# Patient Record
Sex: Female | Born: 1940 | Race: Black or African American | Hispanic: No | Smoking: Former smoker
Health system: Southern US, Community
[De-identification: ages and names within clinical notes are randomized; demographics above are authoritative.]

## PROBLEM LIST (undated history)

## (undated) DIAGNOSIS — K219 Gastro-esophageal reflux disease without esophagitis: Secondary | ICD-10-CM

## (undated) DIAGNOSIS — C801 Malignant (primary) neoplasm, unspecified: Secondary | ICD-10-CM

## (undated) DIAGNOSIS — M25512 Pain in left shoulder: Secondary | ICD-10-CM

## (undated) DIAGNOSIS — I10 Essential (primary) hypertension: Secondary | ICD-10-CM

## (undated) DIAGNOSIS — E039 Hypothyroidism, unspecified: Secondary | ICD-10-CM

## (undated) DIAGNOSIS — I219 Acute myocardial infarction, unspecified: Secondary | ICD-10-CM

## (undated) DIAGNOSIS — E119 Type 2 diabetes mellitus without complications: Secondary | ICD-10-CM

## (undated) DIAGNOSIS — D649 Anemia, unspecified: Secondary | ICD-10-CM

## (undated) DIAGNOSIS — I2781 Cor pulmonale (chronic): Secondary | ICD-10-CM

## (undated) DIAGNOSIS — Z955 Presence of coronary angioplasty implant and graft: Secondary | ICD-10-CM

## (undated) DIAGNOSIS — Z85850 Personal history of malignant neoplasm of thyroid: Secondary | ICD-10-CM

## (undated) DIAGNOSIS — R911 Solitary pulmonary nodule: Secondary | ICD-10-CM

## (undated) DIAGNOSIS — E7800 Pure hypercholesterolemia, unspecified: Secondary | ICD-10-CM

## (undated) DIAGNOSIS — I2510 Atherosclerotic heart disease of native coronary artery without angina pectoris: Secondary | ICD-10-CM

## (undated) DIAGNOSIS — K2970 Gastritis, unspecified, without bleeding: Secondary | ICD-10-CM

## (undated) DIAGNOSIS — G5600 Carpal tunnel syndrome, unspecified upper limb: Secondary | ICD-10-CM

## (undated) DIAGNOSIS — D509 Iron deficiency anemia, unspecified: Secondary | ICD-10-CM

## (undated) DIAGNOSIS — H6980 Other specified disorders of Eustachian tube, unspecified ear: Secondary | ICD-10-CM

## (undated) DIAGNOSIS — R0789 Other chest pain: Secondary | ICD-10-CM

## (undated) DIAGNOSIS — I700 Atherosclerosis of aorta: Secondary | ICD-10-CM

## (undated) DIAGNOSIS — Z780 Asymptomatic menopausal state: Secondary | ICD-10-CM

## (undated) HISTORY — DX: Carpal tunnel syndrome, unspecified upper limb: G56.00

## (undated) HISTORY — PX: BREAST BIOPSY: SHX20

## (undated) HISTORY — DX: Essential (primary) hypertension: I10

## (undated) HISTORY — PX: BREAST EXCISIONAL BIOPSY: SUR124

## (undated) HISTORY — DX: Asymptomatic menopausal state: Z78.0

## (undated) HISTORY — DX: Personal history of malignant neoplasm of thyroid: Z85.850

## (undated) HISTORY — DX: Pure hypercholesterolemia, unspecified: E78.00

## (undated) HISTORY — DX: Hypothyroidism, unspecified: E03.9

## (undated) HISTORY — DX: Other specified disorders of Eustachian tube, unspecified ear: H69.80

## (undated) HISTORY — PX: CORONARY ANGIOPLASTY: SHX604

## (undated) HISTORY — DX: Malignant (primary) neoplasm, unspecified: C80.1

## (undated) HISTORY — DX: Other chest pain: R07.89

## (undated) HISTORY — DX: Anemia, unspecified: D64.9

## (undated) HISTORY — DX: Type 2 diabetes mellitus without complications: E11.9

## (undated) HISTORY — DX: Iron deficiency anemia, unspecified: D50.9

## (undated) HISTORY — PX: TUBAL LIGATION: SHX77

## (undated) HISTORY — PX: TONSILLECTOMY: SUR1361

## (undated) HISTORY — PX: ANGIOPLASTY: SHX39

## (undated) HISTORY — DX: Cor pulmonale (chronic): I27.81

## (undated) HISTORY — PX: PTCA: SHX146

## (undated) HISTORY — DX: Gastritis, unspecified, without bleeding: K29.70

---

## 1990-12-23 DIAGNOSIS — Z85850 Personal history of malignant neoplasm of thyroid: Secondary | ICD-10-CM

## 1990-12-23 HISTORY — DX: Personal history of malignant neoplasm of thyroid: Z85.850

## 1991-10-06 HISTORY — PX: THYROIDECTOMY: SHX17

## 1998-02-20 HISTORY — PX: ENDOMETRIAL BIOPSY: SHX622

## 1998-06-23 ENCOUNTER — Encounter: Admission: RE | Admit: 1998-06-23 | Discharge: 1998-06-23 | Payer: Self-pay | Admitting: Family Medicine

## 1998-06-27 ENCOUNTER — Encounter: Admission: RE | Admit: 1998-06-27 | Discharge: 1998-06-27 | Payer: Self-pay | Admitting: Family Medicine

## 1998-07-04 ENCOUNTER — Encounter: Admission: RE | Admit: 1998-07-04 | Discharge: 1998-10-02 | Payer: Self-pay | Admitting: *Deleted

## 1998-10-02 ENCOUNTER — Encounter: Admission: RE | Admit: 1998-10-02 | Discharge: 1998-10-02 | Payer: Self-pay | Admitting: Family Medicine

## 1998-11-03 ENCOUNTER — Encounter: Admission: RE | Admit: 1998-11-03 | Discharge: 1998-11-03 | Payer: Self-pay | Admitting: Family Medicine

## 1998-12-11 ENCOUNTER — Encounter: Admission: RE | Admit: 1998-12-11 | Discharge: 1998-12-11 | Payer: Self-pay | Admitting: Sports Medicine

## 1999-02-28 ENCOUNTER — Encounter: Admission: RE | Admit: 1999-02-28 | Discharge: 1999-02-28 | Payer: Self-pay | Admitting: Family Medicine

## 1999-03-05 ENCOUNTER — Encounter: Admission: RE | Admit: 1999-03-05 | Discharge: 1999-03-05 | Payer: Self-pay | Admitting: Sports Medicine

## 1999-04-04 ENCOUNTER — Encounter: Admission: RE | Admit: 1999-04-04 | Discharge: 1999-04-04 | Payer: Self-pay | Admitting: Family Medicine

## 1999-04-09 ENCOUNTER — Encounter: Admission: RE | Admit: 1999-04-09 | Discharge: 1999-04-09 | Payer: Self-pay | Admitting: Sports Medicine

## 1999-04-10 ENCOUNTER — Encounter: Admission: RE | Admit: 1999-04-10 | Discharge: 1999-04-10 | Payer: Self-pay | Admitting: Family Medicine

## 1999-05-08 ENCOUNTER — Encounter: Admission: RE | Admit: 1999-05-08 | Discharge: 1999-05-08 | Payer: Self-pay | Admitting: Sports Medicine

## 1999-06-11 ENCOUNTER — Encounter: Admission: RE | Admit: 1999-06-11 | Discharge: 1999-06-11 | Payer: Self-pay | Admitting: Family Medicine

## 1999-07-03 ENCOUNTER — Encounter: Admission: RE | Admit: 1999-07-03 | Discharge: 1999-07-03 | Payer: Self-pay | Admitting: Sports Medicine

## 1999-08-22 ENCOUNTER — Encounter: Admission: RE | Admit: 1999-08-22 | Discharge: 1999-08-22 | Payer: Self-pay | Admitting: Family Medicine

## 1999-08-25 ENCOUNTER — Inpatient Hospital Stay (HOSPITAL_COMMUNITY): Admission: EM | Admit: 1999-08-25 | Discharge: 1999-08-29 | Payer: Self-pay | Admitting: Emergency Medicine

## 1999-08-25 ENCOUNTER — Encounter: Payer: Self-pay | Admitting: Emergency Medicine

## 1999-09-06 ENCOUNTER — Encounter: Admission: RE | Admit: 1999-09-06 | Discharge: 1999-09-06 | Payer: Self-pay | Admitting: Family Medicine

## 1999-09-12 ENCOUNTER — Encounter: Admission: RE | Admit: 1999-09-12 | Discharge: 1999-09-12 | Payer: Self-pay | Admitting: Family Medicine

## 1999-09-27 ENCOUNTER — Encounter: Admission: RE | Admit: 1999-09-27 | Discharge: 1999-09-27 | Payer: Self-pay | Admitting: Family Medicine

## 1999-10-03 ENCOUNTER — Encounter: Admission: RE | Admit: 1999-10-03 | Discharge: 1999-10-03 | Payer: Self-pay | Admitting: Family Medicine

## 1999-10-08 ENCOUNTER — Encounter: Admission: RE | Admit: 1999-10-08 | Discharge: 1999-10-08 | Payer: Self-pay | Admitting: Family Medicine

## 1999-10-18 ENCOUNTER — Encounter: Admission: RE | Admit: 1999-10-18 | Discharge: 1999-10-18 | Payer: Self-pay | Admitting: Family Medicine

## 1999-10-22 ENCOUNTER — Encounter: Admission: RE | Admit: 1999-10-22 | Discharge: 1999-10-22 | Payer: Self-pay | Admitting: Family Medicine

## 1999-10-22 ENCOUNTER — Encounter: Payer: Self-pay | Admitting: Family Medicine

## 1999-11-06 ENCOUNTER — Encounter: Admission: RE | Admit: 1999-11-06 | Discharge: 1999-11-06 | Payer: Self-pay | Admitting: Sports Medicine

## 1999-12-11 ENCOUNTER — Encounter: Admission: RE | Admit: 1999-12-11 | Discharge: 1999-12-11 | Payer: Self-pay | Admitting: Sports Medicine

## 2000-01-01 ENCOUNTER — Encounter: Admission: RE | Admit: 2000-01-01 | Discharge: 2000-01-01 | Payer: Self-pay | Admitting: Sports Medicine

## 2000-01-23 ENCOUNTER — Encounter: Admission: RE | Admit: 2000-01-23 | Discharge: 2000-01-23 | Payer: Self-pay | Admitting: Family Medicine

## 2000-02-02 ENCOUNTER — Emergency Department (HOSPITAL_COMMUNITY): Admission: EM | Admit: 2000-02-02 | Discharge: 2000-02-02 | Payer: Self-pay | Admitting: Emergency Medicine

## 2000-02-02 ENCOUNTER — Encounter: Payer: Self-pay | Admitting: Emergency Medicine

## 2000-04-14 ENCOUNTER — Emergency Department (HOSPITAL_COMMUNITY): Admission: EM | Admit: 2000-04-14 | Discharge: 2000-04-14 | Payer: Self-pay | Admitting: Emergency Medicine

## 2000-04-14 ENCOUNTER — Encounter: Payer: Self-pay | Admitting: Emergency Medicine

## 2000-04-21 ENCOUNTER — Ambulatory Visit (HOSPITAL_COMMUNITY): Admission: RE | Admit: 2000-04-21 | Discharge: 2000-04-21 | Payer: Self-pay | Admitting: Sports Medicine

## 2000-04-21 ENCOUNTER — Encounter: Admission: RE | Admit: 2000-04-21 | Discharge: 2000-04-21 | Payer: Self-pay | Admitting: Sports Medicine

## 2000-06-20 ENCOUNTER — Encounter: Admission: RE | Admit: 2000-06-20 | Discharge: 2000-06-20 | Payer: Self-pay | Admitting: Family Medicine

## 2000-06-23 ENCOUNTER — Encounter: Admission: RE | Admit: 2000-06-23 | Discharge: 2000-06-23 | Payer: Self-pay | Admitting: *Deleted

## 2000-06-23 ENCOUNTER — Encounter: Payer: Self-pay | Admitting: Family Medicine

## 2000-07-28 ENCOUNTER — Encounter: Admission: RE | Admit: 2000-07-28 | Discharge: 2000-07-28 | Payer: Self-pay | Admitting: Family Medicine

## 2000-09-10 ENCOUNTER — Encounter: Admission: RE | Admit: 2000-09-10 | Discharge: 2000-09-10 | Payer: Self-pay | Admitting: Family Medicine

## 2000-09-10 ENCOUNTER — Ambulatory Visit (HOSPITAL_COMMUNITY): Admission: RE | Admit: 2000-09-10 | Discharge: 2000-09-10 | Payer: Self-pay | Admitting: Family Medicine

## 2000-09-18 ENCOUNTER — Encounter: Admission: RE | Admit: 2000-09-18 | Discharge: 2000-09-18 | Payer: Self-pay | Admitting: Family Medicine

## 2000-09-26 ENCOUNTER — Encounter: Admission: RE | Admit: 2000-09-26 | Discharge: 2000-10-22 | Payer: Self-pay | Admitting: Sports Medicine

## 2000-10-22 ENCOUNTER — Encounter: Admission: RE | Admit: 2000-10-22 | Discharge: 2000-10-22 | Payer: Self-pay | Admitting: Family Medicine

## 2000-11-16 ENCOUNTER — Encounter: Payer: Self-pay | Admitting: Emergency Medicine

## 2000-11-16 ENCOUNTER — Inpatient Hospital Stay (HOSPITAL_COMMUNITY): Admission: EM | Admit: 2000-11-16 | Discharge: 2000-11-20 | Payer: Self-pay | Admitting: Emergency Medicine

## 2000-11-26 ENCOUNTER — Encounter: Admission: RE | Admit: 2000-11-26 | Discharge: 2000-11-26 | Payer: Self-pay | Admitting: Family Medicine

## 2000-12-10 ENCOUNTER — Encounter: Payer: Self-pay | Admitting: Family Medicine

## 2000-12-10 ENCOUNTER — Inpatient Hospital Stay (HOSPITAL_COMMUNITY): Admission: EM | Admit: 2000-12-10 | Discharge: 2000-12-11 | Payer: Self-pay | Admitting: Emergency Medicine

## 2000-12-18 ENCOUNTER — Encounter: Admission: RE | Admit: 2000-12-18 | Discharge: 2000-12-18 | Payer: Self-pay | Admitting: Family Medicine

## 2001-02-16 ENCOUNTER — Encounter: Admission: RE | Admit: 2001-02-16 | Discharge: 2001-02-16 | Payer: Self-pay | Admitting: Family Medicine

## 2001-04-14 ENCOUNTER — Encounter: Admission: RE | Admit: 2001-04-14 | Discharge: 2001-04-14 | Payer: Self-pay | Admitting: Sports Medicine

## 2001-04-23 ENCOUNTER — Encounter: Admission: RE | Admit: 2001-04-23 | Discharge: 2001-04-23 | Payer: Self-pay | Admitting: Sports Medicine

## 2001-04-23 ENCOUNTER — Encounter: Payer: Self-pay | Admitting: Sports Medicine

## 2001-05-15 ENCOUNTER — Encounter: Admission: RE | Admit: 2001-05-15 | Discharge: 2001-05-15 | Payer: Self-pay | Admitting: Family Medicine

## 2001-06-02 ENCOUNTER — Encounter (HOSPITAL_COMMUNITY): Admission: RE | Admit: 2001-06-02 | Discharge: 2001-08-31 | Payer: Self-pay | Admitting: Cardiovascular Disease

## 2001-06-15 ENCOUNTER — Encounter: Admission: RE | Admit: 2001-06-15 | Discharge: 2001-06-15 | Payer: Self-pay | Admitting: Family Medicine

## 2001-06-23 ENCOUNTER — Inpatient Hospital Stay (HOSPITAL_COMMUNITY): Admission: EM | Admit: 2001-06-23 | Discharge: 2001-06-24 | Payer: Self-pay

## 2001-07-07 ENCOUNTER — Encounter: Admission: RE | Admit: 2001-07-07 | Discharge: 2001-07-07 | Payer: Self-pay | Admitting: Family Medicine

## 2001-08-07 ENCOUNTER — Encounter: Admission: RE | Admit: 2001-08-07 | Discharge: 2001-08-07 | Payer: Self-pay | Admitting: Family Medicine

## 2001-09-07 ENCOUNTER — Encounter: Admission: RE | Admit: 2001-09-07 | Discharge: 2001-09-07 | Payer: Self-pay | Admitting: Family Medicine

## 2001-12-25 ENCOUNTER — Encounter: Admission: RE | Admit: 2001-12-25 | Discharge: 2001-12-25 | Payer: Self-pay | Admitting: Family Medicine

## 2001-12-25 ENCOUNTER — Ambulatory Visit (HOSPITAL_COMMUNITY): Admission: RE | Admit: 2001-12-25 | Discharge: 2001-12-25 | Payer: Self-pay | Admitting: Family Medicine

## 2002-01-14 ENCOUNTER — Encounter: Admission: RE | Admit: 2002-01-14 | Discharge: 2002-01-14 | Payer: Self-pay | Admitting: Family Medicine

## 2002-02-15 ENCOUNTER — Encounter: Admission: RE | Admit: 2002-02-15 | Discharge: 2002-02-15 | Payer: Self-pay | Admitting: Family Medicine

## 2002-04-27 ENCOUNTER — Encounter: Admission: RE | Admit: 2002-04-27 | Discharge: 2002-04-27 | Payer: Self-pay | Admitting: Sports Medicine

## 2002-04-27 ENCOUNTER — Encounter: Payer: Self-pay | Admitting: Internal Medicine

## 2002-04-27 ENCOUNTER — Encounter: Admission: RE | Admit: 2002-04-27 | Discharge: 2002-04-27 | Payer: Self-pay | Admitting: Internal Medicine

## 2002-05-04 ENCOUNTER — Encounter: Admission: RE | Admit: 2002-05-04 | Discharge: 2002-05-04 | Payer: Self-pay | Admitting: Family Medicine

## 2002-05-27 ENCOUNTER — Encounter: Admission: RE | Admit: 2002-05-27 | Discharge: 2002-05-27 | Payer: Self-pay | Admitting: Family Medicine

## 2002-06-10 ENCOUNTER — Encounter: Admission: RE | Admit: 2002-06-10 | Discharge: 2002-06-10 | Payer: Self-pay | Admitting: Family Medicine

## 2002-06-17 ENCOUNTER — Emergency Department (HOSPITAL_COMMUNITY): Admission: EM | Admit: 2002-06-17 | Discharge: 2002-06-18 | Payer: Self-pay | Admitting: Emergency Medicine

## 2002-06-18 ENCOUNTER — Encounter: Payer: Self-pay | Admitting: Emergency Medicine

## 2002-07-07 ENCOUNTER — Encounter: Admission: RE | Admit: 2002-07-07 | Discharge: 2002-07-07 | Payer: Self-pay | Admitting: Family Medicine

## 2002-07-27 ENCOUNTER — Ambulatory Visit (HOSPITAL_COMMUNITY): Admission: RE | Admit: 2002-07-27 | Discharge: 2002-07-27 | Payer: Self-pay | Admitting: Cardiovascular Disease

## 2002-08-23 HISTORY — PX: CHOLECYSTECTOMY: SHX55

## 2002-08-26 ENCOUNTER — Encounter: Payer: Self-pay | Admitting: Surgery

## 2002-08-30 ENCOUNTER — Encounter (INDEPENDENT_AMBULATORY_CARE_PROVIDER_SITE_OTHER): Payer: Self-pay | Admitting: *Deleted

## 2002-08-30 ENCOUNTER — Ambulatory Visit (HOSPITAL_COMMUNITY): Admission: RE | Admit: 2002-08-30 | Discharge: 2002-08-31 | Payer: Self-pay | Admitting: Surgery

## 2002-09-27 ENCOUNTER — Emergency Department (HOSPITAL_COMMUNITY): Admission: EM | Admit: 2002-09-27 | Discharge: 2002-09-28 | Payer: Self-pay | Admitting: Emergency Medicine

## 2002-09-28 ENCOUNTER — Encounter: Payer: Self-pay | Admitting: Emergency Medicine

## 2002-12-30 ENCOUNTER — Encounter: Admission: RE | Admit: 2002-12-30 | Discharge: 2002-12-30 | Payer: Self-pay | Admitting: Sports Medicine

## 2003-03-06 ENCOUNTER — Inpatient Hospital Stay (HOSPITAL_COMMUNITY): Admission: EM | Admit: 2003-03-06 | Discharge: 2003-03-08 | Payer: Self-pay | Admitting: Emergency Medicine

## 2003-03-14 ENCOUNTER — Encounter: Admission: RE | Admit: 2003-03-14 | Discharge: 2003-03-14 | Payer: Self-pay | Admitting: Family Medicine

## 2003-04-15 ENCOUNTER — Encounter: Admission: RE | Admit: 2003-04-15 | Discharge: 2003-04-15 | Payer: Self-pay | Admitting: Family Medicine

## 2003-05-19 ENCOUNTER — Encounter: Admission: RE | Admit: 2003-05-19 | Discharge: 2003-05-19 | Payer: Self-pay | Admitting: Sports Medicine

## 2003-05-19 ENCOUNTER — Encounter: Payer: Self-pay | Admitting: Sports Medicine

## 2003-07-24 LAB — HM COLONOSCOPY

## 2003-08-09 ENCOUNTER — Ambulatory Visit (HOSPITAL_COMMUNITY): Admission: RE | Admit: 2003-08-09 | Discharge: 2003-08-09 | Payer: Self-pay | Admitting: Gastroenterology

## 2003-10-19 ENCOUNTER — Encounter: Admission: RE | Admit: 2003-10-19 | Discharge: 2003-10-19 | Payer: Self-pay | Admitting: Family Medicine

## 2003-11-01 ENCOUNTER — Encounter: Admission: RE | Admit: 2003-11-01 | Discharge: 2003-11-01 | Payer: Self-pay | Admitting: Sports Medicine

## 2004-05-14 ENCOUNTER — Encounter: Admission: RE | Admit: 2004-05-14 | Discharge: 2004-05-14 | Payer: Self-pay | Admitting: Family Medicine

## 2004-05-25 ENCOUNTER — Encounter: Admission: RE | Admit: 2004-05-25 | Discharge: 2004-05-25 | Payer: Self-pay | Admitting: Sports Medicine

## 2004-09-06 ENCOUNTER — Ambulatory Visit: Payer: Self-pay | Admitting: Family Medicine

## 2004-09-13 ENCOUNTER — Ambulatory Visit: Payer: Self-pay | Admitting: Family Medicine

## 2004-09-13 HISTORY — PX: BARTHOLIN GLAND CYST EXCISION: SHX565

## 2004-11-02 ENCOUNTER — Ambulatory Visit: Payer: Self-pay | Admitting: Family Medicine

## 2005-02-26 ENCOUNTER — Ambulatory Visit: Payer: Self-pay | Admitting: Family Medicine

## 2005-03-29 ENCOUNTER — Ambulatory Visit: Payer: Self-pay | Admitting: Cardiovascular Disease

## 2005-04-25 ENCOUNTER — Ambulatory Visit: Payer: Self-pay | Admitting: Family Medicine

## 2005-06-27 ENCOUNTER — Encounter (INDEPENDENT_AMBULATORY_CARE_PROVIDER_SITE_OTHER): Payer: Self-pay | Admitting: *Deleted

## 2005-06-27 LAB — CONVERTED CEMR LAB

## 2005-07-01 ENCOUNTER — Ambulatory Visit: Payer: Self-pay | Admitting: Sports Medicine

## 2005-07-15 ENCOUNTER — Encounter: Admission: RE | Admit: 2005-07-15 | Discharge: 2005-07-15 | Payer: Self-pay | Admitting: Sports Medicine

## 2005-10-07 ENCOUNTER — Ambulatory Visit: Payer: Self-pay | Admitting: Family Medicine

## 2005-12-29 ENCOUNTER — Emergency Department (HOSPITAL_COMMUNITY): Admission: EM | Admit: 2005-12-29 | Discharge: 2005-12-29 | Payer: Self-pay | Admitting: Emergency Medicine

## 2006-01-07 ENCOUNTER — Ambulatory Visit: Payer: Self-pay | Admitting: Family Medicine

## 2006-03-31 ENCOUNTER — Ambulatory Visit: Payer: Self-pay | Admitting: Cardiovascular Disease

## 2006-04-11 ENCOUNTER — Ambulatory Visit: Payer: Self-pay

## 2006-07-17 ENCOUNTER — Encounter: Admission: RE | Admit: 2006-07-17 | Discharge: 2006-07-17 | Payer: Self-pay | Admitting: Internal Medicine

## 2006-08-11 ENCOUNTER — Ambulatory Visit: Payer: Self-pay | Admitting: Family Medicine

## 2006-10-13 ENCOUNTER — Ambulatory Visit: Payer: Self-pay | Admitting: Family Medicine

## 2006-11-06 ENCOUNTER — Ambulatory Visit: Payer: Self-pay | Admitting: Sports Medicine

## 2006-11-10 ENCOUNTER — Ambulatory Visit: Payer: Self-pay | Admitting: Family Medicine

## 2006-11-19 ENCOUNTER — Encounter: Payer: Self-pay | Admitting: Cardiovascular Disease

## 2006-11-19 ENCOUNTER — Ambulatory Visit (HOSPITAL_COMMUNITY): Admission: RE | Admit: 2006-11-19 | Discharge: 2006-11-19 | Payer: Self-pay | Admitting: Sports Medicine

## 2006-11-19 ENCOUNTER — Ambulatory Visit: Payer: Self-pay | Admitting: Cardiovascular Disease

## 2006-12-10 ENCOUNTER — Ambulatory Visit: Payer: Self-pay | Admitting: Family Medicine

## 2007-02-17 ENCOUNTER — Ambulatory Visit: Payer: Self-pay | Admitting: Family Medicine

## 2007-02-17 ENCOUNTER — Encounter: Payer: Self-pay | Admitting: Family Medicine

## 2007-02-17 LAB — CONVERTED CEMR LAB
ALT: 22 units/L (ref 0–35)
AST: 17 units/L (ref 0–37)
Albumin: 4.5 g/dL (ref 3.5–5.2)
Alkaline Phosphatase: 64 units/L (ref 39–117)
BUN: 21 mg/dL (ref 6–23)
CO2: 29 meq/L (ref 19–32)
Calcium: 9.7 mg/dL (ref 8.4–10.5)
Chloride: 100 meq/L (ref 96–112)
Cholesterol: 115 mg/dL (ref 0–200)
Creatinine, Ser: 0.77 mg/dL (ref 0.40–1.20)
Glucose, Bld: 182 mg/dL — ABNORMAL HIGH (ref 70–99)
HDL: 41 mg/dL (ref >39)
LDL Cholesterol: 55 mg/dL (ref 0–99)
Potassium: 4.7 meq/L (ref 3.5–5.3)
Sodium: 141 meq/L (ref 135–145)
Total Bilirubin: 0.5 mg/dL (ref 0.3–1.2)
Total CHOL/HDL Ratio: 2.8
Total Protein: 7.3 g/dL (ref 6.0–8.3)
Triglycerides: 95 mg/dL (ref <150)
VLDL: 19 mg/dL (ref 0–40)

## 2007-02-19 DIAGNOSIS — D638 Anemia in other chronic diseases classified elsewhere: Secondary | ICD-10-CM | POA: Insufficient documentation

## 2007-02-19 DIAGNOSIS — E039 Hypothyroidism, unspecified: Secondary | ICD-10-CM | POA: Insufficient documentation

## 2007-02-19 DIAGNOSIS — I10 Essential (primary) hypertension: Secondary | ICD-10-CM | POA: Insufficient documentation

## 2007-02-19 DIAGNOSIS — I2510 Atherosclerotic heart disease of native coronary artery without angina pectoris: Secondary | ICD-10-CM

## 2007-02-19 DIAGNOSIS — I270 Primary pulmonary hypertension: Secondary | ICD-10-CM | POA: Insufficient documentation

## 2007-02-19 DIAGNOSIS — D649 Anemia, unspecified: Secondary | ICD-10-CM

## 2007-02-19 DIAGNOSIS — E1129 Type 2 diabetes mellitus with other diabetic kidney complication: Secondary | ICD-10-CM | POA: Insufficient documentation

## 2007-02-19 DIAGNOSIS — G5600 Carpal tunnel syndrome, unspecified upper limb: Secondary | ICD-10-CM

## 2007-02-19 DIAGNOSIS — E7800 Pure hypercholesterolemia, unspecified: Secondary | ICD-10-CM

## 2007-02-19 DIAGNOSIS — E119 Type 2 diabetes mellitus without complications: Secondary | ICD-10-CM

## 2007-02-19 HISTORY — DX: Carpal tunnel syndrome, unspecified upper limb: G56.00

## 2007-02-19 HISTORY — DX: Hypothyroidism, unspecified: E03.9

## 2007-02-20 ENCOUNTER — Ambulatory Visit: Payer: Self-pay | Admitting: Family Medicine

## 2007-02-20 ENCOUNTER — Encounter (INDEPENDENT_AMBULATORY_CARE_PROVIDER_SITE_OTHER): Payer: Self-pay | Admitting: *Deleted

## 2007-04-01 ENCOUNTER — Ambulatory Visit: Payer: Self-pay | Admitting: Cardiovascular Disease

## 2007-04-02 ENCOUNTER — Encounter: Payer: Self-pay | Admitting: Family Medicine

## 2007-04-21 ENCOUNTER — Ambulatory Visit: Payer: Self-pay

## 2007-08-05 ENCOUNTER — Encounter: Admission: RE | Admit: 2007-08-05 | Discharge: 2007-08-05 | Payer: Self-pay | Admitting: Internal Medicine

## 2007-08-11 ENCOUNTER — Telehealth (INDEPENDENT_AMBULATORY_CARE_PROVIDER_SITE_OTHER): Payer: Self-pay | Admitting: *Deleted

## 2007-08-14 ENCOUNTER — Encounter: Payer: Self-pay | Admitting: *Deleted

## 2007-09-04 ENCOUNTER — Ambulatory Visit: Payer: Self-pay | Admitting: Family Medicine

## 2007-09-04 ENCOUNTER — Encounter (INDEPENDENT_AMBULATORY_CARE_PROVIDER_SITE_OTHER): Payer: Self-pay | Admitting: *Deleted

## 2007-09-04 LAB — CONVERTED CEMR LAB
Bilirubin Urine: NEGATIVE
Glucose, Urine, Semiquant: NEGATIVE
Hgb A1c MFr Bld: 7.5 %
Ketones, urine, test strip: NEGATIVE
Nitrite: NEGATIVE
Specific Gravity, Urine: >=1.03
Urobilinogen, UA: 0.2
pH: 5.5

## 2007-09-08 ENCOUNTER — Encounter (INDEPENDENT_AMBULATORY_CARE_PROVIDER_SITE_OTHER): Payer: Self-pay | Admitting: *Deleted

## 2007-09-08 LAB — CONVERTED CEMR LAB
Basophils Absolute: 0 10*3/uL (ref 0.0–0.1)
Basophils Relative: 1 % (ref 0–1)
Eosinophils Absolute: 0.2 10*3/uL (ref 0.0–0.7)
Eosinophils Relative: 3 % (ref 0–5)
HCT: 36.6 % (ref 36.0–46.0)
Hemoglobin: 11.3 g/dL — ABNORMAL LOW (ref 12.0–15.0)
Lymphocytes Relative: 41 % (ref 12–46)
Lymphs Abs: 2.7 10*3/uL (ref 0.7–3.3)
MCHC: 30.9 g/dL (ref 30.0–36.0)
MCV: 82.1 fL (ref 78.0–100.0)
Monocytes Absolute: 0.5 10*3/uL (ref 0.2–0.7)
Monocytes Relative: 7 % (ref 3–11)
Neutro Abs: 3.2 10*3/uL (ref 1.7–7.7)
Neutrophils Relative %: 49 % (ref 43–77)
Platelets: 197 10*3/uL (ref 150–400)
RBC: 4.46 M/uL (ref 3.87–5.11)
RDW: 16.1 % — ABNORMAL HIGH (ref 11.5–14.0)
TSH: 0.324 microintl units/mL — ABNORMAL LOW (ref 0.350–5.50)
WBC: 6.6 10*3/uL (ref 4.0–10.5)

## 2007-09-10 ENCOUNTER — Encounter: Payer: Self-pay | Admitting: Family Medicine

## 2007-09-11 ENCOUNTER — Encounter: Payer: Self-pay | Admitting: Family Medicine

## 2007-10-08 ENCOUNTER — Ambulatory Visit: Payer: Self-pay | Admitting: Family Medicine

## 2007-10-22 ENCOUNTER — Ambulatory Visit: Payer: Self-pay | Admitting: Family Medicine

## 2007-11-18 ENCOUNTER — Ambulatory Visit: Payer: Self-pay | Admitting: Family Medicine

## 2007-11-19 ENCOUNTER — Encounter (INDEPENDENT_AMBULATORY_CARE_PROVIDER_SITE_OTHER): Payer: Self-pay | Admitting: *Deleted

## 2007-12-03 ENCOUNTER — Ambulatory Visit: Payer: Self-pay | Admitting: Family Medicine

## 2007-12-03 ENCOUNTER — Encounter (INDEPENDENT_AMBULATORY_CARE_PROVIDER_SITE_OTHER): Payer: Self-pay | Admitting: *Deleted

## 2007-12-03 LAB — CONVERTED CEMR LAB: TSH: 1.056 microintl units/mL (ref 0.350–5.50)

## 2007-12-04 ENCOUNTER — Encounter (INDEPENDENT_AMBULATORY_CARE_PROVIDER_SITE_OTHER): Payer: Self-pay | Admitting: *Deleted

## 2007-12-21 ENCOUNTER — Emergency Department (HOSPITAL_COMMUNITY): Admission: EM | Admit: 2007-12-21 | Discharge: 2007-12-21 | Payer: Self-pay | Admitting: Emergency Medicine

## 2008-01-18 ENCOUNTER — Encounter (INDEPENDENT_AMBULATORY_CARE_PROVIDER_SITE_OTHER): Payer: Self-pay | Admitting: *Deleted

## 2008-01-18 ENCOUNTER — Ambulatory Visit: Payer: Self-pay | Admitting: Family Medicine

## 2008-01-18 LAB — CONVERTED CEMR LAB
Hgb A1c MFr Bld: 8.2 %
Pap Smear: NORMAL

## 2008-01-18 LAB — HM PAP SMEAR

## 2008-01-20 ENCOUNTER — Encounter (INDEPENDENT_AMBULATORY_CARE_PROVIDER_SITE_OTHER): Payer: Self-pay | Admitting: *Deleted

## 2008-01-22 ENCOUNTER — Emergency Department (HOSPITAL_COMMUNITY): Admission: EM | Admit: 2008-01-22 | Discharge: 2008-01-22 | Payer: Self-pay | Admitting: Family Medicine

## 2008-06-16 ENCOUNTER — Telehealth: Payer: Self-pay | Admitting: *Deleted

## 2008-06-16 ENCOUNTER — Ambulatory Visit: Payer: Self-pay | Admitting: Family Medicine

## 2008-06-20 ENCOUNTER — Telehealth: Payer: Self-pay | Admitting: Sports Medicine

## 2008-06-20 ENCOUNTER — Telehealth: Payer: Self-pay | Admitting: *Deleted

## 2008-06-21 ENCOUNTER — Ambulatory Visit: Payer: Self-pay | Admitting: Family Medicine

## 2008-06-27 ENCOUNTER — Telehealth: Payer: Self-pay | Admitting: *Deleted

## 2008-06-27 ENCOUNTER — Ambulatory Visit: Payer: Self-pay | Admitting: Family Medicine

## 2008-06-29 ENCOUNTER — Ambulatory Visit: Payer: Self-pay | Admitting: Family Medicine

## 2008-09-12 ENCOUNTER — Encounter (INDEPENDENT_AMBULATORY_CARE_PROVIDER_SITE_OTHER): Payer: Self-pay | Admitting: *Deleted

## 2008-10-07 LAB — HM DIABETES EYE EXAM: HM Diabetic Eye Exam: NORMAL

## 2008-10-11 ENCOUNTER — Ambulatory Visit: Payer: Self-pay | Admitting: Family Medicine

## 2008-10-11 ENCOUNTER — Encounter: Admission: RE | Admit: 2008-10-11 | Discharge: 2008-10-11 | Payer: Self-pay | Admitting: Sports Medicine

## 2008-10-11 ENCOUNTER — Encounter: Payer: Self-pay | Admitting: Sports Medicine

## 2008-10-11 DIAGNOSIS — Z780 Asymptomatic menopausal state: Secondary | ICD-10-CM | POA: Insufficient documentation

## 2008-10-11 LAB — CONVERTED CEMR LAB
BUN: 16 mg/dL (ref 6–23)
CO2: 30 meq/L (ref 19–32)
Calcium: 9.5 mg/dL (ref 8.4–10.5)
Chloride: 102 meq/L (ref 96–112)
Creatinine, Ser: 0.68 mg/dL (ref 0.40–1.20)
Glucose, Bld: 115 mg/dL — ABNORMAL HIGH (ref 70–99)
Hgb A1c MFr Bld: 7.8 %
Potassium: 3.6 meq/L (ref 3.5–5.3)
Sodium: 143 meq/L (ref 135–145)

## 2008-12-12 ENCOUNTER — Encounter: Payer: Self-pay | Admitting: Sports Medicine

## 2008-12-19 ENCOUNTER — Encounter: Payer: Self-pay | Admitting: Sports Medicine

## 2008-12-19 ENCOUNTER — Ambulatory Visit: Payer: Self-pay | Admitting: Family Medicine

## 2008-12-20 LAB — CONVERTED CEMR LAB
Cholesterol: 132 mg/dL (ref 0–200)
HDL: 50 mg/dL (ref >39)
LDL Cholesterol: 65 mg/dL (ref 0–99)
Total CHOL/HDL Ratio: 2.6
Triglycerides: 83 mg/dL (ref <150)
VLDL: 17 mg/dL (ref 0–40)

## 2009-01-10 ENCOUNTER — Ambulatory Visit: Payer: Self-pay | Admitting: Family Medicine

## 2009-01-10 LAB — CONVERTED CEMR LAB: Hgb A1c MFr Bld: 7.8 %

## 2009-03-13 ENCOUNTER — Inpatient Hospital Stay (HOSPITAL_COMMUNITY): Admission: EM | Admit: 2009-03-13 | Discharge: 2009-03-16 | Payer: Self-pay | Admitting: Emergency Medicine

## 2009-03-13 ENCOUNTER — Ambulatory Visit: Payer: Self-pay | Admitting: *Deleted

## 2009-03-15 HISTORY — PX: CARDIAC CATHETERIZATION: SHX172

## 2009-03-23 ENCOUNTER — Encounter (HOSPITAL_COMMUNITY): Admission: RE | Admit: 2009-03-23 | Discharge: 2009-06-20 | Payer: Self-pay | Admitting: Cardiovascular Disease

## 2009-03-24 ENCOUNTER — Telehealth: Payer: Self-pay | Admitting: Family Medicine

## 2009-03-24 ENCOUNTER — Ambulatory Visit: Payer: Self-pay | Admitting: Family Medicine

## 2009-03-24 ENCOUNTER — Encounter (INDEPENDENT_AMBULATORY_CARE_PROVIDER_SITE_OTHER): Payer: Self-pay | Admitting: Family Medicine

## 2009-03-24 LAB — CONVERTED CEMR LAB
Bilirubin Urine: NEGATIVE
Glucose, Urine, Semiquant: NEGATIVE
Ketones, urine, test strip: NEGATIVE
Specific Gravity, Urine: 1.015
pH: 5.5

## 2009-03-31 ENCOUNTER — Encounter: Payer: Self-pay | Admitting: Cardiovascular Disease

## 2009-03-31 ENCOUNTER — Ambulatory Visit: Payer: Self-pay | Admitting: Cardiovascular Disease

## 2009-04-03 ENCOUNTER — Ambulatory Visit: Payer: Self-pay | Admitting: Family Medicine

## 2009-04-03 LAB — CONVERTED CEMR LAB: Hgb A1c MFr Bld: 7.2 %

## 2009-04-20 ENCOUNTER — Ambulatory Visit: Payer: Self-pay | Admitting: Family Medicine

## 2009-04-25 ENCOUNTER — Ambulatory Visit: Payer: Self-pay | Admitting: Family Medicine

## 2009-05-15 ENCOUNTER — Encounter: Payer: Self-pay | Admitting: Cardiovascular Disease

## 2009-07-05 ENCOUNTER — Encounter: Payer: Self-pay | Admitting: Sports Medicine

## 2009-07-05 ENCOUNTER — Ambulatory Visit: Payer: Self-pay | Admitting: Family Medicine

## 2009-07-05 ENCOUNTER — Ambulatory Visit: Payer: Self-pay | Admitting: Cardiovascular Disease

## 2009-07-05 LAB — CONVERTED CEMR LAB
HCT: 32 % — ABNORMAL LOW (ref 36.0–46.0)
Hemoglobin: 10.4 g/dL — ABNORMAL LOW (ref 12.0–15.0)
Hgb A1c MFr Bld: 8 %
MCHC: 32.5 g/dL (ref 30.0–36.0)
MCV: 80.4 fL (ref 78.0–100.0)
Platelets: 152 10*3/uL (ref 150–400)
RBC: 3.98 M/uL (ref 3.87–5.11)
RDW: 16 % — ABNORMAL HIGH (ref 11.5–15.5)
WBC: 5.2 10*3/uL (ref 4.0–10.5)

## 2009-07-14 ENCOUNTER — Ambulatory Visit: Payer: Self-pay | Admitting: Family Medicine

## 2009-07-17 ENCOUNTER — Encounter: Payer: Self-pay | Admitting: Sports Medicine

## 2009-07-17 ENCOUNTER — Ambulatory Visit: Payer: Self-pay | Admitting: Family Medicine

## 2009-07-18 ENCOUNTER — Encounter: Payer: Self-pay | Admitting: Sports Medicine

## 2009-07-18 LAB — CONVERTED CEMR LAB
HCT: 33.8 % — ABNORMAL LOW (ref 36.0–46.0)
Hemoglobin: 10.7 g/dL — ABNORMAL LOW (ref 12.0–15.0)
Iron: 75 ug/dL (ref 42–145)
MCHC: 31.7 g/dL (ref 30.0–36.0)
MCV: 83 fL (ref 78.0–100.0)
Platelets: 147 K/uL — ABNORMAL LOW (ref 150–400)
RBC: 4.07 M/uL (ref 3.87–5.11)
RDW: 16 % — ABNORMAL HIGH (ref 11.5–15.5)
Retic Ct Pct: 0.8 % (ref 0.4–3.1)
Saturation Ratios: 23 % (ref 20–55)
TIBC: 333 ug/dL (ref 250–470)
UIBC: 258 ug/dL
WBC: 4.8 10*3/microliter (ref 4.0–10.5)

## 2009-07-24 ENCOUNTER — Ambulatory Visit: Payer: Self-pay | Admitting: Family Medicine

## 2009-07-25 ENCOUNTER — Ambulatory Visit: Payer: Self-pay | Admitting: Family Medicine

## 2009-07-26 ENCOUNTER — Ambulatory Visit: Payer: Self-pay | Admitting: Family Medicine

## 2009-07-27 ENCOUNTER — Ambulatory Visit: Payer: Self-pay | Admitting: Family Medicine

## 2009-08-01 ENCOUNTER — Ambulatory Visit: Payer: Self-pay | Admitting: Family Medicine

## 2009-08-02 ENCOUNTER — Ambulatory Visit: Payer: Self-pay | Admitting: Family Medicine

## 2009-08-04 ENCOUNTER — Telehealth: Payer: Self-pay | Admitting: *Deleted

## 2009-09-28 ENCOUNTER — Ambulatory Visit: Payer: Self-pay | Admitting: Family Medicine

## 2009-09-28 ENCOUNTER — Encounter: Payer: Self-pay | Admitting: Sports Medicine

## 2009-09-28 LAB — CONVERTED CEMR LAB
HCT: 32.9 % — ABNORMAL LOW (ref 36.0–46.0)
Hemoglobin: 10 g/dL — ABNORMAL LOW (ref 12.0–15.0)
Hgb A1c MFr Bld: 8.2 %
MCHC: 30.4 g/dL (ref 30.0–36.0)
MCV: 84.8 fL (ref 78.0–100.0)
Platelets: 138 10*3/uL — ABNORMAL LOW (ref 150–400)
RBC: 3.88 M/uL (ref 3.87–5.11)
RDW: 15.3 % (ref 11.5–15.5)
WBC: 5.8 10*3/uL (ref 4.0–10.5)

## 2009-10-02 ENCOUNTER — Ambulatory Visit: Payer: Self-pay | Admitting: Family Medicine

## 2009-10-12 ENCOUNTER — Encounter: Admission: RE | Admit: 2009-10-12 | Discharge: 2009-10-12 | Payer: Self-pay | Admitting: Sports Medicine

## 2009-11-02 ENCOUNTER — Ambulatory Visit: Payer: Self-pay | Admitting: Family Medicine

## 2009-11-03 ENCOUNTER — Encounter (INDEPENDENT_AMBULATORY_CARE_PROVIDER_SITE_OTHER): Payer: Self-pay | Admitting: *Deleted

## 2009-11-17 ENCOUNTER — Telehealth: Payer: Self-pay | Admitting: Family Medicine

## 2009-12-06 ENCOUNTER — Telehealth: Payer: Self-pay | Admitting: Sports Medicine

## 2009-12-08 ENCOUNTER — Ambulatory Visit: Payer: Self-pay | Admitting: Family Medicine

## 2009-12-27 ENCOUNTER — Ambulatory Visit: Payer: Self-pay | Admitting: Cardiovascular Disease

## 2010-01-05 ENCOUNTER — Ambulatory Visit: Payer: Self-pay | Admitting: Cardiovascular Disease

## 2010-01-05 ENCOUNTER — Ambulatory Visit: Payer: Self-pay | Admitting: Family Medicine

## 2010-01-09 LAB — CONVERTED CEMR LAB
Free T4: 0.8 ng/dL (ref 0.6–1.6)
Hgb A1c MFr Bld: 8.2 % — ABNORMAL HIGH (ref 4.6–6.5)
TSH: 0.46 microintl units/mL (ref 0.35–5.50)

## 2010-01-10 ENCOUNTER — Encounter (INDEPENDENT_AMBULATORY_CARE_PROVIDER_SITE_OTHER): Payer: Self-pay | Admitting: *Deleted

## 2010-01-10 LAB — CONVERTED CEMR LAB
AST: 20 units/L (ref 0–37)
Albumin: 4.4 g/dL (ref 3.5–5.2)
Bilirubin, Direct: <0.1 mg/dL (ref 0.0–0.3)
CO2: 23 meq/L (ref 19–32)
Chloride: 101 meq/L (ref 96–112)
Glucose, Bld: 141 mg/dL — ABNORMAL HIGH (ref 70–99)
HDL: 47 mg/dL (ref >39)
LDL Cholesterol: 72 mg/dL (ref 0–99)
Potassium: 4.3 meq/L (ref 3.5–5.3)
Sodium: 142 meq/L (ref 135–145)
Total Bilirubin: 0.5 mg/dL (ref 0.3–1.2)
Total CHOL/HDL Ratio: 2.9
VLDL: 18 mg/dL (ref 0–40)

## 2010-02-08 ENCOUNTER — Ambulatory Visit: Payer: Self-pay | Admitting: Family Medicine

## 2010-03-07 ENCOUNTER — Ambulatory Visit: Payer: Self-pay | Admitting: Family Medicine

## 2010-03-08 ENCOUNTER — Telehealth: Payer: Self-pay | Admitting: Sports Medicine

## 2010-03-08 ENCOUNTER — Encounter: Payer: Self-pay | Admitting: Sports Medicine

## 2010-03-28 ENCOUNTER — Encounter: Payer: Self-pay | Admitting: Sports Medicine

## 2010-03-28 ENCOUNTER — Ambulatory Visit: Payer: Self-pay | Admitting: Family Medicine

## 2010-03-28 LAB — CONVERTED CEMR LAB
Albumin/Creatinine Ratio, Urine, POC: <30
Creatinine,U: 100 mg/dL
HCT: 36.9 % (ref 36.0–46.0)
Hemoglobin: 11.3 g/dL — ABNORMAL LOW (ref 12.0–15.0)
MCHC: 30.6 g/dL (ref 30.0–36.0)
MCV: 84.1 fL (ref 78.0–100.0)
Microalbumin U total vol: 30 mg/L
Platelets: 202 K/uL (ref 150–400)
RBC: 4.39 M/uL (ref 3.87–5.11)
RDW: 15.7 % — ABNORMAL HIGH (ref 11.5–15.5)
WBC: 7.3 10*3/microliter (ref 4.0–10.5)

## 2010-03-28 LAB — HM DIABETES FOOT EXAM: HM Diabetic Foot Exam: NORMAL

## 2010-03-29 ENCOUNTER — Encounter: Payer: Self-pay | Admitting: Sports Medicine

## 2010-04-09 ENCOUNTER — Encounter: Payer: Self-pay | Admitting: Sports Medicine

## 2010-05-08 ENCOUNTER — Telehealth: Payer: Self-pay | Admitting: Sports Medicine

## 2010-06-28 ENCOUNTER — Ambulatory Visit: Payer: Self-pay | Admitting: Cardiovascular Disease

## 2010-07-11 ENCOUNTER — Encounter: Payer: Self-pay | Admitting: Sports Medicine

## 2010-07-11 ENCOUNTER — Ambulatory Visit: Payer: Self-pay | Admitting: Family Medicine

## 2010-09-28 ENCOUNTER — Emergency Department (HOSPITAL_COMMUNITY)
Admission: EM | Admit: 2010-09-28 | Discharge: 2010-09-28 | Payer: Self-pay | Source: Home / Self Care | Admitting: Internal Medicine

## 2010-10-15 ENCOUNTER — Encounter: Admission: RE | Admit: 2010-10-15 | Discharge: 2010-10-15 | Payer: Self-pay | Admitting: Sports Medicine

## 2010-10-15 LAB — HM MAMMOGRAPHY

## 2010-10-22 ENCOUNTER — Ambulatory Visit: Payer: Self-pay | Admitting: Family Medicine

## 2010-10-23 ENCOUNTER — Telehealth: Payer: Self-pay | Admitting: *Deleted

## 2010-10-29 ENCOUNTER — Encounter: Payer: Self-pay | Admitting: Sports Medicine

## 2010-12-24 ENCOUNTER — Telehealth: Payer: Self-pay | Admitting: Family Medicine

## 2010-12-24 ENCOUNTER — Emergency Department (HOSPITAL_COMMUNITY)
Admission: EM | Admit: 2010-12-24 | Discharge: 2010-12-24 | Payer: Self-pay | Source: Home / Self Care | Admitting: Family Medicine

## 2010-12-31 ENCOUNTER — Encounter: Payer: Self-pay | Admitting: *Deleted

## 2011-01-03 ENCOUNTER — Telehealth: Payer: Self-pay | Admitting: *Deleted

## 2011-01-22 NOTE — Progress Notes (Signed)
Summary: Triage   Phone Note Call from Patient Call back at [phone]   Reason for Call: Talk to Nurse Summary of Call: having problems with allergies Initial call taken by: Knox Royalty,  March 08, 2010 11:07 AM  Follow-up for Phone Call        has head congestion. started 3-4 days ago. not taking any meds. has tried claritin before which worked. has appt monday & unable to come in. told her to buy OTC claritin since it has worked for her before. she agreed with plan Follow-up by: Golden Circle RN,  March 08, 2010 11:33 AM  Additional Follow-up for Phone Call Additional follow up Details #1::        Noted, thanks. Additional Follow-up by: Rodney Langton MD,  March 08, 2010 12:34 PM

## 2011-01-22 NOTE — Assessment & Plan Note (Signed)
 Summary: F/U VISIT/[facility]   Vital Signs:  Patient Profile:   70 Years Old Female Height:     63 inches Weight:      171.7 pounds Temp:     99.0 degrees F oral Pulse rate:   75 / minute BP sitting:   139 / 67  (left arm)  Vitals Entered By: LETITIA REUSING (January 10, 2009 2:00 PM)             Is Patient Diabetic? Yes     PCP:  DEBBY PETTIES MD  Chief Complaint:  F/u DM.  History of Present Illness: 87F with HTN, HLD, DM2 here for fu of chronic medical issues.  No complaints, worried about her granddaughter Rosella Claude who is in the hospital at this time.  Otherwise ok, checking CBGs two times a day, they usually run 97-123.  No HA, dizziness, N/V/D/C, CP, SOB, fevers/chills.     Current Allergies: SEPTRA DS (SULFAMETHOXAZOLE-TRIMETHOPRIM) CODEINE  Past Medical History:    Reviewed history from 02/19/2007 and no changes required:       Cath 11/01 Angioplasty PDA 90-40%, Former chewing tobacco use - quit 9/95, Hx thyroid  CA, s/p thyroid  surg, with hypothyroidi, LFTs 18/20 04/2005, NQWMI 11/01, Postmenopausal bleeding 3/99 with neg bx, TSH wnl 04/2005  Past Surgical History:    Reviewed history from 02/19/2007 and no changes required:       Barium Enema -, bilateral tubal ligation -, breast biopsy -, Cardiac Cath, PTCA 11/01 50% LAD, PDA 90 - 30% - 10/23/2000, Cardiac Cath: EF 60%; RCA 40%,LAD30%,L main 20% - 03/15/2003, cardiolyte - neg for ischemia EF 83% - 05/23/2001, Cholecystectomy - 08/23/2002, Colonoscopy: WNL by Dyane - 08/11/2003, echo- ef 55% - 12/19/2006, Endometrial Biopsy 3/99 - neg -, Hgb 10.8 - 04/22/2004, I&D, bartholin`s cyst -, lesion removal: sebb. 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 Lungs are clear to auscultation, no crackles or wheezes. Heart:     Normal rate and regular rhythm. S1 and S2 normal without gallop, murmur, click, rub or other extra sounds. Abdomen:     Bowel sounds positive,abdomen soft and non-tender without masses, organomegaly or hernias noted. Skin:     Intact without suspicious lesions or rashes    Impression & Recommendations:  Problem # 1:  HYPERTENSION, BENIGN SYSTEMIC (ICD-401.1) Pressure slightly elevated today, only systolic, diastolic WNL, likely due to pt's concern for her hospitalized granddaughter.  Will check again at next visit, if still elevated, will change meds.  Otherwise no changes.  Emphasized low-sodium diet.  Her  updated medication list for this problem includes:    Norvasc  10 Mg Tabs (Amlodipine  besylate) ..... One tab by mouth daily    Lisinopril -hydrochlorothiazide  20-12.5 Mg Tabs (Lisinopril -hydrochlorothiazide ) .SABRA..SABRA Two tabs once daily    Coreg  25 Mg Tabs (Carvedilol ) ..... One tab by mouth bid    Cardura 1 Mg Tabs (Doxazosin mesylate) ..... One tab by mouth daily  Orders: [facility]- Est Level  3 (00786)   Problem # 2:  DIABETES MELLITUS II, UNCOMPLICATED (ICD-250.00) Well controlled, pt very compliant with meds, HBA1c 7.8 today.  No changes needed.  Recheck HbA1c in 3 months.  Her updated medication list for this  problem includes:    Bayer Childrens Aspirin  81 Mg Chew (Aspirin ) .SABRA... Take 1 tablet by mouth once a day    Lantus  100 Unit/ml Soln (Insulin  glargine) ..... Inject 8 unit subcutaneously each morning. disp: 500units    Metformin  Hcl 1000 Mg Tabs (Metformin  hcl) .SABRA... Take 1 tablet by mouth two times a day    Lisinopril -hydrochlorothiazide  20-12.5 Mg Tabs (Lisinopril -hydrochlorothiazide ) .SABRA..SABRA Two tabs once daily    Glipizide Xl 10 Mg Tb24 (Glipizide) .SABRA... 1 tablet by mouth daily  Orders: [facility]- Est Level  3 (99213)   Problem # 3:  HYPERCHOLESTEROLEMIA (ICD-272.0) Last lipid panel extremely well controlled.  No changes at this time.  Her updated medication list for this problem includes:    Zocor  80 Mg Tabs (Simvastatin ) ..... One tab by mouth daily  Orders: [facility]- Est Level  3 (00786)   Complete Medication List: 1)  Bayer Childrens Aspirin  81 Mg Chew (Aspirin ) .... Take 1 tablet by mouth once a day 2)  Imdur  60 Mg Tb24 (Isosorbide  mononitrate) .... One tab by mouth daily 3)  Lantus  100 Unit/ml Soln (Insulin  glargine) .... Inject 8 unit subcutaneously each morning. disp: 500units 4)  Metformin  Hcl 1000 Mg Tabs (Metformin  hcl) .... Take 1 tablet by mouth two times a day 5)  Norvasc  10 Mg Tabs (Amlodipine  besylate) .... One tab by mouth daily 6)  Lisinopril -hydrochlorothiazide  20-12.5 Mg Tabs (Lisinopril -hydrochlorothiazide ) .... Two tabs once daily 7)  Synthroid  50 Mcg Tabs (Levothyroxine  sodium) .... One tab by mouth daily 8)  Zocor  80 Mg Tabs (Simvastatin ) .... One tab by mouth daily 9)  Coreg  25 Mg Tabs (Carvedilol ) .... One tab by mouth bid 10)  Aimsco Insulin  Syringe 31g X 5/16 0.3 Ml Misc (Insulin  syringe-needle u-100) .SABRA.. 100 use as directed with insulin  11)  Cardura 1 Mg Tabs (Doxazosin mesylate) .... One tab by mouth daily 12)  Glipizide Xl 10 Mg Tb24 (Glipizide) .SABRA.. 1 tablet by mouth daily 13)  Hydromet 5-1.5 Mg/5ml Syrp (Hydrocodone -homatropine) .... One teaspoonful qid as  needed for cough, 120 cc 14)  Tessalon Perles 100 Mg Caps (Benzonatate) .... One to two by mouth three times a day as needed for cough 15)  Flonase  50 Mcg/act Susp (Fluticasone  propionate) .... 2 sprays each nostril daily 16)  Flexeril  5 Mg Tabs (Cyclobenzaprine  hcl) .SABRA.. 1 tab by mouth every 8 hours as needed for pain.  this will make you sleepy- do not drive.  Other Orders: A1C-[facility] ([id number])   Patient Instructions: 1)  Nice to see you today, 2)  Your blood pressure was a little high today, this is only one episode and I know you are stressed out, but if it is still high at the next visit I will have to change your meds. 3)  Your A1C was 7.8 today.  That is ok and I won't change your diabetes  meds today, good job.  Make sure to continue to eat healthy. 4)  You cholesterol was fantastic, good job! 5)  Come back to see me again in 3 months. 6)  -Dr. ONEIDA.    Laboratory Results   Blood Tests   Date/Time Received: January 10, 2009 2:08 PM  Date/Time Reported: January 10, 2009 2:44 PM   HGBA1C: 7.8%   (Normal Range: Non-Diabetic - 3-6%   Control Diabetic - 6-8%)  Comments: ..........test performed by.....SABRASABRADeseree Blount (SMA)                 confirmed by...SABRASABRASABRA Bonnie A. Jordan, [place] (ASCP)

## 2011-01-22 NOTE — Assessment & Plan Note (Signed)
Summary: B12 inj,tcb   Nurse Visit   Allergies: 1)  Septra Ds (Sulfamethoxazole-Trimethoprim) 2)  Codeine  Medication Administration  Injection # 1:    Medication: Vit B12 1000 mcg    Diagnosis: VITAMIN B12 DEFICIENCY (ICD-266.2)    Route: IM    Site: R deltoid    Exp Date: 10/2009    Lot #: [id number]    Mfr: American Regent    Patient tolerated injection without complications    Given by: Theresia Lo RN (March 07, 2010 11:57 AM)  Orders Added: 1)  Vit B12 1000 mcg [J3420] 2)  Admin of Injection (IM/SQ) [04540]   Medication Administration  Injection # 1:    Medication: Vit B12 1000 mcg    Diagnosis: VITAMIN B12 DEFICIENCY (ICD-266.2)    Route: IM    Site: R deltoid    Exp Date: 10/2009    Lot #: [id number]    Mfr: American Regent    Patient tolerated injection without complications    Given by: Theresia Lo RN (March 07, 2010 11:57 AM)  Orders Added: 1)  Vit B12 1000 mcg [J3420] 2)  Admin of Injection (IM/SQ) [[id number]]  This injection makes a total  of injection monthly for 6 months  as directed by MD. will ask MD for further instructions or orders. Theresia Lo RN  March 07, 2010 11:58 AM  No further B12 injections.  Have her come see me to reassess symptoms and hematology. Rodney Langton MD  March 07, 2010 12:09 PM    patient notified. Theresia Lo RN  March 07, 2010 12:35 PM

## 2011-01-22 NOTE — Assessment & Plan Note (Signed)
 Summary: f/up,tcb   Vital Signs:  Patient profile:   70 year old female Weight:      166.1 pounds Temp:     98.4 degrees F oral Pulse rate:   75 / minute BP sitting:   118 / 70  (left arm)  Vitals Entered By: Letitia Reusing (July 14, 2009 4:00 PM) CC: f/u Is Patient Diabetic? Yes   Primary Care Provider:  DEBBY PETTIES MD  CC:  f/u.  History of Present Illness: 57F with HTN, HLD, DM2, normocytic anemia presents for fu of these.  HTN:  Well controlled, no adverse effects from medications.  HLD: Last lipid panel well controlled.  Due for recheck.  DM2:  Last A1c 8.0.  Goal is <7.  Pt admits she has been very stressed with death of a family member and has been eating with indiscretions for 4 months now.  Using all meds as prescribed.  Anemia:  Hb Low, got B12 shot and hb did not improve.  Asymptomatic.  Not menstruating. Colonoscopy normal in 11-13-03.  No melena/hematochezia.  Hx gastritis, not on PPI.  Habits & Providers  Alcohol-Tobacco-Diet     Tobacco Status: never  Allergies: 1)  Septra Ds (Sulfamethoxazole-Trimethoprim) 2)  Codeine  Past History:  Past Medical History: Last updated: 03/29/2009 Cath 11/01 Angioplasty PDA 90-40%, Former chewing tobacco use - quit 9/95, Hx thyroid  CA, s/p thyroid  surg, with hypothyroidi, LFTs 18/20 04/2005, NQWMI 11/01, Postmenopausal bleeding 3/99 with neg bx, TSH wnl 04/2005 GASTRITIS (ICD-535.50) POSTMENOPAUSAL STATUS (ICD-V49.81) CHEST WALL PAIN, ANTERIOR (ICD-786.52) EUSTACHIAN TUBE DYSFUNCTION, LEFT (ICD-381.81) UPPER RESPIRATORY INFECTION, VIRAL (ICD-465.9) SCREENING FOR MALIGNANT NEOPLASM OF THE CERVIX (ICD-V76.2) DYSURIA (ICD-788.1) HYPOTHYROIDISM, UNSPECIFIED (ICD-244.9) HYPERTENSION, BENIGN SYSTEMIC (ICD-401.1) HYPERCHOLESTEROLEMIA (ICD-272.0) GASTROESOPHAGEAL REFLUX, NO ESOPHAGITIS (ICD-530.81) DIABETES MELLITUS II, UNCOMPLICATED (ICD-250.00) CORONARY, ARTERIOSCLEROSIS (ICD-414.00) 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 Keratosis, no CA - 09/13/2004, TSH 0.225, free T4 1.33 - 05/24/2004  03/15/09 Cath by Dr. Obie, 80-90% stenosis of diagonal branch of LAD.  PTCA performed.  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  Physical Exam  General:  Well-developed,well-nourished,in no acute distress; alert,appropriate and cooperative throughout examination Lungs:  Normal respiratory effort, chest expands symmetrically. Lungs are clear to auscultation, no crackles or wheezes. Heart:  Normal rate and regular rhythm. S1 and S2 normal without gallop, murmur, click, rub or other extra sounds. Abdomen:  Bowel sounds positive,abdomen soft and non-tender without masses, organomegaly or hernias noted.   Impression & Recommendations:  Problem # 1:  HYPERTENSION, BENIGN SYSTEMIC (ICD-401.1) Assessment Improved Well controlled, no changes needed.  Her updated medication list for this problem  includes:    Norvasc  10 Mg Tabs (Amlodipine  besylate) ..... One tab by mouth  daily    Coreg  25 Mg Tabs (Carvedilol ) ..... One tab by mouth bid    Lisinopril  40 Mg Tabs (Lisinopril ) .SABRA... 1 tab by mouth once daily    Hydrochlorothiazide  25 Mg Tabs (Hydrochlorothiazide ) ..... One tab by mouth daily  Orders: [facility]- Est  Level 4 (99214)  Problem # 2:  HYPERCHOLESTEROLEMIA (ICD-272.0) Assessment: Unchanged Will recheck FLP.  Her updated medication list for this problem includes:    Zocor  80 Mg Tabs (Simvastatin ) ..... One tab by mouth daily  Orders: [facility]- Est  Level 4 (99214)Future Orders: Lipid-[facility] ([id number]) ... 07/13/2010  Problem # 3:  DIABETES MELLITUS II, UNCOMPLICATED (ICD-250.00) Assessment: Deteriorated A1c worse.  Will let pt get back on track and recheck A1c Oct  No changes in meds at this time.  Her updated medication list for this problem includes:    Bayer Childrens Aspirin  81 Mg Chew (Aspirin ) .SABRA... Take 1 tablet by mouth once a day    Lantus  100 Unit/ml Soln (Insulin  glargine) .SABRA... 8 units in the morning    Glipizide Xl 10 Mg Tb24 (Glipizide) .SABRA... 2 tablet by mouth daily    Lisinopril  40 Mg Tabs (Lisinopril ) .SABRA... 1 tab by mouth once daily    Metformin  Hcl 1000 Mg Tabs (Metformin  hcl) .SABRA... 1 tab by mouth two times a day  Orders: [facility]- Est  Level 4 (00785)  Problem # 4:  ANEMIA, OTHER, UNSPECIFIED (ICD-285.9) Assessment: Deteriorated Will check anemia panel when pt returns for FLP.  Her updated medication list for this problem includes:    Ferrous Sulfate  325 (65 Fe) Mg Tabs (Ferrous sulfate ) ..... One tab by mouth bid  Orders: [facility]- Est  Level 4 (99214)Future Orders: CBC-[facility] ([id number]) ... 07/13/2010 B12-[facility] ([id number]) ... 07/13/2010 Ferritin-[facility] ([id number]) ... 07/13/2010 Folate-[facility] ([id number]) ... 07/13/2010 Iron -[facility] [id number]) ... 07/13/2010 Iron Binding Cap (TIBC)-[facility] ([id number]) ... 07/13/2010 Retic-[facility] ([id number]) ... 07/13/2010  Complete Medication List: 1)  Bayer Childrens Aspirin  81 Mg Chew (Aspirin ) ....  Take 1 tablet by mouth once a day 2)  Imdur  60 Mg Tb24 (Isosorbide  mononitrate) .... One tab by mouth daily 3)  Lantus  100 Unit/ml Soln (Insulin  glargine) .... 8 units in the morning 4)  Norvasc  10 Mg Tabs (Amlodipine  besylate) .... One tab by mouth daily 5)  Synthroid  50 Mcg Tabs (Levothyroxine  sodium) .... One tab by mouth daily 6)  Zocor  80 Mg Tabs (Simvastatin ) .... One tab by mouth daily 7)  Coreg  25 Mg Tabs (Carvedilol ) .... One tab by mouth bid 8)  Aimsco Insulin  Syringe 31g X 5/16 0.3 Ml Misc (Insulin  syringe-needle u-100) .SABRA.. 100 use as directed with insulin  9)  Glipizide Xl 10 Mg Tb24 (Glipizide) .... 2 tablet by mouth daily 10)  Flonase  50 Mcg/act Susp (Fluticasone  propionate) .... 2 sprays each nostril daily 11)  Lisinopril  40 Mg Tabs (Lisinopril ) .SABRA.. 1 tab by mouth once daily 12)  Metformin  Hcl 1000 Mg Tabs (Metformin  hcl) .SABRA.. 1 tab by mouth two times a day 13)  Plavix 75 Mg Tabs (Clopidogrel bisulfate) .SABRA.. 1 tab by mouth once daily 14)  Hydrochlorothiazide  25 Mg Tabs (Hydrochlorothiazide ) .... One tab by mouth daily 15)  Ferrous Sulfate  325 (65 Fe) Mg Tabs (Ferrous sulfate ) .... One tab by mouth bid  Patient Instructions: 1)  Great to see you today, 2)  I know you had a lot of stress recently.  Try to get your diet back on schedule so  that we can get your A1c back down to where it should be. 3)  I want you to go to the front desk and make a lab appointment to have your cholesterol and anemia panel checked.  You should be fasting so the blood should be drawn in the morning. 4)  Come back to see me in 3 months.  We will see each other every 3 months until your A1c is back where it is supposed to be. 5)  -Dr. ONEIDA.   Prevention & Chronic Care Immunizations   Influenza vaccine: Fluvax MCR  (10/11/2008)   Influenza vaccine due: 10/11/2009    Tetanus booster: 06/27/2005: Done.   Tetanus booster due: 06/28/2015    Pneumococcal vaccine: Done.  (06/22/1994)   Pneumococcal  vaccine due: None    H. zoster vaccine: Not documented  Colorectal Screening   Hemoccult: Done.  (02/21/2003)   Hemoccult due: Not Indicated    Colonoscopy: Done.  (07/24/2003)   Colonoscopy due: 07/23/2013  Other Screening   Pap smear: normal  (01/18/2008)   Pap smear due: Not Indicated    Mammogram: normal  (10/11/2008)   Mammogram due: 10/11/2009    DXA bone density scan: normal  (10/11/2008)   DXA scan due: None    Smoking status: never  (07/14/2009)  Diabetes Mellitus   HgbA1C: 8.0  (07/05/2009)   Hemoglobin A1C due: 10/05/2009    Eye exam: normal  (10/07/2008)   Eye exam due: 10/07/2009    Foot exam: yes  (10/11/2008)   High risk foot: Not documented   Foot care education: completed  (10/11/2008)   Foot exam due: 10/11/2009    Urine microalbumin/creatinine ratio: Not documented   Urine microalbumin/cr due: 10/11/2009  Lipids   Total Cholesterol: 132  (12/19/2008)   LDL: 65  (12/19/2008)   LDL Direct: Not documented   HDL: 50  (12/19/2008)   Triglycerides: 83  (12/19/2008)    SGOT (AST): 17  (02/17/2007)   SGPT (ALT): 22  (02/17/2007)   Alkaline phosphatase: 64  (02/17/2007)   Total bilirubin: 0.5  (02/17/2007)  Hypertension   Last Blood Pressure: 118 / 70  (07/14/2009)   Serum creatinine: 0.68  (10/11/2008)   Serum potassium 3.6  (10/11/2008)  Self-Management Support :    Diabetes self-management support: Not documented   Last diabetes self-management training by diabetes educator: completed    Hypertension self-management support: Not documented    Lipid self-management support: Not documented    Herpes Zoster Next Due:  Not Indicated Last Hemoccult Result: Done. (02/21/2003 12:00:00 AM) Hemoccult Next Due:  Not Indicated Last HGBA1C:  8.0 (07/05/2009 2:40:19 PM) HGBA1C Next Due:  3 mo

## 2011-01-22 NOTE — Assessment & Plan Note (Signed)
 Summary: Clinical List Update, not an office visit.   Last HDL:  41 (02/17/2007 8:25:00 PM) HDL Next Due:  1 yr Last LDL:  55 (02/17/2007 8:25:00 PM) LDL Next Due:  1 yr Last Flex Sig:  Done. (07/24/2003 12:00:00 AM) Flex Sig Next Due:  Not Indicated Last Creatinine:  0.68 (10/11/2008 8:40:00 PM) Creatinine Next Due: 1 yr Last Potassium:  3.6 (10/11/2008 8:40:00 PM) Potassium Next Due:  1 yr     Current Allergies: SEPTRA DS (SULFAMETHOXAZOLE-TRIMETHOPRIM) CODEINE        Complete Medication List: 1)  Bayer Childrens Aspirin  81 Mg Chew (Aspirin ) .... Take 1 tablet by mouth once a day 2)  Imdur  60 Mg Tb24 (Isosorbide  mononitrate) .... One tab by mouth daily 3)  Lantus  100 Unit/ml Soln (Insulin  glargine) .... Inject 8 unit subcutaneously each morning. disp: 500units 4)  Metformin  Hcl 1000 Mg Tabs (Metformin  hcl) .... Take 1 tablet by mouth two times a day 5)  Norvasc  10 Mg Tabs (Amlodipine  besylate) .... One tab by mouth daily 6)  Lisinopril -hydrochlorothiazide  20-12.5 Mg Tabs (Lisinopril -hydrochlorothiazide ) .... Two tabs once daily 7)  Synthroid  50 Mcg Tabs (Levothyroxine  sodium) .... One tab by mouth daily 8)  Zocor  80 Mg Tabs (Simvastatin ) .... One tab by mouth daily 9)  Coreg  25 Mg Tabs (Carvedilol ) .... One tab by mouth bid 10)  Aimsco Insulin  Syringe 31g X 5/16 0.3 Ml Misc (Insulin  syringe-needle u-100) .SABRA.. 100 use as directed with insulin  11)  Cardura 1 Mg Tabs (Doxazosin mesylate) .... One tab by mouth daily 12)  Glipizide Xl 10 Mg Tb24 (Glipizide) .SABRA.. 1 tablet by mouth daily 13)  Hydromet 5-1.5 Mg/5ml Syrp (Hydrocodone -homatropine) .... One teaspoonful qid as needed for cough, 120 cc 14)  Tessalon Perles 100 Mg Caps (Benzonatate) .... One to two by mouth three times a day as needed for cough 15)  Flonase  50 Mcg/act Susp (Fluticasone  propionate) .... 2 sprays each nostril daily 16)  Flexeril  5 Mg Tabs (Cyclobenzaprine  hcl) .SABRA.. 1 tab by mouth every 8 hours as needed  for pain.  this will make you sleepy- do not drive.    ]

## 2011-01-22 NOTE — Assessment & Plan Note (Signed)
 Summary: wp   Vital Signs:  Patient Profile:   70 Years Old Female Height:     63 inches Weight:      166 pounds Temp:     98.3 degrees F Pulse rate:   69 / minute BP sitting:   119 / 63  Pt. in pain?   no  Vitals Entered By: HARLENE CARTE CMA (October 11, 2008 1:43 PM)                 Last Flex Sig:  Done. (07/24/2003 12:00:00 AM) Flex Sig Next Due:  Not Indicated Last Hemoccult Result: Done. (02/21/2003 12:00:00 AM) Hemoccult Next Due:  Not Indicated Last PAP:  Done. (06/27/2005 12:00:00 AM) PAP Result Date:  01/18/2008 PAP Result:  normal Diabetic Foot Exam Date:  10/11/2008 Diabetes Foot Check Result:  normal Diabetes Foot Check Next Due:  1 yr Diabetic Eye Exam Date:  10/07/2008 Diabetes Eye Exam Result:  normal Diabetes Eye Exam Due:  1 yr Foot Care Education Date:  10/11/2008 Foot Care Education:  completed Foot Care Education Due:  1 yr Self-Mgt EDU Date:  10/11/2008 Self-Mgt EDU:  completed Self-Mgt EDU Next Due:  1 yr    PCP:  DEBBY PETTIES MD  Chief Complaint:  Routine visit.  History of Present Illness: 70 y/o female with DM2, HTN, HLD, presents for routine office visit.  Doing well, no complaints, checking CBGs two times a day, usually 70s-120's.  Taking meds regularly.  No HA, dizziness, weakness, numbness, cough, SOB, CP, N/V/D/C, abd pain, joint pain, rectal or vaginal bleeding.    Current Allergies: SEPTRA DS (SULFAMETHOXAZOLE-TRIMETHOPRIM) CODEINE  Past Medical History:    Reviewed history from 02/19/2007 and no changes required:       Cath 11/01 Angioplasty PDA 90-40%, Former chewing tobacco use - quit 9/95, Hx thyroid  CA, s/p thyroid  surg, with hypothyroidi, LFTs 18/20 04/2005, NQWMI 11/01, Postmenopausal bleeding 3/99 with neg bx, TSH wnl 04/2005  Past Surgical History:    Reviewed history from 02/19/2007 and no changes required:       Barium Enema -, bilateral tubal ligation -, breast biopsy -, Cardiac Cath, PTCA 11/01 50%  LAD, PDA 90 - 30% - 10/23/2000, Cardiac Cath: EF 60%; RCA 40%,LAD30%,L main 20% - 03/15/2003, cardiolyte - neg for ischemia EF 83% - 05/23/2001, Cholecystectomy - 08/23/2002, Colonoscopy: WNL by Dyane - 08/11/2003, echo- ef 55% - 12/19/2006, Endometrial Biopsy 3/99 - neg -, Hgb 10.8 - 04/22/2004, I&D, bartholin`s cyst -, lesion removal: sebb. 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   Physical Exam  General:     Well-developed,well-nourished,in no acute distress; alert,appropriate and cooperative throughout examination Head:     Normocephalic and atraumatic without obvious abnormalities. No apparent alopecia or balding. Eyes:     No corneal or conjunctival inflammation noted. EOMI. Perrla. Ears:     External ear exam shows no significant lesions or deformities. Nose:     External nasal examination shows no deformity or inflammation. Nasal mucosa are pink and moist without lesions or exudates. Mouth:     Oral mucosa and oropharynx without lesions or exudates.  Neck:     No  deformities, masses, or tenderness noted. Chest Wall:     No deformities, masses, or tenderness noted. Lungs:     Normal respiratory effort, chest expands symmetrically. Lungs are clear to auscultation, no crackles or wheezes. Heart:     Normal rate and regular rhythm. S1 and S2 normal without gallop, murmur, click, rub or other extra sounds. Abdomen:     Bowel sounds positive,abdomen soft and non-tender without masses, organomegaly or hernias noted. Extremities:     No clubbing, cyanosis, edema, or deformity noted with normal full range of motion of all joints.   Neurologic:     Grossly non-focal.  Sensation preserved distally. Skin:     Intact without suspicious lesions or rashes Psych:     AAOx3  Diabetes Management Exam:    Foot Exam (with socks and/or shoes not present):       Sensory-Pinprick/Light touch:          Left medial foot (L-4): normal          Left dorsal foot (L-5): normal          Left lateral foot (S-1): normal          Right medial foot (L-4): normal          Right dorsal foot (L-5): normal          Right lateral foot (S-1): normal       Sensory-Monofilament:          Left foot: normal          Right foot: normal       Inspection:          Left foot: normal          Right foot: normal       Nails:          Left foot: normal          Right foot: normal    Eye Exam:       Eye Exam done elsewhere          Date: 10/07/2008          Results: normal    Impression & Recommendations:  Problem # 1:  DIABETES MELLITUS II, UNCOMPLICATED (ICD-250.00) Pt doing well, taking meds as prescribed.  Will check A1c and other preventive labs (Bmet, U/A).  Her updated medication list for this problem includes:    Bayer Childrens Aspirin  81 Mg Chew (Aspirin ) .SABRA... Take 1 tablet by mouth once a day    Lantus  100 Unit/ml Soln (Insulin  glargine) ..... Inject 8 unit subcutaneously each morning. disp: 500units    Metformin  Hcl 1000 Mg Tabs (Metformin  hcl) .SABRA... Take 1  tablet by mouth two times a day    Lisinopril -hydrochlorothiazide  20-12.5 Mg Tabs (Lisinopril -hydrochlorothiazide ) .SABRA..SABRA Two tabs once daily    Glipizide Xl 10 Mg Tb24 (Glipizide) .SABRA... 1 tablet by mouth daily  Orders: A1C-[facility] (16963) Ophthalmology Referral (Ophthalmology) UA Microalbumin-[facility] [phone]) Basic Met-[facility] [phone]) Flu Vaccine 23yrs + (09341) [facility]- Est  Level 4 (00785)   Problem # 2:  HYPERTENSION, BENIGN SYSTEMIC (ICD-401.1) Controlled, no changes required.  Her updated medication list for this problem includes:    Norvasc  10 Mg Tabs (Amlodipine  besylate) ..... One tab by mouth daily    Lisinopril -hydrochlorothiazide  20-12.5 Mg Tabs (Lisinopril -hydrochlorothiazide ) .SABRA..SABRA Two tabs once daily    Coreg  25 Mg Tabs (Carvedilol ) ..... One tab by mouth bid    Cardura 1 Mg Tabs (Doxazosin mesylate) ..... One tab by mouth daily   Problem # 3:  HYPERCHOLESTEROLEMIA (ICD-272.0) Last lipid check in 2008, normal.  Her updated medication list for this problem includes:    Zocor  80 Mg Tabs (Simvastatin ) ..... One tab by mouth daily   Complete Medication List: 1)  Bayer Childrens Aspirin  81 Mg Chew (Aspirin ) .... Take 1 tablet by mouth once a day 2)  Imdur  60 Mg Tb24 (Isosorbide  mononitrate) .... One tab by mouth daily 3)  Lantus  100 Unit/ml Soln (Insulin  glargine) .... Inject 8 unit subcutaneously each morning. disp: 500units 4)  Metformin  Hcl 1000 Mg Tabs (Metformin  hcl) .... Take 1 tablet by mouth two times a day 5)  Norvasc  10 Mg Tabs (Amlodipine  besylate) .... One tab by mouth daily 6)  Lisinopril -hydrochlorothiazide  20-12.5 Mg Tabs (Lisinopril -hydrochlorothiazide ) .... Two tabs once daily 7)  Synthroid  50 Mcg Tabs (Levothyroxine  sodium) .... One tab by mouth daily 8)  Zocor  80 Mg Tabs (Simvastatin ) .... One tab by mouth daily 9)  Coreg  25 Mg Tabs (Carvedilol ) .... One tab by mouth bid 10)  Aimsco Insulin  Syringe 31g X 5/16 0.3 Ml Misc (Insulin  syringe-needle u-100) .SABRA.. 100  use as directed with insulin  11)  Cardura 1 Mg Tabs (Doxazosin mesylate) .... One tab by mouth daily 12)  Glipizide Xl 10 Mg Tb24 (Glipizide) .SABRA.. 1 tablet by mouth daily 13)  Hydromet 5-1.5 Mg/5ml Syrp (Hydrocodone -homatropine) .... One teaspoonful qid as needed for cough, 120 cc 14)  Tessalon Perles 100 Mg Caps (Benzonatate) .... One to two by mouth three times a day as needed for cough 15)  Flonase  50 Mcg/act Susp (Fluticasone  propionate) .... 2 sprays each nostril daily 16)  Flexeril  5 Mg Tabs (Cyclobenzaprine  hcl) .SABRA.. 1 tab by mouth every 8 hours as needed for pain.  this will make you sleepy- do not drive.  Other Orders: Radiology Referral (Radiology)   Patient Instructions: 1)  It was great to meet you today.  Your blood pressure is great!  I will be doing a few tests to get your caught up with your preventive medicine: 2)  Flu vaccine 3)  BMET 4)  A1c 5)  Urinalysis 6)  Also be sure to get to your Mammogram appointment and your bone density test. 7)  You should come back in 3 months to have your A1c checked again. 8)  It is important that you exercise regularly at least 20 minutes 5 times a week. If you develop chest pain, have severe difficulty breathing, or feel very tired , stop exercising immediately and seek medical attention. 9)  Check your blood sugars regularly. If your readings are usually above 400 : or below 70 you should contact our office. 10)  It is important that  your Diabetic A1c level is checked every 3 months. 11)  Check your feet each night for sore areas, calluses or signs of infection. 12)  -Dr. ONEIDA.   ] Laboratory Results   Blood Tests   Date/Time Received: October 11, 2008 1:40 PM  Date/Time Reported: October 11, 2008 1:53 PM   HGBA1C: 7.8%   (Normal Range: Non-Diabetic - 3-6%   Control Diabetic - 6-8%)  Comments: ...............test performed by......SABRABonnie A. Jordan, [place] (ASCP)     Appended Document: flu shot   Influenza Vaccine     Vaccine Type: Fluvax MCR    Site: left deltoid    Mfr: GlaxoSmithKline    Dose: 0.5 ml    Route: IM    Given by: JESSICA FLEEGER CMA    Exp. Date: 06/21/2009    Lot #: [id number]    VIS given: 07/16/07 version given October 11, 2008.  Flu Vaccine Consent Questions    Do you have a history of severe allergic reactions to this vaccine? no    Any prior history of allergic reactions to egg and/or gelatin? no    Do you have a sensitivity to the preservative Thimersol? no    Do you have a past history of Guillan-Barre Syndrome? no    Do you currently have an acute febrile illness? no    Have you ever had a severe reaction to latex? no    Vaccine information given and explained to patient? yes    Are you currently pregnant? no   Appended Document: Imaging Bone Densiometry done, normal.    Bone Density Result Date:  10/11/2008 Bone Density Result:  normal     Current Allergies: SEPTRA DS (SULFAMETHOXAZOLE-TRIMETHOPRIM) CODEINE        Complete Medication List: 1)  Bayer Childrens Aspirin  81 Mg Chew (Aspirin ) .... Take 1 tablet by mouth once a day 2)  Imdur  60 Mg Tb24 (Isosorbide  mononitrate) .... One tab by mouth daily 3)  Lantus  100 Unit/ml Soln (Insulin  glargine) .... Inject 8 unit subcutaneously each morning. disp: 500units 4)  Metformin  Hcl 1000 Mg Tabs (Metformin  hcl) .... Take 1 tablet by mouth two times a day 5)  Norvasc  10 Mg Tabs (Amlodipine  besylate) .... One tab by mouth daily 6)  Lisinopril -hydrochlorothiazide  20-12.5 Mg Tabs (Lisinopril -hydrochlorothiazide ) .... Two tabs once daily 7)  Synthroid  50 Mcg Tabs (Levothyroxine  sodium) .... One tab by mouth daily 8)  Zocor  80 Mg Tabs (Simvastatin ) .... One tab by mouth daily 9)  Coreg  25 Mg Tabs (Carvedilol ) .... One tab by mouth bid 10)  Aimsco Insulin  Syringe 31g X 5/16 0.3 Ml Misc (Insulin  syringe-needle u-100) .SABRA.. 100 use as directed with insulin  11)  Cardura 1 Mg Tabs (Doxazosin mesylate) .... One tab by mouth  daily 12)  Glipizide Xl 10 Mg Tb24 (Glipizide) .SABRA.. 1 tablet by mouth daily 13)  Hydromet 5-1.5 Mg/5ml Syrp (Hydrocodone -homatropine) .... One teaspoonful qid as needed for cough, 120 cc 14)  Tessalon Perles 100 Mg Caps (Benzonatate) .... One to two by mouth three times a day as needed for cough 15)  Flonase  50 Mcg/act Susp (Fluticasone  propionate) .... 2 sprays each nostril daily 16)  Flexeril  5 Mg Tabs (Cyclobenzaprine  hcl) .SABRA.. 1 tab by mouth every 8 hours as needed for pain.  this will make you sleepy- do not drive.    ]

## 2011-01-22 NOTE — Consult Note (Signed)
Summary: Geralynn Rile  [facility]   Imported By: Clydell Hakim 04/12/2010 11:27:12  _____________________________________________________________________  External Attachment:    Type:   Image     Comment:   External Document  Appended Document: [facility]     Clinical Lists Changes  Observations: Added new observation of MICRALB DUE: 03/29/2011 (04/12/2010 22:49) Added new observation of DMEYEEXAMNXT: 04/10/2011 (04/12/2010 22:49) Added new observation of DBT EY CK DT: 04/09/2010 (04/09/2010 22:49) Added new observation of DIAB EYE EX: normal (04/09/2010 22:49)      Last Diabetes Eye Exam:  normal (10/07/2008 2:15:02 PM) Diabetic Eye Exam Date:  04/09/2010 Diabetes Eye Exam Result:  normal Diabetes Eye Exam Due:  1 yr Microalbumin Next Due:  1 yr

## 2011-01-22 NOTE — Letter (Signed)
Summary: Handout Printed  Printed Handout:  - Diabetes, Taking Care of Yourself 

## 2011-01-22 NOTE — Assessment & Plan Note (Signed)
Summary: B12 inj,tcb   Nurse Visit   Allergies: 1)  Septra Ds (Sulfamethoxazole-Trimethoprim) 2)  Codeine  Medication Administration  Injection # 1:    Medication: Vit B12 1000 mcg    Diagnosis: VITAMIN B12 DEFICIENCY (ICD-266.2)    Route: IM    Site: L deltoid    Exp Date: 10/2011    Lot #: [id number]    Mfr: American Regent    Patient tolerated injection without complications    Given by: Theresia Lo RN (February 08, 2010 11:21 AM)  Orders Added: 1)  Vit B12 1000 mcg [J3420] 2)  Admin of Injection (IM/SQ) [16109]   Medication Administration  Injection # 1:    Medication: Vit B12 1000 mcg    Diagnosis: VITAMIN B12 DEFICIENCY (ICD-266.2)    Route: IM    Site: L deltoid    Exp Date: 10/2011    Lot #: [id number]    Mfr: [employer] Regent    Patient tolerated injection without complications    Given by: Theresia Lo RN (February 08, 2010 11:21 AM)  Orders Added: 1)  Vit B12 1000 mcg [J3420] 2)  Admin of Injection (IM/SQ) [60454]    patient is due for one more monthly B12 in  March to make a total of 6 injections of B12 monthly. will ask  for further orders.  Theresia Lo RN  February 08, 2010 11:23 AM  Thanks, no further injections needed after that Rodney Langton MD  February 08, 2010 1:56 PM

## 2011-01-22 NOTE — Progress Notes (Signed)
Summary: phn msg   Phone Note Call from Patient Call back at Home Phone [phone]   Caller: Patient Summary of Call: wants to know if she can get a handicap sticker Initial call taken by: De Nurse,  October 23, 2010 10:52 AM  Follow-up for Phone Call        No, these promote a more sedentary lifestyle particulary when used in those who are ambulatory, I have observed Ms. Melka walk and she should park as far from wherever she is going to increase the amt of walking she does. Follow-up by: Rodney Langton MD,  October 23, 2010 2:21 PM  Additional Follow-up for Phone Call Additional follow up Details #1::        Spoke with pt and informed her that it is not medically necessary for her to have a handicap sticker. She said to me that she only needs it for going to the mall because her chest hurts when she gets to entrance. I told her that being it was not necessary for everywhere that she would prbably not be able to get one Additional Follow-up by: Jimmy Footman, CMA,  October 23, 2010 4:33 PM

## 2011-01-22 NOTE — Assessment & Plan Note (Signed)
 Summary: f/up,tcb   Vital Signs:  Patient profile:   70 year old female Weight:      165 pounds Temp:     98.8 degrees F BP sitting:   126 / 65  Vitals Entered By: Giovanna Ada CMA (September 28, 2009 4:19 PM)  Primary Care Provider:  DEBBY PETTIES MD   History of Present Illness: 61F  roaring sensation resolved, here for fu of anemia, HTN, DM2.  Anemia:  was getting b12 shots but stopped, symptoms slightly better.  Still taking iron as well.  Folate levels normal on anemia panel.  HTN:  Controlled.  DM2:  A1c 8 at last visit, 8.2 today.  Allergies: 1)  Septra Ds (Sulfamethoxazole-Trimethoprim) 2)  Codeine  Review of Systems       Se HPI  Physical Exam  General:  Well-developed,well-nourished,in no acute distress; alert,appropriate and cooperative throughout examination Lungs:  Normal respiratory effort, chest expands symmetrically. Lungs are clear to auscultation, no crackles or wheezes. Heart:  Normal rate and regular rhythm. S1 and S2 normal without gallop, murmur, click, rub or other extra sounds.   Impression & Recommendations:  Problem # 1:  EUSTACHIAN TUBE DYSFUNCTION, LEFT (ICD-381.81) Assessment Improved Resolved.  Orders: [facility]- Est  Level 4 (00785)  Problem # 2:  ANEMIA, OTHER, UNSPECIFIED (ICD-285.9) Assessment: Unchanged Cont Iron, B12, recheck CBC today.  Her updated medication list for this problem includes:    Ferrous Sulfate  325 (65 Fe) Mg Tabs (Ferrous sulfate ) ..... One tab by mouth bid  Orders: CBC-[facility] (14972) [facility]- Est  Level 4 (00785)  Problem # 3:  HYPERTENSION, BENIGN SYSTEMIC (ICD-401.1) Assessment: Unchanged Controlled, no changes.  Her updated medication list for this problem includes:    Norvasc  10 Mg Tabs (Amlodipine  besylate) ..... One tab by mouth daily    Coreg  25 Mg Tabs (Carvedilol ) ..... One tab by mouth bid    Lisinopril  40 Mg Tabs (Lisinopril ) .SABRA... 1 tab by mouth once daily    Hydrochlorothiazide  25 Mg Tabs  (Hydrochlorothiazide ) ..... One tab by mouth daily  Problem # 4:  DIABETES MELLITUS II, UNCOMPLICATED (ICD-250.00) Assessment: Deteriorated A1c elevated today.  Increase Lantus  dose to 10 units qAM.  Her updated medication list for this problem includes:    Bayer Childrens Aspirin  81 Mg Chew (Aspirin ) .SABRA... Take 1 tablet by mouth once a day    Lantus  100 Unit/ml Soln (Insulin  glargine) .SABRASABRASABRASABRA 10 units in the morning    Glipizide Xl 10 Mg Tb24 (Glipizide) .SABRA... 2 tablet by mouth daily    Lisinopril  40 Mg Tabs (Lisinopril ) .SABRA... 1 tab by mouth once daily    Metformin  Hcl 1000 Mg Tabs (Metformin  hcl) .SABRA... 1 tab by mouth two times a day  Orders: A1C-[facility] (16963) [facility]- Est  Level 4 (00785)  Complete Medication List: 1)  Bayer Childrens Aspirin  81 Mg Chew (Aspirin ) .... Take 1 tablet by mouth once a day 2)  Imdur  60 Mg Tb24 (Isosorbide  mononitrate) .... One tab by mouth daily 3)  Lantus  100 Unit/ml Soln (Insulin  glargine) .SABRA.. 10 units in the morning 4)  Norvasc  10 Mg Tabs (Amlodipine  besylate) .... One tab by mouth daily 5)  Synthroid  50 Mcg Tabs (Levothyroxine  sodium) .... One tab by mouth daily 6)  Zocor  80 Mg Tabs (Simvastatin ) .... One tab by mouth daily 7)  Coreg  25 Mg Tabs (Carvedilol ) .... One tab by mouth bid 8)  Aimsco Insulin  Syringe 31g X 5/16 0.3 Ml Misc (Insulin  syringe-needle u-100) .SABRA.. 100 use as directed  with insulin  9)  Glipizide Xl 10 Mg Tb24 (Glipizide) .... 2 tablet by mouth daily 10)  Flonase  50 Mcg/act Susp (Fluticasone  propionate) .... 2 sprays each nostril daily 11)  Lisinopril  40 Mg Tabs (Lisinopril ) .SABRA.. 1 tab by mouth once daily 12)  Metformin  Hcl 1000 Mg Tabs (Metformin  hcl) .SABRA.. 1 tab by mouth two times a day 13)  Plavix 75 Mg Tabs (Clopidogrel bisulfate) .SABRA.. 1 tab by mouth once daily 14)  Hydrochlorothiazide  25 Mg Tabs (Hydrochlorothiazide ) .... One tab by mouth daily 15)  Ferrous Sulfate  325 (65 Fe) Mg Tabs (Ferrous sulfate ) .... One tab by mouth bid 16)   Vitamin B12 (cyanocobalamin ) 1000mcg  .... Intramuscular injection qdaily x 1week, then qweekly x1 month, then qmonthly x 6 months. 17)  Claritin -d 24 Hour 10-240 Mg Xr24h-tab (Loratadine -pseudoephedrine ) .... One tab by mouth daily. use 5 days then stop. 18)  Simply Saline 0.9 % Aers (Saline) .... Spray 2 sprays in each nostril 4 times a day as needed.  Other Orders: Influenza Vaccine MCR [phone])  Last HGBA1C:  8.0 (07/05/2009 2:40:19 PM) HGBA1C Next Due:  3 mo   Influenza Vaccine    Vaccine Type: Fluvax MCR    Site: left deltoid    Mfr: GlaxoSmithKline    Dose: 0.25 ml    Route: IM    Given by: Giovanna Ada CMA    Exp. Date: 06/21/2010    Lot #: [id number]    VIS given: 07/16/07 version given September 28, 2009.  Flu Vaccine Consent Questions    Do you have a history of severe allergic reactions to this vaccine? no    Any prior history of allergic reactions to egg and/or gelatin? no    Do you have a sensitivity to the preservative Thimersol? no    Do you have a past history of Guillan-Barre Syndrome? no    Do you currently have an acute febrile illness? no    Have you ever had a severe reaction to latex? no    Vaccine information given and explained to patient? yes    Are you currently pregnant? no  Laboratory Results   Blood Tests   Date/Time Received: September 28, 2009 5:01 PM  Date/Time Reported: September 28, 2009 5:17 PM   HGBA1C: 8.2 %   (Normal Range: Non-Diabetic - 3-6%   Control Diabetic - 6-8%)  Comments: ...............test performed by......SABRABonnie A. Jordan, [place] (ASCP)      Appended Document: f/up,tcb Called Pt, family member to give message to increase lantus  slightly to 10 units from 8.

## 2011-01-22 NOTE — Assessment & Plan Note (Signed)
Summary: f/up/flu shot,tcb   Vital Signs:  Patient profile:   70 year old female Height:      68 inches Weight:      167 pounds BMI:     25.48 Temp:     98.7 degrees F oral Pulse rate:   73 / minute BP sitting:   120 / 56  (left arm) Cuff size:   regular  Vitals Entered By: Garen Grams LPN (October 22, 2010 1:45 PM) Is Patient Diabetic? No Pain Assessment Patient in pain? yes     Location: lower back   Primary Care Provider:  Rodney Langton MD   History of Present Illness: 70 yo female here with vaginal itching.  Vaginal itch:  She gets frequent yeast infections when tx with abx, recently went to ED with UTI, tx abx, developed sml amt vag discharge, no pain, no flank pain, no fevers/chills.  Usually resolves with diflucan.  DM2:  Due for A1c.  Cough: Getting over a viral URI, cough improving, had some LBP from cough but resolved.  HTN:  BP better on manual recheck.  Wants flu shot.  Habits & Providers  Alcohol-Tobacco-Diet     Alcohol drinks/day: 0     Tobacco Status: quit     Diet Comments: low sodium     Diet Counseling: to improve diet; diet is suboptimal  Current Medications (verified): 1)  Bayer Childrens Aspirin 81 Mg Chew (Aspirin) .... Take 1 Tablet By Mouth Once A Day 2)  Imdur 60 Mg Tb24 (Isosorbide Mononitrate) .... One Tab By Mouth Daily 3)  Lantus 100 Unit/ml Soln (Insulin Glargine) .[name].. 10 Units in The Morning 4)  Norvasc 10 Mg Tabs (Amlodipine Besylate) .... One Tab By Mouth Daily 5)  Synthroid 50 Mcg Tabs (Levothyroxine Sodium) .... One Tab By Mouth Daily 6)  Zocor 80 Mg Tabs (Simvastatin) .... One Tab By Mouth Daily 7)  Coreg 25 Mg Tabs (Carvedilol) .... One Tab By Mouth Bid 8)  Aimsco Insulin Syringe 31g X 5/16" 0.3 Ml  Misc (Insulin Syringe-Needle U-100) .[name].. 100 Use As Directed With Insulin 9)  Glipizide Xl 10 Mg Tb24 (Glipizide) .... 2 Tablet By Mouth Daily 10)  Flonase 50 Mcg/act  Susp (Fluticasone Propionate) .... 2 Sprays Each  Nostril Daily As Needed 11)  Lisinopril 40 Mg Tabs (Lisinopril) .[name].. 1 Tab By Mouth Once Daily 12)  Metformin Hcl 1000 Mg Tabs (Metformin Hcl) .[name].. 1 Tab By Mouth Two Times A Day 13)  Plavix 75 Mg Tabs (Clopidogrel Bisulfate) .... Ran Out 1 Tab By Mouth Once Daily 14)  Hydrochlorothiazide 25 Mg Tabs (Hydrochlorothiazide) .... One Tab By Mouth Daily 15)  Ferrous Sulfate 325 (65 Fe) Mg Tabs (Ferrous Sulfate) .... One Tab By Mouth Bid 16)  Nitrostat 0.4 Mg Subl (Nitroglycerin) .[name].. 1 Tablet Under Tongue At Onset of Chest Pain; You May Repeat Every 5 Minutes For Up To 3 Doses. 17)  Diflucan 150 Mg Tabs (Fluconazole) .... One Tab By Mouth X1  Allergies (verified): 1)  Septra Ds (Sulfamethoxazole-Trimethoprim) 2)  Codeine  Review of Systems       See HPI  Physical Exam  General:  Well-developed,well-nourished,in no acute distress; alert,appropriate and cooperative throughout examination Lungs:  Normal respiratory effort, chest expands symmetrically. Lungs are clear to auscultation, no crackles or wheezes. Heart:  Normal rate and regular rhythm. S1 and S2 normal without gallop, murmur, click, rub or other extra sounds. Abdomen:  Bowel sounds positive,abdomen soft and non-tender without masses, organomegaly or hernias noted.  Impression & Recommendations:  Problem # 1:  CANDIDIASIS, VULVOVAGINAL (ICD-112.1) Assessment New Symptoms suggestive of yeast infxn.   Will treat empirically with diflucan. If no better in 1-2 wks will come back and I can do a wet prep.  Her updated medication list for this problem includes:    Diflucan 150 Mg Tabs (Fluconazole) ..... One tab by mouth x1  Orders: [facility]- Est Level  3 (27035)  Problem # 2:  UPPER RESPIRATORY INFECTION, VIRAL (ICD-465.9) Assessment: Comment Only Resolving.  The following medications were removed from the medication list:    Claritin-d 24 Hour 10-240 Mg Xr24h-tab (Loratadine-pseudoephedrine) ..... One tab by mouth daily. use 5  days then stop. Her updated medication list for this problem includes:    Bayer Childrens Aspirin 81 Mg Chew (Aspirin) .[name]... Take 1 tablet by mouth once a day  Orders: [facility]- Est Level  3 (00938)  The following medications were removed from the medication list:    Claritin-d 24 Hour 10-240 Mg Xr24h-tab (Loratadine-pseudoephedrine) ..... One tab by mouth daily. use 5 days then stop. Her updated medication list for this problem includes:    Bayer Childrens Aspirin 81 Mg Chew (Aspirin) .[name]... Take 1 tablet by mouth once a day  Problem # 3:  DIABETES MELLITUS II, UNCOMPLICATED (ICD-250.00) Assessment: Unchanged  A1c today.  Her updated medication list for this problem includes:    Bayer Childrens Aspirin 81 Mg Chew (Aspirin) .[name]... Take 1 tablet by mouth once a day    Lantus 100 Unit/ml Soln (Insulin glargine) .Marland KitchenMarland KitchenMarland KitchenMarland Kitchen 10 units in the morning    Glipizide Xl 10 Mg Tb24 (Glipizide) .[name]... 2 tablet by mouth daily    Lisinopril 40 Mg Tabs (Lisinopril) .[name]... 1 tab by mouth once daily    Metformin Hcl 1000 Mg Tabs (Metformin hcl) .[name]... 1 tab by mouth two times a day  Orders: A1C-[facility] (18299)  Her updated medication list for this problem includes:    Bayer Childrens Aspirin 81 Mg Chew (Aspirin) .[name]... Take 1 tablet by mouth once a day    Lantus 100 Unit/ml Soln (Insulin glargine) .Marland KitchenMarland KitchenMarland KitchenMarland Kitchen 10 units in the morning    Glipizide Xl 10 Mg Tb24 (Glipizide) .[name]... 2 tablet by mouth daily    Lisinopril 40 Mg Tabs (Lisinopril) .[name]... 1 tab by mouth once daily    Metformin Hcl 1000 Mg Tabs (Metformin hcl) .[name]... 1 tab by mouth two times a day  Problem # 4:  HYPERTENSION, BENIGN SYSTEMIC (ICD-401.1) Assessment: Improved Controlled, no changes.  Her updated medication list for this problem includes:    Norvasc 10 Mg Tabs (Amlodipine besylate) ..... One tab by mouth daily    Coreg 25 Mg Tabs (Carvedilol) ..... One tab by mouth bid    Lisinopril 40 Mg Tabs (Lisinopril) .[name]... 1 tab by mouth once daily     Hydrochlorothiazide 25 Mg Tabs (Hydrochlorothiazide) ..... One tab by mouth daily  Complete Medication List: 1)  Bayer Childrens Aspirin 81 Mg Chew (Aspirin) .... Take 1 tablet by mouth once a day 2)  Imdur 60 Mg Tb24 (Isosorbide mononitrate) .... One tab by mouth daily 3)  Lantus 100 Unit/ml Soln (Insulin glargine) .[name].. 10 units in the morning 4)  Norvasc 10 Mg Tabs (Amlodipine besylate) .... One tab by mouth daily 5)  Synthroid 50 Mcg Tabs (Levothyroxine sodium) .... One tab by mouth daily 6)  Zocor 80 Mg Tabs (Simvastatin) .... One tab by mouth daily 7)  Coreg 25 Mg Tabs (Carvedilol) .... One tab by mouth bid  8)  Aimsco Insulin Syringe 31g X 5/16" 0.3 Ml Misc (Insulin syringe-needle u-100) .[name].. 100 use as directed with insulin 9)  Glipizide Xl 10 Mg Tb24 (Glipizide) .... 2 tablet by mouth daily 10)  Flonase 50 Mcg/act Susp (Fluticasone propionate) .... 2 sprays each nostril daily as needed 11)  Lisinopril 40 Mg Tabs (Lisinopril) .[name].. 1 tab by mouth once daily 12)  Metformin Hcl 1000 Mg Tabs (Metformin hcl) .[name].. 1 tab by mouth two times a day 13)  Plavix 75 Mg Tabs (Clopidogrel bisulfate) .... Ran out 1 tab by mouth once daily 14)  Hydrochlorothiazide 25 Mg Tabs (Hydrochlorothiazide) .... One tab by mouth daily 15)  Ferrous Sulfate 325 (65 Fe) Mg Tabs (Ferrous sulfate) .... One tab by mouth bid 16)  Nitrostat 0.4 Mg Subl (Nitroglycerin) .[name].. 1 tablet under tongue at onset of chest pain; you may repeat every 5 minutes for up to 3 doses. 17)  Diflucan 150 Mg Tabs (Fluconazole) .... One tab by mouth x1  Other Orders: Influenza Vaccine MCR (43329)  Patient Instructions: 1)  [facility] infection. 2)  Hb A1c. 3)  Flu shot. 4)  Come back to see me as needed. 5)  -Dr. Karie Schwalbe. Prescriptions: DIFLUCAN 150 MG TABS (FLUCONAZOLE) One tab by mouth x1  #1 x 0   Entered and Authorized by:   Rodney Langton MD   Signed by:   Rodney Langton MD on 10/22/2010   Method  used:   Electronically to        [facility] (retail)       [address]       [place], [place]  [address]       Ph: [phone]       Fax: [phone]   RxID:   [id number]    Orders Added: 1)  A1C-[facility] [83036] 2)  [facility]- Est Level  3 [31517] 3)  Influenza Vaccine MCR [00025]   Immunizations Administered:  Influenza Vaccine # 1:    Vaccine Type: Fluvax MCR    Site: left deltoid    Mfr: GlaxoSmithKline    Dose: 0.5 ml    Route: IM    Given by: Loralee Pacas CMA    Exp. Date: 06/19/2011    Lot #: [id number]    VIS given: 07/17/10 version given October 22, 2010.  Flu Vaccine Consent Questions:    Do you have a history of severe allergic reactions to this vaccine? no    Any prior history of allergic reactions to egg and/or gelatin? no    Do you have a sensitivity to the preservative Thimersol? no    Do you have a past history of Guillan-Barre Syndrome? no    Do you currently have an acute febrile illness? no    Have you ever had a severe reaction to latex? no    Vaccine information given and explained to patient? yes    Are you currently pregnant? no   Immunizations Administered:  Influenza Vaccine # 1:    Vaccine Type: Fluvax MCR    Site: left deltoid    Mfr: GlaxoSmithKline    Dose: 0.5 ml    Route: IM    Given by: Loralee Pacas CMA    Exp. Date: 06/19/2011    Lot #: [id number]    VIS given: 07/17/10 version given October 22, 2010.  Last Mammogram:  ASSESSMENT: Negative - BI-RADS 1^MM DIGITAL SCREENING (10/15/2010 9:07:00 AM) Mammogram  Next Due:  1 yr           Hypertension   Last Blood Pressure: 120 / 56  (10/22/2010)   Serum creatinine: 0.73  (01/05/2010)   Serum potassium 4.3  (01/05/2010)    Hypertension flowsheet reviewed?: Yes   Progress toward BP goal: At goal  Self-Management Support :   Personal Goals (by the next clinic visit) :     Personal A1C goal: 8  (03/28/2010)     Personal blood pressure goal:  130/80  (03/28/2010)     Personal LDL goal: 100  (03/28/2010)    Diabetes self-management support: Written self-care plan  (03/28/2010)   Last diabetes self-management training by diabetes educator: completed    Hypertension self-management support: Written self-care plan  (03/28/2010)    Lipid self-management support: Written self-care plan  (03/28/2010)    Appended Document: A1c results  Laboratory Results   Blood Tests   Date/Time Received: October 22, 2010 1:50 PM  Date/Time Reported: October 22, 2010 3:16 PM   HGBA1C: 7.8%   (Normal Range: Non-Diabetic - 3-6%   Control Diabetic - 6-8%)  Comments: ...........test performed by..........Marland KitchenLoralee Pacas, CMA entered by Terese Door, CMA

## 2011-01-22 NOTE — Letter (Signed)
Summary: Handout Printed  Printed Handout:  - Diabetes, Foot Care 

## 2011-01-22 NOTE — Letter (Signed)
Summary: Generic Letter  [facility]  [address]   [place], [place] [address]   Phone: [phone]  Fax: [phone]    03/29/2010  [name] [address] [place], [place]  [address]  Dear Ms. Lagan,   I have reviewed your complete blood count; your hemoglobin levels are the best they've been since 2008 and your platelet levels are also the best they have been since 2008.  I think the vitamin B12 shots worked and you should continue your iron tablets.       Sincerely,   Rodney Langton MD  Appended Document: Generic Letter mailed.

## 2011-01-22 NOTE — Miscellaneous (Signed)
   Clinical Lists Changes  Problems: Removed problem of CANDIDIASIS, VULVOVAGINAL (ICD-112.1) Removed problem of GASTRITIS (ICD-535.50) Removed problem of UPPER RESPIRATORY INFECTION, VIRAL (ICD-465.9) Removed problem of SCREENING FOR MALIGNANT NEOPLASM OF THE CERVIX (ICD-V76.2)

## 2011-01-22 NOTE — Letter (Signed)
Summary: Handout Printed  Printed Handout:  - Diabetes Self Education-[facility] 

## 2011-01-22 NOTE — Assessment & Plan Note (Signed)
Summary: per check out/sf      Allergies Added:   Visit Type:  Follow-up Primary Provider:  Rodney Langton MD  CC:  no complaints.  History of Present Illness: [name] is seen today in followup for her hypertension hypercholesterolemia diabetes and coronary artery disease.  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  She had recurrent angina in March 2010 and had a cutting balloon therapy of her diagonal branch by Dr. [name].  He is not had any recurrent pain.  Been compliant with her medications.  She does not follow her sugars closely she should.  Plan to her the connection between tight blood sugar control and improved patency of her stent.  Hemoglobin A1c on her.  She is on statin therapy.  Her lites are helps been fine.  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  Does carry nitroglycerin with her. She needs blood work but would like to do it on a day that she is at [facility] for her B12 shot   Current Problems (verified): 1)  Eustachian Tube Dysfunction, Left  (ICD-381.81) 2)  Vitamin B12 Deficiency  (ICD-266.2) 3)  Dysphagia Unspecified  (ICD-787.20) 4)  Gastritis  (ICD-535.50) 5)  Postmenopausal Status  (ICD-V49.81) 6)  Chest Wall Pain, Anterior  (ICD-786.52) 7)  Eustachian Tube Dysfunction, Left  (ICD-381.81) 8)  Upper Respiratory Infection, Viral  (ICD-465.9) 9)  Screening For Malignant Neoplasm of The Cervix  (ICD-V76.2) 10)  Dysuria  (ICD-788.1) 11)  Hypothyroidism, Unspecified  (ICD-244.9) 12)  Hypertension, Benign Systemic  (ICD-401.1) 13)  Hypercholesterolemia  (ICD-272.0) 14)  Gastroesophageal Reflux, No Esophagitis  (ICD-530.81) 15)  Diabetes Mellitus II, Uncomplicated  (ICD-250.00) 16)  Coronary, Arteriosclerosis  (ICD-414.00) 17)  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 Take 1 Tablet By Mouth Once A Day 2)  Imdur 60 Mg Tb24 (Isosorbide Mononitrate) .... One Tab By Mouth Daily 3)  Lantus 100 Unit/ml Soln (Insulin Glargine) .[name].. 10 Units in The Morning 4)  Norvasc 10 Mg Tabs (Amlodipine Besylate) .... One Tab By Mouth Daily 5)  Synthroid 50 Mcg Tabs (Levothyroxine Sodium) .... One Tab By Mouth Daily 6)  Zocor 80 Mg Tabs (Simvastatin) .... One Tab By Mouth Daily 7)  Coreg 25 Mg Tabs (Carvedilol) .... One Tab By Mouth Bid 8)  Aimsco Insulin Syringe 31g X 5/16" 0.3 Ml  Misc (Insulin Syringe-Needle U-100) .[name].. 100 Use As Directed With Insulin 9)  Glipizide Xl 10 Mg Tb24 (Glipizide) .... 2 Tablet By Mouth Daily 10)  Flonase 50 Mcg/act  Susp (Fluticasone Propionate) .... 2 Sprays Each Nostril Daily 11)  Lisinopril 40 Mg Tabs (Lisinopril) .[name].. 1 Tab By Mouth Once Daily 12)  Metformin Hcl 1000 Mg Tabs (Metformin Hcl) .[name].. 1 Tab By Mouth Two Times A Day 13)  Plavix 75 Mg Tabs (Clopidogrel Bisulfate) .[name].. 1 Tab By Mouth Once Daily 14)  Hydrochlorothiazide 25 Mg Tabs (Hydrochlorothiazide) .... One Tab By Mouth Daily 15)  Ferrous Sulfate 325 (65 Fe) Mg Tabs (Ferrous Sulfate) .... One Tab By Mouth Bid 16)  Vitamin B12 (Cyanocobalamin) .... Intramuscular Injection Qdaily X 1week, Then [employer], Then [employer] X 6 Months. 17)  Claritin-D 24 Hour 10-240 Mg Xr24h-Tab (Loratadine-Pseudoephedrine) .... One Tab By Mouth Daily. Use 5 Days Then Stop. 18)  Simply Saline 0.9 %  Aers (Saline) .... Spray 2 Sprays in Each Nostril 4 Times A Day As Needed.  Allergies (verified): 1)  Septra Ds (Sulfamethoxazole-Trimethoprim) 2)  Codeine  Past History:  Past Medical History: Last updated: 03/29/2009 Cath 11/01 Angioplasty PDA 90-40%, Former chewing tobacco use - quit 9/95, Hx thyroid CA, s/p thyroid surg, with hypothyroidi, LFTs 18/20 05-16-2005, NQWMI 11/01, Postmenopausal bleeding 3/99 with neg bx, TSH wnl 05-16-2005 GASTRITIS (ICD-535.50) POSTMENOPAUSAL  STATUS (ICD-V49.81) CHEST WALL PAIN, ANTERIOR (ICD-786.52) EUSTACHIAN TUBE DYSFUNCTION, LEFT (ICD-381.81) UPPER RESPIRATORY INFECTION, VIRAL (ICD-465.9) SCREENING FOR MALIGNANT NEOPLASM OF THE CERVIX (ICD-V76.2) DYSURIA (ICD-788.1) HYPOTHYROIDISM, UNSPECIFIED (ICD-244.9) HYPERTENSION, BENIGN SYSTEMIC (ICD-401.1) HYPERCHOLESTEROLEMIA (ICD-272.0) GASTROESOPHAGEAL REFLUX, NO ESOPHAGITIS (ICD-530.81) DIABETES MELLITUS II, UNCOMPLICATED (ICD-250.00) CORONARY, ARTERIOSCLEROSIS (ICD-414.00) 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 Keratosis, no CA - 09/13/2004, TSH 0.225, free T4 1.33 - 05/24/2004  03/15/09 Cath by Dr. Juanda Chance, 80-90% stenosis of diagonal branch of LAD.  PTCA performed.  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 All other systems reviewed and  negative  Vital Signs:  Patient profile:   70 year old female Height:      68 inches Weight:      161 pounds BMI:     24.57 Pulse rate:   75 / minute BP sitting:   132 / 67  (left arm) Cuff size:   regular  Vitals Entered By: Burnett Kanaris, CNA (December 27, 2009 11:23 AM)  Physical Exam  General:  Affect appropriate Healthy:  appears stated age HEENT: normal Neck supple with no adenopathy JVP normal no bruits no thyromegaly Lungs clear with no wheezing and good diaphragmatic motion Heart:  S1/S2 no murmur,rub, gallop or click PMI normal Abdomen: benighn, BS positve, no tenderness, no AAA no bruit.  No HSM or HJR Distal pulses intact with no bruits No edema Neuro non-focal Skin warm and dry    Impression & Recommendations:  Problem # 1:  HYPERTENSION, BENIGN SYSTEMIC (ICD-401.1) Well controlled continue low sodium diet Her updated medication list for this problem includes:    Bayer Childrens Aspirin 81 Mg Chew (Aspirin) .[name]... Take 1 tablet by mouth once a day    Norvasc 10 Mg Tabs (Amlodipine besylate) ..... One tab by mouth daily    Coreg 25 Mg Tabs (Carvedilol) ..... One tab by mouth bid    Lisinopril 40 Mg Tabs (Lisinopril) .[name]... 1 tab by mouth once daily    Hydrochlorothiazide 25 Mg Tabs (Hydrochlorothiazide) ..... One tab by mouth  daily  Her updated medication list for this problem includes:    Bayer Childrens Aspirin 81 Mg Chew (Aspirin) .[name]... Take 1 tablet by mouth once a day    Norvasc 10 Mg Tabs (Amlodipine besylate) ..... One tab by mouth daily    Coreg 25 Mg Tabs (Carvedilol) ..... One tab by mouth bid    Lisinopril 40 Mg Tabs (Lisinopril) .[name]... 1 tab by mouth once daily    Hydrochlorothiazide 25 Mg Tabs (Hydrochlorothiazide) ..... One tab by mouth daily  Problem # 2:  HYPERCHOLESTEROLEMIA (ICD-272.0) Continue statin. Target LDL less than 100  Labs next week.  Consider changing to crestor with reports of lft abnormalities on high dose zocor Her updated  medication list for this problem includes:    Zocor 80 Mg Tabs (Simvastatin) ..... One tab by mouth daily  Problem # 3:  CORONARY, ARTERIOSCLEROSIS (ICD-414.00) Repeat intervention to diagonal 02/2009  Has nitrol  No angina  Continue medical Rx Her updated medication list for this problem includes:    Bayer Childrens Aspirin 81 Mg Chew (Aspirin) .[name]... Take 1 tablet by mouth once a day    Imdur 60 Mg Tb24 (Isosorbide mononitrate) ..... One tab by mouth daily    Norvasc 10 Mg Tabs (Amlodipine besylate) ..... One tab by mouth daily    Coreg 25 Mg Tabs (Carvedilol) ..... One tab by mouth bid    Lisinopril 40 Mg Tabs (Lisinopril) .[name]... 1 tab by mouth once daily    Plavix 75 Mg Tabs (Clopidogrel bisulfate) .[name]... 1 tab by mouth once daily  Patient Instructions: 1)  Your physician recommends that you schedule a follow-up appointment in: 6 MONTHS WITH DR Eden Emms 2)  Your physician recommends that you return for lab work XL:KGMW HGBA1C LIPID LIVER 272.4 V58.69 3)  Your physician recommends that you continue on your current medications as directed. Please refer to the Current Medication list given to you today.

## 2011-01-22 NOTE — Progress Notes (Signed)
Summary: Rx Req   Phone Note Refill Request Call back at Home Phone [phone] Message from:  Patient  Refills Requested: Medication #1:  LISINOPRIL 40 MG TABS 1 tab by mouth once daily Pt is out.  [facility].  Initial call taken by: Clydell Hakim,  May 08, 2010 9:55 AM    Prescriptions: LISINOPRIL 40 MG TABS (LISINOPRIL) 1 tab by mouth once daily  #30 Each x 7   Entered and Authorized by:   Rodney Langton MD   Signed by:   Rodney Langton MD on 05/08/2010   Method used:   Electronically to        [facility] (retail)       [address]       [place], [place]  [address]       Ph: [phone]       Fax: [phone]   RxID:   [id number]

## 2011-01-22 NOTE — Letter (Signed)
Summary: Generic Letter  [facility]  [address]   [place], [place] [address]   Phone: [phone]  Fax: [phone]    03/08/2010  [name] [address] [place], [place]  [address]  Dear Ms. Quezada,   You are overdue for some preventive medical tests.  Please make an appt to see me as soon as is convenient for you.      Sincerely,   Rodney Langton MD

## 2011-01-22 NOTE — Assessment & Plan Note (Signed)
 Summary: fluid on ear,df   Vital Signs:  Patient profile:   70 year old female Height:      63 inches Weight:      166.9 pounds BMI:     29.67 Temp:     98 degrees F Pulse rate:   76 / minute BP sitting:   124 / 58  (left arm)  Vitals Entered By: Avelina Sharps RN (August 01, 2009 1:36 PM) CC: roaring in ears Is Patient Diabetic? Yes  Pain Assessment Patient in pain? no        Primary Care Provider:  DEBBY PETTIES MD  CC:  roaring in ears.  History of Present Illness: 75F c/o roaring in ears.  has been going on for about 2 weeks now.  Mostly in left ear, constant and NOT pulsatile.  Recently had a sore throat/cold about 2 weeks ago as well.  Has fullness in ears, no drainage, no HA, change in hearing, fevers/chills, dysequilibrium, nausea, neck pain/stiffness.  Associated symptoms include itchy watery eyes.  Habits & Providers  Alcohol-Tobacco-Diet     Tobacco Status: quit  Allergies: 1)  Septra Ds (Sulfamethoxazole-Trimethoprim) 2)  Codeine  Social History: Smoking Status:  quit  Review of Systems       See HPI  Physical Exam  General:  Well-developed,well-nourished,in no acute distress; alert,appropriate and cooperative throughout examination Head:  Normocephalic and atraumatic without obvious abnormalities.  Eyes:  No corneal or conjunctival inflammation noted. EOMI. Perrla.  Ears:  External ear exam shows no significant lesions or deformities.  Otoscopic examination reveals clear canals, tympanic membranes are intact bilaterally without bulging, retraction, inflammation or discharge. Hearing is grossly normal bilaterally.  Rhinne and Weber tests reveal no lateralization.  No mastoid process tenderness. Nose:  External nasal examination shows no deformity or inflammation. Nasal mucosa are pink and moist without lesions or exudates. Mouth:  Oral mucosa and oropharynx without lesions or exudates.  Lungs:  Normal respiratory effort, chest expands  symmetrically. Lungs are clear to auscultation, no crackles or wheezes. Heart:  Normal rate and regular rhythm. S1 and S2 normal without gallop, murmur, click, rub or other extra sounds. Neurologic:  Grossly non-focal. 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  Will try Oral antihistamine/decongestant as well as nasal saline spray.  Pt to RTC if no improvement in 5 days.  No warning signs.  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  If no improvement then can consider referral to audiologist or ENT.   Orders: [facility]- Est Level  3 (00786)  Complete Medication List: 1)  Bayer Childrens Aspirin  81 Mg Chew (Aspirin ) .... Take 1 tablet by mouth once a day 2)  Imdur  60 Mg Tb24 (Isosorbide  mononitrate) .... One tab by mouth daily 3)  Lantus  100 Unit/ml Soln (Insulin  glargine) .... 8 units in the morning 4)  Norvasc  10 Mg Tabs (Amlodipine  besylate) .... One tab by mouth daily 5)  Synthroid  50 Mcg Tabs (Levothyroxine  sodium) .... One tab by mouth daily 6)  Zocor  80 Mg Tabs (Simvastatin ) .... One tab by mouth daily 7)  Coreg  25 Mg Tabs (Carvedilol ) .... One tab by mouth bid 8)  Aimsco Insulin  Syringe 31g X 5/16 0.3 Ml Misc (Insulin  syringe-needle u-100) .SABRA.. 100 use as directed with insulin  9)  Glipizide Xl 10 Mg Tb24 (  Glipizide) .... 2 tablet by mouth daily 10)  Flonase  50 Mcg/act Susp (Fluticasone  propionate) .... 2 sprays each nostril daily 11)  Lisinopril  40 Mg Tabs (Lisinopril ) .SABRA.. 1 tab by mouth once daily 12)  Metformin  Hcl 1000 Mg Tabs (Metformin  hcl) .SABRA.. 1 tab by mouth two times a day 13)  Plavix 75 Mg Tabs (Clopidogrel bisulfate) .SABRA.. 1 tab by mouth once daily 14)  Hydrochlorothiazide  25 Mg Tabs (Hydrochlorothiazide ) .... One tab by mouth  daily 15)  Ferrous Sulfate  325 (65 Fe) Mg Tabs (Ferrous sulfate ) .... One tab by mouth bid 16)  Vitamin B12 (cyanocobalamin ) 1000mcg  .... Intramuscular injection qdaily x 1week, then qweekly x1 month, then qmonthly x 6 months. 17)  Claritin -d 24 Hour 10-240 Mg Xr24h-tab (Loratadine -pseudoephedrine ) .... One tab by mouth daily. use 5 days then stop. 18)  Simply Saline 0.9 % Aers (Saline) .... Spray 2 sprays in each nostril 4 times a day as needed.  Patient Instructions: 1)  Great to see you . 2)  Try the Claritin  D daily for 5 days.  This should fix your symptoms.  I also want you to spray nasal saline in each nostril. 3)  Come back to see me if this hasn't helped in 5 days. 4)  -Dr. ONEIDA. Prescriptions: SIMPLY SALINE 0.9 % AERS (SALINE) Spray 2 sprays in each nostril 4 times a day as needed.  #1 bottle x 0   Entered and Authorized by:   Debby Petties MD   Signed by:   Debby Petties MD on 08/01/2009   Method used:   Electronically to        [facility] (retail)       [address]       [place], [place]  [address]       Ph: [phone]       Fax: [phone]   RxID:   [id number] CLARITIN -D 24 HOUR 10-240 MG XR24H-TAB (LORATADINE -PSEUDOEPHEDRINE ) One tab by mouth daily. Use 5 days then stop.  #1 box x 0   Entered and Authorized by:   Debby Petties MD   Signed by:   Debby Petties MD on 08/01/2009   Method used:   Electronically to        [facility] (retail)       [address]       [place], [place]  [address]       Ph: [phone]       Fax: [phone]   RxID:   [id number]    Vital Signs:  Patient profile:   70 year old female Height:      63 inches Weight:      166.9 pounds BMI:     29.67 Temp:     98 degrees F Pulse rate:   76 / minute BP sitting:   124 / 58  (left arm)  Vitals Entered By: Avelina Sharps RN (August 01, 2009 1:36 PM)

## 2011-01-22 NOTE — Assessment & Plan Note (Signed)
 Summary: 3 mo. f/u, df   Vital Signs:  Patient profile:   70 year old female Height:      62.75 inches Weight:      170 pounds BMI:     30.46 Temp:     98.3 degrees F oral Pulse rate:   80 / minute BP sitting:   150 / 75  Vitals Entered By: Ginnie Mau RN (April 03, 2009 8:26 AM)   History of Present Illness: 66F here for routine fu, recent hospitalization for CP, DM, HTN, Sensation of lump in throat.  Recent hospitalization:  Admitted to cardiology service for CP, had cath, found 80-90% stenosis diagonal branch.  Angioplasty done by Dr. Wolm Nida, no stenting.  No episodes of CP since cath.  Taking Imdur .  DM: Continues to be uncontrolled but better.  Compliant with meds.  A1c was 7.8, 3 months ago.    HTN: Currently on Lisinopril , Norvasc , Coreg  for HTN, no longer on HCTZ.  BP elevated today.  Sensation of lump in throat:  Described as food sometimes getting caught in anterior neck, right side, no halitosis, no regurgitation of food, pt endorses that she can occasionally feel a mass.  No difficulty swallowing per se but more discomfort.  No changes in weight.    Habits & Providers     Alcohol drinks/day: 0     Tobacco Status: quit > 6 months     Diet Comments: low sodium     Diet Counseling: to improve diet; diet is suboptimal     Does Patient Exercise: yes     Exercise (avg: min/session): <30     Times/week: <3     Have you felt down or hopeless? no     Have you felt little pleasure in things? no     Depression Counseling: not indicated; screening negative for depression     STD Risk: never     Drug Use: never     Seat Belt Use: always     Sun Exposure: rarely  Comments: does not follow diabetic diet, just started cardiac rehab 10 minutes at a time  Allergies: 1)  Septra Ds (Sulfamethoxazole-Trimethoprim) 2)  Codeine  Past History:  Past Medical History:    Cath 11/01 Angioplasty PDA 90-40%, Former chewing tobacco use - quit 9/95, Hx thyroid  CA, s/p  thyroid  surg, with hypothyroidi, LFTs 18/20 04/2005, NQWMI 11/01, Postmenopausal bleeding 3/99 with neg bx, TSH wnl 04/2005 (02/19/2007)  Past Surgical History:    Barium Enema -, bilateral tubal ligation -, breast biopsy -, Cardiac Cath, PTCA 11/01 50% LAD, PDA 90 - 30% - 10/23/2000, Cardiac Cath: EF 60%; RCA 40%,LAD30%,L main 20% - 03/15/2003, cardiolyte - neg for ischemia EF 83% - 05/23/2001, Cholecystectomy - 08/23/2002, Colonoscopy: WNL by Dyane - 08/11/2003, echo- ef 55% - 12/19/2006, Endometrial Biopsy 3/99 - neg -, Hgb 10.8 - 04/22/2004, I&D, bartholin`s cyst -, lesion removal: sebb. Keratosis, no CA - 09/13/2004, TSH 0.225, free T4 1.33 - 05/24/2004        03/15/09 Cath by Dr. Nida, 80-90% stenosis of diagonal branch of LAD.  PTCA performed.  Social History:    Smoking Status:  quit > 6 months    Drug Use:  never    STD Risk:  never    Seat Belt Use:  always    Sun Exposure-Excessive:  rarely  Review of Systems       12 point negative except as in HPI.  Physical Exam  General:  Well-developed,well-nourished,in no acute  distress; alert,appropriate and cooperative throughout examination Lungs:  Normal respiratory effort, chest expands symmetrically. Lungs are clear to auscultation, no crackles or wheezes. Heart:  Normal rate and regular rhythm. S1 and S2 normal without gallop, murmur, click, rub or other extra sounds. Abdomen:  Bowel sounds positive,abdomen soft and non-tender without masses, organomegaly or hernias noted. Extremities:  No clubbing, cyanosis, edema, or deformity noted with normal full range of motion of all joints.     Impression & Recommendations:  Problem # 1:  CORONARY, ARTERIOSCLEROSIS (ICD-414.00) Assessment Deteriorated Recent discharge, asymptomatic now.  Risk management is imperative now.  For her, controlling her diabetes and HTN will be my main goals at this time.  Pt aware of warning symptoms that should prompt an ER visit.  On aspirin , plavix, and  B-blocker.  Her updated medication list for this problem includes:    Bayer Childrens Aspirin  81 Mg Chew (Aspirin ) .SABRA... Take 1 tablet by mouth once a day    Imdur  60 Mg Tb24 (Isosorbide  mononitrate) ..... One tab by mouth daily    Norvasc  10 Mg Tabs (Amlodipine  besylate) ..... One tab by mouth daily    Coreg  25 Mg Tabs (Carvedilol ) ..... One tab by mouth bid    Lisinopril  40 Mg Tabs (Lisinopril ) .SABRA... 1 tab by mouth once daily    Plavix 75 Mg Tabs (Clopidogrel bisulfate) .SABRA... 1 tab by mouth once daily    Hydrochlorothiazide  12.5 Mg Caps (Hydrochlorothiazide ) ..... One tab by mouth daily  Orders: [facility]- Est  Level 4 (00785)  Problem # 2:  DIABETES MELLITUS II, UNCOMPLICATED (ICD-250.00) Assessment: Improved Improved A1c (7.2) since 3 months ago.  Re-emphasized importance of healthy eating.  Will recheck A1c in 3 months, if still elevated will increase metformin  and glipizide XL.  Will not increase today as she has shown an improvement from the last A1c.   Goal <6.5  Her updated medication list for this problem includes:    Bayer Childrens Aspirin  81 Mg Chew (Aspirin ) .SABRA... Take 1 tablet by mouth once a day    Lantus  100 Unit/ml Soln (Insulin  glargine) .SABRA... 8 units in the morning    Glipizide Xl 10 Mg Tb24 (Glipizide) .SABRA... 1 tablet by mouth daily    Lisinopril  40 Mg Tabs (Lisinopril ) .SABRA... 1 tab by mouth once daily    Metformin  Hcl 1000 Mg Tabs (Metformin  hcl) .SABRA... 1 tab by mouth two times a day  Orders: A1C-[facility] (16963) [facility]- Est  Level 4 (00785)  Problem # 3:  HYPERTENSION, BENIGN SYSTEMIC (ICD-401.1) Assessment: Deteriorated BP still elevated above goal of <130/80.  Pt recently taken off HCTZ and lisinopril  increased.  Will re-add HCTZ 12.5 and titrate to BPs on subsequent visits.  Her updated medication list for this problem includes:    Norvasc  10 Mg Tabs (Amlodipine  besylate) ..... One tab by mouth daily    Coreg  25 Mg Tabs (Carvedilol ) ..... One tab by mouth bid     Lisinopril  40 Mg Tabs (Lisinopril ) .SABRA... 1 tab by mouth once daily    Hydrochlorothiazide  12.5 Mg Caps (Hydrochlorothiazide ) ..... One tab by mouth daily  Orders: [facility]- Est  Level 4 (00785)  Problem # 4:  DYSPHAGIA UNSPECIFIED (ICD-787.20) Unknown etiology at this time.  No lump or mass palpable on physical exam and pt able to eat normally, no choking, with occasional sensation of food stuck in upper throat.  If still present in 2 weeks at next visit will need to image with barium esophagogram, r/o diverticulum/neoplasm.  Drink lots  of water until then.   Complete Medication List: 1)  Bayer Childrens Aspirin  81 Mg Chew (Aspirin ) .... Take 1 tablet by mouth once a day 2)  Imdur  60 Mg Tb24 (Isosorbide  mononitrate) .... One tab by mouth daily 3)  Lantus  100 Unit/ml Soln (Insulin  glargine) .... 8 units in the morning 4)  Norvasc  10 Mg Tabs (Amlodipine  besylate) .... One tab by mouth daily 5)  Synthroid  50 Mcg Tabs (Levothyroxine  sodium) .... One tab by mouth daily 6)  Zocor  80 Mg Tabs (Simvastatin ) .... One tab by mouth daily 7)  Coreg  25 Mg Tabs (Carvedilol ) .... One tab by mouth bid 8)  Aimsco Insulin  Syringe 31g X 5/16 0.3 Ml Misc (Insulin  syringe-needle u-100) .SABRA.. 100 use as directed with insulin  9)  Glipizide Xl 10 Mg Tb24 (Glipizide) .SABRA.. 1 tablet by mouth daily 10)  Flonase  50 Mcg/act Susp (Fluticasone  propionate) .... 2 sprays each nostril daily 11)  Keflex 500 Mg Caps (Cephalexin) .... Take one tablet two times a day for 7 days 12)  Lisinopril  40 Mg Tabs (Lisinopril ) .SABRA.. 1 tab by mouth once daily 13)  Metformin  Hcl 1000 Mg Tabs (Metformin  hcl) .SABRA.. 1 tab by mouth two times a day 14)  Plavix 75 Mg Tabs (Clopidogrel bisulfate) .SABRA.. 1 tab by mouth once daily 15)  Hydrochlorothiazide  12.5 Mg Caps (Hydrochlorothiazide ) .... One tab by mouth daily  Patient Instructions: 1)  Great to see you today, 2)  I'm glad you're feeling better after your hospitalization.  Be sure to go to the ER if  you start to have chest pain. 3)  I will add hydrochlorothiazide  to your medication list, and have you come back to see me in 2 weeks to recheck your blood pressure.  I will increase your HCTZ again if its still high. 4)  Be sure to continue to eat well.  We discussed healthy diets today.  Your A1c was better, but still not in the idea range or <6.5. 5)  Be sure to drink LOTS of water with your medications.  Norvasc  can cause the sensation of a lump in your throat.  If it hasn't gotten better in 2 weeks when you come see me again I will investigate the cause further with imaging. 6)  -Dr. ONEIDA. Prescriptions: HYDROCHLOROTHIAZIDE  12.5 MG CAPS (HYDROCHLOROTHIAZIDE ) One tab by mouth daily  #30 x 6   Entered and Authorized by:   Debby Petties MD   Signed by:   Debby Petties MD on 04/03/2009   Method used:   Electronically to        [facility] (retail)       [address]       [place], [place]  [address]       Ph: [phone]       Fax: [phone]   RxID:   [id number]   Laboratory Results   Blood Tests   Date/Time Received: April 03, 2009 8:42 AM  Date/Time Reported: April 03, 2009 8:57 AM   HGBA1C: 7.2%   (Normal Range: Non-Diabetic - 3-6%   Control Diabetic - 6-8%)  Comments: ...........test performed by...........SABRAArland Morel, CMA

## 2011-01-22 NOTE — Assessment & Plan Note (Signed)
Summary: sore throat/eye infection,tcb   Vital Signs:  Patient profile:   70 year old female Weight:      167.9 pounds Temp:     99.1 degrees F oral Pulse rate:   76 / minute BP sitting:   126 / 73  (left arm) Cuff size:   regular  Vitals Entered By: Loralee Pacas CMA (July 11, 2010 10:10 AM) CC: sore throat Comments sore throat, both eyes are infected   Primary Care Provider:  Rodney Langton MD  CC:  sore throat.  History of Present Illness: 1-2wk hx red eyes, not painful or scratchy, watery/mucousy discharge, stuck closed in AM.  No visual changes.  No pus, R eye became red today, L eye was red 2d ago on Monday.  Also with some non-bloody diarrhea x 2d, mild Sore throat.  Mild cough.  Great grandson lives with her and has ST as well.    Taking by mouth, no other complaints.  No SOB, no CP, no HA, no stiff neck, no abd pain, no dysuria, no joint pains.  Current Medications (verified): 1)  Bayer Childrens Aspirin 81 Mg Chew (Aspirin) .... Take 1 Tablet By Mouth Once A Day 2)  Imdur 60 Mg Tb24 (Isosorbide Mononitrate) .... One Tab By Mouth Daily 3)  Lantus 100 Unit/ml Soln (Insulin Glargine) .[name].. 10 Units in The Morning 4)  Norvasc 10 Mg Tabs (Amlodipine Besylate) .... One Tab By Mouth Daily 5)  Synthroid 50 Mcg Tabs (Levothyroxine Sodium) .... One Tab By Mouth Daily 6)  Zocor 80 Mg Tabs (Simvastatin) .... One Tab By Mouth Daily 7)  Coreg 25 Mg Tabs (Carvedilol) .... One Tab By Mouth Bid 8)  Aimsco Insulin Syringe 31g X 5/16" 0.3 Ml  Misc (Insulin Syringe-Needle U-100) .[name].. 100 Use As Directed With Insulin 9)  Glipizide Xl 10 Mg Tb24 (Glipizide) .... 2 Tablet By Mouth Daily 10)  Flonase 50 Mcg/act  Susp (Fluticasone Propionate) .... 2 Sprays Each Nostril Daily As Needed 11)  Lisinopril 40 Mg Tabs (Lisinopril) .[name].. 1 Tab By Mouth Once Daily 12)  Metformin Hcl 1000 Mg Tabs (Metformin Hcl) .[name].. 1 Tab By Mouth Two Times A Day 13)  Plavix 75 Mg Tabs (Clopidogrel Bisulfate) ....  Ran Out 1 Tab By Mouth Once Daily 14)  Hydrochlorothiazide 25 Mg Tabs (Hydrochlorothiazide) .... One Tab By Mouth Daily 15)  Ferrous Sulfate 325 (65 Fe) Mg Tabs (Ferrous Sulfate) .... One Tab By Mouth Bid 16)  Ferrous Sulfate 325 (65 Fe) Mg  Tabs (Ferrous Sulfate) .[name].. 1 Tab By Mouth Once Daily 17)  Claritin-D 24 Hour 10-240 Mg Xr24h-Tab (Loratadine-Pseudoephedrine) .... One Tab By Mouth Daily. Use 5 Days Then Stop. 18)  Simply Saline 0.9 % Aers (Saline) .... Spray 2 Sprays in Each Nostril 4 Times A Day As Needed. 19)  Nitrostat 0.4 Mg Subl (Nitroglycerin) .[name].. 1 Tablet Under Tongue At Onset of Chest Pain; You May Repeat Every 5 Minutes For Up To 3 Doses.  Allergies (verified): 1)  Septra Ds (Sulfamethoxazole-Trimethoprim) 2)  Codeine  Review of Systems       See hPI  Physical Exam  General:  Well-developed,well-nourished,in no acute distress; alert,appropriate and cooperative throughout examination Head:  Normocephalic and atraumatic without obvious abnormalities.  Eyes:  No corneal inflammation noted. EOMI. Perrla.  Conjunctivae mildly injected, no discharge. Ears:  External ear exam shows no significant lesions or deformities.  Otoscopic examination reveals clear canals, tympanic membranes are intact bilaterally without bulging, retraction, inflammation or discharge. Hearing is grossly  normal bilaterally. Nose:  External nasal examination shows no deformity or inflammation. Nasal mucosa are pink and moist without lesions or exudates. Mouth:  Oral mucosa and oropharynx without lesions or exudates. Neck:  No deformities, masses, or tenderness noted. Lungs:  Normal respiratory effort, chest expands symmetrically. Lungs are clear to auscultation, no crackles or wheezes. Heart:  Normal rate and regular rhythm. S1 and S2 normal without gallop, murmur, click, rub or other extra sounds. Abdomen:  Bowel sounds positive,abdomen soft and non-tender without masses, organomegaly or hernias  noted.   Impression & Recommendations:  Problem # 1:  VIRAL CONJUNCTIVITIS (ICD-077.99) Assessment New Supportive care, analgesics, hydration.  No abx needed as this is not bacterial esp when associated with ST, loose stools.  RTC if not starting to get better in 1 week.  Orders: [facility]- Est Level  3 ([address]109)  Complete Medication List: 1)  Bayer Childrens Aspirin 81 Mg Chew (Aspirin) .... Take 1 tablet by mouth once a day 2)  Imdur 60 Mg Tb24 (Isosorbide mononitrate) .... One tab by mouth daily 3)  Lantus 100 Unit/ml Soln (Insulin glargine) .[name].. 10 units in the morning 4)  Norvasc 10 Mg Tabs (Amlodipine besylate) .... One tab by mouth daily 5)  Synthroid 50 Mcg Tabs (Levothyroxine sodium) .... One tab by mouth daily 6)  Zocor 80 Mg Tabs (Simvastatin) .... One tab by mouth daily 7)  Coreg 25 Mg Tabs (Carvedilol) .... One tab by mouth bid 8)  Aimsco Insulin Syringe 31g X 5/16" 0.3 Ml Misc (Insulin syringe-needle u-100) .[name].. 100 use as directed with insulin 9)  Glipizide Xl 10 Mg Tb24 (Glipizide) .... 2 tablet by mouth daily 10)  Flonase 50 Mcg/act Susp (Fluticasone propionate) .... 2 sprays each nostril daily as needed 11)  Lisinopril 40 Mg Tabs (Lisinopril) .[name].. 1 tab by mouth once daily 12)  Metformin Hcl 1000 Mg Tabs (Metformin hcl) .[name].. 1 tab by mouth two times a day 13)  Plavix 75 Mg Tabs (Clopidogrel bisulfate) .... Ran out 1 tab by mouth once daily 14)  Hydrochlorothiazide 25 Mg Tabs (Hydrochlorothiazide) .... One tab by mouth daily 15)  Ferrous Sulfate 325 (65 Fe) Mg Tabs (Ferrous sulfate) .... One tab by mouth bid 16)  Ferrous Sulfate 325 (65 Fe) Mg Tabs (Ferrous sulfate) .[name].. 1 tab by mouth once daily 17)  Claritin-d 24 Hour 10-240 Mg Xr24h-tab (Loratadine-pseudoephedrine) .... One tab by mouth daily. use 5 days then stop. 18)  Simply Saline 0.9 % Aers (Saline) .... Spray 2 sprays in each nostril 4 times a day as needed. 19)  Nitrostat 0.4 Mg Subl (Nitroglycerin) .[name].. 1 tablet  under tongue at onset of chest pain; you may repeat every 5 minutes for up to 3 doses.

## 2011-01-22 NOTE — Letter (Signed)
Summary: [facility] - Lipid  [facility], Main Office  [address]   [place], [place] [address]   Phone: [phone]  Fax: [phone]     January 10, 2010 MRN: [id number]   [name] [address] [place], [place]  [address]   Dear Ms. Poliquin,  We have reviewed your cholesterol results.  They are as follows:     Total Cholesterol:    137 (Desirable: less than 200)       HDL  Cholesterol:     47  (Desirable: greater than 40 for men and 50 for women)       LDL Cholesterol:       72  (Desirable: less than 100 for low risk and less than 70 for moderate to high risk)       Triglycerides:       91  (Desirable: less than 150)  Our recommendations include:These numbers look good. Continue on the same medicine. Liver function is normal. Take care, Dr. Leanora Cover.    Call our office at the number listed above if you have any questions.  Lowering your LDL cholesterol is important, but it is only one of a large number of "risk factors" that may indicate that you are at risk for heart disease, stroke or other complications of hardening of the arteries.  Other risk factors include:   A.  Cigarette Smoking* B.  High Blood Pressure* C.  Obesity* D.   Low HDL Cholesterol (see yours above)* E.   Diabetes Mellitus (higher risk if your is uncontrolled) F.  Family history of premature heart disease G.  Previous history of stroke or cardiovascular disease    *These are risk factors YOU HAVE CONTROL OVER.  For more information, visit .  There is now evidence that lowering the TOTAL CHOLESTEROL AND LDL CHOLESTEROL can reduce the risk of heart disease.  The American Heart Association recommends the following guidelines for the treatment of elevated cholesterol:  1.  If there is now current heart disease and less than two risk factors, TOTAL CHOLESTEROL should be less than 200 and LDL CHOLESTEROL should be less than 100. 2.  If there is current heart disease or two  or more risk factors, TOTAL CHOLESTEROL should be less than 200 and LDL CHOLESTEROL should be less than 70.  A diet low in cholesterol, saturated fat, and calories is the cornerstone of treatment for elevated cholesterol.  Cessation of smoking and exercise are also important in the management of elevated cholesterol and preventing vascular disease.  Studies have shown that 30 to 60 minutes of physical activity most days can help lower blood pressure, lower cholesterol, and keep your weight at a healthy level.  Drug therapy is used when cholesterol levels do not respond to therapeutic lifestyle changes (smoking cessation, diet, and exercise) and remains unacceptably high.  If medication is started, it is important to have you levels checked periodically to evaluate the need for further treatment options.  Thank you,    [facility] Team

## 2011-01-22 NOTE — Assessment & Plan Note (Signed)
 Summary: bp ck,tcb   Vital Signs:  Patient profile:   70 year old female Height:      62.75 inches Weight:      169 pounds BMI:     30.29 BSA:     1.80 Temp:     98.6 degrees F Pulse rate:   80 / minute BP sitting:   138 / 71  Vitals Entered By: Harlene Carte CMA (April 20, 2009 2:31 PM) CC: f/u BP Pain Assessment Patient in pain? no       Last HGBA1C:  7.2 (04/03/2009 8:15:04 AM) HGBA1C Next Due:  3 mo   History of Present Illness: 51F with DM2, HTN, anemia, and c/o lump in throat when swallowing.  DM2: Sugars range from 90s-170s.  No hypoglycemia.  Taking meds as prescribed.  Last A1c 7.2.    HTN:  Elevated today.  No HA, SOB, CP.  Lump in throat:  Present at last visit, see 04/03/2009 office note. Pt feels that it doesn't really bother her that much.  Wasn't even going to bring it up until I asked her.    Anemia:  Asymptomatic, w/u done in hospital.  Low B12 levels.  Habits & Providers     Tobacco Status: never  Allergies: 1)  Septra Ds (Sulfamethoxazole-Trimethoprim) 2)  Codeine  Past History:  Past Medical History:    Cath 11/01 Angioplasty PDA 90-40%, Former chewing tobacco use - quit 9/95, Hx thyroid  CA, s/p thyroid  surg, with hypothyroidi, LFTs 18/20 04/2005, NQWMI 11/01, Postmenopausal bleeding 3/99 with neg bx, TSH wnl 04/2005    GASTRITIS (ICD-535.50)    POSTMENOPAUSAL STATUS (ICD-V49.81)    CHEST WALL PAIN, ANTERIOR (ICD-786.52)    EUSTACHIAN TUBE DYSFUNCTION, LEFT (ICD-381.81)    UPPER RESPIRATORY INFECTION, VIRAL (ICD-465.9)    SCREENING FOR MALIGNANT NEOPLASM OF THE CERVIX (ICD-V76.2)    DYSURIA (ICD-788.1)    HYPOTHYROIDISM, UNSPECIFIED (ICD-244.9)    HYPERTENSION, BENIGN SYSTEMIC (ICD-401.1)    HYPERCHOLESTEROLEMIA (ICD-272.0)    GASTROESOPHAGEAL REFLUX, NO ESOPHAGITIS (ICD-530.81)    DIABETES MELLITUS II, UNCOMPLICATED (ICD-250.00)    CORONARY, ARTERIOSCLEROSIS (ICD-414.00)    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 Keratosis, no CA - 09/13/2004, TSH 0.225, free T4 1.33 - 05/24/2004    03/15/09 Cath by Dr. Obie, 80-90% stenosis of diagonal branch of LAD.  PTCA performed. 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 Lungs:  Normal respiratory effort, chest expands symmetrically. Lungs are clear to auscultation, no crackles or wheezes. Heart:  Normal rate and regular rhythm. S1 and S2 normal without gallop, murmur, click, rub or other extra sounds. Abdomen:  Bowel sounds positive,abdomen soft and non-tender without masses, organomegaly or hernias noted.   Impression & Recommendations:  Problem # 1:  DIABETES MELLITUS II, UNCOMPLICATED (ICD-250.00) Assessment Improved Increase Glipizide to 20mg  daily (1 tabs).  Will check A1c on 07/03/2009.  RTC 1 week to reassess  control.  Pt to check AC and HS sugars and let me know.  Her updated medication list for this problem includes:    Bayer Childrens Aspirin  81 Mg Chew (Aspirin ) .SABRA... Take 1 tablet by mouth once a day    Lantus  100 Unit/ml Soln (Insulin  glargine) .SABRA... 8 units in the morning    Glipizide Xl 10 Mg Tb24 (Glipizide) .SABRA... 2 tablet by mouth daily    Lisinopril  40 Mg Tabs (Lisinopril ) .SABRA... 1 tab by mouth once daily    Metformin  Hcl 1000 Mg Tabs (Metformin  hcl) .SABRA... 1 tab by mouth two times a day  Orders: [facility]- Est  Level 4 (00785)  Problem # 2:  HYPERTENSION, BENIGN SYSTEMIC (ICD-401.1) Assessment: Improved  Improved but not optimal.  Will increase HCTZ to 25 mg once daily.  Recheck BP at next visit.  Her updated medication list for this problem includes:    Norvasc  10 Mg Tabs (Amlodipine  besylate) ..... One tab by mouth daily    Coreg  25 Mg Tabs (Carvedilol ) ..... One tab by mouth bid    Lisinopril  40 Mg Tabs (Lisinopril ) .SABRA... 1 tab by mouth once daily    Hydrochlorothiazide  25 Mg Tabs (Hydrochlorothiazide ) ..... One tab by mouth daily  Orders: [facility]- Est  Level 4 (00785)  Problem # 3:  DYSPHAGIA UNSPECIFIED (ICD-787.20) Assessment: Unchanged Offered to do barium esophagogram to better elucidate cause of dysphagia but it is not bothering pt much now.  She declines at this time.  Will reassess at next visit.  Orders: [facility]- Est  Level 4 (00785)  Problem # 4:  ANEMIA, OTHER, UNSPECIFIED (ICD-285.9) Assessment: Unchanged Mildly anemic from recent hospitalization.  Low B12 levels.  Low-normal Ferritin, normal TIBC, UIBC, Iron.  Normal MCV.  Will give iron and B12 shot today.  Likely normocytic anemia with component of iron deficiency and B12 deficiency.  Recheck CBC in 2.5 months at patient's follow up in July.  Her updated medication list for this problem includes:    Ferrous Sulfate  325 (65 Fe) Mg Tabs (Ferrous sulfate ) ..... One tab by mouth bid  Orders: [facility]- Est  Level 4  (99214) Vit B12 1000 mcg (J3420)  Complete Medication List: 1)  Bayer Childrens Aspirin  81 Mg Chew (Aspirin ) .... Take 1 tablet by mouth once a day 2)  Imdur  60 Mg Tb24 (Isosorbide  mononitrate) .... One tab by mouth daily 3)  Lantus  100 Unit/ml Soln (Insulin  glargine) .... 8 units in the morning 4)  Norvasc  10 Mg Tabs (Amlodipine  besylate) .... One tab by mouth daily 5)  Synthroid  50 Mcg Tabs (Levothyroxine  sodium) .... One tab by mouth daily 6)  Zocor  80 Mg Tabs (Simvastatin ) .... One tab by mouth daily 7)  Coreg  25 Mg Tabs (Carvedilol ) .... One tab by mouth bid 8)  Aimsco Insulin  Syringe 31g X 5/16 0.3 Ml Misc (Insulin  syringe-needle u-100) .SABRA.. 100 use as directed with insulin  9)  Glipizide Xl 10 Mg Tb24 (  Glipizide) .... 2 tablet by mouth daily 10)  Flonase  50 Mcg/act Susp (Fluticasone  propionate) .... 2 sprays each nostril daily 11)  Lisinopril  40 Mg Tabs (Lisinopril ) .SABRA.. 1 tab by mouth once daily 12)  Metformin  Hcl 1000 Mg Tabs (Metformin  hcl) .SABRA.. 1 tab by mouth two times a day 13)  Plavix 75 Mg Tabs (Clopidogrel bisulfate) .SABRA.. 1 tab by mouth once daily 14)  Hydrochlorothiazide  25 Mg Tabs (Hydrochlorothiazide ) .... One tab by mouth daily 15)  Ferrous Sulfate  325 (65 Fe) Mg Tabs (Ferrous sulfate ) .... One tab by mouth bid  Patient Instructions: 1)  Great to see you. 2)  I will give you 1000mcg vitamin B12 today. 3)  Increase Glipizide to two of the 10mg  tabs daily. 4)  Take two of the 12.5mg  hydrochlorothiazide  tabs daily until you run out, then get your new script for 25mg  daily. 5)  Take Iron 325mg  by mouth two times a day, I have sent this to your pharmacy. 6)  Come back to see me in one week to recheck BP and sugars. Continue to write down your sugars.   7)  -Dr. ONEIDA. Prescriptions: FERROUS SULFATE  325 (65 FE) MG TABS (FERROUS SULFATE ) One tab by mouth BID  #180 x 6   Entered and Authorized by:   Debby Petties MD   Signed by:   Debby Petties MD on 04/20/2009    Method used:   Electronically to        [facility] (retail)       [address]       [place], [place]  [address]       Ph: [phone]       Fax: [phone]   RxID:   [id number] HYDROCHLOROTHIAZIDE  25 MG TABS (HYDROCHLOROTHIAZIDE ) One tab by mouth daily  #90 x 6   Entered and Authorized by:   Debby Petties MD   Signed by:   Debby Petties MD on 04/20/2009   Method used:   Electronically to        [facility] (retail)       [address]       [place], [place]  [address]       Ph: [phone]       Fax: [phone]   RxID:   [id number] GLIPIZIDE XL 10 MG TB24 (GLIPIZIDE) 2 tablet by mouth daily  #180 x 3   Entered and Authorized by:   Debby Petties MD   Signed by:   Debby Petties MD on 04/20/2009   Method used:   Electronically to        [facility] (retail)       [address]       [place], [place]  [address]       Ph: [phone]       Fax: [phone]   RxID:   [id number]    Medication Administration  Injection # 1:    Medication: Vit B12 1000 mcg    Diagnosis: ANEMIA, OTHER, UNSPECIFIED (ICD-285.9)    Route: IM    Site: R deltoid    Exp Date: 11/22/2010    Lot #: [id number]    Mfr: American Regent    Patient tolerated injection without complications    Given by: Harlene Carte CMA (April 20, 2009 2:53 PM)  Orders Added: 1)  [facility]- Est  Level 4 [99214] 2)  Vit B12 1000 mcg [J3420]

## 2011-01-22 NOTE — Assessment & Plan Note (Signed)
Summary: B12 inj,tcb   Nurse Visit   Allergies: 1)  Septra Ds (Sulfamethoxazole-Trimethoprim) 2)  Codeine  Medication Administration  Injection # 1:    Medication: Vit B12 1000 mcg    Diagnosis: VITAMIN B12 DEFICIENCY (ICD-266.2)    Route: IM    Site: L deltoid    Exp Date: 11/2011    Lot #: [id number]    Mfr: American Regent    Patient tolerated injection without complications    Given by: Theresia Lo RN (January 05, 2010 1:52 PM)  Orders Added: 1)  Vit B12 1000 mcg [J3420] 2)  Admin of Injection (IM/SQ) [81191]   Medication Administration  Injection # 1:    Medication: Vit B12 1000 mcg    Diagnosis: VITAMIN B12 DEFICIENCY (ICD-266.2)    Route: IM    Site: L deltoid    Exp Date: 11/2011    Lot #: [id number]    Mfr: American Regent    Patient tolerated injection without complications    Given by: Theresia Lo RN (January 05, 2010 1:52 PM)  Orders Added: 1)  Vit B12 1000 mcg [J3420] 2)  Admin of Injection (IM/SQ) [[id number]]

## 2011-01-22 NOTE — Miscellaneous (Signed)
  Medications Added * VITAMIN B12 (CYANOCOBALAMIN ) 1000MCG Intramuscular injection qdaily x 1week, then qweekly x1 month, then qmonthly x 6 months.       Clinical Lists Changes  Medications: Added new medication of * VITAMIN B12 (CYANOCOBALAMIN ) 1000MCG Intramuscular injection qdaily x 1week, then qweekly x1 month, then qmonthly x 6 months.  Appended Document: Orders Update     Clinical Lists Changes  Orders: Added new Service order of Vit B12 1000 mcg (J3420) - Signed      Appended Document:  Letter mailed to home address regarding a nurse visit for B12 injections. No answering machine.

## 2011-01-22 NOTE — Assessment & Plan Note (Signed)
Summary: 6 MO F/U  Medications Added FLONASE 50 MCG/ACT  SUSP (FLUTICASONE PROPIONATE) 2 sprays each nostril daily as needed PLAVIX 75 MG TABS (CLOPIDOGREL BISULFATE) RAN OUT 1 tab by mouth once daily FERROUS SULFATE 325 (65 FE) MG  TABS (FERROUS SULFATE) 1 tab by mouth once daily NITROSTAT 0.4 MG SUBL (NITROGLYCERIN) 1 tablet under tongue at onset of chest pain; you may repeat every 5 minutes for up to 3 doses.      Allergies Added:   Primary Provider:  Rodney Langton MD   History of Present Illness: [name] is seen today in followup for her hypertension hypercholesterolemia diabetes and coronary artery disease.  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  She had recurrent angina in March 2010 and had a cutting balloon therapy of her diagonal branch by Dr. [name].  He is not had any recurrent pain.  Been compliant with her medications.  She does not follow her sugars closely she should.  Plan to her the connection between tight blood sugar control and improved patency of her stent.  Hemoglobin A1c on her.  She is on statin therapy.  Her lites are helps been fine.  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  Does carry nitroglycerin with her. She needs blood work but would like to do it on a day that she is at [facility] for her B12 shot  She needs a refill on her nitro and a handicap placard so she doesnt have to walk so far  Current Problems (verified): 1)  Encounter For Long-term Use of Other Medications  (ICD-V58.69) 2)  Eustachian Tube Dysfunction, Left  (ICD-381.81) 3)  Vitamin B12 Deficiency  (ICD-266.2) 4)  Dysphagia Unspecified  (ICD-787.20) 5)  Gastritis  (ICD-535.50) 6)  Postmenopausal Status  (ICD-V49.81) 7)  Chest Wall Pain, Anterior  (ICD-786.52) 8)  Eustachian Tube Dysfunction, Left  (ICD-381.81) 9)  Upper Respiratory Infection, Viral  (ICD-465.9) 10)  Screening For Malignant Neoplasm of The Cervix  (ICD-V76.2) 11)   Dysuria  (ICD-788.1) 12)  Hypothyroidism, Unspecified  (ICD-244.9) 13)  Hypertension, Benign Systemic  (ICD-401.1) 14)  Hypercholesterolemia  (ICD-272.0) 15)  Gastroesophageal Reflux, No Esophagitis  (ICD-530.81) 16)  Diabetes Mellitus II, Uncomplicated  (ICD-250.00) 17)  Coronary, Arteriosclerosis  (ICD-414.00) 18)  Cor Pulmonale  (ICD-416.0) 19)  Carpal Tunnel Syndrome  (ICD-354.0) 20)  Anemia, Other, Unspecified  (ICD-285.9)  Current Medications (verified): 1)  Bayer Childrens Aspirin 81 Mg Chew (Aspirin) .... Take 1 Tablet By Mouth Once A Day 2)  Imdur 60 Mg Tb24 (Isosorbide Mononitrate) .... One Tab By Mouth Daily 3)  Lantus 100 Unit/ml Soln (Insulin Glargine) .[name].. 10 Units in The Morning 4)  Norvasc 10 Mg Tabs (Amlodipine Besylate) .... One Tab By Mouth Daily 5)  Synthroid 50 Mcg Tabs (Levothyroxine Sodium) .... One Tab By Mouth Daily 6)  Zocor 80 Mg Tabs (Simvastatin) .... One Tab By Mouth Daily 7)  Coreg 25 Mg Tabs (Carvedilol) .... One Tab By Mouth Bid 8)  Aimsco Insulin Syringe 31g X 5/16" 0.3 Ml  Misc (Insulin Syringe-Needle U-100) .[name].. 100 Use As Directed With Insulin 9)  Glipizide Xl 10 Mg Tb24 (Glipizide) .... 2 Tablet By Mouth Daily 10)  Flonase 50 Mcg/act  Susp (Fluticasone Propionate) .... 2 Sprays Each Nostril Daily As Needed 11)  Lisinopril 40 Mg Tabs (Lisinopril) .[name].. 1 Tab By Mouth Once Daily 12)  Metformin Hcl 1000 Mg Tabs (Metformin Hcl) .[name].. 1 Tab By  Mouth Two Times A Day 13)  Plavix 75 Mg Tabs (Clopidogrel Bisulfate) .... Ran Out 1 Tab By Mouth Once Daily 14)  Hydrochlorothiazide 25 Mg Tabs (Hydrochlorothiazide) .... One Tab By Mouth Daily 15)  Ferrous Sulfate 325 (65 Fe) Mg Tabs (Ferrous Sulfate) .... One Tab By Mouth Bid 16)  Ferrous Sulfate 325 (65 Fe) Mg  Tabs (Ferrous Sulfate) .[name].. 1 Tab By Mouth Once Daily 17)  Claritin-D 24 Hour 10-240 Mg Xr24h-Tab (Loratadine-Pseudoephedrine) .... One Tab By Mouth Daily. Use 5 Days Then Stop. 18)  Simply Saline 0.9 % Aers  (Saline) .... Spray 2 Sprays in Each Nostril 4 Times A Day As Needed. 19)  Nitrostat 0.4 Mg Subl (Nitroglycerin) .[name].. 1 Tablet Under Tongue At Onset of Chest Pain; You May Repeat Every 5 Minutes For Up To 3 Doses.  Allergies (verified): 1)  Septra Ds (Sulfamethoxazole-Trimethoprim) 2)  Codeine  Past History:  Past Medical History: Last updated: 03/29/2009 Cath 11/01 Angioplasty PDA 90-40%, Former chewing tobacco use - quit 9/95, Hx thyroid CA, s/p thyroid surg, with hypothyroidi, LFTs 18/20 2005-05-28, NQWMI 11/01, Postmenopausal bleeding 3/99 with neg bx, TSH wnl 05/28/05 GASTRITIS (ICD-535.50) POSTMENOPAUSAL STATUS (ICD-V49.81) CHEST WALL PAIN, ANTERIOR (ICD-786.52) EUSTACHIAN TUBE DYSFUNCTION, LEFT (ICD-381.81) UPPER RESPIRATORY INFECTION, VIRAL (ICD-465.9) SCREENING FOR MALIGNANT NEOPLASM OF THE CERVIX (ICD-V76.2) DYSURIA (ICD-788.1) HYPOTHYROIDISM, UNSPECIFIED (ICD-244.9) HYPERTENSION, BENIGN SYSTEMIC (ICD-401.1) HYPERCHOLESTEROLEMIA (ICD-272.0) GASTROESOPHAGEAL REFLUX, NO ESOPHAGITIS (ICD-530.81) DIABETES MELLITUS II, UNCOMPLICATED (ICD-250.00) CORONARY, ARTERIOSCLEROSIS (ICD-414.00) 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 Keratosis, no CA - 09/13/2004, TSH 0.225, free T4 1.33 - 05/24/2004  03/15/09 Cath by Dr. Juanda Chance, 80-90% stenosis of diagonal branch of LAD.  PTCA performed.  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   Vital Signs:  Patient profile:   70 year old female Height:      68 inches Weight:      168 pounds BMI:     25.64 Pulse rate:   71 / minute Resp:     14 per minute BP sitting:   120 / 80  (left arm)  Vitals Entered By: Kem Parkinson (June 28, 2010 2:20 PM)  Physical Exam  General:  Affect appropriate Healthy:  appears stated age HEENT: normal Neck supple with no adenopathy JVP normal no bruits no thyromegaly Lungs clear with no wheezing and good diaphragmatic motion Heart:  S1/S2 no murmur,rub, gallop or click PMI normal Abdomen: benighn, BS positve, no tenderness, no AAA no bruit.  No  HSM or HJR Distal pulses intact with no bruits No edema Neuro non-focal Skin warm and dry    Impression & Recommendations:  Problem # 1:  HYPERTENSION, BENIGN SYSTEMIC (ICD-401.1) Well controlled Her updated medication list for this problem includes:    Bayer Childrens Aspirin 81 Mg Chew (Aspirin) .[name]... Take 1 tablet by mouth once a day    Norvasc 10 Mg Tabs (Amlodipine besylate) ..... One tab by mouth daily    Coreg 25 Mg Tabs (Carvedilol) ..... One tab by mouth bid    Lisinopril 40 Mg Tabs (Lisinopril) .[name]... 1 tab by mouth once daily    Hydrochlorothiazide 25 Mg Tabs (Hydrochlorothiazide) ..... One tab by mouth daily  Problem # 2:  HYPERCHOLESTEROLEMIA (ICD-272.0) At goal with no side effects Her  updated medication list for this problem includes:    Zocor 80 Mg Tabs (Simvastatin) ..... One tab by mouth daily  CHOL: 137 (01/05/2010)   LDL: 72 (01/05/2010)   HDL: 47 (01/05/2010)   TG: 91 (01/05/2010)  Problem # 3:  CORONARY, ARTERIOSCLEROSIS (ICD-414.00) Stable no anina.  Continiue ASA and Plavix Her updated medication list for this problem includes:    Bayer Childrens Aspirin 81 Mg Chew (Aspirin) .[name]... Take 1 tablet by mouth once a day    Imdur 60 Mg Tb24 (Isosorbide mononitrate) ..... One tab by mouth daily    Norvasc 10 Mg Tabs (Amlodipine besylate) ..... One tab by mouth daily    Coreg 25 Mg Tabs (Carvedilol) ..... One tab by mouth bid    Lisinopril 40 Mg Tabs (Lisinopril) .[name]... 1 tab by mouth once daily    Plavix 75 Mg Tabs (Clopidogrel bisulfate) .[name]... Ran out 1 tab by mouth once daily    Nitrostat 0.4 Mg Subl (Nitroglycerin) .[name]... 1 tablet under tongue at onset of chest pain; you may repeat every 5 minutes for up to 3 doses. Prescriptions: NITROSTAT 0.4 MG SUBL (NITROGLYCERIN) 1 tablet under tongue at onset of chest pain; you may repeat every 5 minutes for up to 3 doses.  #25 x 12   Entered by:   Kem Parkinson   Authorized by:   Colon Branch, MD, [facility]   Signed by:   Kem Parkinson on 06/28/2010   Method used:   Electronically to        [facility] (retail)       [address]       [place], [place]  [address]       Ph: [phone]       Fax: [phone]   RxID:   [id number]    EKG Report  Procedure date:  06/28/2010  Findings:      NSR 71 Inferolateral T wave inversions old No acute changes

## 2011-01-22 NOTE — Letter (Signed)
Summary: Handout Printed  Printed Handout:  - *Patient Instructions 

## 2011-01-22 NOTE — Assessment & Plan Note (Signed)
Summary: f/u,df   Vital Signs:  Patient profile:   70 year old female Weight:      166.7 pounds Temp:     98.5 degrees F oral Pulse rate:   87 / minute Pulse rhythm:   regular BP sitting:   148 / 74  (left arm) Cuff size:   regular  Vitals Entered By: Loralee Pacas CMA (March 28, 2010 10:38 AM)  Primary Care Provider:  Rodney Langton MD   History of Present Illness: Due for some preventive medicine.  DM2:  A1c elevated last visit.  No symptoms hypoglycemia, doesn't check sugars reglarly.  taking lantus no pros.  HTN:  BP slightly elevated, no symptoms.  Anemia: Finished b12 shots.  No fatigue.    Current Medications (verified): 1)  Bayer Childrens Aspirin 81 Mg Chew (Aspirin) .... Take 1 Tablet By Mouth Once A Day 2)  Imdur 60 Mg Tb24 (Isosorbide Mononitrate) .... One Tab By Mouth Daily 3)  Lantus 100 Unit/ml Soln (Insulin Glargine) .[name].. 10 Units in The Morning 4)  Norvasc 10 Mg Tabs (Amlodipine Besylate) .... One Tab By Mouth Daily 5)  Synthroid 50 Mcg Tabs (Levothyroxine Sodium) .... One Tab By Mouth Daily 6)  Zocor 80 Mg Tabs (Simvastatin) .... One Tab By Mouth Daily 7)  Coreg 25 Mg Tabs (Carvedilol) .... One Tab By Mouth Bid 8)  Aimsco Insulin Syringe 31g X 5/16" 0.3 Ml  Misc (Insulin Syringe-Needle U-100) .[name].. 100 Use As Directed With Insulin 9)  Glipizide Xl 10 Mg Tb24 (Glipizide) .... 2 Tablet By Mouth Daily 10)  Flonase 50 Mcg/act  Susp (Fluticasone Propionate) .... 2 Sprays Each Nostril Daily 11)  Lisinopril 40 Mg Tabs (Lisinopril) .[name].. 1 Tab By Mouth Once Daily 12)  Metformin Hcl 1000 Mg Tabs (Metformin Hcl) .[name].. 1 Tab By Mouth Two Times A Day 13)  Plavix 75 Mg Tabs (Clopidogrel Bisulfate) .[name].. 1 Tab By Mouth Once Daily 14)  Hydrochlorothiazide 25 Mg Tabs (Hydrochlorothiazide) .... One Tab By Mouth Daily 15)  Ferrous Sulfate 325 (65 Fe) Mg Tabs (Ferrous Sulfate) .... One Tab By Mouth Bid 16)  Vitamin B12 (Cyanocobalamin) .... Intramuscular Injection  Qdaily X 1week, Then [employer], Then [employer] X 6 Months. 17)  Claritin-D 24 Hour 10-240 Mg Xr24h-Tab (Loratadine-Pseudoephedrine) .... One Tab By Mouth Daily. Use 5 Days Then Stop. 18)  Simply Saline 0.9 % Aers (Saline) .... Spray 2 Sprays in Each Nostril 4 Times A Day As Needed.  Allergies (verified): 1)  Septra Ds (Sulfamethoxazole-Trimethoprim) 2)  Codeine  Review of Systems       See HPI  Physical Exam  General:  Well-developed,well-nourished,in no acute distress; alert,appropriate and cooperative throughout examination Neck:  No deformities, masses, or tenderness noted. Lungs:  Normal respiratory effort, chest expands symmetrically. Lungs are clear to auscultation, no crackles or wheezes. Heart:  Normal rate and regular rhythm. S1 and S2 normal without gallop, murmur, click, rub or other extra sounds. Abdomen:  Bowel sounds positive,abdomen soft and non-tender without masses, organomegaly or hernias noted. Extremities:  No clubbing, cyanosis, edema, or deformity noted with normal full range of motion of all joints.   Skin:  Intact without suspicious lesions or rashes  Diabetes Management Exam:    Foot Exam (with socks and/or shoes not present):       Sensory-Monofilament:          Left foot: normal          Right foot: normal   Impression & Recommendations:  Problem # 1:  HYPERTENSION, BENIGN SYSTEMIC (ICD-401.1) Assessment Deteriorated Elevated, no changes for now as BP has been normal in the past.  Her updated medication list for this problem includes:    Norvasc 10 Mg Tabs (Amlodipine besylate) ..... One tab by mouth daily    Coreg 25 Mg Tabs (Carvedilol) ..... One tab by mouth bid    Lisinopril 40 Mg Tabs (Lisinopril) .[name]... 1 tab by mouth once daily    Hydrochlorothiazide 25 Mg Tabs (Hydrochlorothiazide) ..... One tab by mouth daily  Orders: [facility]- Est  Level 4 (16109)  Problem # 2:  DIABETES MELLITUS II, UNCOMPLICATED (ICD-250.00) Assessment:  Unchanged Last a1c elevated, pt to increase lantus for cbgs >120.  due for eye exam, pt to make appt.  Foot exam normal, handouts/guidance given.  Microalbmin.  Her updated medication list for this problem includes:    Bayer Childrens Aspirin 81 Mg Chew (Aspirin) .[name]... Take 1 tablet by mouth once a day    Lantus 100 Unit/ml Soln (Insulin glargine) .Marland KitchenMarland KitchenMarland KitchenMarland Kitchen 10 units in the morning    Glipizide Xl 10 Mg Tb24 (Glipizide) .[name]... 2 tablet by mouth daily    Lisinopril 40 Mg Tabs (Lisinopril) .[name]... 1 tab by mouth once daily    Metformin Hcl 1000 Mg Tabs (Metformin hcl) .[name]... 1 tab by mouth two times a day  Orders: A1C-[facility] (60454) UA Microalbumin-[facility] (82044) [facility]- Est  Level 4 (99214)Future Orders: A1C-[facility] (09811) ... 03/29/2011  Problem # 3:  ANEMIA, OTHER, UNSPECIFIED (ICD-285.9) Assessment: Unchanged Finished b12 shots, still taking iron, Checking CBC today.  Her updated medication list for this problem includes:    Ferrous Sulfate 325 (65 Fe) Mg Tabs (Ferrous sulfate) ..... One tab by mouth bid  Orders: CBC-[facility] (91478) [facility]- Est  Level 4 (29562)  Complete Medication List: 1)  Bayer Childrens Aspirin 81 Mg Chew (Aspirin) .... Take 1 tablet by mouth once a day 2)  Imdur 60 Mg Tb24 (Isosorbide mononitrate) .... One tab by mouth daily 3)  Lantus 100 Unit/ml Soln (Insulin glargine) .[name].. 10 units in the morning 4)  Norvasc 10 Mg Tabs (Amlodipine besylate) .... One tab by mouth daily 5)  Synthroid 50 Mcg Tabs (Levothyroxine sodium) .... One tab by mouth daily 6)  Zocor 80 Mg Tabs (Simvastatin) .... One tab by mouth daily 7)  Coreg 25 Mg Tabs (Carvedilol) .... One tab by mouth bid 8)  Aimsco Insulin Syringe 31g X 5/16" 0.3 Ml Misc (Insulin syringe-needle u-100) .[name].. 100 use as directed with insulin 9)  Glipizide Xl 10 Mg Tb24 (Glipizide) .... 2 tablet by mouth daily 10)  Flonase 50 Mcg/act Susp (Fluticasone propionate) .... 2 sprays each nostril daily 11)  Lisinopril 40 Mg Tabs (Lisinopril) .[name]..  1 tab by mouth once daily 12)  Metformin Hcl 1000 Mg Tabs (Metformin hcl) .[name].. 1 tab by mouth two times a day 13)  Plavix 75 Mg Tabs (Clopidogrel bisulfate) .[name].. 1 tab by mouth once daily 14)  Hydrochlorothiazide 25 Mg Tabs (Hydrochlorothiazide) .... One tab by mouth daily 15)  Ferrous Sulfate 325 (65 Fe) Mg Tabs (Ferrous sulfate) .... One tab by mouth bid 16)  Vitamin B12 (cyanocobalamin)  .... Intramuscular injection qdaily x 1week, then qweekly x1 month, then qmonthly x 6 months. 17)  Claritin-d 24 Hour 10-240 Mg Xr24h-tab (Loratadine-pseudoephedrine) .... One tab by mouth daily. use 5 days then stop. 18)  Simply Saline 0.9 % Aers (Saline) .... Spray 2 sprays in each nostril 4 times a day as needed.  Patient Instructions: 1)  Great to see you today, 2)  Checking urine and CBC. 3)  Increase lantus by 2 units a day only if your morning sugar is above 120, of 120 or less then do not change dose. 4)  BE SURE TO CALL DR Nile Riggs FOR YOUR DIABETIC EYE EXAM.  Have them send their report to me. 5)  Come back in 3 months to check another a1c. 6)  -Dr. Karie Schwalbe.  Last HDL:  47 (01/05/2010 11:51:00 PM) HDL Next Due:  1 yr Last LDL:  72 (01/05/2010 11:51:00 PM) LDL Next Due:  1 yr Last Mammogram:  ASSESSMENT: Negative - BI-RADS 1^MM DIGITAL SCREENING (10/12/2009 11:41:00 AM) Mammogram Next Due:  1 yr Last Diabetes Foot Check:  yes (10/11/2008 1:23:11 PM) Diabetic Foot Exam Date:  03/28/2010 Diabetes Foot Check Result:  normal Diabetes Foot Check Next Due:  1 yr Last HGBA1C:  8.2 (01/05/2010 9:35:04 AM) HGBA1C Next Due:  3 mo Last Foot Care Education:  completed (10/11/2008 1:23:11 PM) Foot Care Education Date:  03/28/2010 Foot Care Education:  completed Foot Care Education Due:  1 yr Last Self-Mgt EDU:  completed (10/11/2008 1:23:11 PM) Self-Mgt EDU Date:  03/28/2010 Self-Mgt EDU:  completed Self-Mgt EDU Next Due:  1 yr Last Creatinine:  0.73 (01/05/2010 11:51:00 PM) Creatinine Next Due:  1 yr Last Potassium:  4.3 (01/05/2010 11:51:00 PM) Potassium Next Due:  1 yr    Diabetic Foot Exam Foot Inspection Is there a history of a foot ulcer?              No Is there a foot ulcer now?              No Is there swelling or an abnormal foot shape?          No Are the toenails long?                No Are the toenails thick?                No Are the toenails ingrown?              No Is there heavy callous build-up?              No Is there pain in the calf muscle (Intermittent claudication) when walking?    NoIs there a claw toe deformity?              No Is there elevated skin temperature?            No Is there limited ankle dorsiflexion?            No Is there foot or ankle muscle weakness?            No  Diabetic Foot Care Education Pulse Check          Right Foot          Left Foot Posterior Tibial:        normal            normal Dorsalis Pedis:        normal            normal  High Risk Feet? No Set Next Diabetic Foot Exam here: 03/29/2011   10-g (5.07) Semmes-Weinstein Monofilament Test Performed by: Rodney Langton MD          Right Foot          Left Foot Visual Inspection  Test Control      normal         normal Site 1         normal         normal Site 2         normal         normal Site 3         normal         normal Site 4         normal         normal Site 5         normal         normal Site 6         normal         normal Site 7         normal         normal Site 8         normal         normal Site 9         normal         normal Site 10         normal         normal  Impression      normal         normal                Nursing Instructions: Diabetic foot exam today    Laboratory Results   Urine Tests  Date/Time Received: March 28, 2010 11:18 AM  Date/Time Reported: March 28, 2010 11:25 AM   Microalbumin (urine): 30 mg/L Creatinine: 100mg /dL  A:C Ratio <04 Normal Comments: ...........test  performed by...........Marland KitchenTerese Door, CMA

## 2011-01-22 NOTE — Assessment & Plan Note (Signed)
 Summary: f/u eo   Vital Signs:  Patient profile:   70 year old female Height:      62.75 inches Weight:      169.2 pounds O2 Sat:      79 % Temp:     98.4 degrees F oral BP sitting:   126 / 76  (left arm)  Vitals Entered By: Letitia Reusing (Apr 25, 2009 2:11 PM)  CC: f/u Is Patient Diabetic? Yes   History of Present Illness: 70F with HTN, DM2, Anemia, and MMP presents for follow up of the aforementioned issues.  HTN:  We increase HCTZ to 25mg  once daily last visit.  BP now well controlled.  No complaints, no dizziness, lightheadedness, SOB, CP, leg swelling.   DM2:  Pt was able to bring her a1c down to 7.2 by last visit.  Has been trying to eat healthy.  Taking metformin , glipizide, and lantus  8 units.  No hypoglycemic symptoms.   No polyuria/polydipsia/polyphagia.  Checks feet daily for sores.  Anemia:  Had low B12 and low-normal ferritin in hospital.  Repleted B12 at last visit and started her on FeSO4.  Pt states she feels MUCH better, lots more energy.  No abd pain or melena/hematochezia/constipation.  Habits & Providers     Tobacco Status: never  Allergies: 1)  Septra Ds (Sulfamethoxazole-Trimethoprim) 2)  Codeine  Past History:  Past Medical History:    Cath 11/01 Angioplasty PDA 90-40%, Former chewing tobacco use - quit 9/95, Hx thyroid  CA, s/p thyroid  surg, with hypothyroidi, LFTs 18/20 04/2005, NQWMI 11/01, Postmenopausal bleeding 3/99 with neg bx, TSH wnl 04/2005    GASTRITIS (ICD-535.50)    POSTMENOPAUSAL STATUS (ICD-V49.81)    CHEST WALL PAIN, ANTERIOR (ICD-786.52)    EUSTACHIAN TUBE DYSFUNCTION, LEFT (ICD-381.81)    UPPER RESPIRATORY INFECTION, VIRAL (ICD-465.9)    SCREENING FOR MALIGNANT NEOPLASM OF THE CERVIX (ICD-V76.2)    DYSURIA (ICD-788.1)    HYPOTHYROIDISM, UNSPECIFIED (ICD-244.9)    HYPERTENSION, BENIGN SYSTEMIC (ICD-401.1)    HYPERCHOLESTEROLEMIA (ICD-272.0)    GASTROESOPHAGEAL REFLUX, NO ESOPHAGITIS (ICD-530.81)    DIABETES MELLITUS II,  UNCOMPLICATED (ICD-250.00)    CORONARY, ARTERIOSCLEROSIS (ICD-414.00)    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 Keratosis, no CA - 09/13/2004, TSH 0.225, free T4 1.33 - 05/24/2004    03/15/09 Cath by Dr. Obie, 80-90% stenosis of diagonal branch of LAD.  PTCA performed. 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 Lungs are clear to auscultation, no crackles or wheezes. Heart:  Normal rate and regular rhythm. S1 and S2 normal without gallop, murmur, click, rub or other extra sounds. Extremities:  No clubbing, cyanosis, edema, or deformity noted with normal full range of motion of all joints.     Impression & Recommendations:  Problem # 1:  HYPERTENSION, BENIGN SYSTEMIC (ICD-401.1) Assessment Improved Now well controlled.  No changes in meds.  Her updated medication list  for this problem includes:    Norvasc  10 Mg Tabs (Amlodipine  besylate) ..... One tab by mouth daily    Coreg  25 Mg Tabs (Carvedilol ) ..... One tab by mouth bid    Lisinopril  40 Mg Tabs (Lisinopril ) .SABRA... 1 tab by mouth once daily    Hydrochlorothiazide  25 Mg Tabs (Hydrochlorothiazide ) ..... One tab by mouth daily  Orders: [facility]- Est  Level 4 (00785)  Problem # 2:  DIABETES MELLITUS II, UNCOMPLICATED (ICD-250.00) Assessment: Comment Only We continue to try to get this under control.  Will recheck A1c in July to see if improved.  If not better, can increase metformin  to 1500mg  qAM and 1000mg  qPM.  Her updated medication list for this problem includes:    Bayer Childrens Aspirin  81 Mg Chew (Aspirin ) .SABRA... Take 1 tablet by mouth once a day    Lantus  100 Unit/ml Soln (Insulin  glargine) .SABRA... 8 units in the morning    Glipizide Xl 10 Mg Tb24 (Glipizide) .SABRA... 2 tablet by mouth daily    Lisinopril  40 Mg Tabs (Lisinopril ) .SABRA... 1 tab by mouth once daily    Metformin  Hcl 1000 Mg Tabs (Metformin  hcl) .SABRA... 1 tab by mouth two times a day  Orders: [facility]- Est  Level 4 (99214)Future Orders: A1C-[facility] (16963) ... 05/01/2010  Problem # 3:  ANEMIA, OTHER, UNSPECIFIED (ICD-285.9) Assessment: Improved Symptomatically improved.  No adverse effects from taking her iron as documented in HPI.  Plan to recheck CBC in July when I check her A1c.  Her updated medication list for this problem includes:    Ferrous Sulfate  325 (65 Fe) Mg Tabs (Ferrous sulfate ) ..... One tab by mouth bid  Orders: [facility]- Est  Level 4 (99214)Future Orders: CBC-[facility] (14972) ... 05/01/2010  Complete Medication List: 1)  Bayer Childrens Aspirin  81 Mg Chew (Aspirin ) .... Take 1 tablet by mouth once a day 2)  Imdur  60 Mg Tb24 (Isosorbide  mononitrate) .... One tab by mouth daily 3)  Lantus  100 Unit/ml Soln (Insulin  glargine) .... 8 units in the morning 4)  Norvasc  10 Mg Tabs (Amlodipine  besylate) .... One tab by mouth daily 5)  Synthroid  50  Mcg Tabs (Levothyroxine  sodium) .... One tab by mouth daily 6)  Zocor  80 Mg Tabs (Simvastatin ) .... One tab by mouth daily 7)  Coreg  25 Mg Tabs (Carvedilol ) .... One tab by mouth bid 8)  Aimsco Insulin  Syringe 31g X 5/16 0.3 Ml Misc (Insulin  syringe-needle u-100) .SABRA.. 100 use as directed with insulin  9)  Glipizide Xl 10 Mg Tb24 (Glipizide) .... 2 tablet by mouth daily 10)  Flonase  50 Mcg/act Susp (Fluticasone  propionate) .... 2 sprays each nostril daily 11)  Lisinopril  40 Mg Tabs (Lisinopril ) .SABRA.. 1 tab by mouth once daily 12)  Metformin  Hcl 1000 Mg Tabs (Metformin  hcl) .SABRA.. 1 tab by mouth two times a day 13)  Plavix 75 Mg Tabs (Clopidogrel bisulfate) .SABRA.. 1 tab by mouth once daily 14)  Hydrochlorothiazide  25 Mg Tabs (Hydrochlorothiazide ) .... One tab by mouth daily 15)  Ferrous Sulfate   325 (65 Fe) Mg Tabs (Ferrous sulfate ) .... One tab by mouth bid  Patient Instructions: 1)  WOW! 2)  Your blood pressure is excellent today.  No further changes in your water pills. 3)  Now we just need to fix your diabetes.  You have already gotten your hemoglobin A1c down to 7.2%. I need it below 7.0%.  Preferably below 6.5% if possible.  I will recheck your A1c on July 03, 2009.  I will also recheck your CBC to see if the B12 shot and iron has helped your anemia on July 12.  Please come back on July 12 for a lab visit.  You can come anytime in the day.   4)  Have the nurses at the front make you an appointment to see me right after your lab visit so I can address your diabetes control and anemia. 5)  -Dr. ONEIDA.  Appended Document: vital signs update     Clinical Lists Changes  Observations: Added new observation of PULSE RATE: 79 /min (04/25/2009 14:53)       Vital Signs:  Patient profile:   70 year old female Pulse rate:   79 / minute  Vitals Entered By: Letitia Reusing (Apr 25, 2009 2:53 PM) Pt's pulse was 79. 02 sat was not measured.Susie Reusing  Apr 25, 2009 2:54 PM

## 2011-01-22 NOTE — Letter (Signed)
Summary: Handout Printed  Printed Handout:  - Eye - Pink Eye (Viral Conjunctivitis)

## 2011-01-22 NOTE — Assessment & Plan Note (Signed)
 Summary: b12 shot/kh   Nurse Visit   Allergies: 1)  Septra Ds (Sulfamethoxazole-Trimethoprim) 2)  Codeine  Medication Administration  Injection # 1:    Medication: Vit B12 1000 mcg    Diagnosis: VITAMIN B12 DEFICIENCY (ICD-266.2)    Route: IM    Site: R deltoid    Exp Date: 04/2011    Lot #: [id number]    Mfr: [employer] Regent    Patient tolerated injection without complications    Given by: Avelina Sharps RN (October 02, 2009 11:41 AM)  Orders Added: 1)  Vit B12 1000 mcg [J3420] 2)  Admin of Injection (IM/SQ) [03627]   Medication Administration  Injection # 1:    Medication: Vit B12 1000 mcg    Diagnosis: VITAMIN B12 DEFICIENCY (ICD-266.2)    Route: IM    Site: R deltoid    Exp Date: 04/2011    Lot #: [id number]    Mfr: [employer] Regent    Patient tolerated injection without complications    Given by: Avelina Sharps RN (October 02, 2009 11:41 AM)  Orders Added: 1)  Vit B12 1000 mcg [J3420] 2)  Admin of Injection (IM/SQ) [03627]  patient has not received injections as originally ordered by  Dr. Curtis on 07/18/2009. spoke with Dr. Curtis and he advises to give Vitamin B12 100 micrograms  injection monthly for 6 months. patient aware.  Avelina Sharps RN  October 02, 2009 11:43 AM

## 2011-01-22 NOTE — Progress Notes (Signed)
 Summary: Triage  Phone Note Call from Patient Call back at Home Phone [phone]   Reason for Call: Talk to Nurse Summary of Call: pt is requesting to speak with RN, sts she saw something on the news re: her taking lantus , sts it may have something that causes cancer, pt does not want to take it. Initial call taken by: ROCKY POLI,  June 20, 2008 8:54 AM  Follow-up for Phone Call        saw a newspiece on tv saturday that claimed insulin  causes cancer. told her I was not aware of this info but would check it out & get back to her. advised her to KEEP taking it until she hears back from md. also very congested with a bad cold. states the cough syrup that was rxd last week makes her throw up so she cannot take it. waould like something sles. message to provider who saw her then Follow-up by: GINNIE MAU RN,  June 20, 2008 9:13 AM  Additional Follow-up for Phone Call Additional follow up Details #1::        May hold low dose Lantus  until she is reasured by primary MD that it is safe.  Stop using hydromet cough syrup and use OTC DM instead. Additional Follow-up by: DEVERE HARRIES NP,  June 20, 2008 9:53 AM

## 2011-01-22 NOTE — Progress Notes (Signed)
 Summary: Rx Req   Phone Note Refill Request Call back at Home Phone [phone] Message from:  Patient  Refills Requested: Medication #1:  LANTUS  100 UNIT/ML SOLN 10 units in the morning  Medication #2:  AIMSCO INSULIN  SYRINGE 31G X 5/16 0.3 ML  MISC 100 Use as directed with insulin  PT USES [facility]  Initial call taken by: Madelin Daring,  December 06, 2009 2:14 PM  Follow-up for Phone Call        will forward to MD. Follow-up by: Avelina Sharps RN,  December 06, 2009 2:19 PM  Additional Follow-up for Phone Call Additional follow up Details #1::        Refilled. Additional Follow-up by: Debby Petties MD,  December 06, 2009 2:56 PM    Prescriptions: AIMSCO INSULIN  SYRINGE 31G X 5/16 0.3 ML  MISC (INSULIN  SYRINGE-NEEDLE U-100) 100 Use as directed with insulin   #100 x 11   Entered and Authorized by:   Debby Petties MD   Signed by:   Debby Petties MD on 12/06/2009   Method used:   Electronically to        [facility] (retail)       [address]       [place], [place]  [address]       Ph: [phone]       Fax: [phone]   RxID:   [id number] LANTUS  100 UNIT/ML SOLN (INSULIN  GLARGINE) 10 units in the morning  #1 box x 6   Entered and Authorized by:   Debby Petties MD   Signed by:   Debby Petties MD on 12/06/2009   Method used:   Electronically to        [facility] (retail)       [address]       [place], [place]  [address]       Ph: [phone]       Fax: [phone]   RxID:   [id number]

## 2011-01-23 ENCOUNTER — Ambulatory Visit: Admit: 2011-01-23 | Payer: Self-pay

## 2011-01-23 ENCOUNTER — Encounter: Payer: Medicare Other | Admitting: Sports Medicine

## 2011-01-24 NOTE — Progress Notes (Signed)
Summary: meds prob   Phone Note Call from Patient Call back at Home Phone [phone] Message from:  Patient  Caller: Patient Summary of Call: states that she called all her meds in to pharmacy to be refilled - they have been telling her that they have called it in, but have not heard back from [place].  do not see anything in Dr Melvia Heaps box or in computer, was it called into pharmacy line?  She needs all her meds refilled.  pls advise [facility] [place] Initial call taken by: De Nurse,  January 03, 2011 12:17 PM    Prescriptions: NITROSTAT 0.4 MG SUBL (NITROGLYCERIN) 1 tablet under tongue at onset of chest pain; you may repeat every 5 minutes for up to 3 doses.  #25 x 1   Entered by:   Golden Circle RN   Authorized by:   Rodney Langton MD   Signed by:   Golden Circle RN on 01/03/2011   Method used:   Electronically to        [facility] (retail)       [address]       [place], [place]  [address]       Ph: [phone]       Fax: [phone]   RxID:   [id number] FERROUS SULFATE 325 (65 FE) MG TABS (FERROUS SULFATE) One tab by mouth BID  #180 x 2   Entered by:   Golden Circle RN   Authorized by:   Rodney Langton MD   Signed by:   Golden Circle RN on 01/03/2011   Method used:   Electronically to        [facility] (retail)       [address]       [place], [place]  [address]       Ph: [phone]       Fax: [phone]   RxID:   [id number] HYDROCHLOROTHIAZIDE 25 MG TABS (HYDROCHLOROTHIAZIDE) One tab by mouth daily  #90 x 3   Entered by:   Golden Circle RN   Authorized by:   Rodney Langton MD   Signed by:   Golden Circle RN on 01/03/2011   Method used:   Electronically to        [facility] (retail)       [address]       [place], [place]  [address]       Ph: [phone]       Fax: [phone]   RxID:   [id number] PLAVIX 75 MG TABS  (CLOPIDOGREL BISULFATE) RAN OUT 1 tab by mouth once daily  #31 x 3   Entered by:   Golden Circle RN   Authorized by:   Rodney Langton MD   Signed by:   Golden Circle RN on 01/03/2011   Method used:   Electronically to        [facility] (retail)       [address]       [place], [place]  [address]       Ph: [phone]       Fax: [phone]   RxID:   [id number] METFORMIN HCL 1000 MG TABS (METFORMIN HCL) 1 tab by mouth two times a day  #62 x 2  Entered by:   Golden Circle RN   Authorized by:   Rodney Langton MD   Signed by:   Golden Circle RN on 01/03/2011   Method used:   Electronically to        [facility] (retail)       [address]       [place], [place]  [address]       Ph: [phone]       Fax: [phone]   RxID:   [id number] LISINOPRIL 40 MG TABS (LISINOPRIL) 1 tab by mouth once daily  #30 Each x 3   Entered by:   Golden Circle RN   Authorized by:   Rodney Langton MD   Signed by:   Golden Circle RN on 01/03/2011   Method used:   Electronically to        [facility] (retail)       [address]       [place], [place]  [address]       Ph: [phone]       Fax: [phone]   RxID:   [id number] FLONASE 50 MCG/ACT  SUSP (FLUTICASONE PROPIONATE) 2 sprays each nostril daily as needed  #1 x 11   Entered by:   Golden Circle RN   Authorized by:   Rodney Langton MD   Signed by:   Golden Circle RN on 01/03/2011   Method used:   Electronically to        [facility] (retail)       [address]       [place], [place]  [address]       Ph: [phone]       Fax: [phone]   RxID:   [id number] GLIPIZIDE XL 10 MG TB24 (GLIPIZIDE) 2 tablet by mouth daily  #180 x 2   Entered by:   Golden Circle RN   Authorized by:   Rodney Langton MD   Signed by:   Golden Circle RN on 01/03/2011   Method used:    Electronically to        [facility] (retail)       [address]       [place], [place]  [address]       Ph: [phone]       Fax: [phone]   RxID:   [id number] AIMSCO INSULIN SYRINGE 31G X 5/16" 0.3 ML  MISC (INSULIN SYRINGE-NEEDLE U-100) 100 Use as directed with insulin  #100 x 11   Entered by:   Golden Circle RN   Authorized by:   Rodney Langton MD   Signed by:   Golden Circle RN on 01/03/2011   Method used:   Electronically to        [facility] (retail)       [address]       [place], [place]  [address]       Ph: [phone]       Fax: [phone]   RxID:   [id number] COREG 25 MG TABS (CARVEDILOL) One tab by mouth BID  #60 x 3   Entered by:   Golden Circle RN   Authorized by:   Rodney Langton MD  Signed by:   Golden Circle RN on 01/03/2011   Method used:   Electronically to        [facility] (retail)       [address]       [place], [place]  [address]       Ph: [phone]       Fax: [phone]   RxID:   [id number] ZOCOR 80 MG TABS (SIMVASTATIN) One tab by mouth daily  #30 x 2   Entered by:   Golden Circle RN   Authorized by:   Rodney Langton MD   Signed by:   Golden Circle RN on 01/03/2011   Method used:   Electronically to        [facility] (retail)       [address]       [place], [place]  [address]       Ph: [phone]       Fax: [phone]   RxID:   [id number] SYNTHROID 50 MCG TABS (LEVOTHYROXINE SODIUM) One tab by mouth daily  #30 x 3   Entered by:   Golden Circle RN   Authorized by:   Rodney Langton MD   Signed by:   Golden Circle RN on 01/03/2011   Method used:   Electronically to        [facility] (retail)       [address]       [place], [place]  [address]       Ph: [phone]       Fax: [phone]   RxID:    [id number] NORVASC 10 MG TABS (AMLODIPINE BESYLATE) One tab by mouth daily  #30 x 3   Entered by:   Golden Circle RN   Authorized by:   Rodney Langton MD   Signed by:   Golden Circle RN on 01/03/2011   Method used:   Electronically to        [facility] (retail)       [address]       [place], [place]  [address]       Ph: [phone]       Fax: [phone]   RxID:   [id number] LANTUS 100 UNIT/ML SOLN (INSULIN GLARGINE) 10 units in the morning  #1 box x 6   Entered by:   Golden Circle RN   Authorized by:   Rodney Langton MD   Signed by:   Golden Circle RN on 01/03/2011   Method used:   Electronically to        [facility] (retail)       [address]       [place], [place]  [address]       Ph: [phone]       Fax: [phone]   RxID:   [id number] IMDUR 60 MG TB24 (ISOSORBIDE MONONITRATE) One tab by mouth daily  #30 x 1   Entered by:   Golden Circle RN   Authorized by:   Rodney Langton MD   Signed by:   Golden Circle RN on 01/03/2011   Method used:   Electronically to        [facility] (retail)  [address]       [place], [place]  [address]       Ph: [phone]       Fax: [phone]   RxID:   [id number] [facility] ASPIRIN 81 MG CHEW (ASPIRIN) Take 1 tablet by mouth once a day  #100 x 3   Entered by:   Golden Circle RN   Authorized by:   Rodney Langton MD   Signed by:   Golden Circle RN on 01/03/2011   Method used:   Electronically to        [facility] (retail)       [address]       [place], [place]  [address]       Ph: [phone]       Fax: [phone]   RxID:   [id number]

## 2011-01-24 NOTE — Progress Notes (Signed)
   Phone Note Call from Patient   Summary of Call: Having pressure and urinary frequency. Thinks have UTI. No fever or chills. Advised to go to urgent care today.  Pt agreeable to plan.  Initial call taken by: Clementeen Graham MD,  December 24, 2010 8:09 AM

## 2011-02-06 ENCOUNTER — Encounter: Payer: Self-pay | Admitting: Sports Medicine

## 2011-02-06 ENCOUNTER — Ambulatory Visit
Admission: RE | Admit: 2011-02-06 | Discharge: 2011-02-06 | Disposition: A | Discharge status: Home / Self Care | Payer: Medicare Other | Source: Ambulatory Visit | Attending: Family Medicine | Admitting: Family Medicine

## 2011-02-06 ENCOUNTER — Ambulatory Visit (INDEPENDENT_AMBULATORY_CARE_PROVIDER_SITE_OTHER): Payer: Medicare Other | Admitting: Sports Medicine

## 2011-02-06 DIAGNOSIS — M79672 Pain in left foot: Secondary | ICD-10-CM

## 2011-02-06 DIAGNOSIS — I10 Essential (primary) hypertension: Secondary | ICD-10-CM

## 2011-02-06 DIAGNOSIS — D649 Anemia, unspecified: Secondary | ICD-10-CM

## 2011-02-06 DIAGNOSIS — E039 Hypothyroidism, unspecified: Secondary | ICD-10-CM

## 2011-02-06 DIAGNOSIS — E7800 Pure hypercholesterolemia, unspecified: Secondary | ICD-10-CM

## 2011-02-06 DIAGNOSIS — M79609 Pain in unspecified limb: Secondary | ICD-10-CM

## 2011-02-06 DIAGNOSIS — E119 Type 2 diabetes mellitus without complications: Secondary | ICD-10-CM

## 2011-02-06 DIAGNOSIS — Z0000 Encounter for general adult medical examination without abnormal findings: Secondary | ICD-10-CM

## 2011-02-06 LAB — COMPREHENSIVE METABOLIC PANEL
ALT: 18 U/L (ref 0–35)
Albumin: 4.3 g/dL (ref 3.5–5.2)
CO2: 28 mEq/L (ref 19–32)
Chloride: 102 mEq/L (ref 96–112)
Potassium: 4 mEq/L (ref 3.5–5.3)
Sodium: 139 mEq/L (ref 135–145)
Total Bilirubin: 0.5 mg/dL (ref 0.3–1.2)
Total Protein: 7.1 g/dL (ref 6.0–8.3)

## 2011-02-06 LAB — CBC
Platelets: 170 10*3/uL (ref 150–400)
RBC: 4.06 MIL/uL (ref 3.87–5.11)
WBC: 5.4 10*3/uL (ref 4.0–10.5)

## 2011-02-06 LAB — POCT GLYCOSYLATED HEMOGLOBIN (HGB A1C): Hemoglobin A1C: 7.4

## 2011-02-06 LAB — TSH: TSH: 0.884 u[IU]/mL (ref 0.350–4.500)

## 2011-02-06 NOTE — Assessment & Plan Note (Signed)
Still taking Iron. Due for CBC. Last Hb mildly Low but improved.

## 2011-02-06 NOTE — Assessment & Plan Note (Signed)
Checking TSH. On synthroid.

## 2011-02-06 NOTE — Assessment & Plan Note (Signed)
TTP base of 5th MT. ? Stress/Jones fx w/ poor shoes and not relieved by tylenol and without overt trauma.

## 2011-02-06 NOTE — Assessment & Plan Note (Signed)
Well controlled. Checking CMET.

## 2011-02-06 NOTE — Progress Notes (Signed)
  Subjective:    Patient ID: [name], female    DOB: 1941-11-19, 70 y.o.   MRN: [id number]  HPI 70 yo female here for well woman exam. She has HTN, HLD, DM2, hypothyroid:  See A/P for details.  L foot pain:  Present for several weeks now, TTP over dorsal base 5th MT.  Worse with palpation but not with walking.  No injury, no swelling, no redness.  Hasnt taken anything for it but has tylenol 650.  Described as "sore."  No radiation.     Review of Systems    See HPI Objective:   Physical Exam  Constitutional: She is oriented to person, place, and time. She appears well-developed and well-nourished. No distress.  HENT:  Head: Normocephalic and atraumatic.  Right Ear: External ear normal.  Left Ear: External ear normal.  Nose: Nose normal.  Mouth/Throat: Oropharynx is clear and moist. No oropharyngeal exudate.  Eyes: Conjunctivae and EOM are normal. Pupils are equal, round, and reactive to light. Right eye exhibits no discharge. Left eye exhibits no discharge. No scleral icterus.  Neck: Normal range of motion. Neck supple. No thyromegaly present.  Cardiovascular: Normal rate and regular rhythm.  Exam reveals no gallop and no friction rub.   No murmur heard. Pulmonary/Chest: Effort normal and breath sounds normal. No respiratory distress. She has no wheezes. She has no rales. She exhibits no tenderness.  Abdominal: Bowel sounds are normal. She exhibits no distension and no mass. There is no tenderness. There is no rebound and no guarding.  Musculoskeletal:       L and R Ankle: No visible erythema or swelling. Range of motion is full in all directions. Strength is 5/5 in all directions. Stable lateral and medial ligaments; squeeze test and kleiger test unremarkable;  Talar dome nontender; Minimal pain on dorsal aspect at base of 5th MT on L; No tenderness over cuboid; No tenderness over N spot or navicular prominence No tenderness on posterior aspects of lateral and medial  malleolus No sign of peroneal tendon subluxations or tenderness to palpation Negative tarsal tunnel tinel's Able to walk 4 steps.     Lymphadenopathy:    She has no cervical adenopathy.  Neurological: She is alert and oriented to person, place, and time.  Skin: Skin is warm and dry. She is not diaphoretic.          Assessment & Plan:

## 2011-02-06 NOTE — Patient Instructions (Signed)
Great to see you, Checking lots of bloodwork. Go to [facility] on [address] for [employer] of your foot. Come back to see me in 2 weeks if foot still hurting otherwise 6 months.  -Dr. Karie Schwalbe.

## 2011-02-06 NOTE — Assessment & Plan Note (Signed)
Checking A1c. Last was 7.8% Oct 2011. CMET. Morning CBGs have been OK.

## 2011-02-06 NOTE — Assessment & Plan Note (Signed)
Due for FLP. Taking Zocor.

## 2011-02-07 LAB — LDL CHOLESTEROL, DIRECT: Direct LDL: 76 mg/dL

## 2011-02-17 ENCOUNTER — Emergency Department (HOSPITAL_COMMUNITY)
Admission: EM | Admit: 2011-02-17 | Discharge: 2011-02-17 | Disposition: A | Discharge status: Home / Self Care | Payer: Medicare Other | Attending: Emergency Medicine | Admitting: Emergency Medicine

## 2011-02-17 ENCOUNTER — Emergency Department (HOSPITAL_COMMUNITY): Payer: Medicare Other

## 2011-02-17 DIAGNOSIS — E119 Type 2 diabetes mellitus without complications: Secondary | ICD-10-CM | POA: Insufficient documentation

## 2011-02-17 DIAGNOSIS — W208XXA Other cause of strike by thrown, projected or falling object, initial encounter: Secondary | ICD-10-CM | POA: Insufficient documentation

## 2011-02-17 DIAGNOSIS — I2510 Atherosclerotic heart disease of native coronary artery without angina pectoris: Secondary | ICD-10-CM | POA: Insufficient documentation

## 2011-02-17 DIAGNOSIS — Y92009 Unspecified place in unspecified non-institutional (private) residence as the place of occurrence of the external cause: Secondary | ICD-10-CM | POA: Insufficient documentation

## 2011-02-17 DIAGNOSIS — I10 Essential (primary) hypertension: Secondary | ICD-10-CM | POA: Insufficient documentation

## 2011-02-17 DIAGNOSIS — E785 Hyperlipidemia, unspecified: Secondary | ICD-10-CM | POA: Insufficient documentation

## 2011-02-17 DIAGNOSIS — M79609 Pain in unspecified limb: Secondary | ICD-10-CM | POA: Insufficient documentation

## 2011-02-17 DIAGNOSIS — I252 Old myocardial infarction: Secondary | ICD-10-CM | POA: Insufficient documentation

## 2011-02-17 DIAGNOSIS — S9030XA Contusion of unspecified foot, initial encounter: Secondary | ICD-10-CM | POA: Insufficient documentation

## 2011-02-17 LAB — GLUCOSE, CAPILLARY: Glucose-Capillary: 91 mg/dL (ref 70–99)

## 2011-03-04 LAB — URINE CULTURE
Colony Count: >=100000
Culture  Setup Time: 201201022027

## 2011-03-04 LAB — POCT URINALYSIS DIPSTICK
Nitrite: POSITIVE — AB
Protein, ur: NEGATIVE mg/dL
pH: 6 (ref 5.0–8.0)

## 2011-03-07 LAB — POCT URINALYSIS DIPSTICK
Ketones, ur: NEGATIVE mg/dL
Protein, ur: 30 mg/dL — AB
Urobilinogen, UA: 0.2 mg/dL (ref 0.0–1.0)
pH: 7 (ref 5.0–8.0)

## 2011-04-03 LAB — GLUCOSE, CAPILLARY
Glucose-Capillary: 146 mg/dL — ABNORMAL HIGH (ref 70–99)
Glucose-Capillary: 153 mg/dL — ABNORMAL HIGH (ref 70–99)
Glucose-Capillary: 169 mg/dL — ABNORMAL HIGH (ref 70–99)
Glucose-Capillary: 84 mg/dL (ref 70–99)

## 2011-04-04 LAB — CBC
HCT: 28.7 % — ABNORMAL LOW (ref 36.0–46.0)
HCT: 31.6 % — ABNORMAL LOW (ref 36.0–46.0)
Hemoglobin: 10.9 g/dL — ABNORMAL LOW (ref 12.0–15.0)
Platelets: 123 10*3/uL — ABNORMAL LOW (ref 150–400)
Platelets: 126 10*3/uL — ABNORMAL LOW (ref 150–400)
Platelets: 126 10*3/uL — ABNORMAL LOW (ref 150–400)
RBC: 4.04 MIL/uL (ref 3.87–5.11)
RDW: 16.4 % — ABNORMAL HIGH (ref 11.5–15.5)
WBC: 4.3 10*3/uL (ref 4.0–10.5)
WBC: 4.4 10*3/uL (ref 4.0–10.5)
WBC: 5.8 10*3/uL (ref 4.0–10.5)

## 2011-04-04 LAB — BASIC METABOLIC PANEL
BUN: 4 mg/dL — ABNORMAL LOW (ref 6–23)
BUN: 6 mg/dL (ref 6–23)
BUN: 9 mg/dL (ref 6–23)
CO2: 28 mEq/L (ref 19–32)
Calcium: 8.9 mg/dL (ref 8.4–10.5)
Calcium: 9.2 mg/dL (ref 8.4–10.5)
Chloride: 104 mEq/L (ref 96–112)
Creatinine, Ser: 0.67 mg/dL (ref 0.4–1.2)
Creatinine, Ser: 0.73 mg/dL (ref 0.4–1.2)
GFR calc Af Amer: >60 mL/min (ref >60)
GFR calc Af Amer: >60 mL/min (ref >60)
GFR calc non Af Amer: >60 mL/min (ref >60)
GFR calc non Af Amer: >60 mL/min (ref >60)
GFR calc non Af Amer: >60 mL/min (ref >60)
Potassium: 3.5 mEq/L (ref 3.5–5.1)
Potassium: 3.6 mEq/L (ref 3.5–5.1)
Sodium: 139 mEq/L (ref 135–145)
Sodium: 140 mEq/L (ref 135–145)

## 2011-04-04 LAB — IRON AND TIBC
Iron: 56 ug/dL (ref 42–135)
Saturation Ratios: 18 % — ABNORMAL LOW (ref 20–55)
TIBC: 303 ug/dL (ref 250–470)
UIBC: 247 ug/dL

## 2011-04-04 LAB — PROTEIN ELECTROPHORESIS, SERUM
Albumin ELP: 55.1 % — ABNORMAL LOW (ref 55.8–66.1)
Alpha-1-Globulin: 4.7 % (ref 2.9–4.9)
Alpha-2-Globulin: 14.2 % — ABNORMAL HIGH (ref 7.1–11.8)
Beta Globulin: 6 % (ref 4.7–7.2)
Total Protein ELP: 6.5 g/dL (ref 6.0–8.3)

## 2011-04-04 LAB — PROTIME-INR: INR: 0.9 (ref 0.00–1.49)

## 2011-04-04 LAB — GLUCOSE, CAPILLARY
Glucose-Capillary: 138 mg/dL — ABNORMAL HIGH (ref 70–99)
Glucose-Capillary: 155 mg/dL — ABNORMAL HIGH (ref 70–99)
Glucose-Capillary: 166 mg/dL — ABNORMAL HIGH (ref 70–99)
Glucose-Capillary: 77 mg/dL (ref 70–99)
Glucose-Capillary: 84 mg/dL (ref 70–99)
Glucose-Capillary: 90 mg/dL (ref 70–99)

## 2011-04-04 LAB — POCT CARDIAC MARKERS
CKMB, poc: 1.7 ng/mL (ref 1.0–8.0)
Myoglobin, poc: 58.7 ng/mL (ref 12–200)
Troponin i, poc: <0.05 ng/mL (ref 0.00–0.09)

## 2011-04-04 LAB — CARDIAC PANEL(CRET KIN+CKTOT+MB+TROPI)
Relative Index: 1.6 (ref 0.0–2.5)
Total CK: 107 U/L (ref 7–177)
Troponin I: <0.01 ng/mL (ref 0.00–0.06)

## 2011-04-04 LAB — LIPID PANEL
Cholesterol: 95 mg/dL (ref 0–200)
Triglycerides: 102 mg/dL (ref <150)

## 2011-04-04 LAB — DIFFERENTIAL
Lymphs Abs: 2.7 10*3/uL (ref 0.7–4.0)
Monocytes Absolute: 0.7 10*3/uL (ref 0.1–1.0)
Monocytes Relative: 12 % (ref 3–12)
Neutro Abs: 2.1 10*3/uL (ref 1.7–7.7)
Neutrophils Relative %: 37 % — ABNORMAL LOW (ref 43–77)

## 2011-04-04 LAB — VITAMIN B12: Vitamin B-12: 185 pg/mL — ABNORMAL LOW (ref 211–911)

## 2011-04-04 LAB — TROPONIN I: Troponin I: <0.01 ng/mL (ref 0.00–0.06)

## 2011-04-04 LAB — APTT: aPTT: 29 seconds (ref 24–37)

## 2011-04-04 LAB — RETICULOCYTES: RBC.: 3.83 MIL/uL — ABNORMAL LOW (ref 3.87–5.11)

## 2011-05-07 NOTE — H&P (Signed)
NAMEJHOSELYN, RUFFINI             ACCOUNT NO.:  [id number]   MEDICAL RECORD NO.:  [id number]          PATIENT TYPE:  INP   LOCATION:  4709                         FACILITY:  [facility]   PHYSICIAN:  Unice Cobble, MD     DATE OF BIRTH:  [birth year]   DATE OF ADMISSION:  03/13/2009  DATE OF DISCHARGE:                              HISTORY & PHYSICAL   CARDIOLOGIST:  Theron Arista C. Eden Emms, M.D., [facility]   CHIEF COMPLAINT:  Chest pain.   HISTORY OF THE PRESENT ILLNESS:  This is a 70 year old African American  female with a history of angioplasty, diabetes, hypertension and  hyperlipidemia who had chest pain starting today 5:30 P.M. at rest after  an argument.  She had substernal chest tightness without radiation.  No  diaphoresis, shortness of breath, palpitations or presyncope.  She did  have some mild nausea.  She tells me that this is very similar to the  pain she had prior to her last angioplasty, except when she had a prior  angioplasty the pain also radiated through to her back.  She also gets  mild exertional chest pain.  Her pain was relieved in the emergency  department at about 7:00 P.M. with two sublingual nitroglycerin and  aspirin.  She did not experience orthopnea, PND or edema at home.  She  does sleep on three pillows, although she cannot exactly tell me why.   PAST MEDICAL AND SURGICAL HISTORY:  1. Coronary artery disease status post angioplasty to the PDA.  Her      last catheterization per [facility] was on March 07, 2003, which      showed 20% left main, 30-40% proximal-to-mid LAD, 30% diagonal-1,      50% PDA, and an EF of 60%.  2. Diabetes mellitus.  3. Hypertension.  4. Hyperlipidemia.  5. Hypothyroidism status post thyroidectomy.  6. History of gastritis  7. Status post cholecystectomy.   ALLERGIES:  SEPTRA CAUSES GI UPSET.   MEDICATIONS:  1. Coreg 25 mg twice a day.  2. Isosorbide mononitrate 60 mg daily.  3. Levothyroxine 50 mcg daily.  4.  Lisinopril/hydrochlorothiazide 20/12.5 mg daily.  5. Metformin 1,000 mg twice a day.  6. Norvasc 10 mg daily.  7. Simvastatin 80 mg daily.  8. Glipizide 10 mg daily.  9. Garlic.  10.Aspirin 81 mg daily.  11.Lantus 8 units every morning.   SOCIAL HISTORY:  The patient lives in [place] with her granddaughter.  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  She quit smoking in the 1970s.  No alcohol or drugs.   FAMILY HISTORY:  The patient states there is coronary artery disease and  two siblings.   REVIEW OF SYSTEMS:  Complete review of systems is done and is found to  be otherwise negative, except as stated in the HPI.   PHYSICAL EXAMINATION:  VITAL SIGNS:  Temperature is 98.1 with a pulse of  68, respiratory rate is 20, blood pressure is 153/51, and O2 sat is 99%  on 2 liters.  GENERAL APPEARANCE:  In general she is obese and in no acute distress.  HEENT:  Exam shows  normocephalic and atraumatic head .  PERRLA.  Moist  mucous membranes.  Oropharynx without erythema or exudates.  NECK:  The neck is supple without lymphadenopathy, thyromegaly, bruits  or jugular venous distention.  HEART:  The heart has a regular rate and rhythm with a normal S1 and S2.  There are no murmurs, gallops or rubs.  Pulses are 2+ and equal  bilaterally without bruits.  LUNGS:  The lungs are clear to auscultation bilaterally.  SKIN:  The skin reveals no rashes or lesions.  ABDOMEN:  The abdomen is soft and nontender with normal bowel sounds.  No rebound or guarding.  EXTREMITIES:  The extremities show no cyanosis, clubbing or edema.  Two  plus pulses bilaterally.  MUSCULOSKELETAL:  The musculoskeletal exam shows no joint deformity or  effusions.  No spine or CVA tenderness.  NEUROLOGIC EXAMINATION:  Neurologically she is alert and oriented x3 with cranial nerves II-XII  grossly intact.  Strength is 5/5 in all extremities and axial groups.   LABORATORY AND ANCILLARY DATA:  Radiology and laboratory review:   Chest  x-ray showed mild cardiac enlargement with clear lung fields.  EKG  showed a rate of 67 and  normal sinus rhythm.  She has inferior T-wave  inversions and anteroseptal T-wave inversions; these are unchanged from  prior EKGs.  Her labs show a normal white count and a hemoglobin of  10.9.  Her potassium is low at 3.0.  Her creatinine is 0.73.  Cardiac  markers are negative at this time.   ASSESSMENT AND PLAN:  1. This is a 70 year old African American female with multiple risk      factors and a history of coronary artery disease status post      angioplasty who presents with chest pain concerning for unstable      angina.  She will be admitted for rule out myocardial infarction.      I will continue her home medications for coronary artery disease      and hypertension.  If troponin is positive heparin will be started      as well as Plavix.  Catheterization versus stress testing in the      morning per the attending's discretion.  2. For the patient's diabetes mellitus she will be put on insulin      sliding scale and her glipizide.  Her metformin will be      discontinued pending further testing.  Lantus will be given at half      dose.  3. Gastrointestinal and deep venous thrombosis prophylaxes are      warranted.      Unice Cobble, MD  Electronically Signed     ACJ/MEDQ  D:  03/13/2009  T:  03/14/2009  Job:  [id number]

## 2011-05-07 NOTE — Cardiovascular Report (Signed)
NAMEANNAYA, BANGERT             ACCOUNT NO.:  [id number]   MEDICAL RECORD NO.:  [id number]          PATIENT TYPE:  INP   LOCATION:  2502                         FACILITY:  [facility]   PHYSICIAN:  Everardo Beals. Juanda Chance, MD, FACCDATE OF BIRTH:  1941-08-01   DATE OF PROCEDURE:  03/15/2009  DATE OF DISCHARGE:                            CARDIAC CATHETERIZATION   CLINICAL HISTORY:  Mrs. Graeff is 70 years old and has a history of a  previous angioplasty of a posterior descending branch of the right  coronary artery.  She is admitted with chest pain and was studied  yesterday by Dr. [name] and found to have a 90% stenosis in the ostium  of a moderately large diagonal branch of the LAD.  She is brought back  today for intervention.  She is anemic but has a negative stool.  Her  anemia is microcytic.  Further studies are pending.   PROCEDURE:  The procedure was performed via the right femoral artery  using arterial sheath and 6-French Q 3.5 guiding catheter.  The patient  was given Angiomax bolus infusion and had been given an extra 300 mg of  Plavix this morning and 4 chewable aspirin.  We passed Prowater wire  down both the LAD and diagonal branch.  We then used a 2.25 x 10-mm  cutting balloon and performed 3 inflations up to 7 atmospheres for 35  seconds.  This did not completely resolve the lesion in the ostium.  We  felt we may not be quite far back enough with the cutting balloon.  We  went into the LAO caudal view where we could see the ostium a little  better and performed 3 more inflation up to 7 atmospheres for 30  seconds.  This gave a nice angiographic result.  Both wires were removed  and final diagnostics were performed through the guiding catheter.  The  patient tolerated the procedure well and left laboratory in satisfactory  condition.   RESULTS:  Initial stenosis in diagonal branch was estimated at 80-90%.  Following stenting, this improved to less than 20%.   CONCLUSION:   Successful PCI of the ostial lesion of the diagonal branch  of the LAD using a cutting balloon with improvement in center narrowing  from 80% to less than 20%.   DISPOSITION:  The patient was returned to Post Angio Unit for further  observation.   ADDENDUM:  If the patient developed recurrence and needs another  procedure, there is some plaque in the LAD adjacent to the diagonal  branch, and if the diagonal branch is stented, she would likely also  require a stent in the LAD.  Hopefully, we can get by with this more  conservative approach.       Bruce Elvera Lennox Juanda Chance, MD, [facility]  Electronically Signed     BRB/MEDQ  D:  03/15/2009  T:  03/16/2009  Job:  [id number]   cc:   Noralyn Pick. Eden Emms, MD, [facility]  Arturo Morton. [name], MD, [facility]  Cardiopulmonary Lab

## 2011-05-07 NOTE — Cardiovascular Report (Signed)
[name], [name]             ACCOUNT NO.:  [id number]   MEDICAL RECORD NO.:  [id number]          PATIENT TYPE:  INP   LOCATION:  2924                         FACILITY:  [facility]   PHYSICIAN:  Arturo Morton. [name], MD, FACCDATE OF BIRTH:  06/12/1941   DATE OF PROCEDURE:  03/14/2009  DATE OF DISCHARGE:                            CARDIAC CATHETERIZATION   INDICATIONS:  [name] is a delightful lady who previously underwent  PTCA of the proximal PDA in 2001.  She got into an argument yesterday  and developed chest pain.  The current study was done to assess coronary  anatomy.  Of note, the patient has a significant anemia that is  microcytic.  She denies any known blood in the stool.  The current study  was done to assess coronary anatomy.   PROCEDURE:  1. Left heart catheterization.  2. Selective coronary angiography.  3. Selective left ventriculography.   DESCRIPTION OF PROCEDURE:  The patient was brought to the  Catheterization Laboratory after informed consent, prepped and draped in  the usual fashion.  Through an anterior puncture, the femoral artery was  entered, although it was somewhat difficult to find the vessel because  of its size.  A 5-French sheath was ultimately placed.  Following this,  views of the left and right coronary artery was then obtained in  multiple angiographic projections.  Central aortic and left ventricular  pressures were measured with pigtail.  Ventriculography was performed in  the RAO projection.  There were no major complications and she was taken  to the holding area for sheath removal.   HEMODYNAMIC DATA:  1. The central aortic pressure was 125/57, mean 84.  2. Left ventricular pressure 149/15.  3. There was no gradient on pullback across aortic valve.   ANGIOGRAPHIC DATA:  1. On plain fluoroscopy, there is some calcification of the coronary      arteries.  2. The left main demonstrates minimal irregularity at its distal-most       aspect  3. The left anterior descending artery courses to the apex.  In the      midportion of the vessel, subtending the origin of the diagonal and      crossing the septal perforator, there is an area of about 30-40%      narrowing.  It does not appear to be high grade.  The rest of the      LAD crosses the apex and is without significant narrowing.  There      is a diagonal branch that is moderately large in size that has      about an 80%-90% proximal stenosis.  It involves the ostium and the      diagonal and is not optimal for percutaneous intervention.  The      circumflex provides a major marginal, as well as AV circumflex.      There is mild calcification proximally, but without critical      narrowing.  There is mild luminal irregularity in these vessels      without significant focal obstruction.  4. The right coronary artery has some proximal  calcification.  There      is about 30% narrowing at the crux and about 50% narrowing      involving the ostium of the PDA, but none of these appear to be      tight.  Posterolateral system is moderate in size.  5. Ventriculography in the RAO projection reveals preserved global      systolic function.  No segmental abnormalities or contraction      identified.   CONCLUSION:  1. Well-preserved overall LV function.  2. Continued patency of the previously dilated proximal PDA.  3. High-grade stenosis of the diagonal which is new from the previous      study.  4. Microcytic anemia.   DISPOSITION:  The patient has not had positive enzymes nor is she  unstable.  She does have some T-wave inversion, but this is apparently  similar to prior tracings, perhaps a slightly more prominent than  December 2007 tracing.  Based on this, she may require percutaneous  intervention prior to that, however, we will go ahead and get Hemoccult  studies and iron studies to better assess this.  The exact approach to  the diagonal lesion is also somewhat  unclear.  The disease extends up to  the LAD and the LAD itself has some mild disease, so therefore could be  compromised if stents were placed.  We may opt for a simple cutting  balloon intervention of this vessel.  Once this is clear, her stools are  negative and hemoglobin is stable, I will review the options with my  colleagues.      Arturo Morton. [name], MD, [facility]  Electronically Signed     TDS/MEDQ  D:  03/14/2009  T:  03/15/2009  Job:  [id number]

## 2011-05-07 NOTE — Discharge Summary (Signed)
[name], [name]             ACCOUNT NO.:  [id number]   MEDICAL RECORD NO.:  [id number]          PATIENT TYPE:  INP   LOCATION:  2502                         FACILITY:  [facility]   PHYSICIAN:  Noralyn Pick. Eden Emms, MD, FACCDATE OF BIRTH:  1941/05/24   DATE OF ADMISSION:  03/13/2009  DATE OF DISCHARGE:  03/16/2009                               DISCHARGE SUMMARY   PRIMARY CARDIOLOGIST:  Theron Arista C. Eden Emms, MD, [facility]   PRIMARY CARE PHYSICIAN:  Not listed.   PROCEDURES PERFORMED DURING HOSPITALIZATION:  1. Cardiac catheterization, dated March 14, 2009, performed by Dr.      Shawnie Pons, revealing stenosis of the diagonal branch with an      estimated 80-90% with successful percutaneous coronary intervention      of the ostial lesion of the diagonal branch of the left anterior      descending using a cutting balloon with improvement in center      narrowing from 80% to 20%.  2. Cardiac catheterization, dated March 14, 2009, revealing high-grade      stenosis of the diagonal which is new from previous study.      Discussion on percutaneous coronary intervention was completed at      that time with subsequent percutaneous coronary intervention as      discussed above.   FINAL DISCHARGE DIAGNOSES:  1. Coronary artery disease.      a.     Status post angioplasty to the posterior descending artery.      b.     Cardiac catheterization in March 2004 showed a 20% left main       through 40% proximal to mid left anterior descending, 30%       diagonal 1, 50% posterior descending artery with an ejection       fraction of 60%.      c.     Status post cardiac catheterization on March 14, 2009,       revealing high-grade stenosis of the diagonal which is new from       previous study.      d.     Status post percutaneous coronary intervention of the ostial       lesion of the diagonal branch of the left anterior descending       reducing it from 80% to less than 20%.  2. Diabetes.  3.  Hypertension.  4. Hyperlipidemia.  5. Hypothyroidism status post thyroidectomy.  6. History of gastritis.  7. Status post cholecystectomy.   HOSPITAL COURSE:  A 70 year old African American female with above-  mentioned history who was admitted to [facility] after  complaints of substernal chest tightness without radiation.  The patient  states the pain was very similar to the pain she had prior to her last  angioplasty except that the pain radiated through to her back.  She has  mild exertional chest pain as well.  The patient was given nitroglycerin  and aspirin on admission and subsequent cardiac catheterization was  planned the following day.  The patient did undergo cardiac  catheterization as described above by Dr. Maisie Fus  Stuckey revealing an  ostial first diagonal lesion between 80 and 90%.  The patient did  subsequently have a PCI to the ostial lesion per Dr. [name] reducing it  from 80% to 20%.  The patient recovered well without any evidence of  bleeding, hematoma, or signs of infection.  The patient was placed on  Plavix and statins and will continue this as an outpatient.   The patient was seen and examined by Dr. Charlton Haws on day of  discharge and found to be stable.  The patient was anxious to return  home.  Vital signs were stable.  She is to return home on medications as  directed with the exception of an increase in lisinopril to 40 mg daily  along with other medications and addition of Plavix and aspirin.   Sodium 140, potassium 3.5, chloride 104, CO2 29, glucose 135, BUN 6,  creatinine 0.66.  Hemoglobin 10.6, hematocrit 31.6, white blood cells  4.3, platelets 126.  Cardiac panel was negative x2.   Chest x-ray, dated March 13, 2009, revealing mild cardiac enlargement.  No acute abnormalities.  EKG on discharge, sinus bradycardia with T-wave abnormality noted  inferiorly and anterolaterally.  Ventricular rate of 57 beats per  minute.   DISCHARGE  MEDICATIONS:  1. Lisinopril 40 mg daily (new higher dose, prescription provided).  2. Enteric-coated aspirin 325 daily.  3. Simvastatin 80 mg daily.  4. Levothyroxine 50 mcg daily.  5. Hydrochlorothiazide 12.5 mg daily.  6. Norvasc 10 mg daily.  7. Coreg 25 mg twice a day.  8. Isosorbide mononitrate 60 mg daily.  9. Glucotrol 10 mg daily.  10.Protonix 40 mg daily.  11.Lantus insulin 4 units daily (8 units were taken at home      preadmission).  12.Plavix 75 mg daily (new prescription provided).  13.Nitroglycerin 0.4 mg sublingual p.r.n. chest pain (new prescription      provided).   ALLERGIES:  SULFA and BACTRIM.   FOLLOWUP PLANS AND APPOINTMENTS:  1. The patient will follow up with Dr. Charlton Haws on March 31, 2009,      at 10:30 a.m.  2. The patient is given post cardiac catheterization instructions with      particular emphasis on the right groin site for evidence of      bleeding, hematoma, and signs of infection.  3. The patient has been advised on new prescription of higher dose      lisinopril, Plavix, and nitroglycerin.   Time spent with the patient to include physician time 35 minutes.      Bettey Mare. Lyman Bishop, NP      Noralyn Pick. Eden Emms, MD, [facility]  Electronically Signed    KML/MEDQ  D:  03/16/2009  T:  03/16/2009  Job:  [id number]

## 2011-05-10 NOTE — Cardiovascular Report (Signed)
   NAME:  [name], [name]                       ACCOUNT NO.:  [id number]   MEDICAL RECORD NO.:  [id number]                   PATIENT TYPE:  OIB   LOCATION:  2899                                 FACILITY:  [facility]   PHYSICIAN:  Noralyn Pick. Eden Emms, M.D. [facility]           DATE OF BIRTH:  December 03, 1941   DATE OF PROCEDURE:  DATE OF DISCHARGE:  07/27/2002                              CARDIAC CATHETERIZATION   PROCEDURE:  Coronary arteriography.   INDICATION:  Status post previous PTCA of the PDA with a Cardiolite  suggesting anterior apical wall ischemia.   DESCRIPTION OF PROCEDURE:  Standard catheterization was done from the right  coronary artery.   RESULTS:  Left main coronary artery had 20% proximal stenosis.   Left anterior descending artery had 30% multiple discrete lesions in the mid  and distal portion.   The circumflex coronary artery was normal.   The right coronary artery was dominant. It was essentially normal with only  a 20-30% ostial PDA lesion.   RIGHT ANTERIOR OBLIQUE VENTRICULOGRAPHY:  RAO ventriculography was normal.  EF was in excess of 65%. There was no gradient across the aortic valve and  no MR.   Aortic pressure is 150/70, LV pressure is 150/10.   IMPRESSION:  The patient would appear to have a false-positive Cardiolite  study due to her body habitus. She has had previous chest pain from reflux  symptoms. She will continue to be treated medically. She will be discharged  home later today if her groin heals well.                                                      Noralyn Pick. Eden Emms, M.D. [facility]   PCN/MEDQ  D:  07/27/2002  T:  07/31/2002  Job:  [id number]   cc:   Raynelle Jan, MD  [address]  [place], [place] [address]  Fax: [phone]

## 2011-05-10 NOTE — Op Note (Signed)
   NAME:  [name], [name]                       ACCOUNT NO.:  [id number]   MEDICAL RECORD NO.:  [id number]                   PATIENT TYPE:  AMB   LOCATION:  ENDO                                 FACILITY:  [facility]   PHYSICIAN:  John C. Madilyn Fireman, M.D.                 DATE OF BIRTH:  1941-05-11   DATE OF PROCEDURE:  08/09/2003  DATE OF DISCHARGE:                                 OPERATIVE REPORT   PROCEDURE:  Colonoscopy.   DESCRIPTION OF PROCEDURE:  The patient was placed in the left lateral  decubitus position then placed on the pulse monitor with continuous low flow  oxygen delivered by nasal cannula. She was sedated with 100 mcg IV fentanyl  and 9 mg IV Versed. The Olympus video colonoscope was inserted into the  rectum and advanced to the cecum, confirmed by visualization of the  ileocecal valve. However I could not advance the scope beyond the ileocecal  valve part of the cecum was not well seen despite multiple position changes,  abdominal pressure and torquing maneuvers. The ileocecal valve and distal  visualized cecum appeared normal as did the ascending, transverse,  descending, sigmoid and rectum. There were no significant visible internal  hemorrhoids.  The scope was then withdrawn and the patient returned to the  recovery room in stable condition. The patient tolerated the procedure well  and there were no immediate complications.   IMPRESSION:  Normal colonoscopy with limited view of the cecum.   PLAN:  Next colon screening by sigmoidoscopy in five years.                                               John C. Madilyn Fireman, M.D.    JCH/MEDQ  D:  08/09/2003  T:  08/09/2003  Job:  [id number]   cc:   Nilda Simmer, M.D.  Family Medicine Resident [id number]  Fax: [phone]

## 2011-05-10 NOTE — H&P (Signed)
[place]. [facility]  Patient:    [name], [name]                    MRN: [id number] Adm. Date:  [id number] Attending:  Asencion Partridge L [name]:   [name], M.D.                         History and Physical  CHIEF COMPLAINT:  Chest pain/epigastric pain.  HISTORY OF PRESENT ILLNESS:  [name] is a 70 year old African-American female with a history of coronary artery disease and status post stent placement in November 2001, who presented with a complaint of gradual-onset chest tightening and burning after lunch today.  This pain occurred at rest, was described as 7 out of 10 intensity, burning, tightness, no radiation, no shortness of breath.  Mild nausea noted, no diaphoresis.  The pain was minimally responsive to two nitroglycerin.  At that point patient called M.D. and was told to take third nitroglycerin and present to the emergency department.  Since patients stent placement in November 2001, she says that she has had mild, stable angina (mild chest tightness) with walking several blocks, which immediately resolves with rest.  She describes the present pain as the same pain as when she presented in November and got her stent placed.  PAST MEDICAL HISTORY:  1. Diabetes mellitus type 2.  2. Hypercholesterolemia.  3. Hypertension.  4. Hypothyroidism (history of thyroid cancer, status post thyroid surgery).  5. Postmenopausal, currently on hormone replacement therapy.  MEDICATIONS:  1. Aspirin 325 mg per day.  2. Atenolol 50 mg per day.  3. Avandia 4 mg per day.  4. Glucotrol 5 mg per day.  5. Hydrochlorothiazide 25 mg per day.  6. Imdur 60 mg per day.  7. Metformin 850 mg t.i.d.  8. Multivitamin one per day.  9. Nitroglycerin 0.4 mg sublingual p.r.n. 10. Pravachol 20 mg q.d. 11. Prempro 0.625/2.5 mg q.d. 12. Prinivil 20 mg q.d. 13. Protonix 40 mg q.d. 14. Synthroid 0.075 mg per day.  FAMILY HISTORY:  Diabetes and  hypertension.  SOCIAL HISTORY:  Distant smoking history, quit 15 years ago.  No alcohol or drugs.  Patient is separated and has six children and lives alone.  ALLERGIES:  SEPTRA reaction is GI intolerance.  REVIEW OF SYSTEMS:  No shortness of breath, wheeze, or cough.  No musculoskeletal complaints.  Positive for easy fatigability, which has been present chronically.  GENITOURINARY:  No dysuria, frequency, or urgency.  PHYSICAL EXAMINATION:  VITAL SIGNS:  Temperature 98.7, pulse 80, blood pressure 183/77, respirations 18.  GENERAL:  Pleasant, in no apparent distress.  HEENT:  Pupils equal, round and reactive to light and accommodation. Extraocular movements intact.  Oral mucosa pink and moist.  NECK:  Supple, no lymphadenopathy.  CHEST:  Lungs clear to auscultation bilaterally.  Slightly decreased breath sounds in the bases bilaterally, no crackles or wheezes.  Nonlabored respirations.  The xiphoid process was slightly tender to palpation.  CARDIAC:  Regular rhythm and rate.  No murmur, rub, or gallop.  Heart sounds were slightly distant.  Peripheral pulses were 1+ in lower extremities bilaterally and 2+ in upper extremities bilaterally.  ABDOMEN:  Soft, mild tenderness to palpation in the midepigastrium.  No other abdominal tenderness noted.  Bowel sounds were normoactive.  No hepatosplenomegaly.  EXTREMITIES:  No edema, no cyanosis.  Warm.  NEUROLOGIC:  Motor and sensation intact grossly throughout.  LABORATORY DATA:  White  blood cell count 4.7, hemoglobin 10.3, platelets 202. Sodium 143, potassium 3.6, chloride 106, bicarb 26, BUN 14, creatinine 1.0, glucose 101.  Cardiac enzymes, first set:  CK was 73, CK-MB 1.2, troponin I 0.01.  An EKG showed normal sinus rhythm, old T-wave inversions in lead III, aVF, and V1-V3.  No ST changes or Q-waves.  Chest x-ray:  No acute disease, questionable mild cardiomegaly.  ASSESSMENT AND PLAN: 1. Chest pain.  Given patients cardiac  history and the fact that she has had    no chest pain similar to this since her stent placement, we need to rule    out myocardial infarction.  We will also consider gastrointestinal etiology    to this pain, possibly as an outpatient. 2. Diabetes mellitus type 2.  Will look for old hemoglobin A1c.  Her sugar    here is 101.  Will continue diabetic diet and her home medications. 3. Hypertension.  Continue home medications. 4. Hypothyroidism.  Will continue Synthroid at present dose. 5. Hypercholesterolemia.  Continue Pravachol at present dose. DD:  06/23/01 TD:  06/24/01 Job: [id number]

## 2011-05-10 NOTE — Cardiovascular Report (Signed)
[place]. [facility]  Patient:    [name], [name]                    MRN: [id number] Proc. Date: 11/18/00 Adm. Date:  [id number] Attending:  Doneta Public CC:         Estill Batten. Deirdre Priest, M.D.  Noralyn Pick. Eden Emms, M.D. [facility]  CV Laboratory   Cardiac Catheterization  INDICATIONS:  [name] is a 70 year old African-American female who presents with unstable angina/non-Q-wave infarction.  There was borderline troponin positivity.  She underwent diagnostic catheterization by Dr. Eden Emms. There is disease of the left anterior descending, and this is similar to what she had several months earlier.  There is a tight ostial posterior descending stenosis and it was Dr. Ricki Miller that percutaneous intervention would be warranted on this area.  We discussed the case in detail and I discussed this with [name].  PROCEDURES:  Percutaneous angioplasty of the ostium of the posterior descending branch.  DESCRIPTION OF PROCEDURE:  The procedure was performed through an indwelling 6 French sheath placed by Dr. Eden Emms.  Heparin was given according to protocol to prolong the ACT between 200 and 300.  Integrilin was given according to protocol with a double bolus technique.  A JR4 6 [place] guiding catheter was utilized.  A 190 cm .014 traverse wire was used to gain access to the posterior descending branch.  There was a fair amount of tortuosity in the proximal vessel, so we ended up using a 2.25 CrossSail balloon to cross into the PDA.  Several inflations were performed with inflations up to three minutes in duration.  We then passed a 2.5 mm balloon and again several dilatations performed.  Finally we passed a 2.75 mm balloon and took this up to about 6 to 7 atmospheres at which time we kept this up for about two minutes.  There was marked improvement in the appearance of the artery.  This area was not very favorable for stenting, due to the complex  involvement including the AV portion of the right coronary artery.  The stenosis in general was reduced from what appeared to be about 95% down to about 40-50% with a reasonable resulting lumen.  The patient was continued on Integrilin and given Plavix in the lab based on cure trial results.  She was taken to the holding area in satisfactory clinical condition.  HEMODYNAMIC DATA:  Central aortic pressure during the procedure was 140/80. Several doses of intracoronary nitroglycerin were given which dropped the pressure to approximately 110/80.  ANGIOGRAPHIC DATA:  The right coronary artery was a fairly large caliber vessel in the AV groove.  It provided a posterior descending artery of about 2 to 2.5 mm in size which had a 95% ostial stenosis.  There was perhaps 30% narrowing involving the AV portion of the vessel at the takeoff.  Therefore this is a bifurcational plaque.  Following balloon dilatation, this was reduced to about 40% residual luminal narrowing.  There was TIMI-3 flow both at the beginning of the procedure and at the completion of the procedure. There were no major complications.  CONCLUSIONS:  Successful percutaneous angioplasty of the posterior descending branch.  The PDA lesion was opened.  It is ostial and there is the potential for elastic recoil as well as abrupt closure.  It was not optimal for stenting and therefore a stent was not placed at his location at least preliminarily.  We will treat her with aspirin and Plavix.  Continued followup with Dr. Eden Emms will be recommended. DD:  11/18/00 TD:  11/18/00 Job: [id number]

## 2011-05-10 NOTE — Op Note (Signed)
NAME:  [name], [name]                       ACCOUNT NO.:  [id number]   MEDICAL RECORD NO.:  [id number]                   PATIENT TYPE:  OIB   LOCATION:  5707                                 FACILITY:  [facility]   PHYSICIAN:  Douglas A. Magnus Ivan, M.D.           DATE OF BIRTH:  [birth year]   DATE OF PROCEDURE:  08/30/2002  DATE OF DISCHARGE:                                 OPERATIVE REPORT   PREOPERATIVE DIAGNOSES:  Symptomatic cholelithiasis.   POSTOPERATIVE DIAGNOSES:  Symptomatic cholelithiasis.   OPERATION PERFORMED:  Laparoscopic cholecystectomy.   SURGEON:  Douglas A. Magnus Ivan, M.D.   ASSISTANT:  Donnie Coffin. Samuella Cota, M.D.   ANESTHESIA:  General.   ESTIMATED BLOOD LOSS:  Minimal.   DESCRIPTION OF PROCEDURE:  The patient was brought to the operating room and  identified.  She was placed supine on the operating table and general  anesthesia was induced.  Her abdomen was then prepped and draped in the  usual sterile fashion.  Using a #15 blade, a small transverse incision was  made below the umbilicus.  The incision was carried down to the fascia with  the scalpel.  The fascia was then opened with a scalpel in the midline.  Hemostats were then used to pass into the peritoneal cavity.  The 0 Vicryl  pursestring suture was then placed around the fascial opening.  The Hasson  port was placed through the opening and insufflation of the abdomen was  begun.  Next, an 11 mm port was placed in the patient's epigastrium and two  5 mm ports were placed in the patient's right flank under direct vision.  The guidewire was identified and grasped and retracted above the liver bed.  Dissection was then carried out at the base of the gallbladder.  The cystic  artery was found to be anterior and was clipped twice proximally, once  distally and transected with scissors.  The cystic duct was identified and  clipped three times proximally, once distally and transected as well.  __________ vessels  also clipped proximally.  The gallbladder was then easily  dissected free from the liver bed with the electrocautery.  Once the  gallbladder was removed from the liver bed, it was placed in an endo sac and  removed through the incision at the umbilicus.  Hemostasis was achieved in  liver bed.  The 0 Vicryl at the umbilicus was tied in place closing the  fascial defect.  The abdomen was then copiously irrigated with normal  saline.  Again, the liver bed was examined and hemostasis appeared to be  achieved.  All ports were then removed under direct vision and the abdomen  was deflated.  All incisions were then anesthetized with 0.25% Marcaine and  then closed with 4-0 Vicryl subcuticular sutures.  Steri-Strips, gauze and  tape were then applied.  The patient tolerated the procedure well.  All  sponge, needle and instrument counts were correct at the  end of the  procedure.  The patient was then extubated in the operating room and taken  in stable condition to the recovery room.                                               Douglas A. Magnus Ivan, M.D.    DAB/MEDQ  D:  08/30/2002  T:  08/30/2002  Job:  [id number]

## 2011-05-10 NOTE — Discharge Summary (Signed)
[place]. [facility]  Patient:    [name], [name]                    MRN: [id number] Adm. Date:  [id number] Disc. Date: [id number] Attending:  Willow Ora Dictator:   Felipa Eth, M.D. CC:         Gilmore Laroche, M.D.  Patricia Pesa, M.D.   Discharge Summary  PRIMARY CARE PHYSICIAN:  Patricia Pesa, M.D.  DISCHARGE DIAGNOSES: 1. Abdominal pain.  Most likely etiology gastroenteritis versus gastritis. 2. Coronary artery disease, status post percutaneous transluminal coronary    angioplasty in November of 2001.  The patient ruled out for myocardial    infarction. 3. Diabetes mellitus, type 2. 4. Hypertension. 5. Hypothyroidism.  DISCHARGE MEDICATIONS:  1. Protonix 40 mg one tablet p.o. b.i.d.  2. Nitroglycerin sublingual tablets 0.4 mg one tablet sublingual p.r.n. chest     pain.  May repeat x 2 if pain persists and then call primary care     physician.  3. Aspirin one tablet q.d.  4. Avandia 4 mg one tablet q.d.  5. Glucotrol XL 2.5 mg one tablet p.o. q.d.  6. Hydrochlorothiazide 25 mg one tablet p.o. q.d.  7. Imdur 60 mg one tablet p.o. q.d.  8. Metformin 850 mg q.a.m. and 1700 mg q.p.m.  9. Pravachol 20 mg p.o. q.d. 10. Prempro one tablet q.d. 11. Prinivil 20 mg one tablet p.o. q.d. 12. Synthroid 0.075 mg one tablet p.o. q.d.  HISTORY OF PRESENT ILLNESS:  [name] is a 70 year old African-American female who presented on the day of admission secondary to abdominal pain with nausea and vomiting.  The patient stated that earlier on the day of admission, she had started developing severe abdominal pain with nonbloody, nonbilious vomiting.  The patient denied chest pain or shortness of breath.  The patient stated that her abdominal pain was very different that her previous chest pain, which was cardiac in origin.  The patient was admitted to the family practice center.  HOSPITAL COURSE:  The patient was ruled out for an acute  myocardial infarction.  Cardiac enzymes were negative x 3.  EKG was stable throughout admission.  The patient was treated with IV fluids during admission.  An abdominal ultrasound revealed a positive fatty liver, however, no evidence of stones in the gallbladder or ductal systems.  A chest x-ray performed on the day of admission did not reveal any acute disease state or manifestations. The patient was also heme negative during her hospital admission.  On admission, the patients amylase was elevated at 193 and decreased subsequently within 12 hours of admission to 136.  During her admission, abdominal pain improved significantly and she was able to tolerate p.o. 24 hours after admission.  DISPOSITION:  The patient was discharged on December 11, 2000, with significantly improved abdominal pain upon palpation of the abdomen and tolerating p.o. well.  FOLLOW-UP:  The patient is to follow up with Patricia Pesa, M.D., her primary care physician on Thursday, December 18, 2000, at 10:15 a.m.  DISCHARGE MEDICATIONS:  The patient will be discharged on Protonix to see if this might help, especially if it is gastritis in origin.  ACTIVITY:  The patient is to resume her normal activity as tolerated.  DIET:  The patient is to resume her regular diabetic diet, 1800 kilocalories, per day.  WOUND CARE:  Not applicable in this patient.  SPECIAL INSTRUCTIONS:  The patient is to call the primary care physician  for severe abdominal pain, vomiting, or development of chest pain.  Follow up with Patricia Pesa, M.D., at the [facility]. [facility] [place] on Thursday, December 18, 2000, at 10:15 a.m. DD:  12/11/00 TD:  12/12/00 Job: [id number]

## 2011-05-10 NOTE — Discharge Summary (Signed)
[name], [name]                         ACCOUNT NO.:  [id number]   MEDICAL RECORD NO.:  [id number]                   PATIENT TYPE:  INP   LOCATION:                                       FACILITY:  [facility]   PHYSICIAN:  Learta Codding, M.D. [facility]             DATE OF BIRTH:  01-Oct-1941   DATE OF ADMISSION:  03/06/2003  DATE OF DISCHARGE:                                 DISCHARGE SUMMARY   DISCHARGE DIAGNOSES:  1. Non-cardiac chest pain.  2. Stable non-obstructive coronary artery disease by cardiac catheterization     this admission.  3. History of coronary artery disease.     a. Status post myocardial infarction in 2001 with PTCA to the PDA.     b. History of false positive Cardiolite in May of 2003 with non-        obstructive catheterization in the same month (previous PTCA        site and the PDA with 20-30% stenosis).  4. Microcytic anemia with hemoglobin of 9.7 and hematocrit of 29.1 at     discharge.  5. Diabetes mellitus type II.  6. Hypertension.  7. Hyperlipidemia.  8. History of gastritis.  9. 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   PROCEDURE:  Cardiac catheterization by Dr. Charlton Haws on March 07, 2003  revealing left main 20%, LAD 30%, proximal 40%, mid D1 30%, circumflex  normal, IM normal, RCA 43%, osteal PDA, LV normal. Ejection fraction 60% and  no mitral regurgitation. Aortogram no dissection. Renal is normal. No renal  artery stenosis.   HOSPITAL COURSE:  Please see the admission history and physical by Dr. Lewayne Bunting for complete details. Briefly, this 70 year old female with a history  of coronary artery disease status post myocardial infarction in 2000 was  admitted to [name] emergency room with complaints of chest pain  relieved with Nitroglycerin. Her ECG was without acute changes. She was  placed on Nitroglycerin, Heparin and aspirin. Her cardiac enzymes were  negative times three. She went for cardiac  catheterization on March 07, 2003  by Dr. Charlton Haws. The results are as noted above. As noted above, there  was no critical coronary artery disease. Dr. Eden Emms did increased her  Norvasc for better blood pressure control to 10 mg per day. On the morning  of March 08, 2003 she was in stable condition. He felt that the patient  could be discharged to home with close follow-up within the next week for  her microcytic anemia noted on admission labs. Dr. Eden Emms will see her in  six months in follow-up in our office.   LABORATORY DATA:  At discharge on March 08, 2003 WBC count was 3,700.  Hemoglobin 9.7, hematocrit 29.1, MCV 77.9, RDW 16.7, platelet count 149,000.  INR 0.9. Sodium 140, potassium 3.2, chloride 103, CO2 30, glucose 112, BUN  9, creatinine 0.7. Total bilirubin 0.7,  alkaline phosphatase 54, SGOT 31,  SGPT 37, total protein 7.1, albumin 3.8, calcium 9.3, hemoglobin A1C 9.0.  Total cholesterol 125, triglycerides 69, HDL 56, LDL 55. TSH 0.349. Iron 79.   DIAGNOSTIC IMPRESSION:  Admission chest x-ray revealed no acute  cardiopulmonary findings.   DISCHARGE MEDICATIONS:  1. Norvasc 10 mg daily.  2. Synthroid 0.075 mg daily.  3. Zocor 40 mg daily.  4. Lisinopril/HCTZ 20/12.5 mg daily.  5. Aspirin 81 mg daily.  6. Glipizide 10 mg twice a day.  7. Atenolol 100 mg daily.  8. Zandia 4 mg daily.  9. Metformin 1 gram twice a day to be restarted on Thursday, March 10, 2003.  10.      Imdur 60 mg per day.  11.      Nitroglycerin as needed chest pain.   DISPOSITION:  The patient is to call with any recurrent chest pain or go to  the emergency room as soon as possible. The patient is to call if there is  any concerns over bleeding, swelling, or bruising.   ACTIVITY:  No driving, heavy lifting, or exertion.   DIET:  Low fat, low sodium, diabetic diet.   FOLLOW UP:  She is to see Dr. Katrinka Blazing at the [facility] within the next one week for her microcytic  anemia. At that  time, she should also have a repeat BMP drawn. She will be given some  potassium replacement prior to discharge today. The patient should see Dr.  Eden Emms in six months. The office will call her with an appointment.      Tereso Newcomer, P.A.                        Learta Codding, M.D. [facility]    SW/MEDQ  D:  03/08/2003  T:  03/08/2003  Job:  [id number]   cc:   Charlton Haws, M.D. [facility]

## 2011-05-10 NOTE — Assessment & Plan Note (Signed)
[facility]                            CARDIOLOGY OFFICE NOTE   NAME:Drewry, HEIDEMARIE GOODNOW                    MRN:          [id number]  DATE:04/01/2007                            DOB:          1941/10/01    Mckinnley returns today for followup.  She is a diabetic who has had  angioplasty to the PDA in the past.  She has not had a Myoview in about  four years.  She has some occasional exertional dyspnea but no chest  pain.  She has been compliant with her meds.  A 10-point review of  systems is negative except for some peripheral neuropathy in her feet.   She is currently taking Metformin 1 gm b.i.d., atenolol 100, Norvasc 10  a day, lisinopril 20/12.5, glipizide 10 b.i.d., an aspirin a day, Imdur  60 a day, Zocor 40 a day, Lantus as directed.   PHYSICAL EXAMINATION:  VITAL SIGNS:  Her weight is stable.  Blood  pressure is 130/70.  Pulse is 70 and regular.  HEENT:  Normal.  NECK:  Carotids are normal without bruits.  LUNGS:  Clear.  HEART:  There is an S1 and S2 with normal heart sounds.  ABDOMEN:  Benign.  EXTREMITIES:  Intact pulses.  No edema.   IMPRESSION:  Coronary artery disease, history of posterior descending  artery angioplasty.  Follow-up Adenosine Myoview.  The patient prefers  to have a chemical.  Usually, she gets musculoskeletal pain when she  walks.  We will continue her current medications.  Her blood pressure is  well controlled on Norvasc and lisinopril.  Her last LDL cholesterol was  under 100.   The patient needs a little better control of her hemoglobin A1C, as they  tend to run in the mid 7s, so she will follow up with her primary care  doctor for this.   As long as her Myoview is not high risk, I will continue to treat her  medically.   Her LFTs were normal when we last checked them at the 2007 in regards to  her Zocor.  Her EKG today showed sinus rhythm with nonspecific ST-T wave  changes, particularly in the inferolateral  leads.     Noralyn Pick. Eden Emms, MD, [facility]  Electronically Signed    PCN/MedQ  DD: 04/01/2007  DT: 04/02/2007  Job #: [id number]

## 2011-05-10 NOTE — H&P (Signed)
NAME:  [name], [name]                       ACCOUNT NO.:  [id number]   MEDICAL RECORD NO.:  [id number]                   PATIENT TYPE:  INP   LOCATION:  3743                                 FACILITY:  [facility]   PHYSICIAN:  Learta Codding, M.D. [facility]             DATE OF BIRTH:  August 18, 1941   DATE OF ADMISSION:  03/06/2003  DATE OF DISCHARGE:                                HISTORY & PHYSICAL   CHIEF COMPLAINT:  Chest pain that started earlier today.   HISTORY OF PRESENT ILLNESS:  The patient is a 70 year old female with  history of coronary artery disease, status post last catheterization in May  of 2003 after a false positive Cardiolite study. The patient had been doing  well since her last catheterization with no shortness of breath or  substernal chest pain. However, earlier today around 4:30 a.m. this morning  she woke up with severe substernal chest pain which she rated a 5 over 10.  She refers the pain to the left chest and describes it as a real tightness  with some radiation to the shoulder. The patient also had some associated  shortness of breath. Interestingly, there is some pleuritic component to  this chest pain. The pain lasted until 10:20 a.m. when she took a  Nitroglycerin with some relief. Subsequently, she came to the emergency room  because of ongoing chest pain. On admission to the emergency room, EKG did  not demonstrate electrocardiographic changes. She was started on IV  Nitroglycerin with resolution of her chest pain.   ALLERGIES:  SEPTRA ( causes GI upset).   MEDICATIONS:  Synthroid 75 mcg by mouth each day, Zocor 80 mg per day,  Lisinopril/HCTZ, Norvasc 5 mg by mouth each day, aspirin 81 mg per day,  Glipizide 10 mg by mouth twice a day, Atenolol 100 mg by mouth each day,  Imdur 60 mg by mouth each day, Metformin 100 mg by mouth twice a day,  Avandia 4 mg by mouth each day, Nitroglycerin as needed.   PAST MEDICAL HISTORY:  Last catheterization in May of  2003, left main 20,  LAD 30, circumflex no focal lesions, RCA no lesions and PDA is 20-30%  stenosis at the prior PTCA site. False positive Cardiolite prior to the  above catheterization. In 2001 PTCA to the PDA. History of diabetes mellitus  on oral hypoglycemics. History of hypertension, history of hyperlipidemia,  history of hypothyroidism, and history of gastritis.   SOCIAL HISTORY:  The patient lives in [place] by herself. She is a  Advertising copywriter at [employer]. She denies alcohol or tobacco use.   FAMILY HISTORY:  Notable for coronary artery disease in one sibling. Both  brother and sister had myocardial infarction in their 102's.   REVIEW OF SYSTEMS:  No headache or sore throat. Positive for chest pain. No  dyspnea on exertion, paroxysmal nocturnal dyspnea or palpitations. No  frequency or dysuria. Positive for arthralgias. No  nausea and vomiting.   PHYSICAL EXAMINATION:  VITAL SIGNS: Blood pressure 161/98, heart rate is 66  beats per minute. Sat is 99% on room air. Temperature 99.3.  HEENT: Pupils are equal, round, and reactive to light and accommodation.  Extraocular muscles intact.  NECK: Supple. No definite bruit. No jugular venous distention.  HEART: Regular rate and rhythm. Normal sinus. A II/VI systolic murmur at the  left upper sternal border.  LUNGS: Clear.  ABDOMEN: Soft and nontender.  GU/RECTAL: Deferred.  EXTREMITIES: No clubbing, cyanosis, or edema.  NEURO: The patient is alert and oriented. Grossly non-focal.   DIAGNOSTIC IMPRESSION:  Chest x-ray revealed no abnormalities. EKG normal  sinus rhythm. No specific T wave changes. Repolarization, probably normal  variant.   LABORATORY DATA:  Hemoglobin 10.6, white count 4.4, platelet count 186,000.  MCV 78.7. Sodium 140, potassium 3.5, BUN 12, creatinine 1.1, CK MB negative,  troponin 0.01.   IMPRESSION:  1. Substernal chest pain both with typical and atypical features. The     patient does report that her chest pain  is similar to her pain that     presented several years ago when she had a myocardial infarction. Her     pain symptoms also improved with Nitroglycerin. However, she has a clear     pleuritic component in addition. She has no specific EKG changes. Given     her prior false positive Cardiolite study, I would favor to proceed with     a cardiac catheterization. If this study is negative, an     echocardiographic study can be obtained to rule out that she does have     pericardial effusion in the setting of pericarditis.  2. Diabetes mellitus. Glucophage at home.  3. Anemia. The patient has had chronic anemia. We looked back at her blood     counts from 2001 and her hemoglobin has been running between 9 and 10.     Given her low MCV, I suspect that the patient has thalassemia but we will     obtain iron studies to make sure that she does not have chronic iron     deficiency.  4. Hypertension. Norvasc is increased to 10 mg per day.   PLAN:  Proceed to cardiac catheterization in the morning.                                               Learta Codding, M.D. [facility]    GED/MEDQ  D:  03/06/2003  T:  03/06/2003  Job:  [id number]   cc:   Charlton Haws, M.D. [facility]

## 2011-05-10 NOTE — Cardiovascular Report (Signed)
[place]. [facility]  Patient:    [name], [name]                    MRN: [id number] Proc. Date: 11/18/00 Adm. Date:  [id number] Attending:  Doneta Public CC:         Cardiac Catheterization Laboratory  Dr. ______, Family Practice   Cardiac Catheterization  PROCEDURE PERFORMED:  Coronary arteriography.  INDICATIONS:  Previous 50% lesion in LAD with recurrent angina.  CORONARY ARTERIOGRAPHY:  Left main coronary artery:  The left main coronary artery had a 20% discrete stenosis.  Left anterior descending artery:  The left anterior descending artery had a calcified 50% lesion in the mid vessel.  This was unchanged from May of 2000.  Circumflex coronary artery:  The circumflex coronary artery was normal.  Right coronary artery:  The right coronary artery had 30% multiple discrete lesion in its proximal and midportion.  The PDA had a 90% ostial stenosis.  RIGHT ANTERIOR OBLIQUE VENTRICULOGRAPHY:  The RAO ventriculography revealed normal LV function with ejection fraction in excess of 80%.  There is no gradient across the aortic valve and no MR.  LV pressure while on nitroglycerin was 160/13.  Aortic pressure was 160/67.  IMPRESSION:  Films were reviewed with Dr. [name].  We will proceed with angioplasty of the posterior descending artery.  Since it is an ostial lesion I do not think that it will involve stenting. DD:  11/18/00 TD:  11/18/00 Job: [id number]

## 2011-05-10 NOTE — H&P (Signed)
[place]. [facility]  Patient:    [name], [name]                    MRN: [id number] Adm. Date:  [id number] Attending:  Willow Ora Dictator:   Doren Custard, M.D.                         History and Physical  PROBLEMS: 1. Abdominal pain/nausea/vomiting. 2. Coronary artery disease. 3. Diabetes mellitus type 2. 4. Hypercholesterolemia. 5. Hypertension. 6. Hypothyroidism. 7. Mild dehydration.  HISTORY OF PRESENT ILLNESS:  The patient is a 70 year old female with the above past medical history who presents to the emergency room secondary to an acute onset of abdominal pain, nausea, and vomiting at approximately 10 p.m. this evening.  The patient describes the pain as mostly epigastric and right upper quadrant.  Denies any unusual food intake.  Emesis is non-bilious and non-bloody and mostly consists of food particles.  Denies any sick contacts. Denies any diarrhea.  REVIEW OF SYSTEMS:  Denies fevers, weight loss, or weight gain.  Last bowel movement was yesterday, and it was normal.  Denies any melena or hematochezia. Denies any chest pain or shortness of breath.  Denies any numbness, tingling, or weakness.  Denies any vaginal bleeding.  Denies any urinary symptoms. Denies any problems with hyper or hypoglycemia.  Most recently saw Dr. Lance Bosch last week and has had no recent change in her medications.  PAST MEDICAL HISTORY:  As above.  CURRENT MEDICATIONS:  (Per mini medical record.  The patient does not have her medication list with her and is unsure of these.)  1. Aspirin one q.d.  2. Atenolol 50 mg q.d.  3. Avandia 4 mg q.d.  4. Glucotrol XL 2.5 mg p.o. q.d.  5. HCTZ 25 q.d.  6. Imdur 60 q.d.  7. Metformin 850 q.a.m. and 1700 q.p.m.  8. Multivitamin one q.d.  9. Nitroglycerin 0.4 sublingual p.r.n. #10. 10. Pravachol 20 q.d. 11. Prempro one q.d. 12. Prinivil 20 q.d. 13. Synthroid 0.075 mg p.o. q.d.  ALLERGIES:  SEPTRA:  GI  intolerance.  SOCIAL HISTORY:  The patient is a nonsmoker, lives here in [place] and has six living children.  PAST SURGICAL/INTERVENTION HISTORY:  The patient had a recent cardiac catheterization in November 2001 with 50% LAD and PDA which was reduced from 90% to 30%.  EF was 60%.  She had a history of a BTL and surgery on a thyroid cancer.  FAMILY HISTORY:  Diabetes and hypertension in the family.  Father died of unknown cause.  Mother died in hypoglycemic status.  No breast or colon cancer.  She had a brother and sister who died of MI in their 71s.  PHYSICAL EXAMINATION:  VITAL SIGNS:  Blood pressure is 170/80, temperature 99.1, respiratory rate 20, and saturating 100% on room air.  GENERAL:  This is a nontoxic female seen in mild distress who is initially when we came into the room.  She is alert and oriented times four and appropriately responsive but clearly does not feel very good.  HEENT:  Sclerae are nonicteric.  Mucous membranes are mildly dry.  She does have muddy sclerae.  Pupils are equal, round and reactive to light and accommodation.  Extraocular muscles are intact.  NECK:  Supple.  She has got no lymphadenopathy.  She has a well-healed surgical scar in the area of clavicular notch.  LUNGS:  Clear to auscultation bilaterally.  She is moving air well.  CARDIOVASCULAR:  Regular rate and rhythm in the high 90s with no murmur.  ABDOMEN:  Soft and nondistended.  She is tender to palpation in the epigastrium by upper quadrant region.  She has got a mildly positive Murphys sign.  BACK:  No CVA tenderness.  She has got no suprapubic tenderness.  She has normal bowel sounds.  No rebound.  No guarding.  EXTREMITIES:  No clubbing, cyanosis, or edema.  RECTAL:  External hemorrhoids, but she is heme-negative.  She does not have any stool in the vault.  DIAGNOSTIC STUDIES:  ECG shows persistent T-wave invasions in lead 3 and the lateral lead related to an admission  ECG of November 18, 2000.  Her post PTCA ECG on November 19, 2000 showed an upright T in V3, and this T is now inverted.  Chest x-ray shows no acute disease.  Amylase is 193, lipase 43, CK 57, MB 1.2, sodium 136, potassium 3.4, chloride 98, bicarbonate 29, BUN 14, creatinine 0.7, glucose 155, calcium 10.4, total protein 7.4, albumin 3.9.  AST 24, ALT 29, alkaline phosphatase 42. Hemoglobin 11.2 and white count is 6.5.  ASSESSMENT AND PLAN:  This is a 70 year old African-American female with diabetes mellitus, coronary artery disease, and in today with abdominal pain, nausea, and vomiting. 1. Abdominal pain, nausea, and vomiting.  No clear etiology at this point.    However, most likely causes include cholelithiasis/cholecystitis versus    gastroenteritis versus peptic ulcer disease versus an atypical cardiogenic    presentation in a diabetic patient.  I will check an ultrasound of the    abdomen, rule out for myocardial infarction, and treat her nausea and    abdominal pain symptomatically.  In addition, we will give a Proton pump    inhibitor today and recheck CMET, CBC, and amylase in the a.m. 2. Diabetes mellitus type 2.  Hold her oral hypoglycemics for now.  Check    CBGs q.a.c. and h.s.  Start sliding scale insulin. 3. Hypertension.  We will give metoprolol now and restart her hypertensive    medications in the morning.  We will clarify exactly what these are. 4. Hypothyroidism.  Check TSH. 5. Mild hypokalemia.  Replace her potassium and her fluids. 6. Mild electrocardiogram change.  Rule out for myocardial infarction with    cardiac enzymes.  Repeat EKG in the morning and receive O2 and metoprolol    and aspirin here prior to going to the floor.  Admit to telemetry bed. DD:  12/10/00 TD:  12/10/00 Job: [id number]

## 2011-05-10 NOTE — Discharge Summary (Signed)
[place]. [facility]  Patient:    [name], [name]                    MRN: [id number] Adm. Date:  [id number] Disc. Date: [id number] Attending:  Willow Ora Dictator:   Juanell Fairly, M.D. CC:         Raynelle Jan, M.D.   Discharge Summary  DATE OF BIRTH:  [birth year]  DISCHARGE MEDICATIONS:  1. Aspirin 325 mg q.d.  2. Atenolol 50 mg p.o. q.d.  3. Avandia 4 mg p.o. q.d.  4. Glucotrol 5 mg p.o. q.d.  5. Hctz 25 mg p.o. q.d.  6. Imdur 60 mg p.o. q.d.  7. Metformin 850 mg p.o. t.i.d.  8. Multivitamin p.o. q.d.  9. Nitroglycerin 0.4 sublingual p.r.n. x 3. 10. Pravachol 20 mg p.o. q.d. 11. Prempro one tablet p.o. q.d. 12. Prinivil 20 mg p.o. q.d. 13. Protonix 40 mg p.o. q.d. 14. Synthroid 0.075 mg p.o. q.d. 15. Zantac 150 mg p.o. b.i.d.  DISCHARGE DIET:  The patient was discharged on an ADA diet.  FOLLOWUP:  Follow in clinic with Dr. Carolyne Fiscal on July 16.  HOSPITAL COURSE:  The patient is a 70 year old female who presented to the emergency with a chief complaint of chest pain.  The patients pain began at 1 p.m., but she thought it was indigestion, at 2 p.m. it had not subsided so she took nitroglycerin sublingual.  After three nitroglycerins with minimal improvement the patient came to the ER.  The patient described the pain as a burning epigastric retrosternal pain which radiated to the back.  Other than mild improvement with nitroglycerin, there were no alleviating or exacerbating factors.  The pain was nonexertional.  The pain is associated with nausea, but no vomiting.  There was no diaphoresis, no dizziness, no bowel or urinary complaints, no shortness of breath.  The patient has a history of non-Q-wave MI in November 2001.  The patient was cathed in November 2001 and angioplasty was done of the posterior descending artery.  The patient had a cardiac stress test approximately two weeks ago which was normal.  The patient is a  nonsmoker and does not drink.  Other than noted above, review of systems is unremarkable.  PHYSICAL EXAMINATION:  Remarkable for reproduction of pain on soft and deep palpation of the epigastrium.  The patient received an EKG which showed normal sinus rhythm, T-wave inversions in 3 AVF, V1 through V3, which were present on previous EKGs and no ST changes.  Chest x-ray showed mild cardiomegaly. Cardiac enzymes were drawn, CKs being drawn at eight hour intervals beginning at the time of admission and troponin-I being drawn at 12 hour intervals at the time of admission, each with a total of 3 draws.  CKs being 73, 55 and 60 and CK-MBs being 1.2, less than 0.3 and 0.8.  Troponin-Is being 0.01, 0.01 and 0.02.  In addition, the patient was given a GI cocktail which relieved her symptoms.  The patient was discharged on July 3 with the final diagnosis of gastritis and the addition of Zantac 150 mg b.i.d. to her medication regimen. D:  06/25/01 TD:  06/25/01 Job: [id number]

## 2011-05-10 NOTE — Discharge Summary (Signed)
[place]. [facility]  Patient:    [name], [name]                    MRN: [id number] Adm. Date:  [id number] Disc. Date: [id number] Attending:  Willow Ora Dictator:   Pricilla Holm, M.D. CC:         Patricia Pesa, M.D.   Discharge Summary  PRIMARY PHYSICIAN:  Patricia Pesa, M.D. at [facility]  CONSULTS:  Arturo Morton. [name], M.D., cardiology  PROCEDURES:  Cardiac catheterization which revealed a PDA with 90% ostial stenosis.  PCI 90% decreased to less than 40% with angioplasty.  No stent placed.  DIAGNOSES: 1. Unstable angina. 2. NonQ wave myocardial infarction. 3. Diabetes mellitus. 4. Hypothyroidism.  DISCHARGE MEDICATIONS:  1. Atenolol 50 mg one tab p.o. q.d.  2. Glucotrol XL 2.5 mg one tab p.o. q.d.  3. Avandia 4 mg one tab p.o. q.d.  4. Synthroid 75 mcg one tab p.o. q.d.  5. Pravachol 20 mg one tab p.o. q.d.  6. Protonix 40 mg one tab p.o. q.d.  7. Prinivil 20 mg one tab p.o. q.d.  8. Hydrochlorothiazide 12.5 mg one tab p.o. q.d.  9. Glucophage 850 mg one tab q.a.m. and two tabs q.p.m. 10. Aspirin 325 mg one tab p.o. q.d. 11. Plavix 75 mg one tab p.o. q.d. 12. Imdur 60 mg one tab p.o. q.d.  SUMMARY: [name] is a 70 year old African-American female, patient of Dr. Patricia Pesa at [facility] who presented with unstable angina and nonQ wave infarction.  There appeared to be borderline Troponin elevation. She underwent diagnostic catheterization by Dr. Eden Emms.  Disease of the left anterior descending similar to what she had several months earlier.  There was also tight ostial posterior descending stenosis.  The patient then underwent percutaneous angioplasty of the posterior descending branch.  HOSPITAL COURSE:  #1 - CARDIAC:  The patient tolerated the angioplasty and PCA of her posterior descending artery without complications.  No stent was placed.  The patient was started on aspirin, Plavix and  Aggrastat for 18 hours.  She was continued on her beta blocker, Imdur, Lisinopril, Hydrochlorothiazide.  Aspirin was started as well as the Plavix and Aggrastat.  The patient was found to be a good candidate for cardiac rehab.  The patient will continue to follow up with Dr. Eden Emms.  #2 - DIABETES MELLITUS:  The patients capillary blood glucose remained fairly well controlled during her hospitalization.  She will continue with her diabetic diet.  She will also continue on her Glucotrol and Avandia.  #3 - THYROID:  The patient was continued on her Synthroid and had no other problems.  #4 - HYPERCHOLESTEROLEMIA:  The patient was originally on Zocor and was switched to Pravachol.  Her cholesterol was 154.  Triglycerides 107. HDL 40, LDL 93.  CONDITION ON DISCHARGE:  Good.  DISPOSITION:  Discharged to home.  MEDICATIONS:  As per above.  INSTRUCTIONS:  The patient was instructed not to return to work until after follow up appointment with Dr. Lance Bosch.  In terms of her diet, she was to continue following a low sodium, low salt, diabetic diet.  WOUND CARE:  She is to keep her femoral catheterization site clean and dry.  She was to continue exercising as instructed by cardiac rehab.  She was to call her doctor or return to the hospital if she had severe chest pain.  FOLLOW-UP: The patient will follow up with Dr. Patricia Pesa  at [facility] Wednesday, November 26, 2000 at 1:50 p.m.  Cardiology will arrange follow up appointment in three weeks as well as well as three months. The patient will follow up with Dr. Eden Emms on December 08, 2001 at 11 a.m. She will also follow up three months later with him as well.  The patient voiced good understanding of the above discharge plan and had no further questions. DD:  03/25/01 TD:  03/26/01 Job: [id number]

## 2011-05-10 NOTE — Cardiovascular Report (Signed)
NAME:  [name], [name]                       ACCOUNT NO.:  [id number]   MEDICAL RECORD NO.:  [id number]                   PATIENT TYPE:  INP   LOCATION:  3743                                 FACILITY:  [facility]   PHYSICIAN:  Charlton Haws, M.D. [facility]              DATE OF BIRTH:  [birth year]   DATE OF PROCEDURE:  DATE OF DISCHARGE:                              CARDIAC CATHETERIZATION   PROCEDURE:  Coronary arteriography.   INDICATIONS FOR PROCEDURE:  Recurrent chest pain, history of angioplasty of  the ostium of the PDA, multiple coronary risk factors including  hypercholesterolemia, hypertension, and diabetes.   PROCEDURAL NOTE:  After the patient was found not to have critical coronary  disease, an aortic root injection was done to assess aortic pathology and  rule out aortic dissection, aneurysm, or ulcer.   Because of the patient's significant hypertension, injection of the distal  aorta for renal aortography was also performed.   Standard catheterization was done from the right femoral artery.  The right  femoral artery was somewhat difficult to palpate, and the Smart needle was  used to cannulate the artery.   FINDINGS:  1. The left main coronary artery had a 20% discrete stenosis.  2. The left anterior descending artery had 30% to 40% multiple discrete     lesions in the proximal and mid portion.  The distal vessel was normal     sized, diseased.  The first diagonal branch had a 30% tubular lesion.  3. The circumflex coronary artery was normal.  The intermedius branch was     normal.  4. The right coronary artery had 40% to 50% eccentric disease at the takeoff     of the PDA.  This was not thought to be critical.  The remainder of the     right coronary artery was pristine.  5. Aortic pressure was 176/81.  6. RAO ventriculography showed normal LV function with an EF of 60%.  There     were no wall motion abnormalities.  7. There was no MR.  8. LAO projection of the aortic  root showed no evidence of aneurysm, ulcer,     or dissection.  9. Injection into distal aorta showed wide open renal arteries with no     evidence of stenosis.   IMPRESSION:  The patient has no critical coronary disease.  She will return  to Dr. Katrinka Blazing for her primary care followup as well as care of her diabetes  and hypertension.   Will increase her Norvasc to 10 mg a day.  She will be discharged in the  morning if her leg heals well.  I will keep her nitroglycerin going until  after her sheath is pulled to minimize the risk of hematoma and bleed.  Charlton Haws, M.D. [facility]    PN/MEDQ  D:  03/07/2003  T:  03/07/2003  Job:  [id number]   cc:   Dr. Larrie Kass [facility]

## 2011-05-16 ENCOUNTER — Other Ambulatory Visit: Payer: Self-pay | Admitting: Sports Medicine

## 2011-05-16 NOTE — Telephone Encounter (Signed)
Refill request

## 2011-08-14 ENCOUNTER — Other Ambulatory Visit: Payer: Self-pay | Admitting: Family Medicine

## 2011-08-14 MED ORDER — HYDROCHLOROTHIAZIDE 25 MG PO TABS: 25.0000 mg | ORAL_TABLET | Freq: Every day | ORAL | Status: DC

## 2011-08-14 MED ORDER — CARVEDILOL 25 MG PO TABS: 25.0000 mg | ORAL_TABLET | Freq: Two times a day (BID) | ORAL | Status: DC

## 2011-08-21 ENCOUNTER — Ambulatory Visit (INDEPENDENT_AMBULATORY_CARE_PROVIDER_SITE_OTHER): Payer: Medicare Other | Admitting: Family Medicine

## 2011-08-21 ENCOUNTER — Encounter: Payer: Self-pay | Admitting: Family Medicine

## 2011-08-21 VITALS — BP 162/68 | HR 70 | Temp 98.9°F | Ht 62.5 in | Wt 168.0 lb

## 2011-08-21 DIAGNOSIS — D229 Melanocytic nevi, unspecified: Secondary | ICD-10-CM | POA: Insufficient documentation

## 2011-08-21 DIAGNOSIS — D239 Other benign neoplasm of skin, unspecified: Secondary | ICD-10-CM

## 2011-08-21 MED ORDER — NITROGLYCERIN 0.4 MG SL SUBL: 0.4000 mg | SUBLINGUAL_TABLET | SUBLINGUAL | Status: DC | PRN

## 2011-08-21 NOTE — Progress Notes (Signed)
Addended by: [name] on: 08/21/2011 10:40 AM   Modules accepted: Orders

## 2011-08-21 NOTE — Progress Notes (Signed)
  Subjective:    Patient ID: [name], female    DOB: 04-11-1941, 70 y.o.   MRN: [id number]  HPI 1. Lesions on left labia Recent presentation. Small cystic structure left labia majora. No pain. No fever. No discharge. No adenopathy. No sexual activity.  2nd smaller skin lesion near left labia majora. Appears like healing pimple.  2. 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 It is irregular with color variation. No change per pt.    Review of Systems  Constitutional: Negative for fever and unexpected weight change.  Genitourinary: Negative for vaginal bleeding, vaginal discharge, genital sores, vaginal pain and pelvic pain.  Skin: Negative for rash.  Hematological: Negative for adenopathy.       Objective:   Physical Exam  Nursing note and vitals reviewed. Constitutional: She appears well-developed and well-nourished. No distress.       obese  Genitourinary: Vagina normal.    There is lesion on the left labia. There is no rash or tenderness on the left labia.       Assessment & Plan:  1. Lesions on left labia -small cystic structure, benign appearing. Will follow for now. asymptomatic  2. Atypical Nevus right shoulder - prophylactic cryoablation of right shoulder nevus with atypical features.

## 2011-09-04 ENCOUNTER — Encounter: Payer: Self-pay | Admitting: Family Medicine

## 2011-09-04 ENCOUNTER — Ambulatory Visit (INDEPENDENT_AMBULATORY_CARE_PROVIDER_SITE_OTHER): Payer: Medicare Other | Admitting: Family Medicine

## 2011-09-04 VITALS — BP 135/64 | HR 75 | Temp 98.7°F | Ht 62.6 in | Wt 166.0 lb

## 2011-09-04 DIAGNOSIS — Z23 Encounter for immunization: Secondary | ICD-10-CM

## 2011-09-04 DIAGNOSIS — E119 Type 2 diabetes mellitus without complications: Secondary | ICD-10-CM

## 2011-09-04 LAB — POCT GLYCOSYLATED HEMOGLOBIN (HGB A1C): Hemoglobin A1C: 7.5

## 2011-09-04 NOTE — Progress Notes (Signed)
Addended by: Deno Etienne on: 09/04/2011 11:48 AM   Modules accepted: Orders

## 2011-09-04 NOTE — Progress Notes (Signed)
  Subjective:    Patient ID: [name], female    DOB: 11/27/1941, 70 y.o.   MRN: [id number] HPI Review of Systems    Objective:   Physical Exam  Subjective:    Patient ID: [name], female    DOB: 07-09-1941, 70 y.o.   MRN: [id number]  HPI 1. 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 It is irregular with color variation. No change per pt.    Review of Systems  Constitutional: Negative for fever and unexpected weight change.  Skin: Negative for rash.  Hematological: Negative for adenopathy.     Objective:   Physical Exam  Nursing note and vitals reviewed. Constitutional: She appears well-developed and well-nourished. No distress.  Right Shoulder: small nevus, smaller than last visit    Assessment & Plan:  1. Atypical Nevus right shoulder -Further cryoablation of right shoulder nevus. May take several freezing episodes.

## 2011-09-19 ENCOUNTER — Other Ambulatory Visit: Payer: Self-pay | Admitting: Family Medicine

## 2011-09-19 DIAGNOSIS — Z1231 Encounter for screening mammogram for malignant neoplasm of breast: Secondary | ICD-10-CM

## 2011-09-23 ENCOUNTER — Encounter: Payer: Self-pay | Admitting: Family Medicine

## 2011-09-23 ENCOUNTER — Ambulatory Visit (INDEPENDENT_AMBULATORY_CARE_PROVIDER_SITE_OTHER): Payer: Medicare Other | Admitting: Family Medicine

## 2011-09-23 DIAGNOSIS — D239 Other benign neoplasm of skin, unspecified: Secondary | ICD-10-CM

## 2011-09-23 DIAGNOSIS — D229 Melanocytic nevi, unspecified: Secondary | ICD-10-CM

## 2011-09-23 DIAGNOSIS — L723 Sebaceous cyst: Secondary | ICD-10-CM

## 2011-09-23 NOTE — Assessment & Plan Note (Signed)
Completely cryo-ablated. Will follow

## 2011-09-23 NOTE — Patient Instructions (Signed)
Please come back and see me for removal of sebaceous cyst PRN

## 2011-09-23 NOTE — Assessment & Plan Note (Signed)
Will follow Excision PRN

## 2011-09-23 NOTE — Progress Notes (Signed)
  Subjective:    Patient ID: [name], female    DOB: April 02, 1941, 70 y.o.   MRN: [id number]  HPI 1. Evaluation of cryo-ablated mole Mole completely ablated at this time. Remaining area is healing well.  2. Cystic structure back Patient has a 1x1 cm cystic structure, that appears like a sebbaceous cyst. She has no discharge, she says it bothers her and gets hard. No fevers, no rash, no joint pain.   Review of Systems  Constitutional: Negative for fever.  Musculoskeletal: Negative for joint swelling and arthralgias.  Skin: Negative for rash.       Objective:   Physical Exam  Nursing note and vitals reviewed. Skin:       Mid upper back, 1x1 cm, nontender cystic structure.      Assessment & Plan:

## 2011-09-27 LAB — COMPREHENSIVE METABOLIC PANEL
Albumin: 3.7
Alkaline Phosphatase: 60
BUN: 15
Calcium: 10.1
Potassium: 3.7
Total Protein: 6.9

## 2011-09-27 LAB — DIFFERENTIAL
Basophils Relative: 1
Lymphocytes Relative: 50 — ABNORMAL HIGH
Lymphs Abs: 2.8
Monocytes Absolute: 0.5
Monocytes Relative: 9
Neutro Abs: 2.1
Neutrophils Relative %: 37 — ABNORMAL LOW

## 2011-09-27 LAB — POCT CARDIAC MARKERS
CKMB, poc: <1 — ABNORMAL LOW
Myoglobin, poc: 45.3
Myoglobin, poc: 48.7
Operator id: 270651
Troponin i, poc: <0.05
Troponin i, poc: <0.05

## 2011-09-27 LAB — CBC
HCT: 31.9 — ABNORMAL LOW
MCHC: 33.5
Platelets: 199
RDW: 15.5

## 2011-10-17 ENCOUNTER — Ambulatory Visit
Admission: RE | Admit: 2011-10-17 | Discharge: 2011-10-17 | Disposition: A | Discharge status: Home / Self Care | Payer: Medicare Other | Source: Ambulatory Visit | Attending: Family Medicine | Admitting: Family Medicine

## 2011-10-17 DIAGNOSIS — Z1231 Encounter for screening mammogram for malignant neoplasm of breast: Secondary | ICD-10-CM

## 2011-11-22 IMAGING — CR DG FOOT COMPLETE 3+V*L*
3 series · 3 of 3 positions shown · non-contrast
Comparison: Left foot radiographs performed 02/06/2011

CLINICAL DATA: Dropped heavy object on left foot; dorsal left foot
pain.

LEFT FOOT - COMPLETE 3+ VIEW

[t foot ap left *]
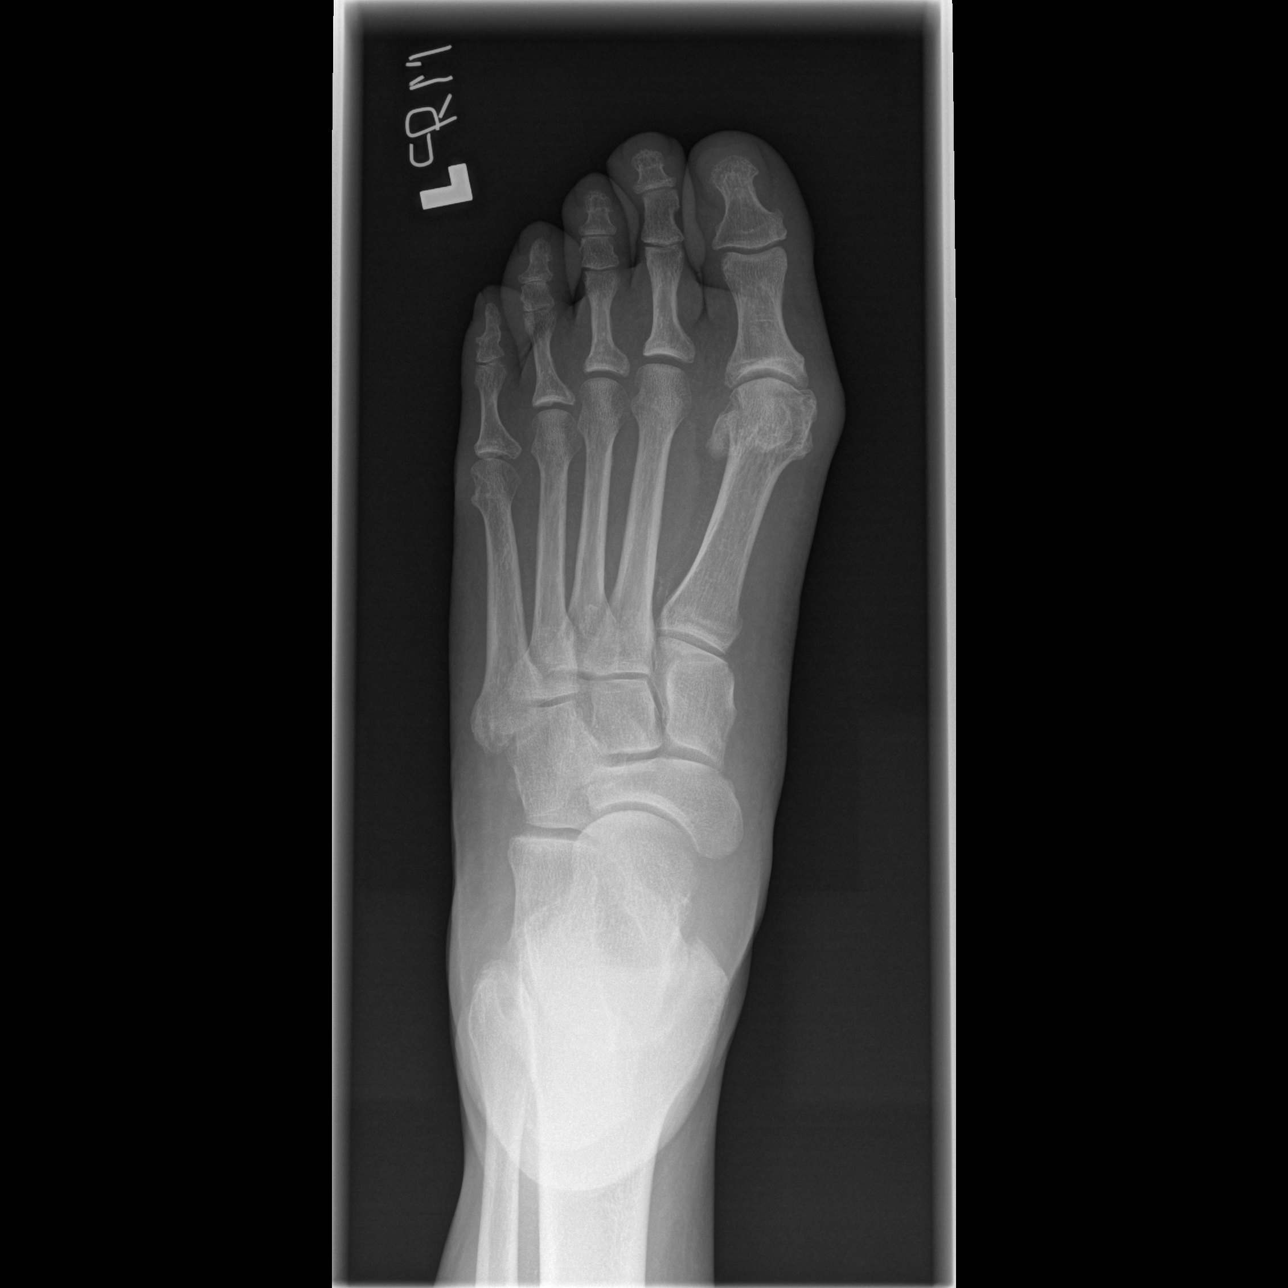

[t foot oblique left *]
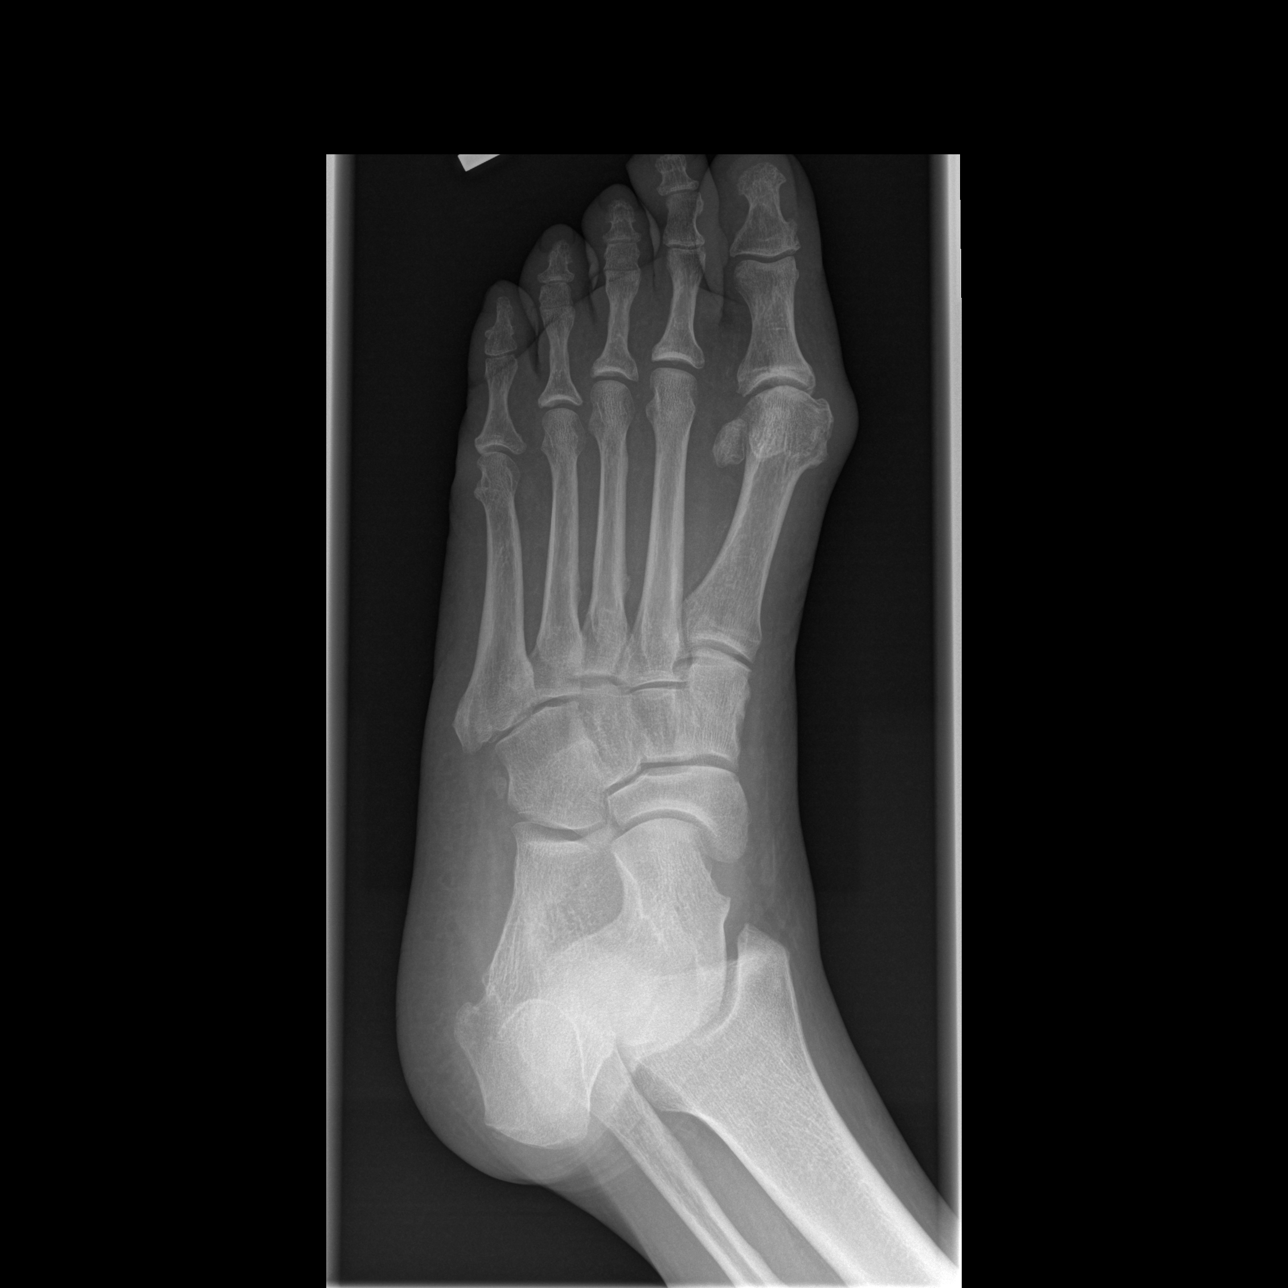

[t foot lat left *]
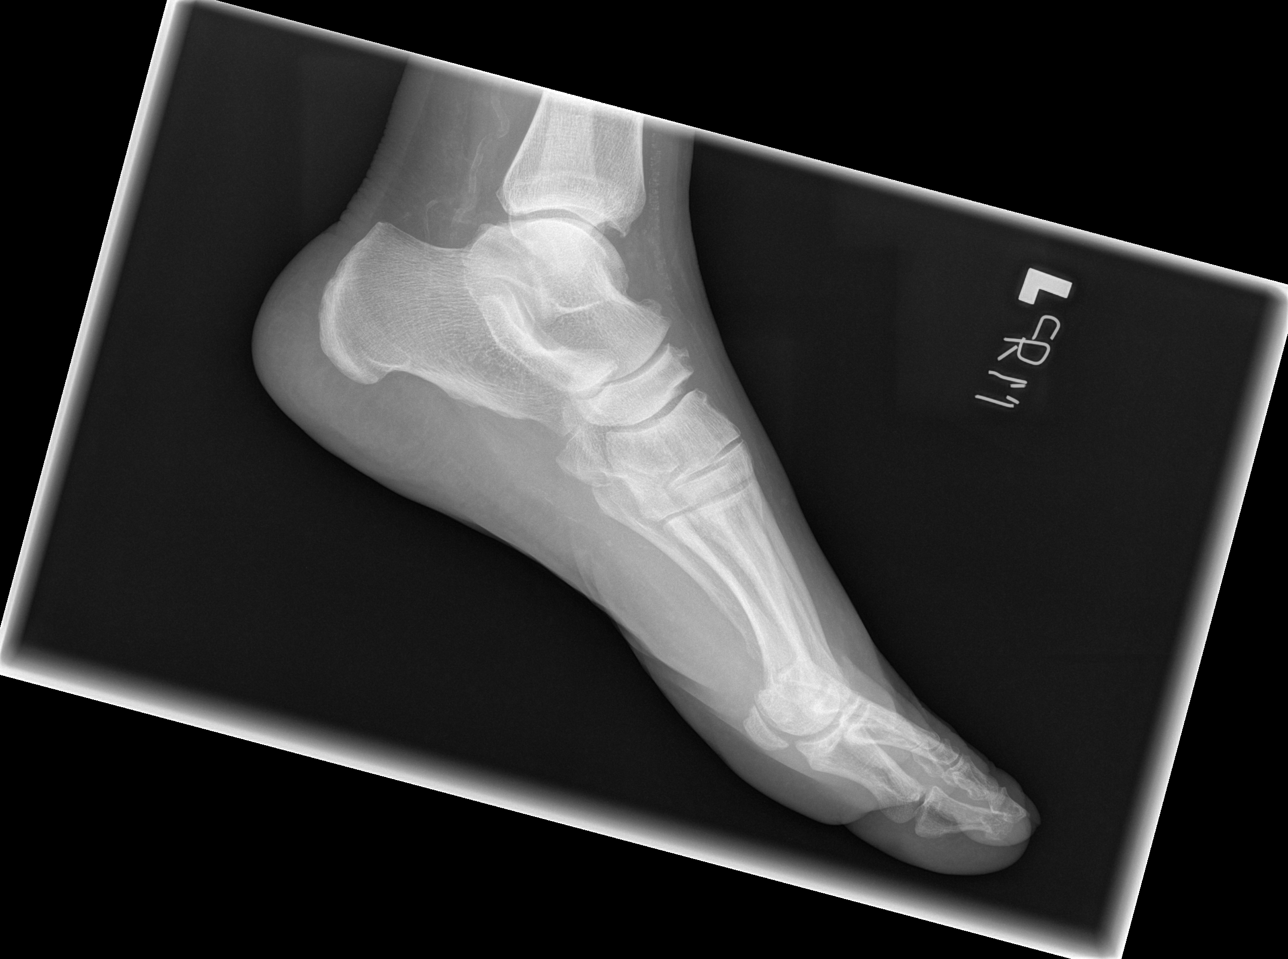

[3 of 3 positions shown; findings below may reference images not displayed]

FINDINGS: There is no evidence of fracture or dislocation.  Mild
irregularity at the bases of the first and fifth metatarsals may
reflect prior injury, and appears stable from the prior study.
Mild degenerative change is noted at the first metatarsophalangeal
joint.  The joint spaces are preserved.  There is no evidence of
talar subluxation; the subtalar joint is unremarkable in
appearance.

Mild vascular soft tissue calcification is noted overlying the
distal midfoot.
IMPRESSION: 1.  No evidence of fracture or dislocation.
2.  Mild vascular soft tissue calcification noted.

## 2011-11-25 ENCOUNTER — Ambulatory Visit (INDEPENDENT_AMBULATORY_CARE_PROVIDER_SITE_OTHER): Payer: Medicare Other | Admitting: Family Medicine

## 2011-11-25 ENCOUNTER — Other Ambulatory Visit: Payer: Self-pay

## 2011-11-25 ENCOUNTER — Inpatient Hospital Stay (HOSPITAL_COMMUNITY): Payer: Medicare Other

## 2011-11-25 ENCOUNTER — Inpatient Hospital Stay (HOSPITAL_COMMUNITY)
Admission: AD | Admit: 2011-11-25 | Discharge: 2011-11-27 | DRG: 287 | Disposition: A | Discharge status: Home / Self Care | Payer: Medicare Other | Source: Ambulatory Visit | Attending: Family Medicine | Admitting: Family Medicine

## 2011-11-25 ENCOUNTER — Emergency Department (HOSPITAL_COMMUNITY)
Admission: EM | Admit: 2011-11-25 | Discharge: 2011-11-25 | Discharge status: Against Medical Advice | Payer: Medicare Other | Source: Home / Self Care

## 2011-11-25 ENCOUNTER — Encounter (HOSPITAL_COMMUNITY): Payer: Self-pay | Admitting: *Deleted

## 2011-11-25 ENCOUNTER — Encounter: Payer: Self-pay | Admitting: Family Medicine

## 2011-11-25 ENCOUNTER — Encounter (HOSPITAL_COMMUNITY): Payer: Self-pay

## 2011-11-25 ENCOUNTER — Ambulatory Visit (HOSPITAL_COMMUNITY): Admit: 2011-11-25 | Discharge: 2011-11-25 | Disposition: A | Discharge status: Home / Self Care | Payer: Medicare Other

## 2011-11-25 VITALS — BP 149/77 | HR 76 | Ht 62.5 in | Wt 165.8 lb

## 2011-11-25 DIAGNOSIS — M549 Dorsalgia, unspecified: Secondary | ICD-10-CM | POA: Insufficient documentation

## 2011-11-25 DIAGNOSIS — I10 Essential (primary) hypertension: Secondary | ICD-10-CM | POA: Diagnosis present

## 2011-11-25 DIAGNOSIS — E119 Type 2 diabetes mellitus without complications: Secondary | ICD-10-CM | POA: Diagnosis present

## 2011-11-25 DIAGNOSIS — R0789 Other chest pain: Secondary | ICD-10-CM | POA: Insufficient documentation

## 2011-11-25 DIAGNOSIS — Z9861 Coronary angioplasty status: Secondary | ICD-10-CM

## 2011-11-25 DIAGNOSIS — Z8249 Family history of ischemic heart disease and other diseases of the circulatory system: Secondary | ICD-10-CM

## 2011-11-25 DIAGNOSIS — K219 Gastro-esophageal reflux disease without esophagitis: Secondary | ICD-10-CM | POA: Diagnosis present

## 2011-11-25 DIAGNOSIS — I2510 Atherosclerotic heart disease of native coronary artery without angina pectoris: Secondary | ICD-10-CM | POA: Diagnosis present

## 2011-11-25 DIAGNOSIS — Z794 Long term (current) use of insulin: Secondary | ICD-10-CM

## 2011-11-25 DIAGNOSIS — Z85850 Personal history of malignant neoplasm of thyroid: Secondary | ICD-10-CM

## 2011-11-25 DIAGNOSIS — Z7982 Long term (current) use of aspirin: Secondary | ICD-10-CM

## 2011-11-25 DIAGNOSIS — Z79899 Other long term (current) drug therapy: Secondary | ICD-10-CM

## 2011-11-25 DIAGNOSIS — E876 Hypokalemia: Secondary | ICD-10-CM | POA: Diagnosis present

## 2011-11-25 DIAGNOSIS — Z87891 Personal history of nicotine dependence: Secondary | ICD-10-CM

## 2011-11-25 DIAGNOSIS — I252 Old myocardial infarction: Secondary | ICD-10-CM

## 2011-11-25 DIAGNOSIS — E7800 Pure hypercholesterolemia, unspecified: Secondary | ICD-10-CM | POA: Diagnosis present

## 2011-11-25 DIAGNOSIS — R079 Chest pain, unspecified: Secondary | ICD-10-CM

## 2011-11-25 DIAGNOSIS — Z23 Encounter for immunization: Secondary | ICD-10-CM

## 2011-11-25 DIAGNOSIS — Z833 Family history of diabetes mellitus: Secondary | ICD-10-CM

## 2011-11-25 DIAGNOSIS — E039 Hypothyroidism, unspecified: Secondary | ICD-10-CM | POA: Diagnosis present

## 2011-11-25 DIAGNOSIS — M25519 Pain in unspecified shoulder: Secondary | ICD-10-CM | POA: Insufficient documentation

## 2011-11-25 HISTORY — DX: Acute myocardial infarction, unspecified: I21.9

## 2011-11-25 HISTORY — DX: Gastro-esophageal reflux disease without esophagitis: K21.9

## 2011-11-25 LAB — COMPREHENSIVE METABOLIC PANEL
ALT: 20 U/L (ref 0–35)
AST: 18 U/L (ref 0–37)
Albumin: 3.9 g/dL (ref 3.5–5.2)
Alkaline Phosphatase: 82 U/L (ref 39–117)
Chloride: 101 mEq/L (ref 96–112)
Potassium: 3.3 mEq/L — ABNORMAL LOW (ref 3.5–5.1)
Sodium: 141 mEq/L (ref 135–145)
Total Bilirubin: 0.4 mg/dL (ref 0.3–1.2)

## 2011-11-25 LAB — CARDIAC PANEL(CRET KIN+CKTOT+MB+TROPI)
Relative Index: 2.7 — ABNORMAL HIGH (ref 0.0–2.5)
Total CK: 102 U/L (ref 7–177)
Troponin I: <0.3 ng/mL (ref <0.30)

## 2011-11-25 LAB — CBC
HCT: 34.4 % — ABNORMAL LOW (ref 36.0–46.0)
Hemoglobin: 11.2 g/dL — ABNORMAL LOW (ref 12.0–15.0)
RDW: 14.9 % (ref 11.5–15.5)
WBC: 6.3 10*3/uL (ref 4.0–10.5)

## 2011-11-25 LAB — PHOSPHORUS: Phosphorus: 3 mg/dL (ref 2.3–4.6)

## 2011-11-25 LAB — GLUCOSE, CAPILLARY: Glucose-Capillary: 104 mg/dL — ABNORMAL HIGH (ref 70–99)

## 2011-11-25 MED ORDER — SODIUM CHLORIDE 0.9 % IV SOLN: 250.0000 mL | INTRAVENOUS | Status: DC | PRN

## 2011-11-25 MED ORDER — LISINOPRIL 40 MG PO TABS
40.0000 mg | ORAL_TABLET | Freq: Every day | ORAL | Status: DC
  Administered 2011-11-26 – 2011-11-27 (×2): 40 mg via ORAL
  Filled 2011-11-25 (×3): qty 1

## 2011-11-25 MED ORDER — CARVEDILOL 25 MG PO TABS
25.0000 mg | ORAL_TABLET | Freq: Two times a day (BID) | ORAL | Status: DC
  Administered 2011-11-25 – 2011-11-27 (×3): 25 mg via ORAL
  Filled 2011-11-25 (×7): qty 1

## 2011-11-25 MED ORDER — INSULIN ASPART 100 UNIT/ML ~~LOC~~ SOLN
0.0000 [IU] | Freq: Three times a day (TID) | SUBCUTANEOUS | Status: DC
  Administered 2011-11-26: 2 [IU] via SUBCUTANEOUS
  Administered 2011-11-26: 5 [IU] via SUBCUTANEOUS
  Administered 2011-11-27: 3 [IU] via SUBCUTANEOUS
  Filled 2011-11-25: qty 3

## 2011-11-25 MED ORDER — HYDROCHLOROTHIAZIDE 25 MG PO TABS
25.0000 mg | ORAL_TABLET | Freq: Every day | ORAL | Status: DC
  Administered 2011-11-26 – 2011-11-27 (×2): 25 mg via ORAL
  Filled 2011-11-25 (×3): qty 1

## 2011-11-25 MED ORDER — INSULIN GLARGINE 100 UNIT/ML ~~LOC~~ SOLN
10.0000 [IU] | Freq: Every day | SUBCUTANEOUS | Status: DC
  Administered 2011-11-26: 10 [IU] via SUBCUTANEOUS
  Filled 2011-11-25: qty 3

## 2011-11-25 MED ORDER — ASPIRIN 81 MG PO CHEW
81.0000 mg | CHEWABLE_TABLET | Freq: Every day | ORAL | Status: DC
  Administered 2011-11-26 – 2011-11-27 (×2): 81 mg via ORAL
  Filled 2011-11-25 (×2): qty 1

## 2011-11-25 MED ORDER — SODIUM CHLORIDE 0.9 % IJ SOLN: 3.0000 mL | INTRAMUSCULAR | Status: DC | PRN

## 2011-11-25 MED ORDER — AMLODIPINE BESYLATE 10 MG PO TABS
10.0000 mg | ORAL_TABLET | Freq: Every day | ORAL | Status: DC
  Administered 2011-11-26 – 2011-11-27 (×2): 10 mg via ORAL
  Filled 2011-11-25 (×3): qty 1

## 2011-11-25 MED ORDER — LEVOTHYROXINE SODIUM 50 MCG PO TABS
50.0000 ug | ORAL_TABLET | Freq: Every day | ORAL | Status: DC
  Administered 2011-11-26 – 2011-11-27 (×2): 50 ug via ORAL
  Filled 2011-11-25 (×3): qty 1

## 2011-11-25 MED ORDER — ISOSORBIDE MONONITRATE ER 60 MG PO TB24
60.0000 mg | ORAL_TABLET | Freq: Every day | ORAL | Status: DC
  Administered 2011-11-26: 60 mg via ORAL
  Filled 2011-11-25 (×3): qty 1

## 2011-11-25 MED ORDER — HEPARIN SODIUM (PORCINE) 5000 UNIT/ML IJ SOLN
5000.0000 [IU] | Freq: Three times a day (TID) | INTRAMUSCULAR | Status: DC
  Administered 2011-11-26: 5000 [IU] via SUBCUTANEOUS
  Filled 2011-11-25 (×5): qty 1

## 2011-11-25 MED ORDER — NITROGLYCERIN 0.4 MG SL SUBL
0.4000 mg | SUBLINGUAL_TABLET | Freq: Once | SUBLINGUAL | Status: AC
  Administered 2011-11-25: 0.4 mg via SUBLINGUAL

## 2011-11-25 MED ORDER — SODIUM CHLORIDE 0.9 % IJ SOLN: 3.0000 mL | Freq: Two times a day (BID) | INTRAMUSCULAR | Status: DC

## 2011-11-25 MED ORDER — FERROUS SULFATE 325 (65 FE) MG PO TABS
325.0000 mg | ORAL_TABLET | Freq: Two times a day (BID) | ORAL | Status: DC
  Administered 2011-11-26 – 2011-11-27 (×3): 325 mg via ORAL
  Filled 2011-11-25 (×6): qty 1

## 2011-11-25 MED ORDER — PNEUMOCOCCAL VAC POLYVALENT 25 MCG/0.5ML IJ INJ
0.5000 mL | INJECTION | INTRAMUSCULAR | Status: AC
  Administered 2011-11-26: 0.5 mL via INTRAMUSCULAR
  Filled 2011-11-25: qty 0.5

## 2011-11-25 NOTE — ED Notes (Signed)
Patient presents with constant, dull, achy, right shoulder pain radiating to back and across RUQ of the abdomen.  Patient reports she may have pulled a muscle a few weeks ago on the left shoulder.  Patient denies n/v and SOB.

## 2011-11-25 NOTE — H&P (Signed)
[name] is an 70 y.o. female.    PCP: Rivka Safer  Chief Complaint: Back and chest pain  HPI: 70 yo female with DM and CAD presents from ED waiting room with subacute chest pain. Patient was in normal state of health until this morning. Woke up around 8 am as usual and noticed some mid-thoracic right back pain. This radiates to right rib margin and under right breast to mid-sternum. Worsened throughout the day. Has been constant, not alleviated by anything or worsened by anything including deep inspiration. Did not try nitroglycerin because she could not afford the prescription and doesn't have any at home. Has only taken her daily prescribed meds today including an aspirin. Denies dyspnea, diaphoresis, n/v/d, edema, palpitations. Denies any knowledge of exertion or straining, heavy lifting, falls or trauma.   She ate a hamburger for lunch at 11:00, then decided to present to ED for evaluation. Patient arrived around noon today to [facility] ED and was triaged in waiting room for several hours. She reports an EKG was performed, the she questioned the staff regarding how much longer and informed she would have to wait 4 more hours. Patient then decided to walk to [facility] for MD evaluation.   Has history of 2x PCI with angioplasty in 2001 following NSTEMI and more recently 2010 with ballooning of 80% diagonal of LAD. Follows with Dr. Eden Emms and is due for check up.   Review of Systems  See HPI otherwise negative.     Past Medical History  Diagnosis Date  . History of thyroid cancer   . ETD (eustachian tube dysfunction)   . CTS (carpal tunnel syndrome)   . Anemia   . Gastritis   . Postmenopausal   . Chest wall pain   . Hypothyroid   . Screening for malignant neoplasm of cervix   . Cor pulmonale   . Acute URI   . Hypertension, benign   . Diabetes mellitus, type II   . Hypercholesteremia     Past Surgical History  Procedure Date  . Angioplasty     PDA 90-40 %  . Thyroidectomy   . Breast  biopsy   . Cholecystectomy 08-23-2002  . Endometrial biopsy 02-20-1998  . Bartholin gland cyst excision 09-13-2004    Keratosis- no CA  . Ptca 10/23/2000 and 03/15/09  . Cardiac catheterization 03/15/2009    stenosis of diagonal branch of LAD- PTCA performed    Family History  Problem Relation Age of Onset  . Diabetes Mother     Deceased- hypoglycemic coma   . Hypertension Mother    Social History:  reports that she quit smoking about 30 years ago. Her smoking use included Cigarettes. She quit smokeless tobacco use about 17 years ago. Her smokeless tobacco use included Chew. She reports that she does not drink alcohol. Her drug history not on file.  Allergies:  Allergies  Allergen Reactions  . Codeine     REACTION: GI Intolerance  . Sulfamethoxazole W/Trimethoprim     REACTION: Gi intolerence    Medications Prior to Admission  Medication Dose Route Frequency Provider Last Rate Last Dose  . nitroGLYCERIN (NITROSTAT) SL tablet 0.4 mg  0.4 mg Sublingual Once Lloyd Huger, MD   0.4 mg at 11/25/11 1653   Medications Prior to Admission  Medication Sig Dispense Refill  . amLODipine (NORVASC) 10 MG tablet TAKE ONE TABLET BY MOUTH EVERY DAY  90 tablet  0  . aspirin 81 MG chewable tablet Chew 81 mg by mouth daily.        [name]  carvedilol (COREG) 25 MG tablet Take 1 tablet (25 mg total) by mouth 2 (two) times daily.  60 tablet  11  . ferrous sulfate 325 (65 FE) MG tablet Take 325 mg by mouth 2 (two) times daily.        . fluticasone (FLONASE) 50 MCG/ACT nasal spray Place 2 sprays into the nose daily.       [name] glipiZIDE (GLUCOTROL) 10 MG 24 hr tablet Take 10 mg by mouth daily.        . hydrochlorothiazide 25 MG tablet Take 1 tablet (25 mg total) by mouth daily.  30 tablet  11  . insulin glargine (LANTUS) 100 UNIT/ML injection Inject 10 Units into the skin every morning.        . isosorbide mononitrate (IMDUR) 60 MG 24 hr tablet Take 60 mg by mouth daily.        [name] levothyroxine (SYNTHROID, LEVOTHROID)  50 MCG tablet TAKE ONE TABLET BY MOUTH EVERY DAY  90 tablet  0  . lisinopril (PRINIVIL,ZESTRIL) 40 MG tablet Take 40 mg by mouth daily.        . metFORMIN (GLUCOPHAGE) 1000 MG tablet Take 1,000 mg by mouth 2 (two) times daily.        . nitroGLYCERIN (NITROSTAT) 0.4 MG SL tablet Place 1 tablet (0.4 mg total) under the tongue every 5 (five) minutes as needed for chest pain.  90 tablet  3    No results found for this or any previous visit (from the past 48 hour(s)). No results found.  ROS  There were no vitals taken for this visit. Physical Exam   Objective:     Physical Exam  Vitals reviewed. Wt 165.8, P 76, BP 146/77  Constitutional: She is oriented to person, place, and time. She appears well-developed and well-nourished. No distress.  HENT:  Head: Normocephalic and atraumatic.  Eyes: EOM are normal. Pupils are equal, round, and reactive to light.  Cardiovascular: Normal rate, regular rhythm, normal heart sounds and intact distal pulses. Exam reveals no gallop.  No murmur heard.  Pulmonary/Chest: Effort normal and breath sounds normal. No respiratory distress. She has no wheezes. She has no rales. She exhibits no tenderness.  Abdominal: Soft. Bowel sounds are normal. She exhibits no distension. There is no tenderness.  Musculoskeletal: She exhibits no edema and no tenderness.  No TTP in paraspinal musculature. Abduction of right arm causing increased "pulling" in shoulder and neck area.  Neurological: She is alert and oriented to person, place, and time. No cranial nerve deficit. Coordination normal.  Skin: No rash noted. She is not diaphoretic.  Psychiatric: She has a normal mood and affect.   12 lead EKG: NSR 67 bpm, PR 146, Qtc 399, No change in twi from previous study.   Assessment/Plan  70 yo female with DM and known CAD presents with atypical chest pain relieved by nitroglycerin in clinic.  1. Chest pain. Atypical location with right back and rib/sternal location and  nonexertional; however, patient has known CAD and this is relieved by nitroglycerin which is concerning for cardiac etiology. Pattern also seems just as likely of musculoskeletal etiology. Office EKG has no remarkable changes from previous studies. Patient will be admitted to telemetry unit for observation and ischemia rule out. Will draw enzyme panel, CBC, INR/PTT, CMET on arrival. Cycle cardiac enzymes q6 hr x 3. Repeat EKG in am. Have not ordered additional pain medication at this time so that MD will be notified for any recurrence. Will hold on starting  heparin drip. Patient has already taken her aspirin today. Would consult her primary cardiologist (Dr. Eden Emms with [facility]) in the am if pain returns or deemed beneficial by primary team.   2. HTN. Will continue home antihypertensives as her BP is slightly elevated. Check CMET with labs.  3. DM. Will continue home dose lantus 10 units and sliding scale insulin for meals. Carb modified diet.  4. Hypothyroidism. Continue home thyroid replacement.  5. PPX. Heparin sq q8hr.   6. Dispo. Pending evaluation. Full code. Patient sent by private car to admission office.   Case d/w Dr. Leveda Anna. I have called [facility] bed control and confirmed that telemetry bed is available for direct admission.    Jakaree Pickard 11/25/2011, 5:07 PM

## 2011-11-25 NOTE — Progress Notes (Signed)
  Subjective:    Patient ID: [name], female    DOB: 1941-10-24, 70 y.o.   MRN: [id number]  HPI  1. Back/neck and chest pain. Patient was in normal state of health until this morning. Woke up around 8 am as usual and noticed some mid-thoracic right back pain. This radiates to right rib margin and under right breast to mid-sternum. Worsened throughout the day. Has been constant, not alleviated by anything or worsened by anything including deep inspiration. Has not tried nitroglycerin she is prescribed. Has only taken her daily prescribed meds today including an aspirin. Denies dyspnea, diaphoresis, n/v/d, edema, palpitations. Denies any knowledge of exertion or straining, heavy lifting, falls or trauma.   She ate a hamburger for lunch at 11:00, then decided to present to ED for evaluation.  Patient arrived around noon today and was triaged in waiting room for several hours. Questioned the staff regarding how much longer and informed she would have to wait 4 more hours. Patient then decided to walk to [facility] for MD evaluation.   Has history of 2x PCI with angioplasty in 2001 following NSTEMI and more recently 2010 with ballooning of 80% diagonal of LAD. Follows with Dr. Eden Emms and is due for check up.   Review of Systems See HPI otherwise negative.     Objective:   Physical Exam  Vitals reviewed. Constitutional: She is oriented to person, place, and time. She appears well-developed and well-nourished. No distress.  HENT:  Head: Normocephalic and atraumatic.  Eyes: EOM are normal. Pupils are equal, round, and reactive to light.  Cardiovascular: Normal rate, regular rhythm, normal heart sounds and intact distal pulses.  Exam reveals no gallop.   No murmur heard. Pulmonary/Chest: Effort normal and breath sounds normal. No respiratory distress. She has no wheezes. She has no rales. She exhibits no tenderness.  Abdominal: Soft. Bowel sounds are normal. She exhibits no distension. There is no  tenderness.  Musculoskeletal: She exhibits no edema and no tenderness.       No TTP in paraspinal musculature. Abduction of right arm causing increased "pulling" in shoulder and neck area.   Neurological: She is alert and oriented to person, place, and time. No cranial nerve deficit. Coordination normal.  Skin: No rash noted. She is not diaphoretic.  Psychiatric: She has a normal mood and affect.    12 lead EKG: NSR 67 bpm, PR 146, Qtc 399, No change in twi from previous study.      Assessment & Plan:

## 2011-11-25 NOTE — Patient Instructions (Signed)
Go to admissions office. Have office page the doctor on call [phone] once you are in your room.

## 2011-11-25 NOTE — Assessment & Plan Note (Addendum)
Association with primarily back pain radiating to right chest, atypical onset, normal EKG today. Nonexertional but is relieved by nitroglycerin in the office today. This is concerning for cardiac etiology. Most likely this is musculoskeletal pain, but patient has long history of known CAD with most recent catheterization in 2010 with ballooning. Will admit to telemetry bed for cardiac rule out and further evaluation. I have called admitting and will send patient over for direct admission. Case d/w Dr. Leveda Anna in clinic today.

## 2011-11-26 ENCOUNTER — Ambulatory Visit (HOSPITAL_COMMUNITY): Admit: 2011-11-26 | Payer: Self-pay | Admitting: Cardiology

## 2011-11-26 ENCOUNTER — Encounter (HOSPITAL_COMMUNITY)
Admission: AD | Disposition: A | Discharge status: Home / Self Care | Payer: Self-pay | Source: Ambulatory Visit | Attending: Family Medicine

## 2011-11-26 DIAGNOSIS — R079 Chest pain, unspecified: Secondary | ICD-10-CM

## 2011-11-26 HISTORY — PX: LEFT HEART CATHETERIZATION WITH CORONARY ANGIOGRAM: SHX5451

## 2011-11-26 LAB — BASIC METABOLIC PANEL
GFR calc Af Amer: 82 mL/min — ABNORMAL LOW (ref >90)
GFR calc non Af Amer: 71 mL/min — ABNORMAL LOW (ref >90)
Potassium: 3.2 mEq/L — ABNORMAL LOW (ref 3.5–5.1)
Sodium: 140 mEq/L (ref 135–145)

## 2011-11-26 LAB — TSH: TSH: 0.742 u[IU]/mL (ref 0.350–4.500)

## 2011-11-26 LAB — CARDIAC PANEL(CRET KIN+CKTOT+MB+TROPI)
Relative Index: INVALID (ref 0.0–2.5)
Troponin I: <0.3 ng/mL (ref <0.30)

## 2011-11-26 LAB — GLUCOSE, CAPILLARY
Glucose-Capillary: 134 mg/dL — ABNORMAL HIGH (ref 70–99)
Glucose-Capillary: 278 mg/dL — ABNORMAL HIGH (ref 70–99)

## 2011-11-26 LAB — HEMOGLOBIN A1C
Hgb A1c MFr Bld: 8.6 % — ABNORMAL HIGH (ref <5.7)
Mean Plasma Glucose: 200 mg/dL — ABNORMAL HIGH (ref <117)

## 2011-11-26 SURGERY — LEFT HEART CATHETERIZATION WITH CORONARY ANGIOGRAM
Anesthesia: LOCAL

## 2011-11-26 SURGERY — LEFT HEART CATHETERIZATION WITH CORONARY ANGIOGRAM
Anesthesia: Moderate Sedation | Laterality: Left

## 2011-11-26 MED ORDER — HEPARIN (PORCINE) IN NACL 2-0.9 UNIT/ML-% IJ SOLN
INTRAMUSCULAR | Status: AC
  Filled 2011-11-26: qty 2000

## 2011-11-26 MED ORDER — LIDOCAINE HCL (PF) 1 % IJ SOLN
INTRAMUSCULAR | Status: AC
  Filled 2011-11-26: qty 30

## 2011-11-26 MED ORDER — VERAPAMIL HCL 2.5 MG/ML IV SOLN
INTRAVENOUS | Status: AC
  Filled 2011-11-26: qty 2

## 2011-11-26 MED ORDER — DIAZEPAM 5 MG PO TABS
ORAL_TABLET | ORAL | Status: AC
  Administered 2011-11-26: 5 mg via ORAL
  Filled 2011-11-26: qty 1

## 2011-11-26 MED ORDER — HEPARIN SODIUM (PORCINE) 1000 UNIT/ML IJ SOLN
INTRAMUSCULAR | Status: AC
  Filled 2011-11-26: qty 1

## 2011-11-26 MED ORDER — NITROGLYCERIN 0.2 MG/ML ON CALL CATH LAB
INTRAVENOUS | Status: AC
  Filled 2011-11-26: qty 1

## 2011-11-26 MED ORDER — SODIUM CHLORIDE 0.9 % IJ SOLN: 3.0000 mL | INTRAMUSCULAR | Status: DC | PRN

## 2011-11-26 MED ORDER — ACETAMINOPHEN 325 MG PO TABS: 650.0000 mg | ORAL_TABLET | Freq: Four times a day (QID) | ORAL | Status: AC | PRN

## 2011-11-26 MED ORDER — ASPIRIN 81 MG PO CHEW
324.0000 mg | CHEWABLE_TABLET | ORAL | Status: AC
  Administered 2011-11-26: 324 mg via ORAL

## 2011-11-26 MED ORDER — DIAZEPAM 5 MG PO TABS
5.0000 mg | ORAL_TABLET | ORAL | Status: AC
  Administered 2011-11-26: 5 mg via ORAL

## 2011-11-26 MED ORDER — POTASSIUM CHLORIDE CRYS ER 20 MEQ PO TBCR
40.0000 meq | EXTENDED_RELEASE_TABLET | Freq: Two times a day (BID) | ORAL | Status: DC
  Administered 2011-11-26: 40 meq via ORAL

## 2011-11-26 MED ORDER — SODIUM CHLORIDE 0.9 % IV SOLN: 250.0000 mL | INTRAVENOUS | Status: DC | PRN

## 2011-11-26 MED ORDER — ASPIRIN 81 MG PO CHEW
CHEWABLE_TABLET | ORAL | Status: AC
Start: 2011-11-26 — End: 2011-11-27
  Filled 2011-11-26: qty 4

## 2011-11-26 MED ORDER — POTASSIUM CHLORIDE CRYS ER 20 MEQ PO TBCR
40.0000 meq | EXTENDED_RELEASE_TABLET | Freq: Two times a day (BID) | ORAL | Status: DC
  Administered 2011-11-26 – 2011-11-27 (×2): 40 meq via ORAL
  Filled 2011-11-26 (×4): qty 2

## 2011-11-26 MED ORDER — ACETAMINOPHEN 325 MG PO TABS
650.0000 mg | ORAL_TABLET | Freq: Four times a day (QID) | ORAL | Status: DC | PRN
  Administered 2011-11-26: 650 mg via ORAL
  Filled 2011-11-26: qty 2

## 2011-11-26 MED ORDER — MIDAZOLAM HCL 2 MG/2ML IJ SOLN
INTRAMUSCULAR | Status: AC
  Filled 2011-11-26: qty 2

## 2011-11-26 MED ORDER — SODIUM CHLORIDE 0.9 % IV SOLN: INTRAVENOUS | Status: AC

## 2011-11-26 MED ORDER — NITROGLYCERIN 0.4 MG SL SUBL: 0.4000 mg | SUBLINGUAL_TABLET | Freq: Once | SUBLINGUAL | Status: DC

## 2011-11-26 MED ORDER — POTASSIUM CHLORIDE CRYS ER 20 MEQ PO TBCR
EXTENDED_RELEASE_TABLET | ORAL | Status: AC
  Filled 2011-11-26: qty 2

## 2011-11-26 NOTE — Progress Notes (Signed)
Family Medicine Teaching Service Attending Note  I interviewed and examined patient [name] and reviewed their tests and x-rays.  I discussed with Dr. [name] and reviewed her note for today.  I agree with her assessment and plan.     Appreciate Dr Eden Emms seeing [name] will proceed per his recommendations  Her diabetic control has worsen recently will work to get her A1c back below 8

## 2011-11-26 NOTE — Progress Notes (Signed)
Clinical Child psychotherapist (CSW) completed psychosocial assessment which can be found in pt shadow chart. CSW provided pt with an advanced directive packet and explained what each section was. Pt stated she would look through packet and contact CSW if she decides to complete it. Theresia Bough, MSW, Theresia Majors [phone]

## 2011-11-26 NOTE — Interval H&P Note (Signed)
History and Physical Interval Note:  11/26/2011 4:13 PM  [name]  has presented today for surgery, with the diagnosis of chestpain  The various methods of treatment have been discussed with the patient and family. After consideration of risks, benefits and other options for treatment, the patient has consented to  Procedure(s): LEFT HEART CATHETERIZATION WITH CORONARY ANGIOGRAM as a surgical intervention .  The patients' history has been reviewed, patient examined, no change in status, stable for surgery.  I have reviewed the patients' chart and labs.  Questions were answered to the patient's satisfaction.     Theron Arista [place]

## 2011-11-26 NOTE — Op Note (Signed)
Cardiac Catheterization Procedure Note  Name: [name] MRN: [id number] DOB: [birth year]  Procedure: Left Heart Cath, Selective Coronary Angiography, LV angiography  Indication: 43 year year-old female with history of coronary disease status post remote angioplasty of the PDA. She is also status post angioplasty of the first diagonal branch and 2010. She presents with symptoms of new chest pain.   Procedural Details: The right wrist was prepped, draped, and anesthetized with 1% lidocaine. Using the modified Seldinger technique, a 5 French sheath was introduced into the right radial artery. 3 mg of verapamil was administered through the sheath, weight-based unfractionated heparin was administered intravenously. Standard Judkins catheters were used for selective coronary angiography and left ventriculography. Catheter exchanges were performed over an exchange length guidewire. There were no immediate procedural complications. A TR band was used for radial hemostasis at the completion of the procedure.  The patient was transferred to the post catheterization recovery area for further monitoring.  Procedural Findings: Hemodynamics: AO 105/50 with a mean of 72 mmHg. LV 104 with an EDP of 3 mm mercury  Coronary angiography: Coronary dominance: right  Left mainstem: Normal.  Left anterior descending (LAD): This is a tortuous vessel. There is 20% narrowing in the proximal vessel and 30% narrowing in the mid vessel. The first diagonal is widely patent at the prior angioplasty site.  Ramus intermediate branch is mildly calcified. It has 20% disease proximally.  Left circumflex (LCx): Relatively small vessel continuing in the AV groove. It is without significant disease.  Right coronary artery (RCA): This is a large dominant vessel. Her sed disease in the proximal vessel. The PDA is widely patent. There is mild 30% disease just prior to the PDA bifurcation.  Left ventriculography: Left  ventricular systolic function is normal, LVEF is estimated at 55-65%, there is no significant mitral regurgitation   Final Conclusions:  1. Minor nonobstructive atherosclerotic coronary disease. The previous angioplasty sites are still patent. 2. Normal left ventricular function.  Recommendations: Would pursue evaluation of noncardiac causes of chest pain.  Peter Swaziland MD 11/26/2011, 4:50 PM

## 2011-11-26 NOTE — Consult Note (Signed)
CARDIOLOGY CONSULT NOTE  Patient ID: [name] MRN: [id number] DOB/AGE: 05-26-1941 70 y.o.  Admit date: 11/25/2011 Referring Physician:  [name] [place] Primary Physician: [name], MD Primary Cardiologist:  Eden Emms Reason for Consultation: Chest pain  Principal Problem:  *Chest pain   HPI:  70 yo admitted with SSCP.  70 y.o. is seen today at the request of Dr [name].   Seh has hypertension hypercholesterolemia diabetes and coronary artery disease. 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 She had recurrent angina in March 2010 and had a cutting balloon therapy of her diagonal branch by Dr. [name]. Previous pain has been atypical.  BS poorly controlled with A1c over 8.5.  Had pain in ER yesterday relieved with nitro.  Pain atypical right sided and intermitant.  However she describes it as similar to pain in 2001.  In hospital has R/O and had no ECG changes.  Currently pain free.     @ROS @ All other systems reviewed and negative except as noted above  Past Medical History  Diagnosis Date  . History of thyroid cancer   . ETD (eustachian tube dysfunction)   . CTS (carpal tunnel syndrome)   . Anemia   . Gastritis   . Postmenopausal   . Chest wall pain   . Hypothyroid   . Screening for malignant neoplasm of cervix   . Cor pulmonale   . Acute URI   . Hypertension, benign   . Diabetes mellitus, type II   . Hypercholesteremia   . Angina   . GERD (gastroesophageal reflux disease)   . Myocardial infarction     Family History  Problem Relation Age of Onset  . Diabetes Mother     Deceased- hypoglycemic coma   . Hypertension Mother     History   Social History  . Marital Status: Widowed    Spouse Name: N/A    Number of Children: 6  . Years of Education: N/A   Occupational History  . Housekeeper [employer]   Social History Main Topics  . Smoking status: Former Smoker    Types: Cigarettes    Quit date: 12/23/1980  . Smokeless tobacco: Former  Neurosurgeon    Types: Chew    Quit date: 08/23/1994  . Alcohol Use: No  . Drug Use: No  . Sexually Active: No   Other Topics Concern  . Not on file   Social History Narrative  . No narrative on file    Past Surgical History  Procedure Date  . Angioplasty     PDA 90-40 %  . Thyroidectomy   . Cholecystectomy 08-23-2002  . Endometrial biopsy 02-20-1998  . Bartholin gland cyst excision 09-13-2004    Keratosis- no CA  . Ptca 10/23/2000 and 03/15/09  . Cardiac catheterization 03/15/2009    stenosis of diagonal branch of LAD- PTCA performed  . Coronary angioplasty   . Tonsillectomy   . Breast biopsy     LEFT SPOT REMOVED  . Tubal ligation         . amLODipine  10 mg Oral Daily  . aspirin  81 mg Oral Daily  . carvedilol  25 mg Oral BID WC  . ferrous sulfate  325 mg Oral BID  . heparin  5,000 Units Subcutaneous Q8H  . hydrochlorothiazide  25 mg Oral Daily  . insulin aspart  0-15 Units Subcutaneous TID WC  . insulin glargine  10 Units Subcutaneous QHS  . isosorbide mononitrate  60 mg Oral Daily  . levothyroxine  50 mcg Oral QAC breakfast  . lisinopril  40 mg Oral Daily  . pneumococcal 23 valent vaccine  0.5 mL Intramuscular Tomorrow-1000  . potassium chloride SA      . potassium chloride  40 mEq Oral BID  . sodium chloride  3 mL Intravenous Q12H      Physical Exam: Blood pressure 127/64, pulse 64, temperature 98.2 F (36.8 C), temperature source Oral, resp. rate 18, last menstrual period 11/24/1992, SpO2 99.00%.   Affect appropriate  Obese Healthy:  appears stated age HEENT: normal Neck supple with no adenopathy JVP normal no bruits no thyromegaly Lungs clear with no wheezing and good diaphragmatic motion Heart:  S1/S2 no murmur,rub, gallop or click PMI normal Abdomen: benighn, BS positve, no tenderness, no AAA no bruit.  No HSM or HJR Distal pulses intact with no bruits No edema Neuro non-focal Skin warm and dry No muscular weakness   Labs:   Lab Results  Component  Value Date   WBC 6.3 11/25/2011   HGB 11.2* 11/25/2011   HCT 34.4* 11/25/2011   MCV 81.7 11/25/2011   PLT 167 11/25/2011    Lab 11/26/11 0148 11/25/11 1949  NA 140 --  K 3.2* --  CL 102 --  CO2 28 --  BUN 18 --  CREATININE 0.83 --  CALCIUM 9.3 --  PROT -- 7.8  BILITOT -- 0.4  ALKPHOS -- 82  ALT -- 20  AST -- 18  GLUCOSE 206* --   Lab Results  Component Value Date   CKTOTAL 84 11/26/2011   CKMB 2.2 11/26/2011   TROPONINI <0.30 11/26/2011    Lab Results  Component Value Date   CHOL 137 01/05/2010   CHOL  Value: 95        ATP III CLASSIFICATION:  <200     mg/dL   Desirable  454-098  mg/dL   Borderline High  >=119    mg/dL   High        1/47/8295   CHOL 132 12/19/2008   Lab Results  Component Value Date   HDL 47 01/05/2010   HDL 31* 03/14/2009   HDL 50 12/19/2008   Lab Results  Component Value Date   LDLCALC 72 01/05/2010   LDLCALC  Value: 44        Total Cholesterol/HDL:CHD Risk Coronary Heart Disease Risk Table                     Men   Women  1/2 Average Risk   3.4   3.3  Average Risk       5.0   4.4  2 X Average Risk   9.6   7.1  3 X Average Risk  23.4   11.0        Use the calculated Patient Ratio above and the CHD Risk Table to determine the patient's CHD Risk.        ATP III CLASSIFICATION (LDL):  <100     mg/dL   Optimal  621-308  mg/dL   Near or Above                    Optimal  130-159  mg/dL   Borderline  657-846  mg/dL   High  >962     mg/dL   Very High 9/52/8413   LDLCALC 65 12/19/2008   Lab Results  Component Value Date   TRIG 91 01/05/2010   TRIG 102 03/14/2009   TRIG 83 12/19/2008  Lab Results  Component Value Date   CHOLHDL 2.9 Ratio 01/05/2010   CHOLHDL 3.1 03/14/2009   CHOLHDL 2.6 Ratio 12/19/2008   Lab Results  Component Value Date   LDLDIRECT 76 02/06/2011      Radiology: Portable Chest 1 View  11/25/2011  *RADIOLOGY REPORT*  Clinical Data: Chest pain; history of diabetes.  PORTABLE CHEST - 1 VIEW  Comparison: Chest radiograph performed 03/13/2009   Findings: The lungs are well-aerated and clear.  There is no evidence of focal opacification, pleural effusion or pneumothorax.  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  Postoperative change is noted about the thyroid bed.  Thyroid ultrasound and correlation with laboratory findings would be helpful for further evaluation.  The cardiomediastinal silhouette is within normal limits.  No acute osseous abnormalities are seen.  IMPRESSION:  1.  No acute cardiopulmonary process seen. 2.  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 Postoperative change is noted about the thyroid bed.  Correlation with laboratory findings and thyroid ultrasound would be helpful for further evaluation, when and as deemed clinically appropriate.  Original Report Authenticated By: Tonia Ghent, M.D.    EKG:  NSR  Inferolateral and anterolateral T wave inversions slightly worse than 06/2010  ASSESSMENT AND PLAN:  Chest Pain:  Poorly controlled diabetic with known disease.  Pain relieved with nitro and previous pain prior to intervention atypical.  Favor cath.  Discussed with patient willing to proceed.  Will try to do today since we only have one doctor in the lab tomorrow. DM:  Discussed importance of diet and low carb intake.   Thyroid.  Abnormal on Xray consider F/U dedicated thyroid US Chol:  Continue statin LDL at goal.   Low K:  Supplement prior to cath.  SignedCharlton Haws 11/26/2011, 2:20 PM

## 2011-11-26 NOTE — Progress Notes (Signed)
Family Medicine Teaching Service [facility] Progress Note  Patient name: [name] Medical record number: [id number] Date of birth: Jan 26, 1941 Age: 70 y.o. Gender: female    LOS: 1 day   Primary Care Provider: [name], MD  Overnight Events: No overnight events. Slept well. Good appetite. Pain is more like soreness on the back of left shoulder.  Objective: Vital signs in last 24 hours: Temp:  98.1 F Pulse Rate:   67  Resp:  18   BP:133/86 mmHg  SpO2:  98 %  Weight:  165 lb    Current Facility-Administered Medications  Medication Dose Route Frequency Last Dose  . 0.9 %  sodium chloride infusion  250 mL Intravenous PRN    . amLODipine (NORVASC) tablet 10 mg  10 mg Oral Daily    . aspirin chewable tablet 81 mg  81 mg Oral Daily   . carvedilol (COREG) tablet 25 mg  25 mg Oral BID WC 25 mg at 11/25/11 2211  . ferrous sulfate tablet 325 mg  325 mg Oral BID    . heparin injection 5,000 Units  5,000 Units Subcutaneous Q8H    . Hydrochlorothiazide tablet 25 mg  25 mg Oral Daily    . insulin aspart (novoLOG) 0-15 Units  0-15 Units Subcutaneous TID WC    . insulin glargine (LANTUS) 10 Units  10 Units Subcutaneous QHS    . isosorbide mononitrate (IMDUR) 24 hr tablet 60 mg  60 mg Oral Daily    . levothyroxine (SYNTHROID, LEVOTHROID) tablet 50 mcg  50 mcg Oral QAC breakfast    . lisinopril tablet 40 mg  40 mg Oral Daily    . pneumococcal 23 valent vaccine (PNU-IMMUNE) injection 0.5 mL  0.5 mL Intramuscular Tomorrow-1000    . sodium chloride 0.9 % injection 3 mL  3 mL Intravenous Q12H    . sodium chloride 0.9 % injection 3 mL  3 mL Intravenous PRN      PE: Gen: NAD HEENT: PEERL and EOMI CV: RRR. No murmurs. No rubs. Res: Clear breath sounds bilaterally. No rales. Abd: Soft. No tenderness to palpation. No distention. Ext/Musc: Reproducible pain to palpation on the back of left shoulder.  Neuro:Oriented times 3. No neurologic focalization.   Labs/Studies: CBC WBC: 6.3,  Hb 11.2, Plat 167 Na 140, K 3.2, Cl 102, Cr, 0.83 Ca 93 TSH 0.742 Negative troponin I X 3. A1C 8.6 EKG with no changes to prior EKG in 2011.  Assessment/Plan: 70 yo female with DM and known CAD its admitted with atypical chest pain.  1. Chest pain. Atypical location with right back and rib/sternal location yesterday and today has moved to the back of left shoulder. Reproducible with palpation. Cardiac enzymes negatives times 3, and EKG with no changes since 2011.The pain seems of  musculoskeletal etiology, nevertheless we consulted with her cardiologist Dr. Eden Emms who will come and se her today. -Acetaminophen 650 mg PRN for pain.  2. HTN. Controlled -Will continue home antihypertensives and monitor BP.  3. Hypokalemia: K 3.2 today. -KCl 40 meq 2 doses and recheck BMET at 2pm today.  4. DM. A1C no at goal. Glucose 206 and mid 100's -Will continue lantus 10 units and sliding scale insulin for meals. Carb modified diet.  4. Hypothyroidism. TSH normal. - Continue home thyroid replacement.   5. PPX. Heparin sq q8hr.   6. Dispo. Possible discharge home today.   Signed: Marcelle Overlie, MD Family Medicine Resident PGY-1 11/26/2011 7:31 AM

## 2011-11-26 NOTE — Progress Notes (Signed)
  Subjective:    Patient ID: [name], female    DOB: [birth year], 70 y.o.   MRN: [id number]  HPI Comments: This note serves as the [facility] attending admit note.  It is also a late entry.  I saw and examined Ms Strine in the [facility] yesterday at ~5PM with Dr. Cristal Ford.  I have reviewed her EKG and orders and agree with the management.  Briefly,  Ms Tyer is a 70 year old female with chest pain atypical for cardiac origin.  It is constant, started in back and radiated around the right side of chest to midline.  No worsening with exercise.  No relationship to eating.  No SOB, nausea, vomiting or diaphoresis.  She does have know coronary artery disease with two previous episodes (MI?).  Both of these episodes had atypical chest pain on presentation.    Lungs are clear and Cardiac 2/6 SEM with nl rhythm and no gallop.  EKG is unchanged Patient did have pain in the office, was given a sublingual nitro and the pain was promptly relieved.  Imp: Chest pain relieved by nitro in a patient with known CAD.   Plan: Admit, RO MI, involve her cardiologist.  Per patient, it has been ~1year since last evaluation.       Review of Systems     Objective:   Physical Exam        Assessment & Plan:

## 2011-11-26 NOTE — H&P (View-Only) (Signed)
[name] is an 70 y.o. female.    PCP: [facility]  Chief Complaint: Back and chest pain  HPI: 70 yo female with DM and CAD presents from ED waiting room with subacute chest pain. Patient was in normal state of health until this morning. Woke up around 8 am as usual and noticed some mid-thoracic right back pain. This radiates to right rib margin and under right breast to mid-sternum. Worsened throughout the day. Has been constant, not alleviated by anything or worsened by anything including deep inspiration. Did not try nitroglycerin because she could not afford the prescription and doesn't have any at home. Has only taken her daily prescribed meds today including an aspirin. Denies dyspnea, diaphoresis, n/v/d, edema, palpitations. Denies any knowledge of exertion or straining, heavy lifting, falls or trauma.   She ate a hamburger for lunch at 11:00, then decided to present to ED for evaluation. Patient arrived around noon today to [facility] ED and was triaged in waiting room for several hours. She reports an EKG was performed, the she questioned the staff regarding how much longer and informed she would have to wait 4 more hours. Patient then decided to walk to [facility] for MD evaluation.   Has history of 2x PCI with angioplasty in 2001 following NSTEMI and more recently 2010 with ballooning of 80% diagonal of LAD. Follows with Dr. Nishan and is due for check up.   Review of Systems  See HPI otherwise negative.     Past Medical History  Diagnosis Date  . History of thyroid cancer   . ETD (eustachian tube dysfunction)   . CTS (carpal tunnel syndrome)   . Anemia   . Gastritis   . Postmenopausal   . Chest wall pain   . Hypothyroid   . Screening for malignant neoplasm of cervix   . Cor pulmonale   . Acute URI   . Hypertension, benign   . Diabetes mellitus, type II   . Hypercholesteremia     Past Surgical History  Procedure Date  . Angioplasty     PDA 90-40 %  . Thyroidectomy   . Breast  biopsy   . Cholecystectomy 08-23-2002  . Endometrial biopsy 02-20-1998  . Bartholin gland cyst excision 09-13-2004    Keratosis- no CA  . Ptca 10/23/2000 and 03/15/09  . Cardiac catheterization 03/15/2009    stenosis of diagonal branch of LAD- PTCA performed    Family History  Problem Relation Age of Onset  . Diabetes Mother     Deceased- hypoglycemic coma   . Hypertension Mother    Social History:  reports that she quit smoking about 30 years ago. Her smoking use included Cigarettes. She quit smokeless tobacco use about 17 years ago. Her smokeless tobacco use included Chew. She reports that she does not drink alcohol. Her drug history not on file.  Allergies:  Allergies  Allergen Reactions  . Codeine     REACTION: GI Intolerance  . Sulfamethoxazole W/Trimethoprim     REACTION: Gi intolerence    Medications Prior to Admission  Medication Dose Route Frequency Provider Last Rate Last Dose  . nitroGLYCERIN (NITROSTAT) SL tablet 0.4 mg  0.4 mg Sublingual Once Larine Fielding, MD   0.4 mg at 11/25/11 1653   Medications Prior to Admission  Medication Sig Dispense Refill  . amLODipine (NORVASC) 10 MG tablet TAKE ONE TABLET BY MOUTH EVERY DAY  90 tablet  0  . aspirin 81 MG chewable tablet Chew 81 mg by mouth daily.        .   carvedilol (COREG) 25 MG tablet Take 1 tablet (25 mg total) by mouth 2 (two) times daily.  60 tablet  11  . ferrous sulfate 325 (65 FE) MG tablet Take 325 mg by mouth 2 (two) times daily.        . fluticasone (FLONASE) 50 MCG/ACT nasal spray Place 2 sprays into the nose daily.       . glipiZIDE (GLUCOTROL) 10 MG 24 hr tablet Take 10 mg by mouth daily.        . hydrochlorothiazide 25 MG tablet Take 1 tablet (25 mg total) by mouth daily.  30 tablet  11  . insulin glargine (LANTUS) 100 UNIT/ML injection Inject 10 Units into the skin every morning.        . isosorbide mononitrate (IMDUR) 60 MG 24 hr tablet Take 60 mg by mouth daily.        . levothyroxine (SYNTHROID, LEVOTHROID)  50 MCG tablet TAKE ONE TABLET BY MOUTH EVERY DAY  90 tablet  0  . lisinopril (PRINIVIL,ZESTRIL) 40 MG tablet Take 40 mg by mouth daily.        . metFORMIN (GLUCOPHAGE) 1000 MG tablet Take 1,000 mg by mouth 2 (two) times daily.        . nitroGLYCERIN (NITROSTAT) 0.4 MG SL tablet Place 1 tablet (0.4 mg total) under the tongue every 5 (five) minutes as needed for chest pain.  90 tablet  3    No results found for this or any previous visit (from the past 48 hour(s)). No results found.  ROS  There were no vitals taken for this visit. Physical Exam   Objective:     Physical Exam  Vitals reviewed. Wt 165.8, P 76, BP 146/77  Constitutional: She is oriented to person, place, and time. She appears well-developed and well-nourished. No distress.  HENT:  Head: Normocephalic and atraumatic.  Eyes: EOM are normal. Pupils are equal, round, and reactive to light.  Cardiovascular: Normal rate, regular rhythm, normal heart sounds and intact distal pulses. Exam reveals no gallop.  No murmur heard.  Pulmonary/Chest: Effort normal and breath sounds normal. No respiratory distress. She has no wheezes. She has no rales. She exhibits no tenderness.  Abdominal: Soft. Bowel sounds are normal. She exhibits no distension. There is no tenderness.  Musculoskeletal: She exhibits no edema and no tenderness.  No TTP in paraspinal musculature. Abduction of right arm causing increased "pulling" in shoulder and neck area.  Neurological: She is alert and oriented to person, place, and time. No cranial nerve deficit. Coordination normal.  Skin: No rash noted. She is not diaphoretic.  Psychiatric: She has a normal mood and affect.   12 lead EKG: NSR 67 bpm, PR 146, Qtc 399, No change in twi from previous study.   Assessment/Plan  70 yo female with DM and known CAD presents with atypical chest pain relieved by nitroglycerin in clinic.  1. Chest pain. Atypical location with right back and rib/sternal location and  nonexertional; however, patient has known CAD and this is relieved by nitroglycerin which is concerning for cardiac etiology. Pattern also seems just as likely of musculoskeletal etiology. Office EKG has no remarkable changes from previous studies. Patient will be admitted to telemetry unit for observation and ischemia rule out. Will draw enzyme panel, CBC, INR/PTT, CMET on arrival. Cycle cardiac enzymes q6 hr x 3. Repeat EKG in am. Have not ordered additional pain medication at this time so that MD will be notified for any recurrence. Will hold on starting   heparin drip. Patient has already taken her aspirin today. Would consult her primary cardiologist (Dr. Nishan with [facility]) in the am if pain returns or deemed beneficial by primary team.   2. HTN. Will continue home antihypertensives as her BP is slightly elevated. Check CMET with labs.  3. DM. Will continue home dose lantus 10 units and sliding scale insulin for meals. Carb modified diet.  4. Hypothyroidism. Continue home thyroid replacement.  5. PPX. Heparin sq q8hr.   6. Dispo. Pending evaluation. Full code. Patient sent by private car to admission office.   Case d/w Dr. Hensel. I have called [facility] bed control and confirmed that telemetry bed is available for direct admission.    Gavin Telford 11/25/2011, 5:07 PM    

## 2011-11-27 LAB — BASIC METABOLIC PANEL
BUN: 12 mg/dL (ref 6–23)
Chloride: 101 mEq/L (ref 96–112)
GFR calc Af Amer: >90 mL/min (ref >90)
Potassium: 4.7 mEq/L (ref 3.5–5.1)

## 2011-11-27 LAB — GLUCOSE, CAPILLARY
Glucose-Capillary: 198 mg/dL — ABNORMAL HIGH (ref 70–99)
Glucose-Capillary: 172 mg/dL — ABNORMAL HIGH (ref 70–99)

## 2011-11-27 MED ORDER — INSULIN GLARGINE 100 UNIT/ML ~~LOC~~ SOLN: 10.0000 [IU] | Freq: Every day | SUBCUTANEOUS | Status: DC

## 2011-11-27 NOTE — Progress Notes (Signed)
PGY-1 Daily Progress Note Family Medicine Teaching Service D. Piloto Rolene Arbour, MD Service Pager: [phone]   Subjective:  Went to cath yesterday with negative results for obstructive coronary disease.  No events overnight. No pain reported.  Objective:  Temp:  98.6 F  Pulse Rate:  74   Resp:  17   BP:  127/57 mmHg  SpO2: 100 %  Weight: 164 lb 3.9 oz   Physical exam: Gen:  NAD HEENT: Moist mucous membranes CV: Regular rate and rhythm, no murmurs rubs or gallops. PULM: Clear to auscultation bilaterally. No wheezes/rales/rhonchi. ABD: Soft, non tender, non distended, normal bowel sounds. EXT: No edema.  Neuro: Alert and oriented x3. No focalization   Labs and imaging:   BMET Lab 11/26/11 0148 11/25/11 1949  NA 140 141  K 3.2* 3.3*  CL 102 101  CO2 28 29  BUN 18 18  CREATININE 0.83 0.81  LABGLOM -- --  GLUCOSE 206* --  CALCIUM 9.3 10.1   Results for orders placed during the hospital encounter of 11/25/11 (from the past 24 hour(s))  GLUCOSE, CAPILLARY     Status: Abnormal   Collection Time   11/26/11 11:18 AM      Component Value    Glucose-Capillary 237 (*)   GLUCOSE, CAPILLARY     Status: Abnormal   Collection Time   11/26/11  9:49 PM      Component Value    Glucose-Capillary 278 (*)    Catheterization Final Conclusions:  1. Minor nonobstructive atherosclerotic coronary disease. The previous angioplasty sites are still patent.  2. Normal left ventricular function.  Recommendations: Would pursue evaluation of noncardiac causes of chest pain.   Assessment and Plan: 70 yo female with DM and known CAD its admitted for atypical chest pain.   1. Chest pain. Atypical location. Cardiac enzymes negatives times 3, and EKG with no changes since 2011.The pain seems of musculoskeletal etiology, nevertheless we consulted with her cardiologist Dr. Eden Emms who evaluated her yesterday and decided to schedule her for cath. Catheterism was performed with only minor  nonobstructive CAD. Patient pain free today.  2. HTN. Controlled  -Will continue home antihypertensives and monitor BP.   3. Hypokalemia: .  KCL yesterday  2 doses. Bmet order was cancelled yesterday. We will repeat today before discharge.  4. DM. A1C no at goal. Glucose 206 and mid 100's  -Will continue lantus 10 units and sliding scale insulin for meals. Carb modified diet.   5. Hypothyroidism. TSH normal. 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 Postoperative change is noted about the thyroid bed.  Correlation with laboratory findings and thyroid ultrasound.  - Continue home thyroid replacement. - U/S and evaluation as outpatient.  FEN/GI: CHO modified diet. IV Saline Lock. PPX. Heparin sq q8hr.  Dispo. Possible discharge home today.     D. Piloto Rolene Arbour, MD PGY1, Family Medicine Teaching Service [phone] 11/27/2011

## 2011-11-27 NOTE — Progress Notes (Signed)
Patient ID: [name], female   DOB: 1941-12-16, 70 y.o.   MRN: [id number] @ Subjective:  Denies SSCP, palpitations or Dyspnea Wants to go home  Objective:  Filed Vitals:   11/26/11 2046 11/27/11 0021 11/27/11 0447 11/27/11 0500  BP: 106/49 119/49 127/57   Pulse: 62 67 74   Temp: 98.7 F (37.1 C) 98.5 F (36.9 C) 98.6 F (37 C)   TempSrc: Oral Oral Oral   Resp: 17     Height:      Weight:    74.5 kg (164 lb 3.9 oz)  SpO2: 100% 100% 100%     Intake/Output from previous day:  Intake/Output Summary (Last 24 hours) at 11/27/11 0949 Last data filed at 11/27/11 0648  Gross per 24 hour  Intake    735 ml  Output    978 ml  Net   -243 ml    Physical Exam: General appearance: alert and no distress Neck: no adenopathy, no carotid bruit, no JVD, supple, symmetrical, trachea midline and thyroid not enlarged, symmetric, no tenderness/mass/nodules Lungs: clear to auscultation bilaterally Heart: regular rate and rhythm, S1, S2 normal, no murmur, click, rub or gallop Abdomen: soft, non-tender; bowel sounds normal; no masses,  no organomegaly Extremities: extremities normal, atraumatic, no cyanosis or edema Pulses: 2+ and symmetric Cath sigt right femoral artery normal  Lab Results: Basic Metabolic Panel:  Basename 11/26/11 0148 11/25/11 1949  NA 140 141  K 3.2* 3.3*  CL 102 101  CO2 28 29  GLUCOSE 206* 85  BUN 18 18  CREATININE 0.83 0.81  CALCIUM 9.3 10.1  MG -- 1.9  PHOS -- 3.0   Liver Function Tests:  Basename 11/25/11 1949  AST 18  ALT 20  ALKPHOS 82  BILITOT 0.4  PROT 7.8  ALBUMIN 3.9   No results found for this basename: LIPASE:2,AMYLASE:2 in the last 72 hours CBC:  Basename 11/25/11 1949  WBC 6.3  NEUTROABS --  HGB 11.2*  HCT 34.4*  MCV 81.7  PLT 167   Cardiac Enzymes:  Basename 11/26/11 0800 11/26/11 0148 11/25/11 1950  CKTOTAL 84 79 102  CKMB 2.2 2.3 2.8  CKMBINDEX -- -- --  TROPONINI <0.30 <0.30 <0.30   BNP: No results found for  this basename: POCBNP:3 in the last 72 hours D-Dimer: No results found for this basename: DDIMER:2 in the last 72 hours Hemoglobin A1C:  Basename 11/25/11 1949  HGBA1C 8.6*   Fasting Lipid Panel: No results found for this basename: CHOL,HDL,LDLCALC,TRIG,CHOLHDL,LDLDIRECT in the last 72 hours Thyroid Function Tests:  Basename 11/25/11 1949  TSH 0.742  T4TOTAL --  T3FREE --  THYROIDAB --   Anemia Panel: No results found for this basename: VITAMINB12,FOLATE,FERRITIN,TIBC,IRON,RETICCTPCT in the last 72 hours  Imaging: Portable Chest 1 View  11/25/2011  *RADIOLOGY REPORT*  Clinical Data: Chest pain; history of diabetes.  PORTABLE CHEST - 1 VIEW  Comparison: Chest radiograph performed 03/13/2009  Findings: The lungs are well-aerated and clear.  There is no evidence of focal opacification, pleural effusion or pneumothorax.  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  Postoperative change is noted about the thyroid bed.  Thyroid ultrasound and correlation with laboratory findings would be helpful for further evaluation.  The cardiomediastinal silhouette is within normal limits.  No acute osseous abnormalities are seen.  IMPRESSION:  1.  No acute cardiopulmonary process seen. 2.  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 Postoperative change is noted about the thyroid bed.  Correlation with laboratory findings and thyroid ultrasound would be helpful for further evaluation, when and as deemed clinically appropriate.  Original Report Authenticated By: Tonia Ghent, M.D.    Cardiac Studies:  Telemetry:  NSR no arrythmia  Medications:     . amLODipine  10 mg Oral Daily  . aspirin      . aspirin  324 mg Oral Pre-Cath  . aspirin  81 mg Oral Daily  . carvedilol  25 mg Oral BID WC  . diazepam  5 mg Oral NOW  . ferrous sulfate  325 mg Oral BID  . heparin      .  heparin      . hydrochlorothiazide  25 mg Oral Daily  . insulin aspart  0-15 Units Subcutaneous TID WC  . insulin glargine  10 Units Subcutaneous QHS  . isosorbide mononitrate  60 mg Oral Daily  . levothyroxine  50 mcg Oral QAC breakfast  . lidocaine      . lisinopril  40 mg Oral Daily  . midazolam      . nitroGLYCERIN      . pneumococcal 23 valent vaccine  0.5 mL Intramuscular Tomorrow-1000  . potassium chloride  40 mEq Oral BID  . verapamil      . DISCONTD: heparin  5,000 Units Subcutaneous Q8H  . DISCONTD: potassium chloride SA      . DISCONTD: potassium chloride  40 mEq Oral BID  . DISCONTD: sodium chloride  3 mL Intravenous Q12H       . sodium chloride      Assessment/Plan:  Chest Pain:  No restenosis of D1 or PDA on cath  Continue medical Rx.  Ok to D/C home  F/U with me 6 months DM:  Needs better control F/U [facility]  Charlton Haws 11/27/2011, 9:49 AM

## 2011-11-27 NOTE — Progress Notes (Signed)
Subjective: The patient continues to complain of 5/10 chest pain, tightness. This is helped by the morphine and nitroglycerin. Objective: Filed Vitals:   11/26/11 2046 11/27/11 0021 11/27/11 0447 11/27/11 0500  BP: 106/49 119/49 127/57   Pulse: 62 67 74   Temp: 98.7 F (37.1 C) 98.5 F (36.9 C) 98.6 F (37 C)   TempSrc: Oral Oral Oral   Resp: 17     Height:      Weight:    164 lb 3.9 oz (74.5 kg)  SpO2: 100% 100% 100%    Weight change:   Intake/Output Summary (Last 24 hours) at 11/27/11 0809 Last data filed at 11/27/11 1610  Gross per 24 hour  Intake   1095 ml  Output    978 ml  Net    117 ml    General: Alert, awake, oriented x3, in no acute distress.  HEENT: No bruits, no goiter.  Heart: Regular rate and rhythm, without murmurs, rubs, gallops.  Lungs: CTA bilaterally Abdomen: Soft, nontender, nondistended, positive bowel sounds.  Neuro: Grossly intact, nonfocal.   Lab Results:  [facility] 11/26/11 0148 11/25/11 1949  NA 140 141  K 3.2* 3.3*  CL 102 101  CO2 28 29  GLUCOSE 206* 85  BUN 18 18  CREATININE 0.83 0.81  CALCIUM 9.3 10.1  MG -- 1.9  PHOS -- 3.0    Basename 11/25/11 1949  AST 18  ALT 20  ALKPHOS 82  BILITOT 0.4  PROT 7.8  ALBUMIN 3.9   No results found for this basename: LIPASE:2,AMYLASE:2 in the last 72 hours  Basename 11/25/11 1949  WBC 6.3  NEUTROABS --  HGB 11.2*  HCT 34.4*  MCV 81.7  PLT 167    Basename 11/26/11 0800 11/26/11 0148 11/25/11 1950  CKTOTAL 84 79 102  CKMB 2.2 2.3 2.8  CKMBINDEX -- -- --  TROPONINI <0.30 <0.30 <0.30   No results found for this basename: POCBNP:3 in the last 72 hours No results found for this basename: DDIMER:2 in the last 72 hours  Basename 11/25/11 1949  HGBA1C 8.6*   No results found for this basename: CHOL:2,HDL:2,LDLCALC:2,TRIG:2,CHOLHDL:2,LDLDIRECT:2 in the last 72 hours  Basename 11/25/11 1949  TSH 0.742  T4TOTAL --  T3FREE --  THYROIDAB --   Studies/Results: Portable Chest 1  View 11/25/2011  Findings: The lungs are well-aerated and clear.  There is no evidence of focal opacification, pleural effusion or pneumothorax.  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  Postoperative change is noted about the thyroid bed.  Thyroid ultrasound and correlation with laboratory findings would be helpful for further evaluation.  The cardiomediastinal silhouette is within normal limits.  No acute osseous abnormalities are seen.   IMPRESSION:  1.  No acute cardiopulmonary process seen. 2.  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 Postoperative change is noted about the thyroid bed.  Correlation with laboratory findings and thyroid ultrasound would be helpful for further evaluation, when and as deemed clinically appropriate.  Original Report Authenticated By: Tonia Ghent, M.D.    Medications:  I have reviewed the patient's current medications. Scheduled:   . amLODipine  10 mg Oral Daily  . aspirin      . aspirin  324 mg Oral Pre-Cath  . aspirin  81 mg Oral Daily  . carvedilol  25 mg Oral BID WC  . diazepam  5 mg Oral NOW  .  ferrous sulfate  325 mg Oral BID  . heparin      . heparin      . hydrochlorothiazide  25 mg Oral Daily  . insulin aspart  0-15 Units Subcutaneous TID WC  . insulin glargine  10 Units Subcutaneous QHS  . isosorbide mononitrate  60 mg Oral Daily  . levothyroxine  50 mcg Oral QAC breakfast  . lidocaine      . lisinopril  40 mg Oral Daily  . midazolam      . nitroGLYCERIN      . pneumococcal 23 valent vaccine  0.5 mL Intramuscular Tomorrow-1000  . potassium chloride  40 mEq Oral BID  . verapamil      . DISCONTD: heparin  5,000 Units Subcutaneous Q8H  . DISCONTD: potassium chloride SA      . DISCONTD: potassium chloride  40 mEq Oral BID  . DISCONTD: sodium chloride  3 mL Intravenous Q12H     Patient Active Hospital Problem  List: Chest pain (11/26/2011)   Assessment/ Plan: Ms. Ledesma is a 70 year old female with a history of coronary artery disease, last intervention in 2010. Her cardiac enzymes were negative. Her cardiac catheterization showed nonobstructive coronary artery disease. She can f/u with Dr Eden Emms on Jan 8th, 10:15am.   Hypokalemia: Her potassium was 3.2 on 11/26/2011. A repeat Bmet has been ordered, but we will leave these and other medical issues to the primary care team.   LOS: 2 days   [name] 11/27/2011, 8:09 AM

## 2011-11-27 NOTE — Progress Notes (Signed)
PCP NOTE 8:17 AM [name]   Patient seen and examined. Agree with Inpatient team management.  Head Cardiac Cath today, no stenting or angioplasty performed. Patient c/o: nothing. Original right sided pain resolved.  Filed Vitals:   11/26/11 2046 11/27/11 0021 11/27/11 0447 11/27/11 0500  BP: 106/49 119/49 127/57   Pulse: 62 67 74   Temp: 98.7 F (37.1 C) 98.5 F (36.9 C) 98.6 F (37 C)   TempSrc: Oral Oral Oral   Resp: 17     Height:      Weight:    164 lb 3.9 oz (74.5 kg)  SpO2: 100% 100% 100%     PE: see Primary team note Heart - Regular rate and rhythm.  No murmurs, gallops or rubs.    Lungs:  Normal respiratory effort, chest expands symmetrically. Lungs are clear to auscultation, no crackles or wheezes. Abdomen: soft and non-tender without masses, organomegaly or hernias noted.  No guarding or rebound  A/P:  Problem list and management per primary team  1. Social visit - discussed care and comfort with patient - patient doing well. - likely home today  - Will follow with you  Thank you for the excellent care of this patient.  [name] MD

## 2011-11-27 NOTE — Progress Notes (Signed)
Inpatient Diabetes Program Recommendations  AACE/ADA: New Consensus Statement on Inpatient Glycemic Control (2009)  Target Ranges:  Prepandial:   less than 140 mg/dL      Peak postprandial:   less than 180 mg/dL (1-2 hours)      Critically ill patients:  140 - 180 mg/dL   Reason: Elevated post-prandial CBGs >200   Inpatient Diabetes Program Recommendations Insulin - Meal Coverage: Add 4 units Novolog with meals

## 2011-11-27 NOTE — Progress Notes (Signed)
Family Medicine Teaching Service Attending Note  I interviewed and examined patient [name] and reviewed their tests and x-rays.  I discussed with Dr. [name] and reviewed their note for today.  I agree with their assessment and plan.     Additionally  She feels well.  Ok for discharge.  Maximize medical therapy as otpt

## 2011-11-28 NOTE — Discharge Summary (Signed)
Physician Discharge Summary  Patient ID: [name] [id number] 09-Feb-1941 70 y.o.  Admit date: 11/25/2011 Discharge date: 11/28/2011  PCP: [name], MD  Consultants: Eden Emms. MD  Discharge Diagnosis:  1.  Chest pain r/o cardiac etiology. 2. Hx of CAD s/p angioplasty in 2010. 3. DM T2 4. Hypothyroidism. 5. HTN  Hospital Course Pt admitted for chest pain  1. Chest pain. Atypical location. Cardiac enzymes negatives times 3, and EKG with no changes since 2011.The pain seems of musculoskeletal etiology, nevertheless we consulted with her cardiologist Dr. Eden Emms who evaluated her and decided to schedule her for cath. Catheterism was performed with only minor nonobstructive CAD. Pain resolved completely by the time of discharge. 2. HTN. Controlled on home medications during hospitalization. 3. Hypokalemia: resolved with 2 doses of KCL 4. DM. A1C no at goal. During hospitalization on lantus 10 and SSI pt glucose was around low 200's and mid 100's. Pt admitted not compliant since April/12 with her Diabetes regimen and stated that when she was taking it in the past her A1C was around 7's. Pt is discharge home with same home meds of Metformin, Glipizide and Lantus 10 units. 5. Hypothyroidism. TSH normal. 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 Postoperative change is noted about the thyroid bed. We recommend U/S and evaluation as outpatient.    Procedures/Imaging:  Portable Chest 1 View IMPRESSION:  1.  No acute cardiopulmonary process seen. 2.  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 Postoperative change is noted about the thyroid bed.  Correlation with laboratory findings and thyroid ultrasound would be helpful for further evaluation, when and as deemed clinically appropriate.  Original Report Authenticated  By: Tonia Ghent, M.D.   Labs  CBC  Lab 11/25/11 1949  WBC 6.3  HGB 11.2*  HCT 34.4*  PLT 167   BMET  Lab 11/27/11 0901 11/26/11 0148 11/25/11 1949  NA 136 140 141  K 4.7 3.2* 3.3*  CL 101 102 101  CO2 28 28 29   BUN 12 18 18   CREATININE 0.79 0.83 0.81  CALCIUM 9.7 9.3 10.1  PROT -- -- 7.8  BILITOT -- -- 0.4  ALKPHOS -- -- 82  ALT -- -- 20  AST -- -- 18  GLUCOSE 253* 206* 85   MAGNESIUM     Status: Normal   Collection Time   11/25/11  7:49 PM      Component Value Range Comment   Magnesium 1.9  1.5 - 2.5 (mg/dL)   PHOSPHORUS     Status: Normal   Collection Time   11/25/11  7:49 PM      Component Value Range Comment   Phosphorus 3.0  2.3 - 4.6 (mg/dL)   PROTIME-INR     Status: Normal   Collection Time   11/25/11  7:49 PM      Component Value Range Comment   Prothrombin Time 13.6  11.6 - 15.2 (seconds)    INR 1.02  0.00 - 1.49    TSH     Status: Normal   Collection Time   11/25/11  7:49 PM      Component Value Range Comment   TSH 0.742  0.350 - 4.500 (uIU/mL)   CARDIAC PANEL(CRET KIN+CKTOT+MB+TROPI)     Status: Abnormal   Collection Time   11/25/11  7:50 PM      Component  Value Range Comment   Total CK 102  7 - 177 (U/L)    CK, MB 2.8  0.3 - 4.0 (ng/mL)    Troponin I <0.30  <0.30 (ng/mL)    Relative Index 2.7 (*) 0.0 - 2.5    CARDIAC PANEL(CRET KIN+CKTOT+MB+TROPI)     Status: Normal   Collection Time   11/26/11  1:48 AM      Component Value Range Comment   Total CK 79  7 - 177 (U/L)    CK, MB 2.3  0.3 - 4.0 (ng/mL)    Troponin I <0.30  <0.30 (ng/mL)    Relative Index RELATIVE INDEX IS INVALID  0.0 - 2.5    CARDIAC PANEL(CRET KIN+CKTOT+MB+TROPI)     Status: Normal   Collection Time   11/26/11  8:00 AM      Component Value Range Comment   Total CK 84  7 - 177 (U/L)    CK, MB 2.2  0.3 - 4.0 (ng/mL)    Troponin I <0.30  <0.30 (ng/mL)    Relative Index RELATIVE INDEX IS INVALID  0.0 - 2.5        Patient condition at time of discharge/disposition: stable   Disposition-home   Follow up issues: 1. A1C 8.6. Pt not compliant with tx since 4/12.  2.  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 Postoperative change is noted about the thyroid bed.  Correlation with laboratory findings and thyroid ultrasound would be helpful for further evaluation. 3. Last lipid profile in 2011(normal). Evaluate and consider statins as preventive for CAD.   Discharge follow up: Discharge Orders    Future Appointments: Provider: Department: Dept Phone: Center:   12/04/2011 10:15 AM [name] [facility] [phone] [facility]   12/31/2011 10:15 AM Wendall Stade, MD [facility] [phone] [facility]      Discharge Medications  [name], [name]  Home Medication Instructions [id number]   Printed on:11/28/11 0053  Medication Information                    aspirin 81 MG chewable tablet Chew 81 mg by mouth daily.             ferrous sulfate 325 (65 FE) MG tablet Take 325 mg by mouth 2 (two) times daily.             fluticasone (FLONASE) 50 MCG/ACT nasal spray Place 2 sprays into the nose daily.            glipiZIDE (GLUCOTROL) 10 MG 24 hr tablet Take 10 mg by mouth daily.             isosorbide mononitrate (IMDUR) 60 MG 24 hr tablet Take 60 mg by mouth daily.             lisinopril (PRINIVIL,ZESTRIL) 40 MG tablet Take 40 mg by mouth daily.             metFORMIN (GLUCOPHAGE) 1000 MG tablet Take 1,000 mg by mouth 2 (two) times daily.             amLODipine (NORVASC) 10 MG tablet TAKE ONE TABLET BY MOUTH EVERY DAY           levothyroxine (SYNTHROID, LEVOTHROID) 50 MCG tablet TAKE ONE TABLET BY MOUTH EVERY DAY           carvedilol (COREG) 25 MG tablet Take  1 tablet (25 mg total) by mouth 2 (two) times daily.           hydrochlorothiazide 25 MG tablet Take 1 tablet (25 mg total) by mouth daily.             nitroGLYCERIN (NITROSTAT) 0.4 MG SL tablet Place 1 tablet (0.4 mg total) under the tongue every 5 (five) minutes as needed for chest pain.           acetaminophen (TYLENOL) 325 MG tablet Take 2 tablets (650 mg total) by mouth every 6 (six) hours as needed (for shoulder pain).           insulin glargine (LANTUS) 100 UNIT/ML injection Inject 10 Units into the skin at bedtime.              D. Piloto de Criselda Peaches, MD of [facility] 11/28/2011 12:53 AM

## 2011-11-29 NOTE — Discharge Summary (Signed)
I have reviewed this discharge summary and agree.    

## 2011-12-04 ENCOUNTER — Encounter: Payer: Self-pay | Admitting: Family Medicine

## 2011-12-04 ENCOUNTER — Ambulatory Visit (INDEPENDENT_AMBULATORY_CARE_PROVIDER_SITE_OTHER): Payer: Medicare Other | Admitting: Family Medicine

## 2011-12-04 VITALS — BP 118/62 | HR 84 | Temp 98.5°F | Ht 62.5 in | Wt 167.0 lb

## 2011-12-04 DIAGNOSIS — J309 Allergic rhinitis, unspecified: Secondary | ICD-10-CM | POA: Insufficient documentation

## 2011-12-04 DIAGNOSIS — Z09 Encounter for follow-up examination after completed treatment for conditions other than malignant neoplasm: Secondary | ICD-10-CM

## 2011-12-04 DIAGNOSIS — R0789 Other chest pain: Secondary | ICD-10-CM

## 2011-12-04 DIAGNOSIS — Z1211 Encounter for screening for malignant neoplasm of colon: Secondary | ICD-10-CM

## 2011-12-04 MED ORDER — NITROGLYCERIN 0.4 MG SL SUBL: 0.4000 mg | SUBLINGUAL_TABLET | SUBLINGUAL | Status: DC | PRN

## 2011-12-04 MED ORDER — LORATADINE-PSEUDOEPHEDRINE ER 10-240 MG PO TB24: 1.0000 | ORAL_TABLET | Freq: Every day | ORAL | Status: DC

## 2011-12-04 MED ORDER — ZOSTER VACCINE LIVE 19400 UNT/0.65ML ~~LOC~~ SOLR: 0.6500 mL | Freq: Once | SUBCUTANEOUS | Status: AC

## 2011-12-04 NOTE — Patient Instructions (Signed)
It was great to see you today!  Schedule an appointment to see me as needed.  Call me if you have any more chest pain.  I sent your medications to the pharmacy.  You will be called concerning your referral for a colonoscopy.  I also sent in your shingles vaccine.

## 2011-12-04 NOTE — Assessment & Plan Note (Signed)
Referred patient to GI for colon ca screening.

## 2011-12-04 NOTE — Assessment & Plan Note (Addendum)
Doing well at follow up. Has cards follow up in 6 months. Negative cardiac workup. No chest pain. Sent in rx for nitroglyerin - she knows to call me or cardiology if she needs to take this

## 2011-12-04 NOTE — Progress Notes (Signed)
  Subjective:    Patient ID: [name], female    DOB: 27-Dec-1940, 70 y.o.   MRN: [id number]  HPI 1. Allergic rhinitis Sneezing, coryza. No cough. No fever. No sick contacts. No sob. No chest pain. No sinus pressure. No teeth pain or sensitivity.  2. Hospital follow up s/p chest pain and cardiac cath. Negative cath. Negative troponins. Non-specific ekg changes. She responded to nitroglycerin and has a hx of stenting. She is asymptomatic at this time. Has not needed any more nitroglycerin. In retrospect she may have had a muscle sprain on her back because movement made the muscle pain worse.   Review of Systems See HPI    Objective:   Physical Exam Filed Vitals:   12/04/11 1041  BP: 118/62  Pulse: 84  Temp: 98.5 F (36.9 C)  TempSrc: Oral  Height: 5' 2.5" (1.588 m)  Weight: 167 lb (75.751 kg)   Lungs:  Normal respiratory effort, chest expands symmetrically. Lungs are clear to auscultation, no crackles or wheezes. Heart - Regular rate and rhythm.  No murmurs, gallops or rubs.    Extremities:  No cyanosis, edema, or deformity noted Throat: normal mucosa, no exudate, uvula midline, no redness Mouth - no lesions, mucous membranes are moist, no decaying teeth     Assessment & Plan:

## 2011-12-04 NOTE — Assessment & Plan Note (Signed)
claritin D. Do not suspect infectious etiology. Patient has these yearly.

## 2011-12-05 ENCOUNTER — Other Ambulatory Visit: Payer: Self-pay | Admitting: Sports Medicine

## 2011-12-05 NOTE — Telephone Encounter (Signed)
Refill request

## 2011-12-09 ENCOUNTER — Telehealth: Payer: Self-pay | Admitting: Family Medicine

## 2011-12-09 NOTE — Telephone Encounter (Signed)
Had a call from Friday about a referral, but there are no notes about this referral for a Colonoscopy done.  She would like to speak to someone about this.

## 2011-12-09 NOTE — Telephone Encounter (Signed)
[name] is calling because she is sure that [name] called her on Friday about a referral that she thinks is for a Colonoscopy.

## 2011-12-09 NOTE — Telephone Encounter (Signed)
Patient notified she needed to contact [facility] at [phone] before her colonoscopy could be scheduled.  Patient states understanding.  Ileana Ladd

## 2011-12-31 ENCOUNTER — Encounter: Payer: Self-pay | Admitting: Cardiovascular Disease

## 2011-12-31 ENCOUNTER — Ambulatory Visit (INDEPENDENT_AMBULATORY_CARE_PROVIDER_SITE_OTHER): Payer: Medicare Other | Admitting: Cardiovascular Disease

## 2011-12-31 DIAGNOSIS — E7800 Pure hypercholesterolemia, unspecified: Secondary | ICD-10-CM

## 2011-12-31 DIAGNOSIS — I2510 Atherosclerotic heart disease of native coronary artery without angina pectoris: Secondary | ICD-10-CM

## 2011-12-31 DIAGNOSIS — I10 Essential (primary) hypertension: Secondary | ICD-10-CM

## 2011-12-31 NOTE — Progress Notes (Signed)
Patient ID: [name], female   DOB: 02-Apr-1941, 71 y.o.   MRN: [id number] 23 year year-old female with history of coronary disease status post remote angioplasty of the PDA. She is also status post angioplasty of the first diagonal branch and 2010. She presents with symptoms of new chest pain.  Recent admission for chest pain.  Right radial cath by Dr [place] showed no significant disease.  Reviewed cath No complaints since D/C  Going to have F/U screening colonoscopy.    ROS: Denies fever, malais, weight loss, blurry vision, decreased visual acuity, cough, sputum, SOB, hemoptysis, pleuritic pain, palpitaitons, heartburn, abdominal pain, melena, lower extremity edema, claudication, or rash.  All other systems reviewed and negative  General: Affect appropriate Healthy:  appears stated age HEENT: normal Neck supple with no adenopathy JVP normal no bruits no thyromegaly Lungs clear with no wheezing and good diaphragmatic motion Heart:  S1/S2 no murmur,rub, gallop or click PMI normal Abdomen: benighn, BS positve, no tenderness, no AAA no bruit.  No HSM or HJR Distal pulses intact with no bruits No edema Neuro non-focal Skin warm and dry No muscular weakness Mild right radial bruising but palpable pulse   Current Outpatient Prescriptions  Medication Sig Dispense Refill  . amLODipine (NORVASC) 10 MG tablet TAKE ONE TABLET BY MOUTH EVERY DAY  90 tablet  0  . aspirin 81 MG chewable tablet Chew 81 mg by mouth daily.        . carvedilol (COREG) 25 MG tablet Take 1 tablet (25 mg total) by mouth 2 (two) times daily.  60 tablet  11  . ferrous sulfate 325 (65 FE) MG tablet Take 325 mg by mouth 2 (two) times daily.        . fluticasone (FLONASE) 50 MCG/ACT nasal spray Place 2 sprays into the nose daily.       [name] glipiZIDE (GLUCOTROL) 10 MG 24 hr tablet Take 10 mg by mouth daily.        . hydrochlorothiazide 25 MG tablet Take 1 tablet (25 mg total) by mouth daily.  30 tablet  11  . insulin  glargine (LANTUS) 100 UNIT/ML injection Inject 10 Units into the skin at bedtime.  3 mL  1  . isosorbide mononitrate (IMDUR) 60 MG 24 hr tablet TAKE ONE TABLET BY MOUTH EVERY DAY  30 tablet  6  . levothyroxine (SYNTHROID, LEVOTHROID) 50 MCG tablet TAKE ONE TABLET BY MOUTH EVERY DAY  90 tablet  0  . lisinopril (PRINIVIL,ZESTRIL) 40 MG tablet Take 40 mg by mouth daily.        [name] loratadine-pseudoephedrine (CLARITIN-D 24 HOUR) 10-240 MG per 24 hr tablet Take 1 tablet by mouth daily.  30 tablet  2  . metFORMIN (GLUCOPHAGE) 1000 MG tablet Take 1,000 mg by mouth 2 (two) times daily.        . nitroGLYCERIN (NITROSTAT) 0.4 MG SL tablet Place 1 tablet (0.4 mg total) under the tongue every 5 (five) minutes as needed for chest pain.  90 tablet  3   Current Facility-Administered Medications  Medication Dose Route Frequency Provider Last Rate Last Dose  . nitroGLYCERIN (NITROSTAT) SL tablet 0.4 mg  0.4 mg Sublingual Once Dayarmys Piloto, MD        Allergies  Codeine and Sulfamethoxazole w/trimethoprim  Electrocardiogram:  Assessment and Plan

## 2011-12-31 NOTE — Assessment & Plan Note (Signed)
Well controlled.  Continue current medications and low sodium Dash type diet.    

## 2011-12-31 NOTE — Assessment & Plan Note (Signed)
Cholesterol is at goal.  Continue current dose of statin and diet Rx.  No myalgias or side effects.  F/U  LFT's in 6 months. Lab Results  Component Value Date   LDLCALC 72 01/05/2010

## 2011-12-31 NOTE — Assessment & Plan Note (Signed)
Distant PDA/D1 PCI>  Recent cath with no stenosis.  Continue medical Rx

## 2011-12-31 NOTE — Patient Instructions (Signed)
Your physician wants you to follow-up in: YEAR WITH DR NISHAN  You will receive a reminder letter in the mail two months in advance. If you don't receive a letter, please call our office to schedule the follow-up appointment.  Your physician recommends that you continue on your current medications as directed. Please refer to the Current Medication list given to you today. 

## 2012-02-01 ENCOUNTER — Telehealth: Payer: Self-pay | Admitting: Family Medicine

## 2012-02-01 NOTE — Telephone Encounter (Signed)
Pt says she had a cold recently and now is having feeling of fluid behind ear.  No pain, drainage, fevers/chills.  Plans to come to office on Monday.  Asking what she can do for symptomatic relief.  Suggested Afrin temporarily and to go to urgent care if it becomes worse.

## 2012-02-10 ENCOUNTER — Other Ambulatory Visit: Payer: Self-pay | Admitting: Sports Medicine

## 2012-02-10 NOTE — Telephone Encounter (Signed)
Refill request

## 2012-02-13 ENCOUNTER — Other Ambulatory Visit: Payer: Self-pay | Admitting: Family Medicine

## 2012-02-13 MED ORDER — AMLODIPINE BESYLATE 10 MG PO TABS: 10.0000 mg | ORAL_TABLET | Freq: Every day | ORAL | Status: DC

## 2012-03-12 ENCOUNTER — Ambulatory Visit (INDEPENDENT_AMBULATORY_CARE_PROVIDER_SITE_OTHER): Payer: Medicare Other | Admitting: Family Medicine

## 2012-03-12 ENCOUNTER — Encounter: Payer: Self-pay | Admitting: Family Medicine

## 2012-03-12 VITALS — BP 169/80 | HR 105 | Temp 102.3°F | Ht 62.0 in | Wt 166.0 lb

## 2012-03-12 DIAGNOSIS — J029 Acute pharyngitis, unspecified: Secondary | ICD-10-CM

## 2012-03-12 MED ORDER — ACETAMINOPHEN 325 MG PO TABS
1000.0000 mg | ORAL_TABLET | Freq: Once | ORAL | Status: AC
  Administered 2012-03-12: 975 mg via ORAL

## 2012-03-12 NOTE — Patient Instructions (Signed)

## 2012-03-12 NOTE — Assessment & Plan Note (Signed)
Supportive care, given red flags for return.  Tylenol given in office today for fever

## 2012-03-12 NOTE — Progress Notes (Signed)
  Subjective:    Patient ID: [name], female    DOB: 1941-04-23, 71 y.o.   MRN: [id number]  HPI 1 day of sore throat  Last night started feeling warm, today very sore throat.  Now with some nausea.  NO diarrhea but did have episode 5 days ago.  Mild cough, no dyspnea.  Has been exposed to two grandsons with strep.  Feels chills, achy  Has not taken any of her blood pressure medicines.  Has not taken antipyretics   Review of Systems  See HPI      Objective:   Physical Exam GEN: Alert & Oriented, No acute distress, appears fatigued HEENT: /AT. EOMI, PERRLA, no conjunctival injection or scleral icterus.  Bilateral tympanic membranes intact without erythema or effusion. Right TM with small area bright red blood noted on TM patient denies qtips or trauma..  Nares without edema or rhinorrhea.  Oropharynx is with mild erythema but no edema or exudates.  No anterior or posterior cervical lymphadenopathy. CV:  Regular Rate & Rhythm, no murmur Respiratory:  Normal work of breathing, CTAB, no wheezes Abd:  + BS, soft, no tenderness to palpation         Assessment & Plan:

## 2012-05-20 ENCOUNTER — Other Ambulatory Visit: Payer: Self-pay | Admitting: Family Medicine

## 2012-05-20 MED ORDER — "INSULIN SYRINGE 31G X 5/16"" 1 ML MISC": 1.0000 "application " | Status: DC

## 2012-07-02 ENCOUNTER — Other Ambulatory Visit: Payer: Self-pay | Admitting: *Deleted

## 2012-07-06 MED ORDER — LISINOPRIL 40 MG PO TABS: 40.0000 mg | ORAL_TABLET | Freq: Every day | ORAL | Status: DC

## 2012-07-06 MED ORDER — METFORMIN HCL 1000 MG PO TABS: 1000.0000 mg | ORAL_TABLET | Freq: Two times a day (BID) | ORAL | Status: DC

## 2012-08-19 ENCOUNTER — Other Ambulatory Visit: Payer: Self-pay | Admitting: *Deleted

## 2012-08-19 MED ORDER — ISOSORBIDE MONONITRATE ER 60 MG PO TB24: 60.0000 mg | ORAL_TABLET | Freq: Every day | ORAL | Status: DC

## 2012-09-14 ENCOUNTER — Observation Stay (HOSPITAL_COMMUNITY)
Admission: EM | Admit: 2012-09-14 | Discharge: 2012-09-15 | Disposition: A | Discharge status: Home / Self Care | Payer: Medicare Other | Attending: Family Medicine | Admitting: Family Medicine

## 2012-09-14 ENCOUNTER — Emergency Department (HOSPITAL_COMMUNITY): Payer: Medicare Other

## 2012-09-14 ENCOUNTER — Encounter (HOSPITAL_COMMUNITY): Payer: Self-pay | Admitting: *Deleted

## 2012-09-14 DIAGNOSIS — R0789 Other chest pain: Secondary | ICD-10-CM

## 2012-09-14 DIAGNOSIS — Z23 Encounter for immunization: Secondary | ICD-10-CM | POA: Insufficient documentation

## 2012-09-14 DIAGNOSIS — E1129 Type 2 diabetes mellitus with other diabetic kidney complication: Secondary | ICD-10-CM | POA: Diagnosis present

## 2012-09-14 DIAGNOSIS — E039 Hypothyroidism, unspecified: Secondary | ICD-10-CM | POA: Diagnosis present

## 2012-09-14 DIAGNOSIS — M25519 Pain in unspecified shoulder: Secondary | ICD-10-CM

## 2012-09-14 DIAGNOSIS — I10 Essential (primary) hypertension: Secondary | ICD-10-CM | POA: Diagnosis present

## 2012-09-14 DIAGNOSIS — E7800 Pure hypercholesterolemia, unspecified: Secondary | ICD-10-CM | POA: Diagnosis present

## 2012-09-14 DIAGNOSIS — E119 Type 2 diabetes mellitus without complications: Secondary | ICD-10-CM | POA: Diagnosis present

## 2012-09-14 DIAGNOSIS — E785 Hyperlipidemia, unspecified: Secondary | ICD-10-CM | POA: Insufficient documentation

## 2012-09-14 DIAGNOSIS — I2510 Atherosclerotic heart disease of native coronary artery without angina pectoris: Secondary | ICD-10-CM | POA: Diagnosis present

## 2012-09-14 DIAGNOSIS — R079 Chest pain, unspecified: Secondary | ICD-10-CM | POA: Diagnosis present

## 2012-09-14 HISTORY — DX: Atherosclerotic heart disease of native coronary artery without angina pectoris: I25.10

## 2012-09-14 LAB — CBC WITH DIFFERENTIAL/PLATELET
Eosinophils Relative: 2 % (ref 0–5)
HCT: 37.1 % (ref 36.0–46.0)
Hemoglobin: 12.1 g/dL (ref 12.0–15.0)
Lymphocytes Relative: 39 % (ref 12–46)
Lymphs Abs: 2.6 10*3/uL (ref 0.7–4.0)
MCV: 80.8 fL (ref 78.0–100.0)
Platelets: 185 10*3/uL (ref 150–400)
RBC: 4.59 MIL/uL (ref 3.87–5.11)
WBC: 6.7 10*3/uL (ref 4.0–10.5)

## 2012-09-14 LAB — COMPREHENSIVE METABOLIC PANEL
ALT: 29 U/L (ref 0–35)
ALT: 28 U/L (ref 0–35)
AST: 28 U/L (ref 0–37)
Albumin: 3.6 g/dL (ref 3.5–5.2)
Alkaline Phosphatase: 96 U/L (ref 39–117)
Alkaline Phosphatase: 84 U/L (ref 39–117)
CO2: 30 mEq/L (ref 19–32)
Calcium: 10.3 mg/dL (ref 8.4–10.5)
GFR calc Af Amer: >90 mL/min (ref >90)
GFR calc non Af Amer: 86 mL/min — ABNORMAL LOW (ref >90)
Glucose, Bld: 206 mg/dL — ABNORMAL HIGH (ref 70–99)
Potassium: 3.2 mEq/L — ABNORMAL LOW (ref 3.5–5.1)
Sodium: 140 mEq/L (ref 135–145)
Sodium: 141 mEq/L (ref 135–145)
Total Protein: 7.6 g/dL (ref 6.0–8.3)

## 2012-09-14 LAB — HEMOGLOBIN A1C
Hgb A1c MFr Bld: 10 % — ABNORMAL HIGH (ref <5.7)
Mean Plasma Glucose: 240 mg/dL — ABNORMAL HIGH (ref <117)

## 2012-09-14 MED ORDER — ASPIRIN 81 MG PO CHEW: 324.0000 mg | CHEWABLE_TABLET | ORAL | Status: DC

## 2012-09-14 MED ORDER — ONDANSETRON HCL 4 MG/2ML IJ SOLN: 4.0000 mg | Freq: Four times a day (QID) | INTRAMUSCULAR | Status: DC | PRN

## 2012-09-14 MED ORDER — LISINOPRIL 20 MG PO TABS
20.0000 mg | ORAL_TABLET | Freq: Every day | ORAL | Status: DC
  Administered 2012-09-14 – 2012-09-15 (×2): 20 mg via ORAL
  Filled 2012-09-14 (×2): qty 1

## 2012-09-14 MED ORDER — INFLUENZA VIRUS VACC SPLIT PF IM SUSP
0.5000 mL | Freq: Once | INTRAMUSCULAR | Status: AC
  Administered 2012-09-15: 0.5 mL via INTRAMUSCULAR
  Filled 2012-09-14: qty 0.5

## 2012-09-14 MED ORDER — SODIUM CHLORIDE 0.9 % IJ SOLN
3.0000 mL | Freq: Two times a day (BID) | INTRAMUSCULAR | Status: DC
  Administered 2012-09-14 – 2012-09-15 (×3): 3 mL via INTRAVENOUS

## 2012-09-14 MED ORDER — SODIUM CHLORIDE 0.9 % IV SOLN: 250.0000 mL | INTRAVENOUS | Status: DC | PRN

## 2012-09-14 MED ORDER — AMLODIPINE BESYLATE 10 MG PO TABS
10.0000 mg | ORAL_TABLET | Freq: Every day | ORAL | Status: DC
  Administered 2012-09-14 – 2012-09-15 (×2): 10 mg via ORAL
  Filled 2012-09-14 (×2): qty 1

## 2012-09-14 MED ORDER — SIMVASTATIN 20 MG PO TABS
20.0000 mg | ORAL_TABLET | Freq: Every day | ORAL | Status: DC
  Administered 2012-09-14: 20 mg via ORAL
  Filled 2012-09-14 (×2): qty 1

## 2012-09-14 MED ORDER — NITROGLYCERIN 0.4 MG SL SUBL: 0.4000 mg | SUBLINGUAL_TABLET | SUBLINGUAL | Status: DC | PRN

## 2012-09-14 MED ORDER — CARVEDILOL 25 MG PO TABS
25.0000 mg | ORAL_TABLET | Freq: Two times a day (BID) | ORAL | Status: DC
  Administered 2012-09-14 – 2012-09-15 (×2): 25 mg via ORAL
  Filled 2012-09-14 (×4): qty 1

## 2012-09-14 MED ORDER — POTASSIUM CHLORIDE CRYS ER 20 MEQ PO TBCR
40.0000 meq | EXTENDED_RELEASE_TABLET | Freq: Three times a day (TID) | ORAL | Status: DC
  Administered 2012-09-14 – 2012-09-15 (×2): 40 meq via ORAL
  Filled 2012-09-14 (×3): qty 2

## 2012-09-14 MED ORDER — MORPHINE SULFATE 2 MG/ML IJ SOLN: 2.0000 mg | INTRAMUSCULAR | Status: DC | PRN

## 2012-09-14 MED ORDER — ASPIRIN 300 MG RE SUPP
300.0000 mg | RECTAL | Status: DC
  Filled 2012-09-14: qty 1

## 2012-09-14 MED ORDER — ACETAMINOPHEN 325 MG PO TABS
650.0000 mg | ORAL_TABLET | ORAL | Status: DC | PRN
  Administered 2012-09-15: 650 mg via ORAL
  Filled 2012-09-14: qty 2

## 2012-09-14 MED ORDER — SODIUM CHLORIDE 0.9 % IJ SOLN: 3.0000 mL | INTRAMUSCULAR | Status: DC | PRN

## 2012-09-14 MED ORDER — INSULIN ASPART 100 UNIT/ML ~~LOC~~ SOLN
0.0000 [IU] | Freq: Three times a day (TID) | SUBCUTANEOUS | Status: DC
  Administered 2012-09-14 – 2012-09-15 (×2): 5 [IU] via SUBCUTANEOUS
  Administered 2012-09-15: 15 [IU] via SUBCUTANEOUS

## 2012-09-14 MED ORDER — INSULIN GLARGINE 100 UNIT/ML ~~LOC~~ SOLN
10.0000 [IU] | Freq: Every morning | SUBCUTANEOUS | Status: DC
  Administered 2012-09-15: 10 [IU] via SUBCUTANEOUS

## 2012-09-14 MED ORDER — ASPIRIN 81 MG PO CHEW
324.0000 mg | CHEWABLE_TABLET | Freq: Once | ORAL | Status: AC
  Administered 2012-09-14: 324 mg via ORAL
  Filled 2012-09-14: qty 4

## 2012-09-14 MED ORDER — ENOXAPARIN SODIUM 40 MG/0.4ML ~~LOC~~ SOLN
40.0000 mg | SUBCUTANEOUS | Status: DC
  Administered 2012-09-14 – 2012-09-15 (×2): 40 mg via SUBCUTANEOUS
  Filled 2012-09-14 (×2): qty 0.4

## 2012-09-14 MED ORDER — ASPIRIN 81 MG PO CHEW
81.0000 mg | CHEWABLE_TABLET | Freq: Every day | ORAL | Status: DC
  Administered 2012-09-15: 81 mg via ORAL
  Filled 2012-09-14: qty 1

## 2012-09-14 MED ORDER — LEVOTHYROXINE SODIUM 50 MCG PO TABS
50.0000 ug | ORAL_TABLET | Freq: Every day | ORAL | Status: DC
  Administered 2012-09-14 – 2012-09-15 (×2): 50 ug via ORAL
  Filled 2012-09-14 (×4): qty 1

## 2012-09-14 MED ORDER — NITROGLYCERIN 0.4 MG SL SUBL
0.4000 mg | SUBLINGUAL_TABLET | SUBLINGUAL | Status: DC | PRN
Start: 2012-09-14 — End: 2012-09-15
  Administered 2012-09-14: 0.4 mg via SUBLINGUAL
  Filled 2012-09-14: qty 25

## 2012-09-14 MED ORDER — HYDROCHLOROTHIAZIDE 25 MG PO TABS
25.0000 mg | ORAL_TABLET | Freq: Every day | ORAL | Status: DC
  Administered 2012-09-14 – 2012-09-15 (×2): 25 mg via ORAL
  Filled 2012-09-14 (×2): qty 1

## 2012-09-14 MED ORDER — ISOSORBIDE MONONITRATE ER 60 MG PO TB24
60.0000 mg | ORAL_TABLET | Freq: Every day | ORAL | Status: DC
  Administered 2012-09-14 – 2012-09-15 (×2): 60 mg via ORAL
  Filled 2012-09-14 (×2): qty 1

## 2012-09-14 NOTE — ED Provider Notes (Signed)
History     CSN: [id number]  Arrival date & time 09/14/12  0711   First MD Initiated Contact with Patient 09/14/12 0725      Chief Complaint  Patient presents with  . Shoulder Pain    (Consider location/radiation/quality/duration/timing/severity/associated sxs/prior treatment) HPI Pt with L shoulder and back pain starting yesterday evening with no inciting events. No SOB, CP, fever chills. Pt had similar symptoms earlier in the year and had negative cath. No stents in place. Pain is worse with movement of the arm. She states she became lightheaded when standing this AM. No syncope, focal weakness or visual changes.  Past Medical History  Diagnosis Date  . History of thyroid cancer   . ETD (eustachian tube dysfunction)   . CTS (carpal tunnel syndrome)   . Anemia   . Gastritis   . Postmenopausal   . Chest wall pain   . Hypothyroid   . Screening for malignant neoplasm of cervix   . Cor pulmonale   . Acute URI   . Hypertension, benign   . Diabetes mellitus, type II   . Hypercholesteremia   . Angina   . GERD (gastroesophageal reflux disease)   . Myocardial infarction   . Coronary artery disease     Past Surgical History  Procedure Date  . Angioplasty     PDA 90-40 %  . Thyroidectomy   . Cholecystectomy 08-23-2002  . Endometrial biopsy 02-20-1998  . Bartholin gland cyst excision 09-13-2004    Keratosis- no CA  . Ptca 10/23/2000 and 03/15/09  . Cardiac catheterization 03/15/2009    stenosis of diagonal branch of LAD- PTCA performed  . Coronary angioplasty   . Tonsillectomy   . Breast biopsy     LEFT SPOT REMOVED  . Tubal ligation     Family History  Problem Relation Age of Onset  . Diabetes Mother     Deceased- hypoglycemic coma   . Hypertension Mother     History  Substance Use Topics  . Smoking status: Former Smoker    Types: Cigarettes    Quit date: 12/23/1980  . Smokeless tobacco: Former Neurosurgeon    Types: Chew    Quit date: 08/23/1994  . Alcohol Use: No     OB History    Grav Para Term Preterm Abortions TAB SAB Ect Mult Living                  Review of Systems  Constitutional: Negative for fever and chills.  HENT: Negative for neck pain and neck stiffness.   Respiratory: Negative for chest tightness and shortness of breath.   Cardiovascular: Negative for chest pain, palpitations and leg swelling.  Gastrointestinal: Negative for nausea, vomiting and abdominal pain.  Genitourinary: Negative for dysuria.  Musculoskeletal: Positive for myalgias and back pain.  Skin: Negative for rash and wound.  Neurological: Positive for dizziness and light-headedness. Negative for seizures, weakness, numbness and headaches.    Allergies  Codeine and Sulfamethoxazole w-trimethoprim  Home Medications   Current Outpatient Rx  Name Route Sig Dispense Refill  . AMLODIPINE BESYLATE 10 MG PO TABS Oral Take 1 tablet (10 mg total) by mouth daily. 90 tablet 11  . ASPIRIN 81 MG PO CHEW Oral Chew 81 mg by mouth daily.      [name] CARVEDILOL 25 MG PO TABS Oral Take 1 tablet (25 mg total) by mouth 2 (two) times daily. 60 tablet 11  . FLUTICASONE PROPIONATE 50 MCG/ACT NA SUSP Nasal Place 2 sprays into the  nose daily as needed. For allergies    . GLIPIZIDE ER 10 MG PO TB24 Oral Take 10 mg by mouth daily.      [name] HYDROCHLOROTHIAZIDE 25 MG PO TABS Oral Take 1 tablet (25 mg total) by mouth daily. 30 tablet 11  . INSULIN GLARGINE 100 UNIT/ML [place] SOLN Subcutaneous Inject 10 Units into the skin every morning.    . INSULIN SYRINGE 31G X 5/16" 1 ML MISC Does not apply 1 application by Does not apply route 1 day or 1 dose. 100 each 6  . ISOSORBIDE MONONITRATE ER 60 MG PO TB24 Oral Take 1 tablet (60 mg total) by mouth daily. 30 tablet 3  . LEVOTHYROXINE SODIUM 50 MCG PO TABS  TAKE ONE TABLET BY MOUTH EVERY DAY 30 tablet 6  . LISINOPRIL 20 MG PO TABS Oral Take 20 mg by mouth daily.    [name] LORATADINE-PSEUDOEPHEDRINE ER 10-240 MG PO TB24 Oral Take 1 tablet by mouth daily as needed.  For allergies    . METFORMIN HCL 1000 MG PO TABS Oral Take 1 tablet (1,000 mg total) by mouth 2 (two) times daily. 180 tablet 0  . NITROGLYCERIN 0.4 MG SL SUBL Sublingual Place 1 tablet (0.4 mg total) under the tongue every 5 (five) minutes as needed for chest pain. 90 tablet 3  . SIMVASTATIN 20 MG PO TABS Oral Take 1 tablet (20 mg total) by mouth daily at 6 PM. 30 tablet 3    BP 143/69  Pulse 75  Temp 99.3 F (37.4 C) (Oral)  Resp 20  Ht 5\' 2"  (1.575 m)  Wt 164 lb 10.9 oz (74.7 kg)  BMI 30.12 kg/m2  SpO2 100%  LMP 11/24/1992  Physical Exam  Nursing note and vitals reviewed. Constitutional: She is oriented to person, place, and time. She appears well-developed and well-nourished. No distress.  HENT:  Head: Normocephalic and atraumatic.  Mouth/Throat: Oropharynx is clear and moist.  Eyes: EOM are normal. Pupils are equal, round, and reactive to light.  Neck: Normal range of motion. Neck supple.  Cardiovascular: Normal rate and regular rhythm.   Pulmonary/Chest: Effort normal and breath sounds normal. No respiratory distress. She has no wheezes. She has no rales. She exhibits no tenderness.  Abdominal: Soft. Bowel sounds are normal. There is no tenderness. There is no rebound and no guarding.  Musculoskeletal: Normal range of motion. She exhibits tenderness (Pain reproduced with ROM of L shoulder. Mild TTP over L scapula and deltoid). She exhibits no edema.  Neurological: She is alert and oriented to person, place, and time.  Skin: Skin is warm and dry. No rash noted. No erythema.  Psychiatric: She has a normal mood and affect. Her behavior is normal.    ED Course  Procedures (including critical care time)  Labs Reviewed  COMPREHENSIVE METABOLIC PANEL - Abnormal; Notable for the following:    Glucose, Bld 206 (*)     GFR calc non Af Amer 86 (*)     All other components within normal limits  COMPREHENSIVE METABOLIC PANEL - Abnormal; Notable for the following:    Potassium 3.2  (*)     GFR calc non Af Amer 88 (*)     All other components within normal limits  HEMOGLOBIN A1C - Abnormal; Notable for the following:    Hemoglobin A1C 10.0 (*)     Mean Plasma Glucose 240 (*)     All other components within normal limits  GLUCOSE, CAPILLARY - Abnormal; Notable for the following:  Glucose-Capillary 230 (*)     All other components within normal limits  BASIC METABOLIC PANEL - Abnormal; Notable for the following:    Glucose, Bld 306 (*)     GFR calc non Af Amer 68 (*)     GFR calc Af Amer 79 (*)     All other components within normal limits  LIPID PANEL - Abnormal; Notable for the following:    Triglycerides 161 (*)     LDL Cholesterol 123 (*)     All other components within normal limits  GLUCOSE, CAPILLARY - Abnormal; Notable for the following:    Glucose-Capillary 242 (*)     All other components within normal limits  GLUCOSE, CAPILLARY - Abnormal; Notable for the following:    Glucose-Capillary 237 (*)     All other components within normal limits  GLUCOSE, CAPILLARY - Abnormal; Notable for the following:    Glucose-Capillary 367 (*)     All other components within normal limits  CBC WITH DIFFERENTIAL  PROTIME-INR  APTT  TROPONIN I  TROPONIN I  TROPONIN I  TSH   Dg Chest 2 View  09/14/2012  *RADIOLOGY REPORT*  Clinical Data: Left shoulder and left arm pain.  Cough.  History of coronary artery disease.  CHEST - 2 VIEW  Comparison: 11/25/2011 and 12/21/2007  Findings: The heart size and pulmonary vascularity are normal and the lungs are clear.  No osseous abnormality.  Calcification in the aorta.  IMPRESSION: No acute abnormalities.   Original Report Authenticated By: Gwynn Burly, M.D.      1. Chest pain       MDM   Fam med resident to see and admit       Loren Racer, MD 09/15/12 [phone]

## 2012-09-14 NOTE — ED Notes (Signed)
Results of troponin are normal,  0.00 ng/mL

## 2012-09-14 NOTE — H&P (Signed)
I have seen and examined this patient. I have discussed with Dr Cristal Ford.  I agree with their findings and plans as documented in their admission note.  Acute Issues 1. Posterior Left Shoulder pain - Acute onset - improved with NTG SL per patient - History of PTCA angioplasty in 2001 and 2010.  - History of unremarkable cardiac cath in 11/2011. Pt had complaint of initially right posterior shoulder pain then left posterior shoulder pain during 2012 hospitalization. - Tender to palpation along left inferior scapula and interscapular paraspinal muscles.  - Non-specific ST changes - Troponin I <0.3 once - CHEST XRAY (2V): No acute abnormality  Plan; Rule out MI, if pain recurs, then will consult patient's cardiologist, Dr Eden Emms.  Continue Home IMDUR and daily aspirin.

## 2012-09-14 NOTE — ED Notes (Signed)
Pt reports she has had intermittent non-radiating pain in her left shoulder over the past couple of days. Denies any n/v or SOB when the pain is present. Denies pain with deep inspiration. Breath sounds clear bilaterally. Skin warm and dry. No distress or pain at present.

## 2012-09-14 NOTE — H&P (Signed)
Family Medicine Teaching [facility] Admission History and Physical  Patient name: [name] Medical record number: [id number] Date of birth: 09/28/1941 Age: 71 y.o. Gender: female  Primary Care Provider: Delbert Harness, MD  Chief Complaint: left shoulder/arm pain  History of Present Illness: [name] is a 71 y.o. year old female with T2DM, history CAD, HTN presenting with left arm/shoulder pain similar to previous episode of MI. She began feeling left shoulder pain radiating to upper arm yesterday evening while laying on the couch at 5pm. She got up and walking around, felt a little lightheaded. Pain continued all evening and woke up with worsening, therefore presents to ED. She says this is similar to previous pain associated with ACS. Rated 8/10, worse with movement and using left arm. Nothing improves it. Has mild associated nausea, otherwise denies diaphoresis, emesis, dyspnea, cough, fever, chills or recent illness. Denies recent exertional or emotional events. She was given aspirin with improval of pain from 8 to 7/10. The EDP notes nonspecific T wave changes and negative POC troponin (not carried over in EPIC) on arrival. He called [facility] to admit for rule out.    She previously saw cardiologist Dr. Eden Emms in 12/2011 after having a negative heart catheterization in 11/2011. Has remote history of a PDA stent and an angioplasty of diagonal branch in 2010. She is compliant with medications but has not followed up with her diabetic management at [facility] in > 1 year.   Patient Active Problem List  Diagnosis  . HYPOTHYROIDISM, UNSPECIFIED  . DIABETES MELLITUS II, UNCOMPLICATED  . HYPERCHOLESTEROLEMIA  . ANEMIA, OTHER, UNSPECIFIED  . CARPAL TUNNEL SYNDROME  . HYPERTENSION, BENIGN SYSTEMIC  . CORONARY, ARTERIOSCLEROSIS  . COR PULMONALE  . Hospital discharge follow-up  . Allergic rhinitis  . Screening for colon cancer  . Viral pharyngitis   Past Medical History: Past  Medical History  Diagnosis Date  . History of thyroid cancer   . ETD (eustachian tube dysfunction)   . CTS (carpal tunnel syndrome)   . Anemia   . Gastritis   . Postmenopausal   . Chest wall pain   . Hypothyroid   . Screening for malignant neoplasm of cervix   . Cor pulmonale   . Acute URI   . Hypertension, benign   . Diabetes mellitus, type II   . Hypercholesteremia   . Angina   . GERD (gastroesophageal reflux disease)   . Myocardial infarction     Past Surgical History: Past Surgical History  Procedure Date  . Angioplasty     PDA 90-40 %  . Thyroidectomy   . Cholecystectomy 08-23-2002  . Endometrial biopsy 02-20-1998  . Bartholin gland cyst excision 09-13-2004    Keratosis- no CA  . Ptca 10/23/2000 and 03/15/09  . Cardiac catheterization 03/15/2009    stenosis of diagonal branch of LAD- PTCA performed  . Coronary angioplasty   . Tonsillectomy   . Breast biopsy     LEFT SPOT REMOVED  . Tubal ligation     Social History: History   Social History  . Marital Status: Widowed    Spouse Name: N/A    Number of Children: 6  . Years of Education: N/A   Occupational History  . Housekeeper [employer]   Social History Main Topics  . Smoking status: Former Smoker    Types: Cigarettes    Quit date: 12/23/1980  . Smokeless tobacco: Former Neurosurgeon    Types: Chew    Quit date: 08/23/1994  . Alcohol Use:  No  . Drug Use: No  . Sexually Active: No   Other Topics Concern  . None   Social History Narrative  . None    Family History: Family History  Problem Relation Age of Onset  . Diabetes Mother     Deceased- hypoglycemic coma   . Hypertension Mother     Allergies: Allergies  Allergen Reactions  . Codeine     REACTION: GI Intolerance  . Sulfamethoxazole W-Trimethoprim     REACTION: Gi intolerence    Current Facility-Administered Medications  Medication Dose Route Frequency Provider Last Rate Last Dose  . aspirin chewable tablet 324 mg  324 mg Oral Once Loren Racer, MD   324 mg at 09/14/12 0806  . nitroGLYCERIN (NITROSTAT) SL tablet 0.4 mg  0.4 mg Sublingual Once Dayarmys Piloto de Criselda Peaches, MD      . nitroGLYCERIN (NITROSTAT) SL tablet 0.4 mg  0.4 mg Sublingual Q5 min PRN Loren Racer, MD   0.4 mg at 09/14/12 1131   Current Outpatient Prescriptions  Medication Sig Dispense Refill  . amLODipine (NORVASC) 10 MG tablet Take 1 tablet (10 mg total) by mouth daily.  90 tablet  11  . aspirin 81 MG chewable tablet Chew 81 mg by mouth daily.        . carvedilol (COREG) 25 MG tablet Take 1 tablet (25 mg total) by mouth 2 (two) times daily.  60 tablet  11  . fluticasone (FLONASE) 50 MCG/ACT nasal spray Place 2 sprays into the nose daily as needed. For allergies      . glipiZIDE (GLUCOTROL) 10 MG 24 hr tablet Take 10 mg by mouth daily.        . hydrochlorothiazide 25 MG tablet Take 1 tablet (25 mg total) by mouth daily.  30 tablet  11  . insulin glargine (LANTUS) 100 UNIT/ML injection Inject 10 Units into the skin every morning.      . Insulin Syringe-Needle U-100 (INSULIN SYRINGE 1CC/31GX5/16") 31G X 5/16" 1 ML MISC 1 application by Does not apply route 1 day or 1 dose.  100 each  6  . isosorbide mononitrate (IMDUR) 60 MG 24 hr tablet Take 1 tablet (60 mg total) by mouth daily.  30 tablet  3  . levothyroxine (SYNTHROID, LEVOTHROID) 50 MCG tablet TAKE ONE TABLET BY MOUTH EVERY DAY  30 tablet  6  . lisinopril (PRINIVIL,ZESTRIL) 20 MG tablet Take 20 mg by mouth daily.      [name] loratadine-pseudoephedrine (CLARITIN-D 24-HOUR) 10-240 MG per 24 hr tablet Take 1 tablet by mouth daily as needed. For allergies      . metFORMIN (GLUCOPHAGE) 1000 MG tablet Take 1 tablet (1,000 mg total) by mouth 2 (two) times daily.  180 tablet  0  . nitroGLYCERIN (NITROSTAT) 0.4 MG SL tablet Place 1 tablet (0.4 mg total) under the tongue every 5 (five) minutes as needed for chest pain.  90 tablet  3   Review Of Systems: Per HPI with the following additions: see HPI Otherwise 12 point  review of systems was performed and was unremarkable.  Physical Exam: Pulse: 89  Blood Pressure: 156/59 RR: 15   O2: 100 on RA Temp: 98.2  General: alert, cooperative, appears stated age and no distress HEENT: PERRLA, extra ocular movement intact, sclera clear, anicteric and neck supple with midline trachea Heart: S1, S2 normal, no murmur, rub or gallop, regular rate and rhythm Lungs: clear to auscultation, no wheezes or rales and unlabored breathing Abdomen: abdomen is soft  without significant tenderness, masses, organomegaly or guarding Extremities: extremities normal, atraumatic, no cyanosis or edema. Left scapular/rhomboid area TTP. Worsened when turns onto left side. No chest wall ttp. Skin:no rashes Neurology: normal without focal findings, mental status, speech normal, alert and oriented x3, PERLA and muscle tone and strength normal and symmetric  Labs and Imaging: Lab Results  Component Value Date/Time   NA 140 09/14/2012  7:35 AM   K 3.5 09/14/2012  7:35 AM   CL 99 09/14/2012  7:35 AM   CO2 30 09/14/2012  7:35 AM   BUN 14 09/14/2012  7:35 AM   CREATININE 0.72 09/14/2012  7:35 AM   CREATININE 0.92 02/06/2011 11:34 AM   GLUCOSE 206* 09/14/2012  7:35 AM   Lab Results  Component Value Date   WBC 6.7 09/14/2012   HGB 12.1 09/14/2012   HCT 37.1 09/14/2012   MCV 80.8 09/14/2012   PLT 185 09/14/2012   Results for orders placed during the hospital encounter of 09/14/12 (from the past 24 hour(s))  CBC WITH DIFFERENTIAL     Status: Normal   Collection Time   09/14/12  7:35 AM      Component Value Range   WBC 6.7  4.0 - 10.5 K/uL   RBC 4.59  3.87 - 5.11 MIL/uL   Hemoglobin 12.1  12.0 - 15.0 g/dL   HCT 16.1  09.6 - 04.5 %   MCV 80.8  78.0 - 100.0 fL   MCH 26.4  26.0 - 34.0 pg   MCHC 32.6  30.0 - 36.0 g/dL   RDW 40.9  81.1 - 91.4 %   Platelets 185  150 - 400 K/uL   Neutrophils Relative 49  43 - 77 %   Neutro Abs 3.3  1.7 - 7.7 K/uL   Lymphocytes Relative 39  12 - 46 %   Lymphs Abs 2.6   0.7 - 4.0 K/uL   Monocytes Relative 9  3 - 12 %   Monocytes Absolute 0.6  0.1 - 1.0 K/uL   Eosinophils Relative 2  0 - 5 %   Eosinophils Absolute 0.2  0.0 - 0.7 K/uL   Basophils Relative 1  0 - 1 %   Basophils Absolute 0.0  0.0 - 0.1 K/uL  COMPREHENSIVE METABOLIC PANEL     Status: Abnormal   Collection Time   09/14/12  7:35 AM      Component Value Range   Sodium 140  135 - 145 mEq/L   Potassium 3.5  3.5 - 5.1 mEq/L   Chloride 99  96 - 112 mEq/L   CO2 30  19 - 32 mEq/L   Glucose, Bld 206 (*) 70 - 99 mg/dL   BUN 14  6 - 23 mg/dL   Creatinine, Ser 7.82  0.50 - 1.10 mg/dL   Calcium 95.6  8.4 - 21.3 mg/dL   Total Protein 7.9  6.0 - 8.3 g/dL   Albumin 4.0  3.5 - 5.2 g/dL   AST 23  0 - 37 U/L   ALT 29  0 - 35 U/L   Alkaline Phosphatase 96  39 - 117 U/L   Total Bilirubin 0.5  0.3 - 1.2 mg/dL   GFR calc non Af Amer 86 (*) >90 mL/min   GFR calc Af Amer >90  >90 mL/min  PROTIME-INR     Status: Normal   Collection Time   09/14/12  7:35 AM      Component Value Range   Prothrombin Time 12.7  11.6 -  15.2 seconds   INR 0.96  0.00 - 1.49  APTT     Status: Normal   Collection Time   09/14/12  7:35 AM      Component Value Range   aPTT 29  24 - 37 seconds   Chest XRAY: no acute findings.  EKG: NSR 81 bpm, nonspecific T wave changes, diffuse TWI unchanged from previous, perhaps mild flattening in V5-6 compared to previous.    Assessment and Plan: AALIYHA GUERRIERO is a 71 y.o. year old female with DM2, CAD with normal catheterization last year, HTN, hypothyroidism presents with left shoulder/arm pain concerning for atypical chest pain vs musculoskeletal strain.   1. Left shoulder pain. Must consider ACS, though unlikely with negative troponin 12 hours after onset. Nonspecific EKG changes. Pain not greatly changed with aspirin or nitro. Most likely this is MSK pain exacerbated on exam and with movements. Will cycle troponin q6 x 3. Morphine IV prn. Continue coreg and imdur. Repeat EKG in am.  Continue daily aspirin. If negative, treat with NSAIDs.  2. DM type 2. Patient has not followed up for management in past year. Compliant with meds. Will repeat A1c, continue lantus 10 u qhs. Hold metformin and glipizide. Start SSI moderate. Lab Results  Component Value Date   HGBA1C 8.6* 11/25/2011   3. HLD. Statin is not on medication list? Check FLP in am. Plan to start simvastatin and titrate as outpatient.  4. Hypothyroidism. Continue current dose thyroxine. Check TSH is due.  5. HTN. At goal on lisinopril, HCTZ, norvasc and coreg.  6. FEN/GI: heart healthy diet. Saline lock IV.  7. Prophylaxis: lovenox SQ  8. Disposition: to home after cardiac rule out. Full code status.  Lloyd Huger, MD [facility] Resident - PGY-3 09/14/2012 12:14 PM

## 2012-09-14 NOTE — ED Notes (Addendum)
C/o L shoulder pain, similar to MI, onset yesterday, near syncope yesterday, denies other sx, no meds PTA. 8/10. Pain worse with movement. Pt of Dr. Eden Emms.

## 2012-09-15 LAB — TSH: TSH: 0.944 u[IU]/mL (ref 0.350–4.500)

## 2012-09-15 LAB — LIPID PANEL
Cholesterol: 195 mg/dL (ref 0–200)
HDL: 40 mg/dL
LDL Cholesterol: 123 mg/dL — ABNORMAL HIGH (ref 0–99)
Total CHOL/HDL Ratio: 4.9 ratio
Triglycerides: 161 mg/dL — ABNORMAL HIGH
VLDL: 32 mg/dL (ref 0–40)

## 2012-09-15 LAB — GLUCOSE, CAPILLARY: Glucose-Capillary: 367 mg/dL — ABNORMAL HIGH (ref 70–99)

## 2012-09-15 LAB — BASIC METABOLIC PANEL
Calcium: 10 mg/dL (ref 8.4–10.5)
GFR calc non Af Amer: 68 mL/min — ABNORMAL LOW (ref >90)
Sodium: 138 mEq/L (ref 135–145)

## 2012-09-15 LAB — TROPONIN I: Troponin I: <0.3 ng/mL

## 2012-09-15 MED ORDER — SIMVASTATIN 20 MG PO TABS: 20.0000 mg | ORAL_TABLET | Freq: Every day | ORAL | Status: DC

## 2012-09-15 NOTE — Progress Notes (Signed)
Inpatient Diabetes Program Recommendations  AACE/ADA: New Consensus Statement on Inpatient Glycemic Control (2013)  Target Ranges:  Prepandial:   less than 140 mg/dL      Peak postprandial:   less than 180 mg/dL (1-2 hours)      Critically ill patients:  140 - 180 mg/dL   Reason for Visit: Results for ARELIZ, ROTHMAN (MRN [id number]) as of 09/15/2012 12:37  Ref. Range 09/14/2012 16:01 09/14/2012 21:52 09/15/2012 06:43 09/15/2012 11:21  Glucose-Capillary Latest Range: 70-99 mg/dL 130 (H) 865 (H) 784 (H) 367 (H)   Note A1c=10.0% indicating poor glycemic control prior to admit. Patient will need titration of Lantus based on A1C and CBG's.  Consider increasing Lantus to 20 units daily.  Also please add Novolog meal coverage 4 units tid with meals.  Will follow.

## 2012-09-15 NOTE — Progress Notes (Signed)
Patient ID: [name], female   DOB: [birth year], 71 y.o.   MRN: [id number] Family Medicine Teaching Service Daily Progress Note Service Page: [phone]  Patient Assessment: 71 yo female presenting with atypical chest pain in her left shoulder  Subjective: Patient states doing well.  Pain has resolved. No complaints currently  Objective: Temp:  [98.2 F (36.8 C)-99.3 F (37.4 C)] 99.3 F (37.4 C) (09/24 0815) Pulse Rate:  [65-77] 75  (09/24 0815) Resp:  [14-23] 20  (09/24 0815) BP: (112-156)/(53-77) 143/69 mmHg (09/24 0815) SpO2:  [96 %-100 %] 100 % (09/24 0815) Weight:  [164 lb 10.9 oz (74.7 kg)] 164 lb 10.9 oz (74.7 kg) (09/23 1258) Exam: General: NAD, sitting in chair at bedside Cardiovascular: rrr, no murmurs rubs or gallops Respiratory: CTAB, no wheezes Extremities: no edema  I have reviewed the patient's medications, labs, imaging, and diagnostic testing.  Notable results are summarized below.  CBC BMET   Lab 09/14/12 0735  WBC 6.7  HGB 12.1  HCT 37.1  PLT 185    Lab 09/15/12 0130 09/14/12 1329 09/14/12 0735  NA 138 141 140  K 3.5 3.2* 3.5  CL 98 100 99  CO2 31 28 30   BUN 19 13 14   CREATININE 0.85 0.66 0.72  GLUCOSE 306* 93 206*  CALCIUM 10.0 10.3 10.3     Results for orders placed during the hospital encounter of 09/14/12 (from the past 24 hour(s))  COMPREHENSIVE METABOLIC PANEL     Status: Abnormal   Collection Time   09/14/12  1:29 PM      Component Value Range   Sodium 141  135 - 145 mEq/L   Potassium 3.2 (*) 3.5 - 5.1 mEq/L   Chloride 100  96 - 112 mEq/L   CO2 28  19 - 32 mEq/L   Glucose, Bld 93  70 - 99 mg/dL   BUN 13  6 - 23 mg/dL   Creatinine, Ser 1.47  0.50 - 1.10 mg/dL   Calcium 82.9  8.4 - 56.2 mg/dL   Total Protein 7.6  6.0 - 8.3 g/dL   Albumin 3.6  3.5 - 5.2 g/dL   AST 28  0 - 37 U/L   ALT 28  0 - 35 U/L   Alkaline Phosphatase 84  39 - 117 U/L   Total Bilirubin 0.5  0.3 - 1.2 mg/dL   GFR calc non Af Amer 88 (*) >90 mL/min   GFR  calc Af Amer >90  >90 mL/min  TROPONIN I     Status: Normal   Collection Time   09/14/12  1:29 PM      Component Value Range   Troponin I <0.30  <0.30 ng/mL  HEMOGLOBIN A1C     Status: Abnormal   Collection Time   09/14/12  1:29 PM      Component Value Range   Hemoglobin A1C 10.0 (*) <5.7 %   Mean Plasma Glucose 240 (*) <117 mg/dL  GLUCOSE, CAPILLARY     Status: Abnormal   Collection Time   09/14/12  4:01 PM      Component Value Range   Glucose-Capillary 230 (*) 70 - 99 mg/dL  TROPONIN I     Status: Normal   Collection Time   09/14/12  7:00 PM      Component Value Range   Troponin I <0.30  <0.30 ng/mL  GLUCOSE, CAPILLARY     Status: Abnormal   Collection Time   09/14/12  9:52 PM  Component Value Range   Glucose-Capillary 242 (*) 70 - 99 mg/dL  TROPONIN I     Status: Normal   Collection Time   09/15/12  1:30 AM      Component Value Range   Troponin I <0.30  <0.30 ng/mL  BASIC METABOLIC PANEL     Status: Abnormal   Collection Time   09/15/12  1:30 AM      Component Value Range   Sodium 138  135 - 145 mEq/L   Potassium 3.5  3.5 - 5.1 mEq/L   Chloride 98  96 - 112 mEq/L   CO2 31  19 - 32 mEq/L   Glucose, Bld 306 (*) 70 - 99 mg/dL   BUN 19  6 - 23 mg/dL   Creatinine, Ser 0.45  0.50 - 1.10 mg/dL   Calcium 40.9  8.4 - 81.1 mg/dL   GFR calc non Af Amer 68 (*) >90 mL/min   GFR calc Af Amer 79 (*) >90 mL/min  LIPID PANEL     Status: Abnormal   Collection Time   09/15/12  1:30 AM      Component Value Range   Cholesterol 195  0 - 200 mg/dL   Triglycerides 914 (*) <150 mg/dL   HDL 40  >78 mg/dL   Total CHOL/HDL Ratio 4.9     VLDL 32  0 - 40 mg/dL   LDL Cholesterol 295 (*) 0 - 99 mg/dL  GLUCOSE, CAPILLARY     Status: Abnormal   Collection Time   09/15/12  6:43 AM      Component Value Range   Glucose-Capillary 237 (*) 70 - 99 mg/dL   Imaging/Diagnostic Tests: CXR: No acute abnormalities  EKG: inferior lead T wave inversions, unchanged on repeat EKG  Plan: ADITHI POPKO is a 71 y.o. year old female with DM2, CAD with normal catheterization last year, HTN, hypothyroidism presents with left shoulder/arm pain concerning for atypical chest pain vs musculoskeletal strain.   1. Left shoulder pain. ACS ruled out as troponins x3 negative.  Pain likely MSK in nature. -will continue aspirin -continue coreg and imdur as home medications -repeat EKG reveals unchenag non-specific T wave changes -consider sending out on NSAID for treatment of pain  2. DM type 2. Patient has not followed up for management in past year. Compliant with meds.  -repeat A1c is 10-will need to consider changes to current home regimen of metformin 1000 mg BID, glipizide 10 mg qd, and lantus 10 U daily. -would consider initially increasing lantus to 20 U daily at time of discahrge and have patient follow-up for further management            3. HLD. Patient not on statin at home.  -TG 161, HDL 40, LDL 123 -started on simvastatin 20 mg daily-will continue this at time of discharge   4. Hypothyroidism. Continue current dose thyroxine. TSH pending.   5. HTN. At goal on lisinopril, HCTZ, norvasc and coreg.   6. FEN/GI: heart healthy diet. Saline lock IV.  7. Prophylaxis: lovenox SQ  8. Disposition: home today with close follow-up. Full code status.   Marikay Alar, MD 09/15/2012, 9:09 AM

## 2012-09-15 NOTE — Discharge Summary (Signed)
Physician Discharge Summary  Patient ID: [name] MRN: [id number] DOB: 1941/03/17 Age: 71 y.o.  Admit date: 09/14/2012 Discharge date: 09/15/2012 Admitting Physician: Barbaraann Barthel, MD  PCP: Delbert Harness, MD  Consultants:none     Discharge Diagnosis: Atypical Chest pain Principal Problem:  *Chest pain Active Problems:  HYPOTHYROIDISM, UNSPECIFIED  DIABETES MELLITUS II, UNCOMPLICATED  HYPERCHOLESTEROLEMIA  HYPERTENSION, BENIGN SYSTEMIC  CORONARY, ARTERIOSCLEROSIS    Hospital Course [name] is a 71 y.o. year old female with DM2, CAD with normal catheterization last year, HTN, hypothyroidism presents with left shoulder/arm pain concerning for atypical chest pain vs musculoskeletal strain.   1. Left shoulder pain. Presented with left arm/shoulder pain similar to previous episode of MI. She began feeling left shoulder pain radiating to upper arm the day prior to admission while laying on the couch at 5pm. She got up and walking around, felt a little lightheaded. Pain continued all evening and woke up with worsening, therefore presented to ED. She said this is similar to previous pain associated with ACS. Rated 8/10, worse with movement and using left arm. Nothing improves it. Has mild associated nausea, otherwise denies diaphoresis, emesis, dyspnea, cough, fever, chills or recent illness. Denies recent exertional or emotional events. She was given aspirin with improval of pain from 8 to 7/10. The EDP notes nonspecific T wave changes and negative POC troponin (not carried over in EPIC) on arrival.   Resolved shortly after admission. ACS ruled out as troponins x3 negative. Pain likely MSK in nature. Continued aspirin. Continued coreg and imdur as home medications. Repeat EKG reveals unchanged non-specific T wave changes.    2. DM type 2. Patient has not followed up for management in past year. Compliant with meds. Repeat A1c is 10-will need to consider changes to current  home regimen of metformin 1000 mg BID, glipizide 10 mg qd, and lantus 10 U daily. Patient was discharged on home regimen.            3. HLD. Patient not on statin at home. TG 161, HDL 40, LDL 123. Started on simvastatin 20 mg daily.  4. Hypothyroidism. Continued current dose thyroxine. TSH 0.944.   5. HTN. BP at discharge 143/69 on lisinopril, HCTZ, norvasc and coreg.   Problem List 1. Left shoulder pain 2. DM II 3. HLD 4. Hypothyroidism 5. HTN         Discharge PE   Filed Vitals:   09/15/12 0951  BP: 143/69  Pulse:   Temp:   Resp:    General: NAD, sitting in chair at bedside  Cardiovascular: rrr, no murmurs rubs or gallops  Respiratory: CTAB, no wheezes  Extremities: no edema   Procedures/Imaging:  Dg Chest 2 View  09/14/2012  IMPRESSION: No acute abnormalities.    Labs  CBC  Lab 09/14/12 0735  WBC 6.7  HGB 12.1  HCT 37.1  PLT 185   BMET  Lab 09/15/12 0130 09/14/12 1329 09/14/12 0735  NA 138 141 140  K 3.5 3.2* 3.5  CL 98 100 99  CO2 31 28 30   BUN 19 13 14   CREATININE 0.85 0.66 0.72  CALCIUM 10.0 10.3 10.3  PROT -- 7.6 7.9  BILITOT -- 0.5 0.5  ALKPHOS -- 84 96  ALT -- 28 29  AST -- 28 23  GLUCOSE 306* 93 206*   Results for orders placed during the hospital encounter of 09/14/12 (from the past 72 hour(s))  CBC WITH DIFFERENTIAL     Status: Normal  Collection Time   09/14/12  7:35 AM      Component Value Range Comment   WBC 6.7  4.0 - 10.5 K/uL    RBC 4.59  3.87 - 5.11 MIL/uL    Hemoglobin 12.1  12.0 - 15.0 g/dL    HCT 16.1  09.6 - 04.5 %    MCV 80.8  78.0 - 100.0 fL    MCH 26.4  26.0 - 34.0 pg    MCHC 32.6  30.0 - 36.0 g/dL    RDW 40.9  81.1 - 91.4 %    Platelets 185  150 - 400 K/uL    Neutrophils Relative 49  43 - 77 %    Neutro Abs 3.3  1.7 - 7.7 K/uL    Lymphocytes Relative 39  12 - 46 %    Lymphs Abs 2.6  0.7 - 4.0 K/uL    Monocytes Relative 9  3 - 12 %    Monocytes Absolute 0.6  0.1 - 1.0 K/uL    Eosinophils Relative 2  0 - 5 %     Eosinophils Absolute 0.2  0.0 - 0.7 K/uL    Basophils Relative 1  0 - 1 %    Basophils Absolute 0.0  0.0 - 0.1 K/uL   COMPREHENSIVE METABOLIC PANEL     Status: Abnormal   Collection Time   09/14/12  7:35 AM      Component Value Range Comment   Sodium 140  135 - 145 mEq/L    Potassium 3.5  3.5 - 5.1 mEq/L    Chloride 99  96 - 112 mEq/L    CO2 30  19 - 32 mEq/L    Glucose, Bld 206 (*) 70 - 99 mg/dL    BUN 14  6 - 23 mg/dL    Creatinine, Ser 7.82  0.50 - 1.10 mg/dL    Calcium 95.6  8.4 - 10.5 mg/dL    Total Protein 7.9  6.0 - 8.3 g/dL    Albumin 4.0  3.5 - 5.2 g/dL    AST 23  0 - 37 U/L    ALT 29  0 - 35 U/L    Alkaline Phosphatase 96  39 - 117 U/L    Total Bilirubin 0.5  0.3 - 1.2 mg/dL    GFR calc non Af Amer 86 (*) >90 mL/min    GFR calc Af Amer >90  >90 mL/min   PROTIME-INR     Status: Normal   Collection Time   09/14/12  7:35 AM      Component Value Range Comment   Prothrombin Time 12.7  11.6 - 15.2 seconds    INR 0.96  0.00 - 1.49   APTT     Status: Normal   Collection Time   09/14/12  7:35 AM      Component Value Range Comment   aPTT 29  24 - 37 seconds   COMPREHENSIVE METABOLIC PANEL     Status: Abnormal   Collection Time   09/14/12  1:29 PM      Component Value Range Comment   Sodium 141  135 - 145 mEq/L    Potassium 3.2 (*) 3.5 - 5.1 mEq/L    Chloride 100  96 - 112 mEq/L    CO2 28  19 - 32 mEq/L    Glucose, Bld 93  70 - 99 mg/dL    BUN 13  6 - 23 mg/dL    Creatinine, Ser 2.13  0.50 - 1.10 mg/dL  Calcium 10.3  8.4 - 10.5 mg/dL    Total Protein 7.6  6.0 - 8.3 g/dL    Albumin 3.6  3.5 - 5.2 g/dL    AST 28  0 - 37 U/L    ALT 28  0 - 35 U/L    Alkaline Phosphatase 84  39 - 117 U/L    Total Bilirubin 0.5  0.3 - 1.2 mg/dL    GFR calc non Af Amer 88 (*) >90 mL/min    GFR calc Af Amer >90  >90 mL/min   TROPONIN I     Status: Normal   Collection Time   09/14/12  1:29 PM      Component Value Range Comment   Troponin I <0.30  <0.30 ng/mL   HEMOGLOBIN A1C     Status:  Abnormal   Collection Time   09/14/12  1:29 PM      Component Value Range Comment   Hemoglobin A1C 10.0 (*) <5.7 %    Mean Plasma Glucose 240 (*) <117 mg/dL   GLUCOSE, CAPILLARY     Status: Abnormal   Collection Time   09/14/12  4:01 PM      Component Value Range Comment   Glucose-Capillary 230 (*) 70 - 99 mg/dL   TROPONIN I     Status: Normal   Collection Time   09/14/12  7:00 PM      Component Value Range Comment   Troponin I <0.30  <0.30 ng/mL   GLUCOSE, CAPILLARY     Status: Abnormal   Collection Time   09/14/12  9:52 PM      Component Value Range Comment   Glucose-Capillary 242 (*) 70 - 99 mg/dL   TROPONIN I     Status: Normal   Collection Time   09/15/12  1:30 AM      Component Value Range Comment   Troponin I <0.30  <0.30 ng/mL   TSH     Status: Normal   Collection Time   09/15/12  1:30 AM      Component Value Range Comment   TSH 0.944  0.350 - 4.500 uIU/mL   BASIC METABOLIC PANEL     Status: Abnormal   Collection Time   09/15/12  1:30 AM      Component Value Range Comment   Sodium 138  135 - 145 mEq/L    Potassium 3.5  3.5 - 5.1 mEq/L    Chloride 98  96 - 112 mEq/L    CO2 31  19 - 32 mEq/L    Glucose, Bld 306 (*) 70 - 99 mg/dL    BUN 19  6 - 23 mg/dL    Creatinine, Ser 9.56  0.50 - 1.10 mg/dL    Calcium 21.3  8.4 - 10.5 mg/dL    GFR calc non Af Amer 68 (*) >90 mL/min    GFR calc Af Amer 79 (*) >90 mL/min   LIPID PANEL     Status: Abnormal   Collection Time   09/15/12  1:30 AM      Component Value Range Comment   Cholesterol 195  0 - 200 mg/dL    Triglycerides 086 (*) <150 mg/dL    HDL 40  >57 mg/dL    Total CHOL/HDL Ratio 4.9      VLDL 32  0 - 40 mg/dL    LDL Cholesterol 846 (*) 0 - 99 mg/dL   GLUCOSE, CAPILLARY     Status: Abnormal   Collection Time   09/15/12  6:43 AM      Component Value Range Comment   Glucose-Capillary 237 (*) 70 - 99 mg/dL   GLUCOSE, CAPILLARY     Status: Abnormal   Collection Time   09/15/12 11:21 AM      Component Value Range  Comment   Glucose-Capillary 367 (*) 70 - 99 mg/dL    Comment 1 Documented in Chart      Comment 2 Notify RN          Patient condition at time of discharge/disposition: stable  Disposition-home   Follow up issues: 1. DM: blood glucose in 200's-367 and A1c of 10.  Consider increasing home lantus dose to cover this increase in A1c. 2. Consider outpatient visit to cardiologist if chest pain or shoulder pain continues to occur.  Discharge follow up:   Follow-up Information    Follow up with Delbert Harness, MD. On 09/21/2012. (at 1:30 pm)    Contact information:   [address] [place] [address] [phone]          Discharge Instructions: Please refer to Patient Instructions section of EMR for full details.  Patient was counseled important signs and symptoms that should prompt return to medical care, changes in medications, dietary instructions, activity restrictions, and follow up appointments.  Significant instructions noted below:  Discharge Orders    Future Appointments: Provider: Department: Dept Phone: Center:   09/21/2012 1:30 PM Macy Mis, MD [facility] [phone] [facility]       Discharge Medications   Medication List     As of 09/16/2012  4:21 PM    START taking these medications         simvastatin 20 MG tablet   Commonly known as: ZOCOR   Take 1 tablet (20 mg total) by mouth daily at 6 PM.      CONTINUE taking these medications         amLODipine 10 MG tablet   Commonly known as: NORVASC   Take 1 tablet (10 mg total) by mouth daily.      aspirin 81 MG chewable tablet      fluticasone 50 MCG/ACT nasal spray   Commonly known as: FLONASE      glipiZIDE 10 MG 24 hr tablet   Commonly known as: GLUCOTROL XL      hydrochlorothiazide 25 MG tablet   Commonly known as: HYDRODIURIL   Take 1 tablet (25 mg total) by mouth daily.      insulin glargine 100 UNIT/ML injection   Commonly known as: LANTUS      INSULIN SYRINGE  1CC/31GX5/16" 31G X 5/16" 1 ML Misc   1 application by Does not apply route 1 day or 1 dose.      isosorbide mononitrate 60 MG 24 hr tablet   Commonly known as: IMDUR   Take 1 tablet (60 mg total) by mouth daily.      levothyroxine 50 MCG tablet   Commonly known as: SYNTHROID, LEVOTHROID   TAKE ONE TABLET BY MOUTH EVERY DAY      lisinopril 20 MG tablet   Commonly known as: PRINIVIL,ZESTRIL      loratadine-pseudoephedrine 10-240 MG per 24 hr tablet   Commonly known as: CLARITIN-D 24-hour      metFORMIN 1000 MG tablet   Commonly known as: GLUCOPHAGE   Take 1 tablet (1,000 mg total) by mouth 2 (two) times daily.      nitroGLYCERIN 0.4 MG SL tablet   Commonly known as: NITROSTAT  Place 1 tablet (0.4 mg total) under the tongue every 5 (five) minutes as needed for chest pain.          Where to get your medications    These are the prescriptions that you need to pick up. We sent them to a specific pharmacy, so you will need to go there to get them.   [id number] - [place] ([place]), [place] - [address]    [address]  ([address]) [place] [address]    Phone: [phone]        simvastatin 20 MG tablet           Marikay Alar, MD of [facility] 09/16/2012 3:48 PM

## 2012-09-15 NOTE — Progress Notes (Signed)
I discussed with Dr Sonnenberg.  I agree with their plans documented in their progress note for today.  

## 2012-09-15 NOTE — Care Management Note (Unsigned)
    Page 1 of 1   09/15/2012     8:48:58 AM   CARE MANAGEMENT NOTE 09/15/2012  Patient:  [name], [name]   Account Number:  [id number]  Date Initiated:  09/15/2012  Documentation initiated by:  SIMMONS,Halie Gass  Subjective/Objective Assessment:   ADMITTED WITH CP; LIVES AT HOME WITH GRANDDAUGHTER - TEQUILA; WAS IPTA- NO DME.     Action/Plan:   DISCHARGE PLANNING DISCUSSED AT BEDSIDE.   Anticipated DC Date:  09/15/2012   Anticipated DC Plan:  HOME/SELF CARE      DC Planning Services  CM consult      Choice offered to / List presented to:             Status of service:  In process, will continue to follow Medicare Important Message given?   (If response is "NO", the following Medicare IM given date fields will be blank) Date Medicare IM given:   Date Additional Medicare IM given:    Discharge Disposition:    Per UR Regulation:  Reviewed for med. necessity/level of care/duration of stay  If discussed at Long Length of Stay Meetings, dates discussed:    Comments:  09/15/12  1011  Jodene Polyak SIMMONS RN, BSN [phone] NCM WILL FOLLOW.

## 2012-09-16 ENCOUNTER — Other Ambulatory Visit: Payer: Self-pay

## 2012-09-16 ENCOUNTER — Other Ambulatory Visit: Payer: Self-pay | Admitting: Family Medicine

## 2012-09-16 ENCOUNTER — Other Ambulatory Visit: Payer: Self-pay | Admitting: Sports Medicine

## 2012-09-16 DIAGNOSIS — Z1231 Encounter for screening mammogram for malignant neoplasm of breast: Secondary | ICD-10-CM

## 2012-09-17 NOTE — Discharge Summary (Signed)
I discussed with Dr Hiram Gash.  I agree with their plans documented in their dicharge note.

## 2012-09-21 ENCOUNTER — Encounter: Payer: Self-pay | Admitting: Family Medicine

## 2012-09-21 ENCOUNTER — Ambulatory Visit (INDEPENDENT_AMBULATORY_CARE_PROVIDER_SITE_OTHER): Payer: Medicare Other | Admitting: Family Medicine

## 2012-09-21 VITALS — BP 150/62 | HR 79 | Ht 62.0 in | Wt 166.4 lb

## 2012-09-21 DIAGNOSIS — E7800 Pure hypercholesterolemia, unspecified: Secondary | ICD-10-CM

## 2012-09-21 DIAGNOSIS — E119 Type 2 diabetes mellitus without complications: Secondary | ICD-10-CM

## 2012-09-21 DIAGNOSIS — R079 Chest pain, unspecified: Secondary | ICD-10-CM

## 2012-09-21 DIAGNOSIS — I10 Essential (primary) hypertension: Secondary | ICD-10-CM

## 2012-09-21 NOTE — Assessment & Plan Note (Signed)
Encouraged her to start statin

## 2012-09-21 NOTE — Assessment & Plan Note (Signed)
Above goal today.  Appears to have been close to goal in hospital.  Unclear what medicines she is taking.  Will follow-u pin 2-3 weeks and ask her to bring all meds to office visit.

## 2012-09-21 NOTE — Patient Instructions (Addendum)
For diabetes: Stop glipizide Keep taking metformin Keep taking Lantus 10 units daily If you fasting blood sugars stay above 150, ok to increase Lantus by 2 units to 12 units a day.  For high blood pressure: You have 4 medicines: amlodipine, carvedilol, hydrochlorothiazide, Prinivil Check your blood pressure and write it down  Your goal is less than 130/80  For cholesterol:  the hospital sent you home with a new medicine: simvastatin  Follow-up in 2-3 weeks with your sugar log and bring all of your medicines

## 2012-09-21 NOTE — Assessment & Plan Note (Signed)
resolved 

## 2012-09-21 NOTE — Assessment & Plan Note (Signed)
Will d/c glipizide, continue metformin 1000 bid and continue lantus 10 with aims to increase to 12 if continues to remain elevated once she gets a glucometer.  Will follow-up in 2-3 weeks to assess understanding anc home CBG's

## 2012-09-21 NOTE — Progress Notes (Signed)
  Subjective:    Patient ID: [name], female    DOB: 10-17-1941, 71 y.o.   MRN: [id number]  HPI 71 yo here for hospital follow-up  Was admitted 9/24-9/25 for chest pain.  No evidence of ACS,  Was discharged home on new rx for simvastatin.  Has had no further episodes of chest pain.  During hospitalization it was found that patient has markedly elevated a1c: 10%, had not been regulalry engaging in primary care.  DIABETES  Taking and tolerating: no-states lost glucometer  Fasting blood sugars: unclear  Hypoglycemic symptoms: no documented but states she felt lightheaded once last week Visual problems: no Monitoring feet: yes Numbness/Tingling: no Last eye exam: unknown  Discussed why big difference in a1c since last year, patient states has not been taking meds as regularly. Diabetic Labs:  Lab Results  Component Value Date   HGBA1C 10.0* 09/14/2012   HGBA1C 8.6* 11/25/2011   HGBA1C 7.5 09/04/2011   Lab Results  Component Value Date   LDLCALC 123* 09/15/2012   CREATININE 0.85 09/15/2012   Last microalbumin: No results found for this basename: MICROALBUR, MALB24HUR   HYPERTENSION  She is unsure of which medicines she takes are for blood pressure but knows she has taken them all this morning.  Does not check home BP's  BP Readings from Last 3 Encounters:  09/21/12 150/62  09/15/12 143/69  03/12/12 169/80    Hypertension ROS: taking medications as instructed, no medication side effects noted, no TIA's, no chest pain on exertion, no dyspnea on exertion and no swelling of ankles.    HYPERLIPIDEMIA   Was started on simvastatin 20 during hospitalization, has picked it up but did not take it due to concern over side effect profile.   Wt Readings from Last 3 Encounters:  09/21/12 166 lb 6.4 oz (75.479 kg)  09/14/12 164 lb 10.9 oz (74.7 kg)  03/12/12 166 lb (75.297 kg)   ROS:  Denies RUQ pain, myalgias, or symptoms or coronary ischemia Lab Results  Component  Value Date   LDLCALC 123* 09/15/2012   Lab Results  Component Value Date   CHOL 195 09/15/2012   CHOL 137 01/05/2010   CHOL  Value: 95        ATP III CLASSIFICATION:  <200     mg/dL   Desirable  409-811  mg/dL   Borderline High  >=914    mg/dL   High        7/82/9562   Lab Results  Component Value Date   HDL 40 09/15/2012   HDL 47 01/22/8656   HDL 31* 03/14/2009   Lab Results  Component Value Date   TRIG 161* 09/15/2012   TRIG 91 01/05/2010   TRIG 102 03/14/2009   Lab Results  Component Value Date   ALT 28 09/14/2012   AST 28 09/14/2012   ALKPHOS 84 09/14/2012   BILITOT 0.5 09/14/2012       '  Review of Systems     Objective:   Physical Exam        Assessment & Plan:

## 2012-09-22 ENCOUNTER — Other Ambulatory Visit: Payer: Self-pay | Admitting: Sports Medicine

## 2012-10-12 ENCOUNTER — Ambulatory Visit: Payer: Medicare Other | Admitting: Family Medicine

## 2012-10-19 ENCOUNTER — Ambulatory Visit
Admission: RE | Admit: 2012-10-19 | Discharge: 2012-10-19 | Disposition: A | Discharge status: Home / Self Care | Payer: Medicare Other | Source: Ambulatory Visit | Attending: Family Medicine | Admitting: Family Medicine

## 2012-10-19 DIAGNOSIS — Z1231 Encounter for screening mammogram for malignant neoplasm of breast: Secondary | ICD-10-CM

## 2012-10-22 ENCOUNTER — Telehealth: Payer: Self-pay | Admitting: Family Medicine

## 2012-10-22 NOTE — Telephone Encounter (Signed)
Has a One Touch Ultra mini and needs script for her lancets and test strips [facility] [address]

## 2012-10-27 ENCOUNTER — Other Ambulatory Visit: Payer: Self-pay | Admitting: Family Medicine

## 2012-10-29 ENCOUNTER — Other Ambulatory Visit: Payer: Self-pay | Admitting: Family Medicine

## 2012-10-29 NOTE — Telephone Encounter (Signed)
Patient is calling for an Rx for her One Touch Ultra Mini meter.  She needs Test Strips and Lancets.  She uses [facility] on [address].

## 2012-11-02 ENCOUNTER — Encounter: Payer: Self-pay | Admitting: Home Health Services

## 2012-11-03 ENCOUNTER — Encounter: Payer: Self-pay | Admitting: Home Health Services

## 2012-12-10 ENCOUNTER — Other Ambulatory Visit: Payer: Self-pay | Admitting: Family Medicine

## 2013-01-07 ENCOUNTER — Encounter: Payer: Self-pay | Admitting: Family Medicine

## 2013-01-07 ENCOUNTER — Ambulatory Visit (INDEPENDENT_AMBULATORY_CARE_PROVIDER_SITE_OTHER): Payer: Medicare Other | Admitting: Family Medicine

## 2013-01-07 VITALS — BP 144/67 | HR 83 | Temp 99.1°F | Ht 62.0 in | Wt 159.8 lb

## 2013-01-07 DIAGNOSIS — N760 Acute vaginitis: Secondary | ICD-10-CM

## 2013-01-07 DIAGNOSIS — B373 Candidiasis of vulva and vagina: Secondary | ICD-10-CM

## 2013-01-07 DIAGNOSIS — E7800 Pure hypercholesterolemia, unspecified: Secondary | ICD-10-CM

## 2013-01-07 DIAGNOSIS — I10 Essential (primary) hypertension: Secondary | ICD-10-CM

## 2013-01-07 DIAGNOSIS — E1165 Type 2 diabetes mellitus with hyperglycemia: Secondary | ICD-10-CM

## 2013-01-07 DIAGNOSIS — I2510 Atherosclerotic heart disease of native coronary artery without angina pectoris: Secondary | ICD-10-CM

## 2013-01-07 LAB — POCT GLYCOSYLATED HEMOGLOBIN (HGB A1C): Hemoglobin A1C: 12.4

## 2013-01-07 LAB — POCT WET PREP (WET MOUNT)

## 2013-01-07 MED ORDER — FLUCONAZOLE 150 MG PO TABS: 150.0000 mg | ORAL_TABLET | Freq: Once | ORAL | Status: DC

## 2013-01-07 MED ORDER — PRAVASTATIN SODIUM 40 MG PO TABS: 40.0000 mg | ORAL_TABLET | Freq: Every day | ORAL | Status: DC

## 2013-01-07 MED ORDER — METFORMIN HCL ER 500 MG PO TB24: 2000.0000 mg | ORAL_TABLET | Freq: Every day | ORAL | Status: DC

## 2013-01-07 NOTE — Patient Instructions (Addendum)
Change metformin 1000 twice a day to metformin ER 500 mg 4 tabs per day.  Same dose but extended release formula so you can take all in the morning.  Increase lantus from 10 to 14.  If your fasting sugars stay above 150, ok to increase to 16 units each morning  We decided to stop Simvastatin and instead use Pravastatin for cholesterol  Follow-up in 3-4 weeks and bring all of your medicine bottle with you and you sugar log (check at least 4 times per week).  Ok to leave insulin at home  I will call you with results of yeast test

## 2013-01-07 NOTE — Assessment & Plan Note (Signed)
a1c deteriorated, even with introduction of lantus at last visit.  [name] with remembering evening dose of metformin.  Will change to XR at 2000 mg day. -Will titrate up lantus from 10 to 14 units.  Patient asked to check CBGs more regularly, ok to titrate up to 16 units is persistent cbg > 150 - FOllow-up in 3-4 weeks, asked to bring sugar log with her

## 2013-01-07 NOTE — Assessment & Plan Note (Signed)
Due for annual follow-up with cardiology.  Encouraged her to make this appointment

## 2013-01-07 NOTE — Addendum Note (Signed)
Addended by: Macy Mis on: 01/07/2013 11:39 AM   Modules accepted: Orders

## 2013-01-07 NOTE — Assessment & Plan Note (Addendum)
Fluconazole 150 mg, left message on voicemail for patient to call office back

## 2013-01-07 NOTE — Assessment & Plan Note (Signed)
She is reluctant to take simvastatin due to possible myalgias and concerns about side effects she has heard of.  Agreeable to changing to pravastatinn 40 mg nightly

## 2013-01-07 NOTE — Assessment & Plan Note (Signed)
Slightly above goal.  Asked patient to bring medication bottles at next visit in 3-4 weeks.  Is on multiple agents, would like to get a better sense of compliance.

## 2013-01-07 NOTE — Progress Notes (Signed)
  Subjective:    Patient ID: [name], female    DOB: June 16, 1941, 72 y.o.   MRN: [id number]  HPI  Vaginal discharge:  No odor or color.  Use a non-yeast vaginal cream but did not help.  Itching.  No recent abx or new soaps or detergents.  No pain.  Not sexually active.  Would like me to look at vaginal prolapse.  Has been there for along time.  Does not cause discomfort or urinary or GI symptoms.  DIABETES  Taking and tolerating: yes- Metformin 1000 mg twice a day, but taking once a day.  Taking Lantus 10 units qam, daily.   Fasting blood sugars:sometimes, perhaps in 150's, but not sure.   Hypoglycemic symptoms: no Visual problems: no Monitoring feet: yes Numbness/Tingling: no Last eye exam:December 2013 Diabetic Labs:  Today is >12% Lab Results  Component Value Date   HGBA1C 10.0* 09/14/2012   HGBA1C 8.6* 11/25/2011   HGBA1C 7.5 09/04/2011   Lab Results  Component Value Date   LDLCALC 123* 09/15/2012   CREATININE 0.85 09/15/2012   Last microalbumin: No results found for this basename: MICROALBUR, MALB24HUR    HYPERTENSION  BP Readings from Last 3 Encounters:  01/07/13 144/67  09/21/12 150/62  09/15/12 143/69    Hypertension ROS: taking medications as instructed, no medication side effects noted, no chest pain on exertion, no dyspnea on exertion and no swelling of ankles.   HYPERLIPIDEMIA  Diet: Not following low cholesterol diet and not taking statin Exercise: No regular exercise Wt Readings from Last 3 Encounters:  01/07/13 159 lb 12.8 oz (72.485 kg)  09/21/12 166 lb 6.4 oz (75.479 kg)  09/14/12 164 lb 10.9 oz (74.7 kg)   ROS:  Denies RUQ pain, myalgias, or symptoms or coronary ischemia Lab Results  Component Value Date   LDLCALC 123* 09/15/2012   Lab Results  Component Value Date   CHOL 195 09/15/2012   CHOL 137 01/05/2010   CHOL  Value: 95        ATP III CLASSIFICATION:  <200     mg/dL   Desirable  147-829  mg/dL   Borderline High  >=562    mg/dL    High        01/22/8656   Lab Results  Component Value Date   HDL 40 09/15/2012   HDL 47 8/46/9629   HDL 31* 03/14/2009   Lab Results  Component Value Date   TRIG 161* 09/15/2012   TRIG 91 01/05/2010   TRIG 102 03/14/2009   Lab Results  Component Value Date   ALT 28 09/14/2012   AST 28 09/14/2012   ALKPHOS 84 09/14/2012   BILITOT 0.5 09/14/2012          Review of Systems     Objective:   Physical Exam GEN: Alert & Oriented, No acute distress CV:  Regular Rate & Rhythm, no murmur Respiratory:  Normal work of breathing, CTAB Abd:  + BS, soft, no tenderness to palpation Ext: no pre-tibial edema Pelvic Exam:        External: normal female genitalia without lesions or masses        Vagina:vaginal prolapse noted. At introitus with cough        Samples for Wet prep      Assessment & Plan:

## 2013-01-08 ENCOUNTER — Telehealth: Payer: Self-pay | Admitting: Family Medicine

## 2013-01-08 NOTE — Telephone Encounter (Signed)
Please let patient know I sent in Rx for fluconazole for yeast vaginitis.

## 2013-01-08 NOTE — Telephone Encounter (Signed)
LVM for patient to call back. ?

## 2013-01-08 NOTE — Telephone Encounter (Signed)
Pt rec'd message from Dr Earnest Bailey to call about her test results - she is returning her call

## 2013-01-13 ENCOUNTER — Other Ambulatory Visit (HOSPITAL_COMMUNITY): Payer: Self-pay | Admitting: Family Medicine

## 2013-01-13 ENCOUNTER — Other Ambulatory Visit: Payer: Self-pay | Admitting: Family Medicine

## 2013-02-01 ENCOUNTER — Ambulatory Visit (INDEPENDENT_AMBULATORY_CARE_PROVIDER_SITE_OTHER): Payer: Medicare PPO | Admitting: Family Medicine

## 2013-02-01 ENCOUNTER — Encounter: Payer: Self-pay | Admitting: Family Medicine

## 2013-02-01 VITALS — BP 117/69 | HR 80 | Ht 62.5 in | Wt 159.0 lb

## 2013-02-01 DIAGNOSIS — I10 Essential (primary) hypertension: Secondary | ICD-10-CM

## 2013-02-01 DIAGNOSIS — IMO0002 Reserved for concepts with insufficient information to code with codable children: Secondary | ICD-10-CM

## 2013-02-01 DIAGNOSIS — E1165 Type 2 diabetes mellitus with hyperglycemia: Secondary | ICD-10-CM

## 2013-02-01 DIAGNOSIS — E7800 Pure hypercholesterolemia, unspecified: Secondary | ICD-10-CM

## 2013-02-01 DIAGNOSIS — IMO0001 Reserved for inherently not codable concepts without codable children: Secondary | ICD-10-CM

## 2013-02-01 MED ORDER — ONETOUCH ULTRASOFT LANCETS MISC: Status: DC

## 2013-02-01 NOTE — Assessment & Plan Note (Signed)
Well controlled today.

## 2013-02-01 NOTE — Assessment & Plan Note (Signed)
New start to pravastatin doing well.  Continue pravastatin.

## 2013-02-01 NOTE — Patient Instructions (Addendum)
Goal fasting blood sugar first thing in the morning- less than 125, more than 80.  Increase lantus to 18 units each morning.    If morning in blood sugars are greater than 150 for several days, ok to go up to 20 units each morning.  If morning blood sugars still stay above 150 for several more days, ok to go up to 22 units.  If you have any numbers less than 80, call and let me know.  Let's follow-up mid April to recheck your diabetes  Thanks for bringing your medicines and BP and sugar log.

## 2013-02-01 NOTE — Assessment & Plan Note (Signed)
hom glucose log shows continued markedly elevated glucoses.  Will increase lantus 14 to 18 untis daily.  Discussed titration up by 2 units if fasting cbgs stay above 150.  HAs good understanding on self titration.  Will follow-up in 2 months

## 2013-02-01 NOTE — Progress Notes (Signed)
  Subjective:    Patient ID: [name], female    DOB: March 29, 1941, 72 y.o.   MRN: [id number]  HPIHere for follow-up chronic medical conditoins  DIABETES  Taking and tolerating: yes- Lantus 14 unit qam and metformin 100 mg daily, plans to change to Xr 200 mg daily once she runs out of these Fasting blood sugars: [id number]- average about high 180-220's  Before meals and before bedtiems numbers slightly higher on average Hypoglycemic symptoms: no Visual problems: no Monitoring feet: yes Numbness/Tingling: no  Diabetic Labs:  Lab Results  Component Value Date   HGBA1C 12.4 01/07/2013   HGBA1C 10.0* 09/14/2012   HGBA1C 8.6* 11/25/2011   Lab Results  Component Value Date   LDLCALC 123* 09/15/2012   CREATININE 0.85 09/15/2012   Last microalbumin: No results found for this basename: MICROALBUR, [id number]   HYPERTENSION  BP Readings from Last 3 Encounters:  02/01/13 117/69  01/07/13 144/67  09/21/12 150/62    Hypertension ROS: taking medications as instructed, no medication side effects noted, no TIA's, no chest pain on exertion, no dyspnea on exertion and no swelling of ankles.    HYPERLIPIDEMIA  At last visit was started on pravastatin- patient reports compliance and tolerating it well  Wt Readings from Last 3 Encounters:  02/01/13 159 lb (72.122 kg)  01/07/13 159 lb 12.8 oz (72.485 kg)  09/21/12 166 lb 6.4 oz (75.479 kg)   ROS:  Denies RUQ pain, myalgias, or symptoms or coronary ischemia Lab Results  Component Value Date   LDLCALC 123* 09/15/2012   Lab Results  Component Value Date   CHOL 195 09/15/2012   CHOL 137 01/05/2010   CHOL  Value: 95        ATP III CLASSIFICATION:  <200     mg/dL   Desirable  102-725  mg/dL   Borderline High  >=366    mg/dL   High        4/40/3474   Lab Results  Component Value Date   HDL 40 09/15/2012   HDL 47 2/59/5638   HDL 31* 03/14/2009   Lab Results  Component Value Date   TRIG 161* 09/15/2012   TRIG 91 01/05/2010   TRIG 102  03/14/2009   Lab Results  Component Value Date   ALT 28 09/14/2012   AST 28 09/14/2012   ALKPHOS 84 09/14/2012   BILITOT 0.5 09/14/2012         Review of Systems see HPI     Objective:   Physical Exam GEN: Alert & Oriented, No acute distress CV:  Regular Rate & Rhythm, no murmur Respiratory:  Normal work of breathing, CTAB Abd:  + BS, soft, no tenderness to palpation Ext: no pre-tibial edema        Assessment & Plan:

## 2013-03-05 ENCOUNTER — Emergency Department (HOSPITAL_COMMUNITY): Payer: Medicare PPO

## 2013-03-05 ENCOUNTER — Inpatient Hospital Stay (HOSPITAL_COMMUNITY)
Admission: EM | Admit: 2013-03-05 | Discharge: 2013-03-07 | DRG: 313 | Disposition: A | Discharge status: Home / Self Care | Payer: Medicare PPO | Attending: Family Medicine | Admitting: Family Medicine

## 2013-03-05 ENCOUNTER — Encounter (HOSPITAL_COMMUNITY): Payer: Self-pay | Admitting: Cardiology

## 2013-03-05 DIAGNOSIS — D638 Anemia in other chronic diseases classified elsewhere: Secondary | ICD-10-CM | POA: Diagnosis present

## 2013-03-05 DIAGNOSIS — D509 Iron deficiency anemia, unspecified: Secondary | ICD-10-CM | POA: Diagnosis present

## 2013-03-05 DIAGNOSIS — Z85850 Personal history of malignant neoplasm of thyroid: Secondary | ICD-10-CM

## 2013-03-05 DIAGNOSIS — J309 Allergic rhinitis, unspecified: Secondary | ICD-10-CM | POA: Diagnosis present

## 2013-03-05 DIAGNOSIS — Z87891 Personal history of nicotine dependence: Secondary | ICD-10-CM

## 2013-03-05 DIAGNOSIS — Z7982 Long term (current) use of aspirin: Secondary | ICD-10-CM

## 2013-03-05 DIAGNOSIS — R0789 Other chest pain: Principal | ICD-10-CM | POA: Diagnosis present

## 2013-03-05 DIAGNOSIS — Z794 Long term (current) use of insulin: Secondary | ICD-10-CM

## 2013-03-05 DIAGNOSIS — Z885 Allergy status to narcotic agent status: Secondary | ICD-10-CM

## 2013-03-05 DIAGNOSIS — E785 Hyperlipidemia, unspecified: Secondary | ICD-10-CM | POA: Diagnosis present

## 2013-03-05 DIAGNOSIS — Z9861 Coronary angioplasty status: Secondary | ICD-10-CM

## 2013-03-05 DIAGNOSIS — M25519 Pain in unspecified shoulder: Secondary | ICD-10-CM | POA: Diagnosis present

## 2013-03-05 DIAGNOSIS — IMO0002 Reserved for concepts with insufficient information to code with codable children: Secondary | ICD-10-CM

## 2013-03-05 DIAGNOSIS — I10 Essential (primary) hypertension: Secondary | ICD-10-CM | POA: Diagnosis present

## 2013-03-05 DIAGNOSIS — Z9089 Acquired absence of other organs: Secondary | ICD-10-CM

## 2013-03-05 DIAGNOSIS — E890 Postprocedural hypothyroidism: Secondary | ICD-10-CM | POA: Diagnosis present

## 2013-03-05 DIAGNOSIS — I2510 Atherosclerotic heart disease of native coronary artery without angina pectoris: Secondary | ICD-10-CM | POA: Diagnosis present

## 2013-03-05 DIAGNOSIS — D649 Anemia, unspecified: Secondary | ICD-10-CM

## 2013-03-05 DIAGNOSIS — Z79899 Other long term (current) drug therapy: Secondary | ICD-10-CM

## 2013-03-05 DIAGNOSIS — E1129 Type 2 diabetes mellitus with other diabetic kidney complication: Secondary | ICD-10-CM | POA: Diagnosis present

## 2013-03-05 DIAGNOSIS — G5600 Carpal tunnel syndrome, unspecified upper limb: Secondary | ICD-10-CM | POA: Diagnosis present

## 2013-03-05 DIAGNOSIS — E7800 Pure hypercholesterolemia, unspecified: Secondary | ICD-10-CM | POA: Diagnosis present

## 2013-03-05 DIAGNOSIS — I279 Pulmonary heart disease, unspecified: Secondary | ICD-10-CM | POA: Diagnosis present

## 2013-03-05 DIAGNOSIS — IMO0001 Reserved for inherently not codable concepts without codable children: Secondary | ICD-10-CM | POA: Diagnosis present

## 2013-03-05 DIAGNOSIS — K219 Gastro-esophageal reflux disease without esophagitis: Secondary | ICD-10-CM | POA: Diagnosis present

## 2013-03-05 DIAGNOSIS — R0989 Other specified symptoms and signs involving the circulatory and respiratory systems: Secondary | ICD-10-CM | POA: Diagnosis present

## 2013-03-05 DIAGNOSIS — I252 Old myocardial infarction: Secondary | ICD-10-CM

## 2013-03-05 DIAGNOSIS — Z882 Allergy status to sulfonamides status: Secondary | ICD-10-CM

## 2013-03-05 DIAGNOSIS — E039 Hypothyroidism, unspecified: Secondary | ICD-10-CM | POA: Diagnosis present

## 2013-03-05 DIAGNOSIS — R079 Chest pain, unspecified: Secondary | ICD-10-CM | POA: Diagnosis present

## 2013-03-05 LAB — CBC
HCT: 32.7 % — ABNORMAL LOW (ref 36.0–46.0)
Hemoglobin: 10.8 g/dL — ABNORMAL LOW (ref 12.0–15.0)
MCV: 79.8 fL (ref 78.0–100.0)
RBC: 4.1 MIL/uL (ref 3.87–5.11)
RDW: 15 % (ref 11.5–15.5)
WBC: 6.5 10*3/uL (ref 4.0–10.5)

## 2013-03-05 LAB — BASIC METABOLIC PANEL
BUN: 19 mg/dL (ref 6–23)
CO2: 29 mEq/L (ref 19–32)
Chloride: 101 mEq/L (ref 96–112)
Creatinine, Ser: 0.77 mg/dL (ref 0.50–1.10)
GFR calc Af Amer: >90 mL/min (ref >90)
Glucose, Bld: 213 mg/dL — ABNORMAL HIGH (ref 70–99)
Potassium: 4.1 mEq/L (ref 3.5–5.1)

## 2013-03-05 MED ORDER — ASPIRIN 81 MG PO CHEW
243.0000 mg | CHEWABLE_TABLET | Freq: Once | ORAL | Status: AC
  Administered 2013-03-05: 243 mg via ORAL
  Filled 2013-03-05: qty 3

## 2013-03-05 MED ORDER — MORPHINE SULFATE 4 MG/ML IJ SOLN
4.0000 mg | Freq: Once | INTRAMUSCULAR | Status: AC
  Administered 2013-03-05: 4 mg via INTRAVENOUS
  Filled 2013-03-05: qty 1

## 2013-03-05 NOTE — ED Provider Notes (Signed)
History     CSN: [id number]  Arrival date & time 03/05/13  1713   First MD Initiated Contact with Patient 03/05/13 1946      Chief Complaint  Patient presents with  . Chest Pain  . Shoulder Pain    (Consider location/radiation/quality/duration/timing/severity/associated sxs/prior treatment) HPI Complains of anterior chest pain radiating to right shoulder onset yesterday. Pain is sharp lasting 1-2 minutes at a time she presently complains of a dull pressure between her breasts. She is uncertain if this feels like "heart pain" she's had in the past. She denies shortness of breath admits to intermittent nausea no vomiting. Pain is worse with changing position or exertion improved by remaining still. No treatment prior to coming here . Discomfort minimal at present. Past Medical History  Diagnosis Date  . History of thyroid cancer   . ETD (eustachian tube dysfunction)   . CTS (carpal tunnel syndrome)   . Anemia   . Gastritis   . Postmenopausal   . Chest wall pain   . Hypothyroid   . Screening for malignant neoplasm of cervix   . Cor pulmonale   . Acute URI   . Hypertension, benign   . Diabetes mellitus, type II   . Hypercholesteremia   . Angina   . GERD (gastroesophageal reflux disease)   . Myocardial infarction   . Coronary artery disease     Past Surgical History  Procedure Laterality Date  . Angioplasty      PDA 90-40 %  . Thyroidectomy    . Cholecystectomy  08-23-2002  . Endometrial biopsy  02-20-1998  . Bartholin gland cyst excision  09-13-2004    Keratosis- no CA  . Ptca  10/23/2000 and 03/15/09  . Cardiac catheterization  03/15/2009    stenosis of diagonal branch of LAD- PTCA performed  . Coronary angioplasty    . Tonsillectomy    . Breast biopsy      LEFT SPOT REMOVED  . Tubal ligation      Family History  Problem Relation Age of Onset  . Diabetes Mother     Deceased- hypoglycemic coma   . Hypertension Mother     History  Substance Use Topics  . Smoking  status: Former Smoker    Types: Cigarettes    Quit date: 12/23/1980  . Smokeless tobacco: Former Neurosurgeon    Types: Chew    Quit date: 08/23/1994  . Alcohol Use: No    OB History   Grav Para Term Preterm Abortions TAB SAB Ect Mult Living                  Review of Systems  Constitutional: Negative.   HENT: Negative.   Respiratory: Positive for chest tightness.   Cardiovascular: Negative.   Gastrointestinal: Positive for nausea.  Musculoskeletal: Negative.   Skin: Negative.   Neurological: Negative.   Psychiatric/Behavioral: Negative.   All other systems reviewed and are negative.    Allergies  Codeine and Sulfamethoxazole w-trimethoprim  Home Medications   Current Outpatient Rx  Name  Route  Sig  Dispense  Refill  . amLODipine (NORVASC) 10 MG tablet   Oral   Take 1 tablet (10 mg total) by mouth daily.   90 tablet   11   . aspirin 81 MG chewable tablet   Oral   Chew 81 mg by mouth daily.           . carvedilol (COREG) 25 MG tablet      TAKE ONE TABLET BY  MOUTH TWICE DAILY   60 tablet   10   . fluticasone (FLONASE) 50 MCG/ACT nasal spray   Nasal   Place 2 sprays into the nose daily as needed. For allergies         . hydrochlorothiazide (HYDRODIURIL) 25 MG tablet      TAKE ONE TABLET BY MOUTH EVERY DAY   90 tablet   10   . insulin glargine (LANTUS) 100 UNIT/ML injection   Subcutaneous   Inject 14 Units into the skin every morning.          . isosorbide mononitrate (IMDUR) 60 MG 24 hr tablet   Oral   Take 60 mg by mouth daily.         [name] levothyroxine (SYNTHROID, LEVOTHROID) 50 MCG tablet   Oral   Take 50 mcg by mouth daily.         [name] lisinopril (PRINIVIL,ZESTRIL) 20 MG tablet   Oral   Take 40 mg by mouth daily.         [name] loratadine-pseudoephedrine (CLARITIN-D 24-HOUR) 10-240 MG per 24 hr tablet   Oral   Take 1 tablet by mouth daily as needed. For allergies         . metFORMIN (GLUCOPHAGE) 1000 MG tablet   Oral   Take 1,000 mg  by mouth 2 (two) times daily with a meal.         . nitroGLYCERIN (NITROSTAT) 0.4 MG SL tablet   Sublingual   Place 1 tablet (0.4 mg total) under the tongue every 5 (five) minutes as needed for chest pain.   90 tablet   3   . pravastatin (PRAVACHOL) 40 MG tablet   Oral   Take 1 tablet (40 mg total) by mouth daily.   30 tablet   3   . Blood Glucose Monitoring Suppl (ONE TOUCH ULTRA MINI) W/DEVICE KIT      AS DIRECTED   1 each   0   . Insulin Syringe-Needle U-100 (INSULIN SYRINGE 1CC/31GX5/16") 31G X 5/16" 1 ML MISC   Does not apply   1 application by Does not apply route 1 day or 1 dose.   100 each   6   . Lancets (ONETOUCH ULTRASOFT) lancets      Check blood sugars tid with meals and before bed   200 each   12   . metFORMIN (GLUCOPHAGE-XR) 500 MG 24 hr tablet   Oral   Take 4 tablets (2,000 mg total) by mouth daily with breakfast.   120 tablet   5     BP 145/57  Pulse 70  Temp(Src) 98.4 F (36.9 C) (Oral)  Resp 22  SpO2 100%  LMP 11/24/1992  Physical Exam  Nursing note and vitals reviewed. Constitutional: She appears well-developed and well-nourished.  HENT:  Head: Normocephalic and atraumatic.  Eyes: Conjunctivae are normal. Pupils are equal, round, and reactive to light.  Neck: Neck supple. No tracheal deviation present. No thyromegaly present.  Cardiovascular: Normal rate and regular rhythm.   No murmur heard. Pulmonary/Chest: Effort normal and breath sounds normal.  Abdominal: Soft. Bowel sounds are normal. She exhibits no distension. There is no tenderness.  Musculoskeletal: Normal range of motion. She exhibits no edema and no tenderness.  Neurological: She is alert. Coordination normal.  Skin: Skin is warm and dry. No rash noted.  Psychiatric: She has a normal mood and affect.    ED Course  Procedures (including critical care time)  Labs Reviewed  CBC - Abnormal;  Notable for the following:    Hemoglobin 10.8 (*)    HCT 32.7 (*)    All  other components within normal limits  BASIC METABOLIC PANEL - Abnormal; Notable for the following:    Glucose, Bld 213 (*)    GFR calc non Af Amer 83 (*)    All other components within normal limits  POCT I-STAT TROPONIN I   Dg Chest 2 View  03/05/2013  *RADIOLOGY REPORT*  Clinical Data: Chest pain  CHEST - 2 VIEW  Comparison: 05/14/2012 and prior chest radiographs  Findings: The cardiomediastinal silhouette is unremarkable. The lungs are clear. There is no evidence of focal airspace disease, pulmonary edema, suspicious pulmonary nodule/mass, pleural effusion, or pneumothorax. No acute bony abnormalities are identified. Surgical clips in the lower right neck and right upper abdomen again noted.  IMPRESSION: No evidence of acute cardiopulmonary disease.   Original Report Authenticated By: Harmon Pier, M.D.      No diagnosis found.   Date: 03/05/2013  Rate: 75  Rhythm: normal sinus rhythm  QRS Axis: normal  Intervals: normal  ST/T Wave abnormalities: Inferior wall ischemic changes  Conduction Disutrbances:none  Narrative Interpretation:   Old EKG Reviewed: Unchanged from 09/14/2012 interpreted by me 9:35 PM patient asymptomatic and pain-free after treatment with intravenous morphine and aspirin. Chest xray reviwed by me Results for orders placed during the hospital encounter of 03/05/13  CBC      Result Value Range   WBC 6.5  4.0 - 10.5 K/uL   RBC 4.10  3.87 - 5.11 MIL/uL   Hemoglobin 10.8 (*) 12.0 - 15.0 g/dL   HCT 16.1 (*) 09.6 - 04.5 %   MCV 79.8  78.0 - 100.0 fL   MCH 26.3  26.0 - 34.0 pg   MCHC 33.0  30.0 - 36.0 g/dL   RDW 40.9  81.1 - 91.4 %   Platelets 151  150 - 400 K/uL  BASIC METABOLIC PANEL      Result Value Range   Sodium 140  135 - 145 mEq/L   Potassium 4.1  3.5 - 5.1 mEq/L   Chloride 101  96 - 112 mEq/L   CO2 29  19 - 32 mEq/L   Glucose, Bld 213 (*) 70 - 99 mg/dL   BUN 19  6 - 23 mg/dL   Creatinine, Ser 7.82  0.50 - 1.10 mg/dL   Calcium 95.6  8.4 - 21.3 mg/dL    GFR calc non Af Amer 83 (*) >90 mL/min   GFR calc Af Amer >90  >90 mL/min  POCT I-STAT TROPONIN I      Result Value Range   Troponin i, poc 0.00  0.00 - 0.08 ng/mL   Comment 3            Dg Chest 2 View  03/05/2013  *RADIOLOGY REPORT*  Clinical Data: Chest pain  CHEST - 2 VIEW  Comparison: 05/14/2012 and prior chest radiographs  Findings: The cardiomediastinal silhouette is unremarkable. The lungs are clear. There is no evidence of focal airspace disease, pulmonary edema, suspicious pulmonary nodule/mass, pleural effusion, or pneumothorax. No acute bony abnormalities are identified. Surgical clips in the lower right neck and right upper abdomen again noted.  IMPRESSION: No evidence of acute cardiopulmonary disease.   Original Report Authenticated By: Harmon Pier, M.D.     MDM  Spoke with Dr. Durene Cal who will arrange for 23 hour observation or admission Diagnosis #`chest pain #2 hyperglycemia       Doug Sou, MD  03/05/13 2140 

## 2013-03-05 NOTE — H&P (Signed)
Family Medicine [facility] Admission History and Physical Service Pager: [phone]  Patient name: [name] Medical record number: [id number] Date of birth: 1941-02-08 Age: 72 y.o. Gender: female  Primary Care Enrrique Mierzwa: Delbert Harness, MD Primary Cardiologist: Charlton Haws, MD  Chief Complaint: chest pain   Assessment and Plan: [name] is a 72 y.o. year old female presenting with chest pain.   #. Chest pain-atypical as right sided primarily. Nonexertional. Rest helps but primarily but not moving. This is most likely MSK although not fully reproducible on exam. Doubt PE, doubt ACS, doubt PNA/ptx.   -active chest pain: no, relieved by morphine 4mg . Was never given NTG.   -nitroglycerin prn for pain.   -TIMI score: 4-5, Wells criteria: 0   -cards consult: will consider in AM ([place]). Patient admitted for similar issue in September and didn't follow up as instructed and was told again in January to follow up. Stressing importance by scheduling visit may aid in getting patient to follow up but will discuss with team.   -admit to telemetry bed  -EKG: t wave inversions or flattening in inferior and anterolateral unchanged form previous 09/15/12. AM EKG ordered.   -Troponin x2. I stat negative.   -Check BNP. Does not appear overtly fluid overloaded but will check. Daily weights and i/o.   -Risk stratification labs:  tsh, fasting lipids (last lipids September and uncertain compliance) . Last a1c 12.4 within 2 months.   -Meds: continue ASA, beta blocker   # DM-poorly controlled. 1/2 home lantus at 7 units and placed on sliding scale. Patient did not titrate up lantus as previously instructed. Hold home metformin #HLD-continue statin # hypertension-continue amlodipine, carvedilol (unclear why home dose was 25 once daily), imdur, lisinopril. Held HCTZ for now.  # Hypothyroidism-continue home levothyroxine, check TSH #mild anemia at 10.8-near baseline of 11, continue  to monitor.   1. FEN/GI: carb modified 2. Prophylaxis: heparin 3. Disposition: observation status. Possibly home tomorrow if rules out for MI vs. Further workup and cards consult.  4. Code Status: Full Code  History of Present Illness: [name] is a 72 y.o. year old female presenting with chest pain.   Stent in 2010 by Dr. Eden Emms. Unremarkable cardiac cath 11/25/11.Hospitalized in September for chest pain and told to follow up but has not done that since that time.   Yesterday right shoulder with some vague pain like a muscle strain. Tylenol helped pain. Shoulder still hurting and then right chest started hurting. Increased to 10/10 located right side of chest but also over her sternum. No other radiation. Described as a pressure. Slightly nauseous. No vomiting or diaphoresis. No shortness of breath. Feels like a pulling if she sits up. If she walks down the hall, not painful. Twisting also painful. Even at rest it can hurt but usually less. Pain constant all day. No treatment tried. Previously had nitroglycerin but none today. Has had heart attack before and dnoes not feel similar.   Given 3 asa and no nitroglycerin in ED. EKG with t wave inversions unchanged from previous. Complete resolution of pain with morphine.   Patient Active Problem List  Diagnosis  . HYPOTHYROIDISM, UNSPECIFIED  . Diabetes mellitus out of control  . HYPERCHOLESTEROLEMIA  . ANEMIA, OTHER, UNSPECIFIED  . CARPAL TUNNEL SYNDROME  . HYPERTENSION, BENIGN SYSTEMIC  . CORONARY, ARTERIOSCLEROSIS  . COR PULMONALE  . Allergic rhinitis   Past Medical History: Past Medical History  Diagnosis Date  . History of thyroid cancer   .  ETD (eustachian tube dysfunction)   . CTS (carpal tunnel syndrome)   . Anemia   . Gastritis   . Postmenopausal   . Chest wall pain   . Hypothyroid   . Screening for malignant neoplasm of cervix   . Cor pulmonale   . Acute URI   . Hypertension, benign   . Diabetes mellitus, type  II   . Hypercholesteremia   . Angina   . GERD (gastroesophageal reflux disease)   . Myocardial infarction   . Coronary artery disease    Past Surgical History: Past Surgical History  Procedure Laterality Date  . Angioplasty      PDA 90-40 %  . Thyroidectomy    . Cholecystectomy  08-23-2002  . Endometrial biopsy  02-20-1998  . Bartholin gland cyst excision  09-13-2004    Keratosis- no CA  . Ptca  10/23/2000 and 03/15/09  . Cardiac catheterization  03/15/2009    stenosis of diagonal branch of LAD- PTCA performed  . Coronary angioplasty    . Tonsillectomy    . Breast biopsy      LEFT SPOT REMOVED  . Tubal ligation     Social History: History  Substance Use Topics  . Smoking status: Former Smoker    Types: Cigarettes    Quit date: 12/23/1980  . Smokeless tobacco: Former Neurosurgeon    Types: Chew    Quit date: 08/23/1994  . Alcohol Use: No   For any additional social history documentation, please refer to relevant sections of EMR.  Family History: Family History  Problem Relation Age of Onset  . Diabetes Mother     Deceased- hypoglycemic coma   . Hypertension Mother    Allergies: Allergies  Allergen Reactions  . Codeine     REACTION: GI Intolerance  . Sulfamethoxazole W-Trimethoprim     REACTION: Gi intolerence   Current Facility-Administered Medications on File Prior to Encounter  Medication Dose Route Frequency Makai Agostinelli Last Rate Last Dose  . nitroGLYCERIN (NITROSTAT) SL tablet 0.4 mg  0.4 mg Sublingual Once Dayarmys Piloto de Criselda Peaches, MD       Current Outpatient Prescriptions on File Prior to Encounter  Medication Sig Dispense Refill  . amLODipine (NORVASC) 10 MG tablet Take 1 tablet (10 mg total) by mouth daily.  90 tablet  11  . aspirin 81 MG chewable tablet Chew 81 mg by mouth daily.        . carvedilol (COREG) 25 MG tablet TAKE ONE TABLET BY MOUTH TWICE DAILY  60 tablet  10  . fluticasone (FLONASE) 50 MCG/ACT nasal spray Place 2 sprays into the nose daily as needed.  For allergies      . hydrochlorothiazide (HYDRODIURIL) 25 MG tablet TAKE ONE TABLET BY MOUTH EVERY DAY  90 tablet  10  . insulin glargine (LANTUS) 100 UNIT/ML injection Inject 14 Units into the skin every morning.       . loratadine-pseudoephedrine (CLARITIN-D 24-HOUR) 10-240 MG per 24 hr tablet Take 1 tablet by mouth daily as needed. For allergies      . nitroGLYCERIN (NITROSTAT) 0.4 MG SL tablet Place 1 tablet (0.4 mg total) under the tongue every 5 (five) minutes as needed for chest pain.  90 tablet  3  . pravastatin (PRAVACHOL) 40 MG tablet Take 1 tablet (40 mg total) by mouth daily.  30 tablet  3  . Blood Glucose Monitoring Suppl (ONE TOUCH ULTRA MINI) W/DEVICE KIT AS DIRECTED  1 each  0  . Insulin Syringe-Needle U-100 (INSULIN  SYRINGE 1CC/31GX5/16") 31G X 5/16" 1 ML MISC 1 application by Does not apply route 1 day or 1 dose.  100 each  6  . Lancets (ONETOUCH ULTRASOFT) lancets Check blood sugars tid with meals and before bed  200 each  12  . metFORMIN (GLUCOPHAGE-XR) 500 MG 24 hr tablet Take 4 tablets (2,000 mg total) by mouth daily with breakfast.  120 tablet  5   Review Of Systems: Per HPI  Otherwise 12 point review of systems was performed and was unremarkable.  Physical Exam: BP 119/52  Pulse 58  Temp(Src) 98.4 F (36.9 C) (Oral)  Resp 14  SpO2 95%  LMP 11/24/1992 Exam: Gen: NAD, resting comfortably in bed HEENT: NCAT, MMM, PERRLA  CV: RRR no mrg. No JVD. Slight tenderness to palpation throughout right upper chest.  Lungs: CTAB  Abd: soft/nontender/nondistended/normal bowel sounds  MSK: moves all extremities, trace edema  Skin: warm and dry, no rash  Neuro: grossly normal, alert and oriented x3.    Labs and Imaging: CBC BMET   Recent Labs Lab 03/05/13 1728  WBC 6.5  HGB 10.8*  HCT 32.7*  PLT 151    Recent Labs Lab 03/05/13 1728  NA 140  K 4.1  CL 101  CO2 29  BUN 19  CREATININE 0.77  GLUCOSE 213*  CALCIUM 10.1     POCT I-STAT TROPONIN I     Status:  None   Collection Time    03/05/13  5:42 PM      Result Value Range   Troponin i, poc 0.00  0.00 - 0.08 ng/mL   Comment 3            Dg Chest 2 View  03/05/2013  *RADIOLOGY REPORT*  Clinical Data: Chest pain  CHEST - 2 VIEW  Comparison: 05/14/2012 and prior chest radiographs  Findings: The cardiomediastinal silhouette is unremarkable. The lungs are clear. There is no evidence of focal airspace disease, pulmonary edema, suspicious pulmonary nodule/mass, pleural effusion, or pneumothorax. No acute bony abnormalities are identified. Surgical clips in the lower right neck and right upper abdomen again noted.  IMPRESSION: No evidence of acute cardiopulmonary disease.   Original Report Authenticated By: Harmon Pier, M.D.     Tana Conch, MD 03/05/2013, 8:38 PM

## 2013-03-05 NOTE — ED Notes (Signed)
Pt reports right shoulder pain that started yesterday and then began to have midsternal chest pain today. States she has SOB at times. Denies any n/v. Activity seems to make the pain worse.

## 2013-03-06 ENCOUNTER — Encounter (HOSPITAL_COMMUNITY): Payer: Self-pay | Admitting: Nurse Practitioner

## 2013-03-06 LAB — TROPONIN I: Troponin I: <0.3 ng/mL (ref <0.30)

## 2013-03-06 LAB — GLUCOSE, CAPILLARY
Glucose-Capillary: 147 mg/dL — ABNORMAL HIGH (ref 70–99)
Glucose-Capillary: 152 mg/dL — ABNORMAL HIGH (ref 70–99)

## 2013-03-06 LAB — LIPID PANEL
HDL: 44 mg/dL (ref >39)
LDL Cholesterol: 62 mg/dL (ref 0–99)

## 2013-03-06 LAB — TSH: TSH: 0.377 u[IU]/mL (ref 0.350–4.500)

## 2013-03-06 MED ORDER — LEVOTHYROXINE SODIUM 50 MCG PO TABS
50.0000 ug | ORAL_TABLET | Freq: Every day | ORAL | Status: DC
  Administered 2013-03-06 – 2013-03-07 (×2): 50 ug via ORAL
  Filled 2013-03-06 (×3): qty 1

## 2013-03-06 MED ORDER — ASPIRIN 81 MG PO CHEW
81.0000 mg | CHEWABLE_TABLET | Freq: Every day | ORAL | Status: DC
  Administered 2013-03-06 – 2013-03-07 (×2): 81 mg via ORAL
  Filled 2013-03-06 (×2): qty 1

## 2013-03-06 MED ORDER — ACETAMINOPHEN 325 MG PO TABS: 650.0000 mg | ORAL_TABLET | ORAL | Status: DC | PRN

## 2013-03-06 MED ORDER — SIMVASTATIN 20 MG PO TABS
20.0000 mg | ORAL_TABLET | Freq: Every day | ORAL | Status: DC
  Administered 2013-03-06: 20 mg via ORAL
  Filled 2013-03-06 (×2): qty 1

## 2013-03-06 MED ORDER — ISOSORBIDE MONONITRATE ER 60 MG PO TB24
60.0000 mg | ORAL_TABLET | Freq: Every day | ORAL | Status: DC
  Administered 2013-03-06 – 2013-03-07 (×2): 60 mg via ORAL
  Filled 2013-03-06 (×2): qty 1

## 2013-03-06 MED ORDER — ONDANSETRON HCL 4 MG/2ML IJ SOLN
4.0000 mg | Freq: Four times a day (QID) | INTRAMUSCULAR | Status: DC | PRN
  Administered 2013-03-06: 4 mg via INTRAVENOUS
  Filled 2013-03-06: qty 2

## 2013-03-06 MED ORDER — INSULIN ASPART 100 UNIT/ML ~~LOC~~ SOLN
0.0000 [IU] | Freq: Three times a day (TID) | SUBCUTANEOUS | Status: DC
  Administered 2013-03-06 (×3): 2 [IU] via SUBCUTANEOUS
  Administered 2013-03-07: 3 [IU] via SUBCUTANEOUS

## 2013-03-06 MED ORDER — AMLODIPINE BESYLATE 10 MG PO TABS
10.0000 mg | ORAL_TABLET | Freq: Every day | ORAL | Status: DC
  Administered 2013-03-06 – 2013-03-07 (×2): 10 mg via ORAL
  Filled 2013-03-06 (×2): qty 1

## 2013-03-06 MED ORDER — FLUTICASONE PROPIONATE 50 MCG/ACT NA SUSP
2.0000 | Freq: Every day | NASAL | Status: DC
  Filled 2013-03-06: qty 16

## 2013-03-06 MED ORDER — CARVEDILOL 12.5 MG PO TABS
12.5000 mg | ORAL_TABLET | Freq: Two times a day (BID) | ORAL | Status: DC
  Administered 2013-03-06 – 2013-03-07 (×3): 12.5 mg via ORAL
  Filled 2013-03-06 (×5): qty 1

## 2013-03-06 MED ORDER — NITROGLYCERIN 0.4 MG SL SUBL: 0.4000 mg | SUBLINGUAL_TABLET | SUBLINGUAL | Status: DC | PRN

## 2013-03-06 MED ORDER — HEPARIN SODIUM (PORCINE) 5000 UNIT/ML IJ SOLN
5000.0000 [IU] | Freq: Three times a day (TID) | INTRAMUSCULAR | Status: DC
  Administered 2013-03-06 – 2013-03-07 (×3): 5000 [IU] via SUBCUTANEOUS
  Filled 2013-03-06 (×8): qty 1

## 2013-03-06 MED ORDER — LISINOPRIL 40 MG PO TABS
40.0000 mg | ORAL_TABLET | Freq: Every day | ORAL | Status: DC
  Administered 2013-03-06 – 2013-03-07 (×2): 40 mg via ORAL
  Filled 2013-03-06 (×2): qty 1

## 2013-03-06 MED ORDER — INSULIN GLARGINE 100 UNIT/ML ~~LOC~~ SOLN
7.0000 [IU] | Freq: Every morning | SUBCUTANEOUS | Status: DC
  Administered 2013-03-06 – 2013-03-07 (×2): 7 [IU] via SUBCUTANEOUS

## 2013-03-06 NOTE — H&P (Signed)
[facility] Attending Admit Note Patient seen and examined by me, discussed with resident team and I agree with admission plan as per Dr Erasmo Leventhal note.  Briefly, 71yoF with known CAD who presented to ED with chest pain/pressure that began in R shoulder and radiated to central chest.  She reports some nausea initially with the pain, denies dyspnea, diaphoresis.  Remarks the pain is much improved from yesterday but still present.   On exam she is pleasant and appears comfortable as we talk; not in any acute distress. Chest wall: no reproducible pain in palpation over chest wall/sternum, nor with active/passive ROM testing of R shoulder.  Heart exam regular S1S2, without extra sounds.  Lungs sound clear bilaterally.  ABD soft, nontender.  EXTS without edema, erythema, or cords, no disparate calf girth.   Patient with chest pain that is not entirely resolved.  Has had serial troponins that are negative.  She has poorly controlled DM as evidenced by most recent A1C 12% in January. Agree with consulting cardiology for assessment and to guide on need for further chest pain workup during this hospitalization.  I agree that she is low risk for PE.  Aspirin. Paula Compton, MD

## 2013-03-06 NOTE — Progress Notes (Signed)
UR completed 

## 2013-03-06 NOTE — Consult Note (Signed)
CARDIOLOGY CONSULT NOTE  Patient ID: [name] MRN: [id number], DOB/AGE: August 31, 1941   Admit date: 03/05/2013 Date of Consult: 03/06/2013  Primary Physician: Delbert Harness, MD Primary Cardiologist: P. Eden Emms, MD  Pt. Profile  72 y/o female with prior h/o CAD who presented with right sided chest pain.  Problem List  Past Medical History  Diagnosis Date  . History of thyroid cancer   . ETD (eustachian tube dysfunction)   . CTS (carpal tunnel syndrome)   . Anemia   . Gastritis   . Postmenopausal   . Hypothyroid   . Cor pulmonale   . Hypertension, benign   . Diabetes mellitus, type II   . Hypercholesteremia   . GERD (gastroesophageal reflux disease)   . Coronary artery disease     a. Remote PTCA of the PDA;  b. 02/2009 PCI/CBA to the D1;  c. 11/2011 Cath: LM nl, LAD 20p, 80m, D1 patent, RI 20p, LCX nl, RCA 30d, EF 55-65%.    Past Surgical History  Procedure Laterality Date  . Angioplasty      PDA 90-40 %  . Thyroidectomy    . Cholecystectomy  08-23-2002  . Endometrial biopsy  02-20-1998  . Bartholin gland cyst excision  09-13-2004    Keratosis- no CA  . Ptca  10/23/2000 and 03/15/09  . Cardiac catheterization  03/15/2009    stenosis of diagonal branch of LAD- PTCA performed  . Coronary angioplasty    . Tonsillectomy    . Breast biopsy      LEFT SPOT REMOVED  . Tubal ligation      Allergies  Allergies  Allergen Reactions  . Codeine     REACTION: GI Intolerance  . Sulfamethoxazole W-Trimethoprim     REACTION: Gi intolerence   HPI   72 y/o female with the above problem list.  She has a h/o CAD and is s/p remote PCI of the PDA and more recently, cutting balloon angioplasty to the D1.  Cath in 11/2011 showed nonobs dzs.  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  She has been doing well and was in her usoh until this past Thursday when she awoke with mild right shoulder and arm discomfort.  This was not worsened with activity  or position changes.  There were no associated Ss.  Discomfort persisted throughout the day on Thursday and did not prevent her from working her Day Care job.  She took 2 extra strength tylenol at night and was able to fall asleep without any issues.  When she awoke yesterday morning, she again noted right shoulder discomfort and as the day went on, it moved to her right chest.  She again was able to complete her work chores at the Day Care center without limitations, though she did experience nausea.  Due to ongoing discomfort however, after work, she presented to the [facility] ED.  There, initial enzymes were negative.  ECG showed inf and antlat st dep with twi, which was slightly more pronounced in V5 & V6 than it had been in prior ECG's.  She was admitted by internal medicine and has since r/o for MI (despite > 24 hrs of chest pain).  Her pain finally abated late last night after receiving morphine, though it did return for some period of time this AM.  She is currently pain free.  Inpatient Medications  . amLODipine  10 mg Oral Daily  . aspirin  81 mg Oral Daily  .  carvedilol  12.5 mg Oral BID WC  . fluticasone  2 spray Each Nare Daily  . heparin  5,000 Units Subcutaneous Q8H  . insulin aspart  0-9 Units Subcutaneous TID WC  . insulin glargine  7 Units Subcutaneous q morning - 10a  . isosorbide mononitrate  60 mg Oral Daily  . levothyroxine  50 mcg Oral QAC breakfast  . lisinopril  40 mg Oral Daily  . simvastatin  20 mg Oral q1800   Family History Family History  Problem Relation Age of Onset  . Diabetes Mother     Deceased- hypoglycemic coma   . Hypertension Mother   . Other Father     unaware of his health history - died when pt was 72 yr old.    Social History History   Social History  . Marital Status: Widowed    Spouse Name: N/A    Number of Children: 6  . Years of Education: N/A   Occupational History  . Housekeeper [employer]   Social History Main Topics  . Smoking status:  Former Smoker    Types: Cigarettes    Quit date: 12/23/1980  . Smokeless tobacco: Former Neurosurgeon    Types: Chew    Quit date: 08/23/1994  . Alcohol Use: No  . Drug Use: No  . Sexually Active: No   Other Topics Concern  . Not on file   Social History Narrative   Lives in [place] with Granddaughter.  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  She is not routinely exercising.    Review of Systems  General:  No chills, fever, night sweats or weight changes.  Cardiovascular:  +++ chest pain, dyspnea on exertion, edema, orthopnea, palpitations, paroxysmal nocturnal dyspnea. Dermatological: No rash, lesions/masses Respiratory: No cough, dyspnea Urologic: No hematuria, dysuria Abdominal:   +++ nausea, vomiting, diarrhea, bright red blood per rectum, melena, or hematemesis Neurologic:  No visual changes, wkns, changes in mental status. All other systems reviewed and are otherwise negative except as noted above.  Physical Exam  Blood pressure 130/60, pulse 66, temperature 97.4 F (36.3 C), temperature source Axillary, resp. rate 16, height 5' 2.5" (1.588 m), weight 158 lb 8.2 oz (71.9 kg), last menstrual period 11/24/1992, SpO2 98.00%.  General: Pleasant, NAD Psych: Normal affect. Neuro: Alert and oriented X 3. Moves all extremities spontaneously. HEENT: Normal  Neck: Supple with L carotid bruit.  No JVD. Lungs:  Resp regular and unlabored, CTA. Heart: RRR no s3, s4, or murmurs. Abdomen: Soft, non-tender, non-distended, BS + x 4.  Extremities: No clubbing, cyanosis or edema. DP/PT/Radials 1+ and equal bilaterally.  Labs  Recent Labs  03/06/13 0135 03/06/13 0846  TROPONINI <0.30 <0.30   Lab Results  Component Value Date   WBC 6.5 03/05/2013   HGB 10.8* 03/05/2013   HCT 32.7* 03/05/2013   MCV 79.8 03/05/2013   PLT 151 03/05/2013     Recent Labs Lab 03/05/13 1728  NA 140  K 4.1  CL 101  CO2 29  BUN 19  CREATININE 0.77  CALCIUM 10.1  GLUCOSE 213*   Lab Results    Component Value Date   CHOL 121 03/06/2013   HDL 44 03/06/2013   LDLCALC 62 03/06/2013   TRIG 75 03/06/2013   Radiology/Studies  Dg Chest 2 View  03/05/2013  *RADIOLOGY REPORT*  Clinical Data: Chest pain  CHEST - 2 VIEW  Comparison: 05/14/2012 and prior chest radiographs  Findings: The cardiomediastinal silhouette is unremarkable. The lungs are  clear. There is no evidence of focal airspace disease, pulmonary edema, suspicious pulmonary nodule/mass, pleural effusion, or pneumothorax. No acute bony abnormalities are identified. Surgical clips in the lower right neck and right upper abdomen again noted.  IMPRESSION: No evidence of acute cardiopulmonary disease.   Original Report Authenticated By: Harmon Pier, M.D.    ECG  Rsr, 75, inferior and antlat st flattening/depression with twi - more pronounced in V5-V6 then in Sep 08, 2012 and 11/2011 ecg's.  ASSESSMENT AND PLAN  1.  Right Arm and Chest Pain/CAD:  Pt presented with a > 24hr h/o right sided arm and chest pain associated with nausea yesterday.  Symptoms were not worsened with activity, palpation, position changes, or deep breathing.  Ss resolved after morphine last night.  Despite prolonged Ss, cardiac markers are negative.  ECG, which has been abnl in the past, has lateral changes that are more pronounced on this admission than on most recent prior ecg's (though they have been present to some degree on even older ecg's).  She is currently pain free.  Her prior anginal equivalent is midsternal chest tightness, which she has not had here.  We will arrange for outpatient lexiscan myoview to r/o ischemia early next week along with follow-up with Dr. Eden Emms.  She may be discharged today.  Cont asa, bb, statin, nitrate.  2.  Left Carotid Bruit:  Don't see where this has been evaluated before.  Will arrange for outpt carotid u/s in our office.  3.  HTN:  Stable.  4.  HL:  Cont home dose of pravastatin.  LDL 62.  5.  DM:  A1c 12.4 in January - per  IM.  Signed, Nicolasa Ducking, NP 03/06/2013, 1:14 PM  Pt has atypical chest pain.  She has known CAD so her pretest likelihood is at least intermediate.  She should be stratified with myoview scanning, and given the option of outpt vs inpatient, she has chosen the latter with which I would concur  Will set up for am  outpt eval of carotid briuit is appropriate

## 2013-03-06 NOTE — Progress Notes (Signed)
Patient seen and examined by me today, please see H&P for more details.  Paula Compton, MD

## 2013-03-06 NOTE — Progress Notes (Signed)
PGY-1 Daily Progress Note Family Medicine Teaching Service Michele R. Siah Steely, DO Service Pager: [phone]   Subjective: Pt feeling much better this AM.  States this pain was similar to pain she had with previously shoulder strains but much worse.   Objective:  VITALS Temp:  [97.4 F (36.3 C)-98.4 F (36.9 C)] 97.4 F (36.3 C) (03/15 0554) Pulse Rate:  [58-77] 66 (03/15 0554) Resp:  [14-22] 16 (03/15 0015) BP: (112-145)/(42-61) 120/61 mmHg (03/15 0554) SpO2:  [92 %-100 %] 98 % (03/15 0554) Weight:  [158 lb 8.2 oz (71.9 kg)] 158 lb 8.2 oz (71.9 kg) (03/15 0050)  In/Out No intake or output data in the 24 hours ending 03/06/13 0807  Physical Exam: Gen: NAD, resting comfortably in bed  HEENT: NCAT, MMM, PERRLA  CV: RRR no mrg. No JVD. No TTP Chest wall   Lungs: CTAB  Abd: soft/nontender/nondistended/normal bowel sounds  MSK: no TTP R shoulder, ROM WNL R shoulder without pain on AROM or PROM, Neer's/Hawkins negative 5/5 MS R shoulder without pain  Skin: warm and dry, no rash  Neuro: grossly normal, alert and oriented x3.    MEDS Scheduled Meds: . amLODipine  10 mg Oral Daily  . aspirin  81 mg Oral Daily  . carvedilol  12.5 mg Oral BID WC  . fluticasone  2 spray Each Nare Daily  . heparin  5,000 Units Subcutaneous Q8H  . insulin aspart  0-9 Units Subcutaneous TID WC  . insulin glargine  7 Units Subcutaneous q morning - 10a  . isosorbide mononitrate  60 mg Oral Daily  . levothyroxine  50 mcg Oral QAC breakfast  . lisinopril  40 mg Oral Daily  . simvastatin  20 mg Oral q1800   Continuous Infusions:  PRN Meds:.acetaminophen, nitroGLYCERIN, ondansetron (ZOFRAN) IV  Labs and imaging:   CBC  Recent Labs Lab 03/05/13 1728  WBC 6.5  HGB 10.8*  HCT 32.7*  PLT 151   BMET/CMET  Recent Labs Lab 03/05/13 1728  NA 140  K 4.1  CL 101  CO2 29  BUN 19  CREATININE 0.77  CALCIUM 10.1  GLUCOSE 213*   Results for orders placed during the hospital encounter of 03/05/13  (from the past 24 hour(s))  CBC     Status: Abnormal   Collection Time    03/05/13  5:28 PM      Result Value Range   WBC 6.5  4.0 - 10.5 K/uL   RBC 4.10  3.87 - 5.11 MIL/uL   Hemoglobin 10.8 (*) 12.0 - 15.0 g/dL   HCT 45.4 (*) 09.8 - 11.9 %   MCV 79.8  78.0 - 100.0 fL   MCH 26.3  26.0 - 34.0 pg   MCHC 33.0  30.0 - 36.0 g/dL   RDW 14.7  82.9 - 56.2 %   Platelets 151  150 - 400 K/uL  BASIC METABOLIC PANEL     Status: Abnormal   Collection Time    03/05/13  5:28 PM      Result Value Range   Sodium 140  135 - 145 mEq/L   Potassium 4.1  3.5 - 5.1 mEq/L   Chloride 101  96 - 112 mEq/L   CO2 29  19 - 32 mEq/L   Glucose, Bld 213 (*) 70 - 99 mg/dL   BUN 19  6 - 23 mg/dL   Creatinine, Ser 1.30  0.50 - 1.10 mg/dL   Calcium 86.5  8.4 - 78.4 mg/dL   GFR calc non Af Denyse Dago  83 (*) >90 mL/min   GFR calc Af Amer >90  >90 mL/min  POCT I-STAT TROPONIN I     Status: None   Collection Time    03/05/13  5:42 PM      Result Value Range   Troponin i, poc 0.00  0.00 - 0.08 ng/mL   Comment 3           GLUCOSE, CAPILLARY     Status: Abnormal   Collection Time    03/06/13 12:57 AM      Result Value Range   Glucose-Capillary 147 (*) 70 - 99 mg/dL  LIPID PANEL     Status: None   Collection Time    03/06/13  1:34 AM      Result Value Range   Cholesterol 121  0 - 200 mg/dL   Triglycerides 75  <409 mg/dL   HDL 44  >81 mg/dL   Total CHOL/HDL Ratio 2.8     VLDL 15  0 - 40 mg/dL   LDL Cholesterol 62  0 - 99 mg/dL  TROPONIN I     Status: None   Collection Time    03/06/13  1:35 AM      Result Value Range   Troponin I <0.30  <0.30 ng/mL  PRO B NATRIURETIC PEPTIDE     Status: None   Collection Time    03/06/13  1:35 AM      Result Value Range   Pro B Natriuretic peptide (BNP) 13.2  0 - 125 pg/mL  GLUCOSE, CAPILLARY     Status: Abnormal   Collection Time    03/06/13  7:33 AM      Result Value Range   Glucose-Capillary 161 (*) 70 - 99 mg/dL   Comment 1 Notify RN     Dg Chest 2  View  03/05/2013  *RADIOLOGY REPORT*  Clinical Data: Chest pain  CHEST - 2 VIEW  Comparison: 05/14/2012 and prior chest radiographs  Findings: The cardiomediastinal silhouette is unremarkable. The lungs are clear. There is no evidence of focal airspace disease, pulmonary edema, suspicious pulmonary nodule/mass, pleural effusion, or pneumothorax. No acute bony abnormalities are identified. Surgical clips in the lower right neck and right upper abdomen again noted.  IMPRESSION: No evidence of acute cardiopulmonary disease.   Original Report Authenticated By: Harmon Pier, M.D.      Assessment/Plan [name] is a 72 y.o. year old female presenting with R shoulder pain that moved to her right/middle chest.  1. Non Typical CP secondary to most likely MSK   1. Troponin negative x 2, one more pending 2. EKG pending from this AM.  EKG from yesterday unchanged from previous in December 2013.  No events on telemetry overnight 3. Will talk to cardiology [facility]) about seeing the patient today 4. TSH pending, A1C 12.0 in January 5. Continue ASA, BB 2. DM II, out of control  1. SSI and Lantus 1/2 home dose.  Stable since admission 3. HLD - continue statin 4. HTN - Continue home medications 5. Hypothyroidsim - Continue home Synthroid 1. TSH pending 6. Microcytic anemia  1. Hgb 10.8 on admission (baseline 11) 2. Monitor CBC 7. FEN/GI: Carb Modified  Prophylaxis:  Heparin SQ  Disposition: Pending cardiology input, d/c later today  Code Status: Full   Jerardo Costabile R. Paulina Fusi, DO of [facility] 03/06/2013, 8:07 AM

## 2013-03-07 ENCOUNTER — Inpatient Hospital Stay (HOSPITAL_COMMUNITY): Payer: Medicare PPO

## 2013-03-07 DIAGNOSIS — R079 Chest pain, unspecified: Secondary | ICD-10-CM

## 2013-03-07 MED ORDER — TECHNETIUM TC 99M SESTAMIBI - CARDIOLITE
30.0000 | Freq: Once | INTRAVENOUS | Status: AC | PRN
  Administered 2013-03-07: 30 via INTRAVENOUS

## 2013-03-07 MED ORDER — TECHNETIUM TC 99M SESTAMIBI - CARDIOLITE
10.0000 | Freq: Once | INTRAVENOUS | Status: AC | PRN
  Administered 2013-03-07: 08:00:00 10 via INTRAVENOUS

## 2013-03-07 MED ORDER — REGADENOSON 0.4 MG/5ML IV SOLN
0.4000 mg | Freq: Once | INTRAVENOUS | Status: AC
  Administered 2013-03-07: 0.4 mg via INTRAVENOUS
  Filled 2013-03-07: qty 5

## 2013-03-07 NOTE — Progress Notes (Signed)
PGY-1 Daily Progress Note Family Medicine Teaching Service Service Pager: [phone]   Subjective: patient doing well following myoview. Denies chest pain.  Objective:  VITALS Temp:  [97.8 F (36.6 C)-99.2 F (37.3 C)] 97.8 F (36.6 C) (03/16 0500) Pulse Rate:  [60-106] 68 (03/16 0922) Resp:  [17-18] 18 (03/16 0500) BP: (114-150)/(45-73) 150/61 mmHg (03/16 0922) SpO2:  [95 %-99 %] 99 % (03/16 0500) Weight:  [156 lb 1.4 oz (70.8 kg)] 156 lb 1.4 oz (70.8 kg) (03/16 0500)  In/Out  Intake/Output Summary (Last 24 hours) at 03/07/13 1001 Last data filed at 03/07/13 0430  Gross per 24 hour  Intake    340 ml  Output    351 ml  Net    -11 ml    Physical Exam: Gen: NAD, resting comfortably in bed  CV: RRR no mrg.    Lungs: CTAB  Ext: no edema Skin: warm and dry, no rash  Neuro: grossly normal, alert and oriented x3.    MEDS Scheduled Meds: . amLODipine  10 mg Oral Daily  . aspirin  81 mg Oral Daily  . carvedilol  12.5 mg Oral BID WC  . fluticasone  2 spray Each Nare Daily  . heparin  5,000 Units Subcutaneous Q8H  . insulin aspart  0-9 Units Subcutaneous TID WC  . insulin glargine  7 Units Subcutaneous q morning - 10a  . isosorbide mononitrate  60 mg Oral Daily  . levothyroxine  50 mcg Oral QAC breakfast  . lisinopril  40 mg Oral Daily  . simvastatin  20 mg Oral q1800   Continuous Infusions:  PRN Meds:.acetaminophen, nitroGLYCERIN, ondansetron (ZOFRAN) IV  Labs and imaging:   CBC  Recent Labs Lab 03/05/13 1728  WBC 6.5  HGB 10.8*  HCT 32.7*  PLT 151   BMET/CMET  Recent Labs Lab 03/05/13 1728  NA 140  K 4.1  CL 101  CO2 29  BUN 19  CREATININE 0.77  CALCIUM 10.1  GLUCOSE 213*   Results for orders placed during the hospital encounter of 03/05/13 (from the past 24 hour(s))  GLUCOSE, CAPILLARY     Status: Abnormal   Collection Time    03/06/13 11:17 AM      Result Value Range   Glucose-Capillary 152 (*) 70 - 99 mg/dL  GLUCOSE, CAPILLARY     Status:  Abnormal   Collection Time    03/06/13  4:34 PM      Result Value Range   Glucose-Capillary 186 (*) 70 - 99 mg/dL  GLUCOSE, CAPILLARY     Status: Abnormal   Collection Time    03/07/13  7:58 AM      Result Value Range   Glucose-Capillary 192 (*) 70 - 99 mg/dL   Dg Chest 2 View  4/54/0981  *RADIOLOGY REPORT*  Clinical Data: Chest pain  CHEST - 2 VIEW  Comparison: 05/14/2012 and prior chest radiographs  Findings: The cardiomediastinal silhouette is unremarkable. The lungs are clear. There is no evidence of focal airspace disease, pulmonary edema, suspicious pulmonary nodule/mass, pleural effusion, or pneumothorax. No acute bony abnormalities are identified. Surgical clips in the lower right neck and right upper abdomen again noted.  IMPRESSION: No evidence of acute cardiopulmonary disease.   Original Report Authenticated By: Harmon Pier, M.D.      Assessment/Plan [name] is a 72 y.o. year old female presenting with R shoulder pain that moved to her right/middle chest.  1. Non Typical CP secondary to most likely MSK   -Troponin negative  x 2 -EKG from admission unchanged from previous in December 2013.  No events on telemetry overnight -Cardiology consulted and myoview negative for ischemic changes, ok for discharge -TSH low normal, A1C 12.0 in January -Continue ASA, BB  2. DM II, out of control  -SSI and Lantus 1/2 home dose.  Stable since admission  3. HLD - continue statin  4. HTN - Continue home medications  5. Hypothyroidsim - Continue home Synthroid -TSH low normal  6 Microcytic anemia  -Hgb 10.8 on admission (baseline 11), now 11.2 -Monitor CBC  FEN/GI: Carb Modified  Prophylaxis:  Heparin SQ  Disposition: discharge Code Status: Full   Marikay Alar, MD of [facility] 03/07/2013, 3:00 PM

## 2013-03-07 NOTE — Progress Notes (Signed)
Patient Name: [name] Date of Encounter: 03/07/2013   Principal Problem:   Chest pain Active Problems:   HYPOTHYROIDISM, UNSPECIFIED   Diabetes mellitus out of control   HYPERCHOLESTEROLEMIA   ANEMIA, OTHER, UNSPECIFIED   HYPERTENSION, BENIGN SYSTEMIC   CORONARY, ARTERIOSCLEROSIS   SUBJECTIVE  No c/p, sob.  For cardiolite today.  CURRENT MEDS . amLODipine  10 mg Oral Daily  . aspirin  81 mg Oral Daily  . carvedilol  12.5 mg Oral BID WC  . fluticasone  2 spray Each Nare Daily  . heparin  5,000 Units Subcutaneous Q8H  . insulin aspart  0-9 Units Subcutaneous TID WC  . insulin glargine  7 Units Subcutaneous q morning - 10a  . isosorbide mononitrate  60 mg Oral Daily  . levothyroxine  50 mcg Oral QAC breakfast  . lisinopril  40 mg Oral Daily  . simvastatin  20 mg Oral q1800    OBJECTIVE  Filed Vitals:   03/06/13 1342 03/06/13 2100 03/07/13 0500 03/07/13 0922  BP: 120/45 114/52 129/73 150/61  Pulse: 106 60 60 68  Temp: 99.2 F (37.3 C) 98.6 F (37 C) 97.8 F (36.6 C)   TempSrc: Oral Oral Oral   Resp: 17 18 18    Height:      Weight:   156 lb 1.4 oz (70.8 kg)   SpO2: 95% 96% 99%     Intake/Output Summary (Last 24 hours) at 03/07/13 0956 Last data filed at 03/07/13 0430  Gross per 24 hour  Intake    340 ml  Output    351 ml  Net    -11 ml   Filed Weights   03/06/13 0050 03/07/13 0500  Weight: 158 lb 8.2 oz (71.9 kg) 156 lb 1.4 oz (70.8 kg)    PHYSICAL EXAM  General: Pleasant, NAD. Neuro: Alert and oriented X 3. Moves all extremities spontaneously. Psych: Normal affect. HEENT:  Normal  Neck: Supple with L bruit.  No jvd. Lungs:  Resp regular and unlabored, CTA. Heart: RRR no s3, s4, or murmurs. Abdomen: Soft, non-tender, non-distended, BS + x 4.  Extremities: No clubbing, cyanosis or edema. DP/PT/Radials 1+ and equal bilaterally.  Accessory Clinical Findings  CBC  Recent Labs  03/05/13 1728  WBC 6.5  HGB 10.8*  HCT 32.7*  MCV 79.8   PLT 151   Basic Metabolic Panel  Recent Labs  03/05/13 1728  NA 140  K 4.1  CL 101  CO2 29  GLUCOSE 213*  BUN 19  CREATININE 0.77  CALCIUM 10.1   Cardiac Enzymes  Recent Labs  03/06/13 0135 03/06/13 0846  TROPONINI <0.30 <0.30   Fasting Lipid Panel  Recent Labs  03/06/13 0134  CHOL 121  HDL 44  LDLCALC 62  TRIG 75  CHOLHDL 2.8   Thyroid Function Tests  Recent Labs  03/06/13 0135  TSH 0.377    TELE  Seen in nuc med - in sinus.  Radiology/Studies  Dg Chest 2 View  03/05/2013  *RADIOLOGY REPORT*  Clinical Data: Chest pain  CHEST - 2 VIEW  Comparison: 05/14/2012 and prior chest radiographs  Findings: The cardiomediastinal silhouette is unremarkable. The lungs are clear. There is no evidence of focal airspace disease, pulmonary edema, suspicious pulmonary nodule/mass, pleural effusion, or pneumothorax. No acute bony abnormalities are identified. Surgical clips in the lower right neck and right upper abdomen again noted.  IMPRESSION: No evidence of acute cardiopulmonary disease.   Original Report Authenticated By: Harmon Pier, M.D.  ASSESSMENT AND PLAN  R Arm/chest pain/CAD:  No further pain.  CE negative.  For cardiolite today.  Cont asa, bb, statin, nitrate.  2.  L carotid bruit:  outpt u/s.  3. HTN:  Stable.  4. HL:  LDL 62 on pravastatin @ home.  5.  DM:  A1c 12.4 in 12/2012 - per IM.  Signed, Nicolasa Ducking NP  If myoview>>home today from our perspective

## 2013-03-07 NOTE — Progress Notes (Signed)
FMTS Attending Note Patient seen and examined by me this morning.  She states that she has no more chest discomfort or dyspnea.  Is awaiting Myoview study today.  Plan for Myoview study and further decisions about dispo pending the results of the study.  Paula Compton, MD

## 2013-03-07 NOTE — Discharge Summary (Signed)
Physician Discharge Summary  Patient ID: [name] MRN: [id number] DOB: 05-22-1941 Age: 72 y.o.  Admit date: 03/05/2013 Discharge date: 03/07/2013 Admitting Physician: Barbaraann Barthel, MD  PCP: Delbert Harness, MD  Consultants: Cardiology   Discharge Diagnosis: Principal Problem:   Chest pain Active Problems:   HYPOTHYROIDISM, UNSPECIFIED   Diabetes mellitus out of control   HYPERCHOLESTEROLEMIA   ANEMIA, OTHER, UNSPECIFIED   HYPERTENSION, BENIGN SYSTEMIC   CORONARY, ARTERIOSCLEROSIS  Hospital Course [name] is a 72 y.o. year old female presenting with R shoulder pain that moved to her right/middle chest.   1. Chest pain: presented with right shoulder pain followed by right/sternal chest pain without radiation and described as pressure. Troponins were cycled and negative and EKG revealed unchanged T-wave inversions. Patient was given morphine and had complete resolution of her pain. Cardiology was consulted and performed a myoview that was negative for ischemic changes. Risk stratification labs, TSH in low normal range and A1c 12.0 in January. Patient was continued on ASA and coreg.   2. DM II, out of control: A1c 12 in January. SSI and Lantus 1/2 home dose. CBGs stable throughout hospitalization.   3. HLD: continued simvastatin   4. HTN: Continued home coreg, norvasc, imdur, lisinopril   5. Hypothyroidsim: Continued home Synthroid. TSH 0.377  6 Microcytic anemia: Hgb 10.8 on admission (baseline 11), 11.2 at discharge  Problem List 1. Chest pain 2. DM 3. HLD 4. HTN 5. Hypothyroidism 6. Microcytic anemia         Discharge PE   Filed Vitals:   03/07/13 1400  BP: 130/72  Pulse: 66  Temp: 98.8 F (37.1 C)  Resp: 18   Gen: NAD, resting comfortably in bed  CV: RRR no mrg.  Lungs: CTAB  Ext: no edema  Skin: warm and dry, no rash  Neuro: grossly normal, alert and oriented x3.    Procedures/Imaging:  Dg Chest 2 View  03/05/2013 IMPRESSION: No  evidence of acute cardiopulmonary disease.   Original Report Authenticated By: Harmon Pier, M.D.    Nm Myocar Multi W/spect W/wall Motion / Ef  03/07/2013 IMPRESSION:  1.  No evidence of pharmacologic induced myocardial ischemia. Possible myocardial scar in the inferolateral wall. 2.  Mild inferior wall hypokinesis. 2.  Calculi left ventricular ejection fraction of 72%.   Original Report Authenticated By: Myles Rosenthal, M.D.     Labs  CBC  Recent Labs Lab 03/05/13 1728  WBC 6.5  HGB 10.8*  HCT 32.7*  PLT 151   BMET  Recent Labs Lab 03/05/13 1728  NA 140  K 4.1  CL 101  CO2 29  BUN 19  CREATININE 0.77  CALCIUM 10.1  GLUCOSE 213*   Results for orders placed during the hospital encounter of 03/05/13 (from the past 72 hour(s))  CBC     Status: Abnormal   Collection Time    03/05/13  5:28 PM      Result Value Range   WBC 6.5  4.0 - 10.5 K/uL   RBC 4.10  3.87 - 5.11 MIL/uL   Hemoglobin 10.8 (*) 12.0 - 15.0 g/dL   HCT 14.7 (*) 82.9 - 56.2 %   MCV 79.8  78.0 - 100.0 fL   MCH 26.3  26.0 - 34.0 pg   MCHC 33.0  30.0 - 36.0 g/dL   RDW 13.0  86.5 - 78.4 %   Platelets 151  150 - 400 K/uL  BASIC METABOLIC PANEL     Status: Abnormal  Collection Time    03/05/13  5:28 PM      Result Value Range   Sodium 140  135 - 145 mEq/L   Potassium 4.1  3.5 - 5.1 mEq/L   Chloride 101  96 - 112 mEq/L   CO2 29  19 - 32 mEq/L   Glucose, Bld 213 (*) 70 - 99 mg/dL   BUN 19  6 - 23 mg/dL   Creatinine, Ser 1.61  0.50 - 1.10 mg/dL   Calcium 09.6  8.4 - 04.5 mg/dL   GFR calc non Af Amer 83 (*) >90 mL/min   GFR calc Af Amer >90  >90 mL/min   Comment:            The eGFR has been calculated     using the CKD EPI equation.     This calculation has not been     validated in all clinical     situations.     eGFR's persistently     <90 mL/min signify     possible Chronic Kidney Disease.  POCT I-STAT TROPONIN I     Status: None   Collection Time    03/05/13  5:42 PM      Result Value Range    Troponin i, poc 0.00  0.00 - 0.08 ng/mL   Comment 3            Comment: Due to the release kinetics of cTnI,     a negative result within the first hours     of the onset of symptoms does not rule out     myocardial infarction with certainty.     If myocardial infarction is still suspected,     repeat the test at appropriate intervals.  GLUCOSE, CAPILLARY     Status: Abnormal   Collection Time    03/06/13 12:57 AM      Result Value Range   Glucose-Capillary 147 (*) 70 - 99 mg/dL  LIPID PANEL     Status: None   Collection Time    03/06/13  1:34 AM      Result Value Range   Cholesterol 121  0 - 200 mg/dL   Triglycerides 75  <409 mg/dL   HDL 44  >81 mg/dL   Total CHOL/HDL Ratio 2.8     VLDL 15  0 - 40 mg/dL   LDL Cholesterol 62  0 - 99 mg/dL   Comment:            Total Cholesterol/HDL:CHD Risk     Coronary Heart Disease Risk Table                         Men   Women      1/2 Average Risk   3.4   3.3      Average Risk       5.0   4.4      2 X Average Risk   9.6   7.1      3 X Average Risk  23.4   11.0                Use the calculated Patient Ratio     above and the CHD Risk Table     to determine the patient's CHD Risk.                ATP III CLASSIFICATION (LDL):      <100     mg/dL  Optimal      100-129  mg/dL   Near or Above                        Optimal      130-159  mg/dL   Borderline      409-811  mg/dL   High      >914     mg/dL   Very High  TROPONIN I     Status: None   Collection Time    03/06/13  1:35 AM      Result Value Range   Troponin I <0.30  <0.30 ng/mL   Comment:            Due to the release kinetics of cTnI,     a negative result within the first hours     of the onset of symptoms does not rule out     myocardial infarction with certainty.     If myocardial infarction is still suspected,     repeat the test at appropriate intervals.  TSH     Status: None   Collection Time    03/06/13  1:35 AM      Result Value Range   TSH 0.377  0.350 -  4.500 uIU/mL  PRO B NATRIURETIC PEPTIDE     Status: None   Collection Time    03/06/13  1:35 AM      Result Value Range   Pro B Natriuretic peptide (BNP) 13.2  0 - 125 pg/mL  GLUCOSE, CAPILLARY     Status: Abnormal   Collection Time    03/06/13  7:33 AM      Result Value Range   Glucose-Capillary 161 (*) 70 - 99 mg/dL   Comment 1 Notify RN    TROPONIN I     Status: None   Collection Time    03/06/13  8:46 AM      Result Value Range   Troponin I <0.30  <0.30 ng/mL   Comment:            Due to the release kinetics of cTnI,     a negative result within the first hours     of the onset of symptoms does not rule out     myocardial infarction with certainty.     If myocardial infarction is still suspected,     repeat the test at appropriate intervals.  GLUCOSE, CAPILLARY     Status: Abnormal   Collection Time    03/06/13 11:17 AM      Result Value Range   Glucose-Capillary 152 (*) 70 - 99 mg/dL  GLUCOSE, CAPILLARY     Status: Abnormal   Collection Time    03/06/13  4:34 PM      Result Value Range   Glucose-Capillary 186 (*) 70 - 99 mg/dL  GLUCOSE, CAPILLARY     Status: Abnormal   Collection Time    03/07/13  7:58 AM      Result Value Range   Glucose-Capillary 192 (*) 70 - 99 mg/dL  GLUCOSE, CAPILLARY     Status: Abnormal   Collection Time    03/07/13 12:10 PM      Result Value Range   Glucose-Capillary 249 (*) 70 - 99 mg/dL       Patient condition at time of discharge/disposition: stable  Disposition-home   Follow up issues: 1. Optimization of DM care with most recent A1c of 12  Discharge follow up:  Follow-up Information   Follow up with Delbert Harness, MD. (please call the office to schedule a hospital follow-up appointment as soon as possible)    Contact information:   [address] [place] [address] [phone]       Discharge Instructions: Please refer to Patient Instructions section of EMR for full details.  Patient was counseled important  signs and symptoms that should prompt return to medical care, changes in medications, dietary instructions, activity restrictions, and follow up appointments.    Discharge Medications   Medication List    TAKE these medications       amLODipine 10 MG tablet  Commonly known as:  NORVASC  Take 1 tablet (10 mg total) by mouth daily.     aspirin 81 MG chewable tablet  Chew 81 mg by mouth daily.     carvedilol 25 MG tablet  Commonly known as:  COREG  TAKE ONE TABLET BY MOUTH TWICE DAILY     fluticasone 50 MCG/ACT nasal spray  Commonly known as:  FLONASE  Place 2 sprays into the nose daily as needed. For allergies     hydrochlorothiazide 25 MG tablet  Commonly known as:  HYDRODIURIL  TAKE ONE TABLET BY MOUTH EVERY DAY     insulin glargine 100 UNIT/ML injection  Commonly known as:  LANTUS  Inject 14 Units into the skin every morning.     INSULIN SYRINGE 1CC/31GX5/16" 31G X 5/16" 1 ML Misc  1 application by Does not apply route 1 day or 1 dose.     isosorbide mononitrate 60 MG 24 hr tablet  Commonly known as:  IMDUR  Take 60 mg by mouth daily.     levothyroxine 50 MCG tablet  Commonly known as:  SYNTHROID, LEVOTHROID  Take 50 mcg by mouth daily.     lisinopril 20 MG tablet  Commonly known as:  PRINIVIL,ZESTRIL  Take 40 mg by mouth daily.     loratadine-pseudoephedrine 10-240 MG per 24 hr tablet  Commonly known as:  CLARITIN-D 24-hour  Take 1 tablet by mouth daily as needed. For allergies     metFORMIN 500 MG 24 hr tablet  Commonly known as:  GLUCOPHAGE-XR  Take 4 tablets (2,000 mg total) by mouth daily with breakfast.     metFORMIN 1000 MG tablet  Commonly known as:  GLUCOPHAGE  Take 1,000 mg by mouth 2 (two) times daily with a meal.     nitroGLYCERIN 0.4 MG SL tablet  Commonly known as:  NITROSTAT  Place 1 tablet (0.4 mg total) under the tongue every 5 (five) minutes as needed for chest pain.     ONE TOUCH ULTRA MINI W/DEVICE Kit  AS DIRECTED     onetouch  ultrasoft lancets  Check blood sugars tid with meals and before bed     pravastatin 40 MG tablet  Commonly known as:  PRAVACHOL  Take 1 tablet (40 mg total) by mouth daily.       Marikay Alar, MD of [facility] 03/07/2013 8:20 PM

## 2013-03-10 ENCOUNTER — Encounter: Payer: Self-pay | Admitting: *Deleted

## 2013-03-12 ENCOUNTER — Ambulatory Visit (INDEPENDENT_AMBULATORY_CARE_PROVIDER_SITE_OTHER): Payer: Medicare PPO | Admitting: Family Medicine

## 2013-03-12 ENCOUNTER — Encounter: Payer: Self-pay | Admitting: Family Medicine

## 2013-03-12 VITALS — BP 129/65 | HR 86 | Temp 98.3°F | Ht 62.5 in | Wt 158.9 lb

## 2013-03-12 DIAGNOSIS — I10 Essential (primary) hypertension: Secondary | ICD-10-CM

## 2013-03-12 DIAGNOSIS — E1165 Type 2 diabetes mellitus with hyperglycemia: Secondary | ICD-10-CM

## 2013-03-12 DIAGNOSIS — R079 Chest pain, unspecified: Secondary | ICD-10-CM

## 2013-03-12 MED ORDER — PRAVASTATIN SODIUM 40 MG PO TABS: 40.0000 mg | ORAL_TABLET | Freq: Every day | ORAL | Status: DC

## 2013-03-12 MED ORDER — INSULIN GLARGINE 100 UNIT/ML ~~LOC~~ SOLN: 16.0000 [IU] | Freq: Every morning | SUBCUTANEOUS | Status: DC

## 2013-03-12 MED ORDER — FLUTICASONE PROPIONATE 50 MCG/ACT NA SUSP: 2.0000 | Freq: Every day | NASAL | Status: DC | PRN

## 2013-03-12 MED ORDER — LEVOTHYROXINE SODIUM 50 MCG PO TABS: 50.0000 ug | ORAL_TABLET | Freq: Every day | ORAL | Status: DC

## 2013-03-12 MED ORDER — CARVEDILOL 25 MG PO TABS: 25.0000 mg | ORAL_TABLET | Freq: Two times a day (BID) | ORAL | Status: DC

## 2013-03-12 MED ORDER — GLUCOSE BLOOD VI STRP: ORAL_STRIP | Status: DC

## 2013-03-12 MED ORDER — LISINOPRIL 20 MG PO TABS: 40.0000 mg | ORAL_TABLET | Freq: Every day | ORAL | Status: DC

## 2013-03-12 MED ORDER — HYDROCHLOROTHIAZIDE 25 MG PO TABS: ORAL_TABLET | ORAL | Status: DC

## 2013-03-12 MED ORDER — AMLODIPINE BESYLATE 10 MG PO TABS: 10.0000 mg | ORAL_TABLET | Freq: Every day | ORAL | Status: DC

## 2013-03-12 MED ORDER — ONETOUCH ULTRASOFT LANCETS MISC: Status: DC

## 2013-03-12 NOTE — Assessment & Plan Note (Signed)
Well controlled 

## 2013-03-12 NOTE — Progress Notes (Signed)
  Subjective:    Patient ID: [name], female    DOB: 13-May-1941, 72 y.o.   MRN: [id number]  HPI  Here for hospital follow-up  Chest pain:  Was seen for chest pain, had normal myoview.  Has cardiology follow-up scheduled.  No furtehr chest pain since discharge  Diabetes:  Taking lantus 14 units qam.  Notes marked improvement in cbg's since adhering to new diet with her daughter who is a Engineer, civil (consulting).  Brings in glucose log- fasting is in  113-224 with average about 150-160.    HYPERTENSION  BP Readings from Last 3 Encounters:  03/12/13 129/65  03/07/13 130/72  02/01/13 117/69    Hypertension ROS: taking medications as instructed, no medication side effects noted, home BP monitoring in range of 130's systolic over 60's diastolic, no chest pain on exertion, no dyspnea on exertion and no swelling of ankles.      Review of Systems    see HPI Objective:   Physical Exam GEN: Alert & Oriented, No acute distress CV:  Regular Rate & Rhythm, no murmur Respiratory:  Normal work of breathing, CTAB Abd:  + BS, soft, no tenderness to palpation Ext: no pre-tibial edema         Assessment & Plan:

## 2013-03-12 NOTE — Assessment & Plan Note (Signed)
Resolved.  Follow-up with cardiology as recommended

## 2013-03-12 NOTE — Patient Instructions (Addendum)
You are doing great  Increase Lantus to 16 units  If you notice fasting sugars 70 or less, let me know  Follow-up in one month

## 2013-03-12 NOTE — Assessment & Plan Note (Signed)
Will increase lantus from 14-16. Follow-up in 1 month.  Expect a1c to be markedly improved give new compliance and self report of better glucose logs

## 2013-03-23 ENCOUNTER — Encounter (INDEPENDENT_AMBULATORY_CARE_PROVIDER_SITE_OTHER): Payer: Medicare PPO

## 2013-03-23 DIAGNOSIS — R0989 Other specified symptoms and signs involving the circulatory and respiratory systems: Secondary | ICD-10-CM

## 2013-03-23 DIAGNOSIS — I6529 Occlusion and stenosis of unspecified carotid artery: Secondary | ICD-10-CM

## 2013-04-01 ENCOUNTER — Ambulatory Visit (INDEPENDENT_AMBULATORY_CARE_PROVIDER_SITE_OTHER): Payer: Medicare PPO | Admitting: Cardiovascular Disease

## 2013-04-01 VITALS — BP 160/70 | HR 80 | Wt 158.0 lb

## 2013-04-01 DIAGNOSIS — I2510 Atherosclerotic heart disease of native coronary artery without angina pectoris: Secondary | ICD-10-CM

## 2013-04-01 DIAGNOSIS — E039 Hypothyroidism, unspecified: Secondary | ICD-10-CM

## 2013-04-01 DIAGNOSIS — E1165 Type 2 diabetes mellitus with hyperglycemia: Secondary | ICD-10-CM

## 2013-04-01 DIAGNOSIS — E7800 Pure hypercholesterolemia, unspecified: Secondary | ICD-10-CM

## 2013-04-01 DIAGNOSIS — I10 Essential (primary) hypertension: Secondary | ICD-10-CM

## 2013-04-01 MED ORDER — NITROGLYCERIN 0.4 MG SL SUBL: 0.4000 mg | SUBLINGUAL_TABLET | SUBLINGUAL | Status: DC | PRN

## 2013-04-01 NOTE — Addendum Note (Signed)
Addended by: Scherrie Bateman E on: 04/01/2013 02:17 PM   Modules accepted: Orders

## 2013-04-01 NOTE — Assessment & Plan Note (Signed)
Cholesterol is at goal.  Continue current dose of statin and diet Rx.  No myalgias or side effects.  F/U  LFT's in 6 months. Lab Results  Component Value Date   LDLCALC 62 03/06/2013

## 2013-04-01 NOTE — Assessment & Plan Note (Signed)
Poorly controlled Discussed low carb diet  A1c in January was over 12  Needs closer f/u

## 2013-04-01 NOTE — Assessment & Plan Note (Signed)
Well controlled.  Continue current medications and low sodium Dash type diet.    

## 2013-04-01 NOTE — Progress Notes (Signed)
Patient ID: [name], female   DOB: 1941/09/11, 72 y.o.   MRN: [id number] [name] is seen today in followup for her hypertension hypercholesterolemia diabetes and coronary artery disease. 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 She had recurrent angina in March of this year and had a cutting balloon therapy of her diagonal branch by Dr. [name].  Hospitalized for SSCP recently with  R/o  Needs nitro No further pain since discharge  BS not well controlled cheats on diet  Nnormal myovue 03/06/13 reviewed Carotid Duplex 4/1 with 0-39% disease but abnormal thyroid appearance reviewed  Prevoius thyroidectomy She does not remember ENT   ROS: Denies fever, malais, weight loss, blurry vision, decreased visual acuity, cough, sputum, SOB, hemoptysis, pleuritic pain, palpitaitons, heartburn, abdominal pain, melena, lower extremity edema, claudication, or rash.  All other systems reviewed and negative  General: Affect appropriate Healthy:  appears stated age HEENT: normal Neck supple with no adenopathy JVP normal no bruits no thyromegaly Lungs clear with no wheezing and good diaphragmatic motion Heart:  S1/S2 no murmur, no rub, gallop or click PMI normal Abdomen: benighn, BS positve, no tenderness, no AAA no bruit.  No HSM or HJR Distal pulses intact with no bruits No edema Neuro non-focal Skin warm and dry No muscular weakness   Current Outpatient Prescriptions  Medication Sig Dispense Refill  . amLODipine (NORVASC) 10 MG tablet Take 1 tablet (10 mg total) by mouth daily.  90 tablet  3  . aspirin 81 MG chewable tablet Chew 81 mg by mouth daily.        . Blood Glucose Monitoring Suppl (ONE TOUCH ULTRA MINI) W/DEVICE KIT AS DIRECTED  1 each  0  . carvedilol (COREG) 25 MG tablet Take 1 tablet (25 mg total) by mouth 2 (two) times daily with a meal.  180 tablet  3  . fluticasone (FLONASE) 50 MCG/ACT nasal spray Place 2 sprays into the nose daily as needed. For allergies  16 g   6  . glucose blood (ONE TOUCH TEST STRIPS) test strip Use as instructed  100 each  12  . hydrochlorothiazide (HYDRODIURIL) 25 MG tablet TAKE ONE TABLET BY MOUTH EVERY DAY  90 tablet  3  . insulin glargine (LANTUS) 100 UNIT/ML injection Inject 0.16 mLs (16 Units total) into the skin every morning.  10 mL  11  . Insulin Syringe-Needle U-100 (INSULIN SYRINGE 1CC/31GX5/16") 31G X 5/16" 1 ML MISC 1 application by Does not apply route 1 day or 1 dose.  100 each  6  . isosorbide mononitrate (IMDUR) 60 MG 24 hr tablet Take 60 mg by mouth daily.      . Lancets (ONETOUCH ULTRASOFT) lancets Check blood sugars tid with meals and before bed  200 each  12  . levothyroxine (SYNTHROID, LEVOTHROID) 50 MCG tablet Take 1 tablet (50 mcg total) by mouth daily.  90 tablet  3  . lisinopril (PRINIVIL,ZESTRIL) 20 MG tablet Take 2 tablets (40 mg total) by mouth daily.  180 tablet  3  . loratadine-pseudoephedrine (CLARITIN-D 24-HOUR) 10-240 MG per 24 hr tablet Take 1 tablet by mouth daily as needed. For allergies      . metFORMIN (GLUCOPHAGE-XR) 500 MG 24 hr tablet Take 4 tablets (2,000 mg total) by mouth daily with breakfast.      . nitroGLYCERIN (NITROSTAT) 0.4 MG SL tablet Place 1 tablet (0.4 mg total) under the tongue every 5 (five) minutes as needed for chest pain.  90 tablet  3  .  pravastatin (PRAVACHOL) 40 MG tablet Take 1 tablet (40 mg total) by mouth daily.  90 tablet  3   Current Facility-Administered Medications  Medication Dose Route Frequency Provider Last Rate Last Dose  . nitroGLYCERIN (NITROSTAT) SL tablet 0.4 mg  0.4 mg Sublingual Once Dayarmys Piloto de Criselda Peaches, MD        Allergies  Codeine and Sulfamethoxazole w-trimethoprim  Electrocardiogram: 03/08/13 SR rate 75 inferolateral T wave changes chronic  Assessment and Plan

## 2013-04-01 NOTE — Assessment & Plan Note (Signed)
Stable with no angina and good activity level.  Continue medical Rx Nitro called in Myovue 3/14 normal. 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

## 2013-04-01 NOTE — Assessment & Plan Note (Signed)
Abnormal thyroid appearance on [place] needs f/u with [facility] for dedicated imaging

## 2013-04-01 NOTE — Patient Instructions (Signed)
Your physician wants you to follow-up in: YEAR WITH DR NISHAN  You will receive a reminder letter in the mail two months in advance. If you don't receive a letter, please call our office to schedule the follow-up appointment.  Your physician recommends that you continue on your current medications as directed. Please refer to the Current Medication list given to you today. 

## 2013-04-12 ENCOUNTER — Ambulatory Visit (INDEPENDENT_AMBULATORY_CARE_PROVIDER_SITE_OTHER): Payer: Medicare PPO | Admitting: Family Medicine

## 2013-04-12 VITALS — BP 122/48 | HR 78 | Temp 97.8°F | Ht 62.5 in | Wt 159.0 lb

## 2013-04-12 DIAGNOSIS — I10 Essential (primary) hypertension: Secondary | ICD-10-CM

## 2013-04-12 DIAGNOSIS — E049 Nontoxic goiter, unspecified: Secondary | ICD-10-CM

## 2013-04-12 DIAGNOSIS — E119 Type 2 diabetes mellitus without complications: Secondary | ICD-10-CM

## 2013-04-12 DIAGNOSIS — Z85850 Personal history of malignant neoplasm of thyroid: Secondary | ICD-10-CM | POA: Insufficient documentation

## 2013-04-12 DIAGNOSIS — E039 Hypothyroidism, unspecified: Secondary | ICD-10-CM

## 2013-04-12 DIAGNOSIS — E1165 Type 2 diabetes mellitus with hyperglycemia: Secondary | ICD-10-CM

## 2013-04-12 LAB — T4, FREE: Free T4: 1.19 ng/dL (ref 0.80–1.80)

## 2013-04-12 NOTE — Progress Notes (Signed)
  Subjective:    Patient ID: [name], female    DOB: Nov 18, 1941, 72 y.o.   MRN: [id number]  HPI 72 yo here for follow-up of incidental thyroid finding on carotid ultrasound by cardiology.  Also for diabetes  Incidental left thyroid enlargement:  Patient states has been like that since she had thyroid surgery.  States it was for cancer and has been taking thyroid hormone replacement since that time.   She does not know when it was done and cannot give me an estimate of date.  Records reveal it was pre-existing at least 2001 when it was noted in records.  No path or op reports found.  Patient reports no increase mass, no dysphagia, no pain.  DIABETES  Taking and tolerating: yes- lantus 14 she reports.  Was supopsed to be 16 Fasting blood sugars: 130's, did not bring meter Hypoglycemic symptoms: no Visual problems: no Monitoring feet: yes Numbness/Tingling: no Diabetic Labs:  Lab Results  Component Value Date   HGBA1C 12.4 01/07/2013   HGBA1C 10.0* 09/14/2012   HGBA1C 8.6* 11/25/2011   Lab Results  Component Value Date   LDLCALC 62 03/06/2013   CREATININE 0.77 03/05/2013   Last microalbumin: No results found for this basename: MICROALBUR, [id number]   HYPERTENSION  BP Readings from Last 3 Encounters:  04/12/13 122/48  04/01/13 160/70  03/12/13 129/65    Hypertension ROS: taking medications as instructed, no medication side effects noted, no chest pain on exertion, no dyspnea on exertion and no swelling of ankles.     I have reviewed patient's  PMH, FH, and Social history and Medications as related to this visit. History of thyroid cancer  Review of Systems     Objective:   Physical Exam  GEN: Alert & Oriented, No acute distress Neck:  No lymphadenopathy. Left sided prominence of thyroid appreciated with no palpable nodules.  nontender CV:  Regular Rate & Rhythm, no murmur Respiratory:  Normal work of breathing, CTAB Abd:  + BS, soft, no tenderness to  palpation Ext: no pre-tibial edema       Assessment & Plan:

## 2013-04-12 NOTE — Assessment & Plan Note (Signed)
Well controlled 

## 2013-04-12 NOTE — Patient Instructions (Addendum)
Will schedule thyroid ultrasound to check on your thyroid  Your a1c is much better- 9.7% vs > 12%.  This is your three month average number  Increase Lantus to 16 units each day  Let me know if you have low blood sugar symptoms and blood sugars less than 75.  Follow-up in 3 months and bring your glucose log at that time.

## 2013-04-12 NOTE — Assessment & Plan Note (Signed)
Much improved at 9.7 from over 12%.  Will increase lantus from 14 to 16 units, encouraged her to continue diet which has really helped a great deal.  Will follow-up in 3 months.  Spoke with health coach in office today.

## 2013-04-12 NOTE — Assessment & Plan Note (Addendum)
Will check TSH, T4 and thyroid ultrasound today.  Will request records for history of thyroid cancer.    Update:  TSH/T4 wnl.  Ultrasound shows "single focal cystic area of nodularity 0.7cm" with benign appearance."    I suspect likely not clinically significant, but given history of thyroid cancer, will refer to endocrinology for opinion on if further evaluation or monitoring is indicated.

## 2013-04-14 ENCOUNTER — Ambulatory Visit (HOSPITAL_COMMUNITY)
Admission: RE | Admit: 2013-04-14 | Discharge: 2013-04-14 | Disposition: A | Discharge status: Home / Self Care | Payer: Medicare PPO | Source: Ambulatory Visit | Attending: Family Medicine | Admitting: Family Medicine

## 2013-04-14 ENCOUNTER — Telehealth: Payer: Self-pay | Admitting: Family Medicine

## 2013-04-14 ENCOUNTER — Encounter: Payer: Self-pay | Admitting: Family Medicine

## 2013-04-14 DIAGNOSIS — E0789 Other specified disorders of thyroid: Secondary | ICD-10-CM | POA: Insufficient documentation

## 2013-04-14 DIAGNOSIS — E049 Nontoxic goiter, unspecified: Secondary | ICD-10-CM | POA: Insufficient documentation

## 2013-04-14 DIAGNOSIS — Z85850 Personal history of malignant neoplasm of thyroid: Secondary | ICD-10-CM | POA: Insufficient documentation

## 2013-04-14 NOTE — Telephone Encounter (Signed)
Left message to discuss results of thyroid ultrasound.  Will plan to discuss that likely not clinically signficant, but given history of thyroid cancer and ? Cystic nodularity will refer to endocrinology for opinion on need for further evaluation or monitoring

## 2013-04-14 NOTE — Addendum Note (Signed)
Addended by: Macy Mis on: 04/14/2013 04:05 PM   Modules accepted: Orders

## 2013-04-15 ENCOUNTER — Telehealth: Payer: Self-pay | Admitting: Family Medicine

## 2013-04-15 NOTE — Telephone Encounter (Signed)
Will send to Dr. Earnest Bailey.

## 2013-04-15 NOTE — Telephone Encounter (Signed)
Patient is calling to find out the results of her ultrasound on her throat.

## 2013-04-15 NOTE — Telephone Encounter (Signed)
Discussed with patient.    [name] to place endocrinology referral.  Patient requests to give both work and home number as sometimes more available at work number

## 2013-04-20 ENCOUNTER — Telehealth: Payer: Self-pay | Admitting: Family Medicine

## 2013-04-20 NOTE — Telephone Encounter (Signed)
Referral was just put in last week.  Faxed to [facility] on 04/15/2013.  They will call Ms. Groom to schedule.  It could take a couple of weeks before she hears from them.  Ileana Ladd

## 2013-04-20 NOTE — Telephone Encounter (Signed)
Endocrinology referral placed several days ago.  Can you please follow-up to see when patient is scheduled?

## 2013-04-20 NOTE — Telephone Encounter (Signed)
Pt states that she has not heard about her biopsy and was told to call if she hasn't heard from them

## 2013-04-21 NOTE — Telephone Encounter (Signed)
Left message with Sunny Schlein (Daughter) that [facility] will call her to schedule but it might take a couple of weeks before she hears anything.  Ileana Ladd

## 2013-04-21 NOTE — Telephone Encounter (Signed)
Thanks for checking- would you please let [name] know this information?

## 2013-04-23 ENCOUNTER — Other Ambulatory Visit: Payer: Self-pay | Admitting: Family Medicine

## 2013-04-29 ENCOUNTER — Telehealth: Payer: Self-pay | Admitting: Family Medicine

## 2013-04-29 NOTE — Telephone Encounter (Signed)
Patient is calling to find out the status of her referral for a biopsy.  She hasn't heard anything.

## 2013-04-29 NOTE — Telephone Encounter (Signed)
Looked into referral and saw that pt has a balance with [facility].  She is aware now that she will need to call them and pay on her balance.  Deboraha Sprang has been trying to contact her about it before being seen.

## 2013-06-02 ENCOUNTER — Ambulatory Visit (INDEPENDENT_AMBULATORY_CARE_PROVIDER_SITE_OTHER): Payer: Medicare PPO | Admitting: Family Medicine

## 2013-06-02 VITALS — BP 123/69 | HR 72 | Temp 98.5°F | Wt 157.0 lb

## 2013-06-02 DIAGNOSIS — I10 Essential (primary) hypertension: Secondary | ICD-10-CM

## 2013-06-02 DIAGNOSIS — R109 Unspecified abdominal pain: Secondary | ICD-10-CM | POA: Insufficient documentation

## 2013-06-02 NOTE — Assessment & Plan Note (Signed)
BP appropriate Cont current therapy

## 2013-06-02 NOTE — Progress Notes (Signed)
[name] is a 72 y.o. female who presents to [facility] today for stomach pain  Started 2 days ago. Started in the morning after having pie and ice cream Sunday night. Peas and ham for dinner but no other people in home are sick. Pt w/ known lactose intolerance. Complete relief w/ ibuprofen (200mg ), but pain returns several hours later. Some bloated and crampy feeling. Denies n/v/d/c, hematochezia, hematemesis. Daily soft BMs.   The following portions of the patient's history were reviewed and updated as appropriate: allergies, current medications, past medical history, family and social history, and problem list.  Patient is a nonsmoker.   Past Medical History  Diagnosis Date  . History of thyroid cancer   . ETD (eustachian tube dysfunction)   . CTS (carpal tunnel syndrome)   . Anemia   . Gastritis   . Postmenopausal   . Hypothyroid   . Cor pulmonale   . Hypertension, benign   . Diabetes mellitus, type II   . Hypercholesteremia   . GERD (gastroesophageal reflux disease)   . Coronary artery disease     a. Remote PTCA of the PDA;  b. 02/2009 PCI/CBA to the D1;  c. 11/2011 Cath: LM nl, LAD 20p, 81m, D1 patent, RI 20p, LCX nl, RCA 30d, EF 55-65%.  . History of papillary adenocarcinoma of thyroid 1992    right thyroid lobectomy    ROS as above otherwise neg.    Medications reviewed. Current Outpatient Prescriptions  Medication Sig Dispense Refill  . amLODipine (NORVASC) 10 MG tablet Take 1 tablet (10 mg total) by mouth daily.  90 tablet  3  . aspirin 81 MG chewable tablet Chew 81 mg by mouth daily.        . Blood Glucose Monitoring Suppl (ONE TOUCH ULTRA MINI) W/DEVICE KIT AS DIRECTED  1 each  0  . carvedilol (COREG) 25 MG tablet Take 1 tablet (25 mg total) by mouth 2 (two) times daily with a meal.  180 tablet  3  . fluticasone (FLONASE) 50 MCG/ACT nasal spray Place 2 sprays into the nose daily as needed. For allergies  16 g  6  . glucose blood (ONE TOUCH TEST STRIPS) test strip Use  as instructed  100 each  12  . hydrochlorothiazide (HYDRODIURIL) 25 MG tablet TAKE ONE TABLET BY MOUTH EVERY DAY  90 tablet  3  . insulin glargine (LANTUS) 100 UNIT/ML injection Inject 0.16 mLs (16 Units total) into the skin every morning.  10 mL  11  . Insulin Syringe-Needle U-100 (INSULIN SYRINGE 1CC/31GX5/16") 31G X 5/16" 1 ML MISC 1 application by Does not apply route 1 day or 1 dose.  100 each  6  . isosorbide mononitrate (IMDUR) 60 MG 24 hr tablet Take 60 mg by mouth daily.      . isosorbide mononitrate (IMDUR) 60 MG 24 hr tablet TAKE ONE TABLET BY MOUTH EVERY DAY  30 tablet  1  . Lancets (ONETOUCH ULTRASOFT) lancets Check blood sugars tid with meals and before bed  200 each  12  . levothyroxine (SYNTHROID, LEVOTHROID) 50 MCG tablet Take 1 tablet (50 mcg total) by mouth daily.  90 tablet  3  . lisinopril (PRINIVIL,ZESTRIL) 20 MG tablet Take 2 tablets (40 mg total) by mouth daily.  180 tablet  3  . loratadine-pseudoephedrine (CLARITIN-D 24-HOUR) 10-240 MG per 24 hr tablet Take 1 tablet by mouth daily as needed. For allergies      . metFORMIN (GLUCOPHAGE-XR) 500 MG 24 hr tablet Take  4 tablets (2,000 mg total) by mouth daily with breakfast.      . nitroGLYCERIN (NITROSTAT) 0.4 MG SL tablet Place 1 tablet (0.4 mg total) under the tongue every 5 (five) minutes as needed for chest pain.  25 tablet  3  . pravastatin (PRAVACHOL) 40 MG tablet Take 1 tablet (40 mg total) by mouth daily.  90 tablet  3   Current Facility-Administered Medications  Medication Dose Route Frequency Provider Last Rate Last Dose  . nitroGLYCERIN (NITROSTAT) SL tablet 0.4 mg  0.4 mg Sublingual Once Dayarmys [place] de Criselda Peaches, MD        Exam: BP 123/69  Pulse 72  Temp(Src) 98.5 F (36.9 C) (Oral)  Wt 157 lb (71.215 kg)  BMI 28.24 kg/m2  LMP 11/24/1992 Gen: Well NAD HEENT: EOMI,  MMM Lungs: CTABL Nl WOB Heart: RRR no MRG Abd: NABS, NT, ND Skin: intact, warm, well perfused   No results found for this or any  previous visit (from the past 72 hour(s)).

## 2013-06-02 NOTE — Assessment & Plan Note (Signed)
Likely secondary to lactose intolerance.  Cont ibuprofen prn and Gas-x for comfort Counseled to try lactaid in the future before icecream or other dairy products.  Handouts and precautions given

## 2013-06-02 NOTE — Patient Instructions (Addendum)
You are still having symptoms from your ice cream Continue taking ibuprofen 200mg  as needed You can also drink lots of fluids and try walking to help. Please try Gas-x if needed Try Lactaid in the future before your eat ice cream Please call if you have any future concerns  Lactose-Free Diet Lactose is a carbohydrate that is found mainly in milk and milk products, as well as in foods with added milk or whey. Lactose must be digested by the enzyme lactase in order to be used by the body. Lactose intolerance occurs when there is a shortage of lactase. When your body is not able to digest lactose, you may feel sick to your stomach (nausea), bloated, and have cramps, gas, and diarrhea. TYPES OF LACTASE DEFICIENCY  Primary lactase deficiency. This is the most common type. It is characterized by a slow decrease in lactase activity.  Secondary lactase deficiency. This occurs following injury to the small intestinal mucosa as a result of a disease or condition. It can also occur as a result of surgery or after treatment with antibiotic medicines or cancer drugs. Tolerance to lactose varies widely. Each person must determine how much milk can be consumed without developing symptoms. Drinking smaller portions of milk throughout the day may be helpful. Some studies suggest that slowing gastric emptying may help increase tolerance of milk products. This may be done by:  Consuming milk or milk products with a meal rather than alone.  Consuming milk with a higher fat content. There are many dairy products that may be tolerated better than milk by some people, including:  Cheese (especially aged cheese). The lactose content is much lower than in milk.  Cultured dairy products, such as yogurt, buttermilk, cottage cheese, and sweet acidophilus milk (kefir). These products are usually well tolerated by lactase-deficient people. This is because the healthy bacteria help digest lactose.  Lactose-hydrolyzed  milk. This product contains 40% to 90% less lactose than milk and may also be well tolerated. ADEQUACY These diets may be deficient in calcium, riboflavin, and vitamin D, according to the Recommended Dietary Allowances of the [employer]. Depending on individual tolerances and the use of milk substitutes, milk, or other dairy products, you may be able to meet these recommendations. SPECIAL NOTES  Lactose is a carbohydrate. The main food source for lactose is dairy products. Reading food labels is important. Many products contain lactose even when they are not made from milk. Look for the following words: whey, milk solids, dry milk solids, nonfat dry milk powder. Typical sources of lactose other than dairy products include breads, candies, cold cuts, prepared and processed foods, and commercial sauces and gravies.  All foods must be prepared without milk, cream, or other dairy foods.  A vitamin or mineral supplement may be necessary. Consult your caregiver or Registered Dietitian.  Lactose is also found in many prescription and over-the-counter medicines.  Soy milk and lactose-free supplements may be used as an alternative to milk. CHOOSING FOODS Breads and Starches  Allowed: Breads and rolls made without milk. [place], [place], or [place] bread. Soda crackers, graham crackers. Any crackers prepared without lactose. Cooked or dry cereals prepared without lactose (read labels). Any potatoes, pasta, or rice prepared without milk or lactose. Popcorn.  Avoid: Breads and rolls that contain milk. Prepared mixes such as muffins, biscuits, waffles, pancakes. Sweet rolls, donuts, [place] toast (if made with milk or lactose). Zwieback crackers, corn curls, or any crackers that contain lactose. Cooked or dry cereals prepared with lactose (  read labels). Instant potatoes, frozen [place] fries, scalloped or au gratin potatoes. Vegetables  Allowed: Fresh, frozen, and canned vegetables.  Avoid:  Creamed or breaded vegetables. Vegetables in a cheese sauce or with lactose-containing margarines. Fruit  Allowed: All fresh, canned, or frozen fruits that are not processed with lactose.  Avoid: Any canned or frozen fruits processed with lactose. Meat and Meat Substitutes  Allowed: Plain beef, chicken, fish, [place], lamb, veal, pork, or ham. Kosher prepared meat products. Strained or junior meats that do not contain milk. Eggs, soy meat substitutes, nuts.  Avoid: Scrambled eggs, omelets, and souffles that contain milk. Creamed or breaded meat, fish, or fowl. Sausage products such as wieners, liver sausage, or cold cuts that contain milk solids. Cheese, cottage cheese, or cheese spreads. Milk  Allowed: None.  Avoid: Milk (whole, 2%, skim, or chocolate). Evaporated, powdered, or condensed milk. Malted milk. Soups and Combination Foods  Allowed: Bouillon, broth, vegetable soups, clear soups, consomms. Homemade soups made with allowed ingredients. Combination or prepared foods that do not contain milk or milk products (read labels).  Avoid: Cream soups, chowders, commercially prepared soups containing lactose. Macaroni and cheese, pizza. Combination or prepared foods that contain milk or milk products. Desserts and Sweets  Allowed: Water and fruit ices, gelatin, angel food cake. Homemade cookies, pies, or cakes made from allowed ingredients. Pudding (if made with water or a milk substitute). Lactose-free tofu desserts. Sugar, honey, corn syrup, jam, jelly, marmalade, molasses (beet sugar). Pure sugar candy, marshmallows.  Avoid: Ice cream, ice milk, sherbet, custard, pudding, frozen yogurt. Commercial cake and cookie mixes. Desserts that contain chocolate. Pie crust made with milk-containing margarine. Reduced calorie desserts made with a sugar substitute that contains lactose. Toffee, peppermint, butterscotch, chocolate, caramels. Fats and Oils  Allowed: Butter (as tolerated, contains very  small amounts of lactose). Margarines and dressings that do not contain milk. Vegetable oils, shortening, mayonnaise, nondairy cream and whipped toppings without lactose or milk solids added. [name].  Avoid: Margarines and salad dressings containing milk. Cream, cream cheese, peanut butter with added milk solids, sour cream, chip dips made with sour cream. Beverages  Allowed: Carbonated drinks, tea, coffee and freeze-dried coffee, some instant coffees (check labels). Fruit drinks, fruit and vegetable juice, rice or soy milk.  Avoid: Hot chocolate. Some cocoas, some instant coffees, instant iced teas, powdered fruit drinks (read labels). Condiments  Allowed: Soy sauce, carob powder, olives, gravy made with water, baker's cocoa, pickles, pure seasonings and spices, wine, pure monosodium glutamate, catsup, mustard.  Avoid: Some chewing gums, chocolate, some cocoas. Certain antibiotics and vitamin or mineral preparations. Spice blends if they contain milk products. MSG extender. Artificial sweeteners that contain lactose. Some nondairy creamers (read labels). SAMPLE MENU Breakfast  Orange juice.  Banana.  Bran cereal.  Nondairy creamer.  Vienna bread, toasted.  Butter or milk-free margarine.  Coffee or tea. Lunch  Chicken breast.  Rice.  Green beans.  Butter or milk-free margarine.  Fresh melon.  Coffee or tea. Dinner  [place].  Baked potato.  Butter or milk-free margarine.  Broccoli.  Lettuce salad with vinegar and oil dressing.  [employer].  Coffee or tea. Document Released: 05/31/2002 Document Revised: 03/02/2012 Document Reviewed: 03/08/2011 [facility] Patient Information 2014 [place], [place].   Lactose Intolerance, Adult Lactose intolerance is when the body is not able to digest lactose, a sugar found in milk and milk products. Lactose intolerance is caused by your body not producing enough of the enzyme lactase. When there is not enough  lactase to  digest the amount of lactose consumed, discomfort may be felt. Lactose intolerance is not a milk allergy. For most people, lactase deficiency is a condition that develops naturally over time. After about the age of 2, the body begins to produce less lactase. But many people may not experience symptoms until they are much older. CAUSES Things that can cause you to be lactose intolerant include:  Aging.  Being born without the ability to make lactase.  Certain digestive diseases.  Injuries to the small intestine. SYMPTOMS   Feeling sick to your stomach (nauseous).  Diarrhea.  Cramps.  Bloating.  Gas. Symptoms usually show up a half hour or 2 hours after eating or drinking products containing lactose. TREATMENT  No treatment can improve the body's ability to produce lactase. However, symptoms can be controlled through diet. A medicine may be given to you to take when you consume lactose-containing foods or drinks. The medicine contains the lactase enzyme, which help the body digest lactose better. HOME CARE INSTRUCTIONS  Eat or drink dairy products as told by your caregiver or dietician.  Take all medicine as directed by your caregiver.  Find lactose-free or lactose-reduced products at your local grocery store.  Talk to your caregiver or dietician to decide if you need any dietary supplements. The following is the amount of calcium needed from the diet:  19 to 50 years: 1000 mg  Over 50 years: 1200 mg Calcium and Lactose in Common Foods Non-Dairy Products / Calcium Content (mg)  Calcium-fortified orange juice, 1 cup / 308 to 344 mg  Sardines, with edible bones, 3 oz / 270 mg  Salmon, canned, with edible bones, 3 oz / 205 mg  Soymilk, fortified, 1 cup / 200 mg  Broccoli (raw), 1 cup / 90 mg  Orange, 1 medium / 50 mg  Pinto beans,  cup / 40 mg  Tuna, canned, 3 oz / 10 mg  Lettuce greens,  cup / 10 mg Dairy Products / Calcium Content (mg) / Lactose Content  (g)  Yogurt, plain, low-fat, 1 cup / 415 mg / 5 g  Milk, reduced fat, 1 cup / 295 mg / 11 g  Swiss cheese, 1 oz / 270 mg / 1 g  Ice cream,  cup / 85 mg / 6 g  Cottage cheese,  cup / 75 mg / 2 to 3 g SEEK MEDICAL CARE IF: You have no relief from your symptoms. Document Released: 12/09/2005 Document Revised: 03/02/2012 Document Reviewed: 03/08/2011 [facility] Patient Information 2014 [place], [place].

## 2013-06-18 ENCOUNTER — Other Ambulatory Visit: Payer: Self-pay | Admitting: *Deleted

## 2013-06-18 MED ORDER — ISOSORBIDE MONONITRATE ER 60 MG PO TB24: 60.0000 mg | ORAL_TABLET | Freq: Every day | ORAL | Status: DC

## 2013-06-18 MED ORDER — "INSULIN SYRINGE 31G X 5/16"" 1 ML MISC": 1.0000 "application " | Status: DC

## 2013-06-23 ENCOUNTER — Ambulatory Visit (INDEPENDENT_AMBULATORY_CARE_PROVIDER_SITE_OTHER): Payer: Medicare PPO | Admitting: Family Medicine

## 2013-06-23 ENCOUNTER — Encounter: Payer: Self-pay | Admitting: Family Medicine

## 2013-06-23 VITALS — BP 136/72 | HR 78 | Ht 62.5 in | Wt 156.0 lb

## 2013-06-23 DIAGNOSIS — R109 Unspecified abdominal pain: Secondary | ICD-10-CM

## 2013-06-23 NOTE — Patient Instructions (Addendum)
If you continue to have crampy abdominal pain and diarrhea, stop the Metformin for 3 days to see if it improves. 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   Avoid dairy products. Use imodium as needed for diarrhea.   Also, call about the colonoscopy.   Sincerely,   Dr. Clinton Sawyer

## 2013-06-23 NOTE — Progress Notes (Signed)
  Subjective:    Patient ID: [name], female    DOB: [birth year], 72 y.o.   MRN: [id number]  HPI  72 year old F with abdominal pain.   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

## 2013-06-24 NOTE — Assessment & Plan Note (Signed)
Unclear etiology, but patient without red flags of weight loss, melena, hematochezia, or persistent symptoms. Differential includes infectious diarrhea acquired at her job vs Metformin induced GI discomfort. Will continue to monitor since she is stable - If symptoms recur, stop Metformin for 3 days to see if improve - If persistent after stopping Metformin, return to clinic for eval and possible stool culture - Encouraged to obtain colonoscopy (last in 2004)

## 2013-07-01 ENCOUNTER — Other Ambulatory Visit: Payer: Self-pay

## 2013-09-08 ENCOUNTER — Other Ambulatory Visit: Payer: Self-pay | Admitting: Family Medicine

## 2013-09-21 ENCOUNTER — Other Ambulatory Visit: Payer: Self-pay

## 2013-09-21 DIAGNOSIS — Z1231 Encounter for screening mammogram for malignant neoplasm of breast: Secondary | ICD-10-CM

## 2013-10-12 ENCOUNTER — Encounter: Payer: Self-pay | Admitting: Family Medicine

## 2013-10-12 ENCOUNTER — Ambulatory Visit (INDEPENDENT_AMBULATORY_CARE_PROVIDER_SITE_OTHER): Payer: Medicare PPO | Admitting: Family Medicine

## 2013-10-12 VITALS — BP 139/55 | HR 79 | Temp 98.9°F | Wt 158.0 lb

## 2013-10-12 DIAGNOSIS — M25559 Pain in unspecified hip: Secondary | ICD-10-CM

## 2013-10-12 DIAGNOSIS — E119 Type 2 diabetes mellitus without complications: Secondary | ICD-10-CM

## 2013-10-12 DIAGNOSIS — IMO0001 Reserved for inherently not codable concepts without codable children: Secondary | ICD-10-CM

## 2013-10-12 DIAGNOSIS — E1165 Type 2 diabetes mellitus with hyperglycemia: Secondary | ICD-10-CM

## 2013-10-12 DIAGNOSIS — R229 Localized swelling, mass and lump, unspecified: Secondary | ICD-10-CM

## 2013-10-12 DIAGNOSIS — I10 Essential (primary) hypertension: Secondary | ICD-10-CM

## 2013-10-12 DIAGNOSIS — M25552 Pain in left hip: Secondary | ICD-10-CM

## 2013-10-12 MED ORDER — ISOSORBIDE MONONITRATE ER 60 MG PO TB24: 60.0000 mg | ORAL_TABLET | Freq: Every day | ORAL | Status: DC

## 2013-10-12 MED ORDER — INSULIN GLARGINE 100 UNIT/ML ~~LOC~~ SOLN: 20.0000 [IU] | Freq: Every morning | SUBCUTANEOUS | Status: DC

## 2013-10-12 NOTE — Patient Instructions (Signed)
Hip Exercises  RANGE OF MOTION (ROM) AND STRETCHING EXERCISES   These exercises may help you when beginning to rehabilitate your injury. Doing them too aggressively can worsen your condition. Complete them slowly and gently. Your symptoms may resolve with or without further involvement from your physician, physical therapist or athletic trainer. While completing these exercises, remember:   · Restoring tissue flexibility helps normal motion to return to the joints. This allows healthier, less painful movement and activity.  · An effective stretch should be held for at least 30 seconds.  · A stretch should never be painful. You should only feel a gentle lengthening or release in the stretched tissue. If these stretches worsen your symptoms even when done gently, consult your physician, physical therapist or athletic trainer.  STRETCH Hamstrings, Supine   · Lie on your back. Loop a belt or towel over the ball of your right / left foot.  · Straighten your right / left knee and slowly pull on the belt to raise your leg. Do not allow the right / left knee to bend. Keep your opposite leg flat on the floor.  · Raise the leg until you feel a gentle stretch behind your right / left knee or thigh. Hold this position for __________ seconds.  Repeat __________ times. Complete this stretch __________ times per day.   STRETCH - Hip Rotators   · Lie on your back on a firm surface. Grasp your right / left knee with your right / left hand and your ankle with your opposite hand.  · Keeping your hips and shoulders firmly planted, gently pull your right / left knee and rotate your lower leg toward your opposite shoulder until you feel a stretch in your buttocks.  · Hold this stretch for __________ seconds.  Repeat this stretch __________ times. Complete this stretch __________ times per day.  STRETCH - Hamstrings/Adductors, V-Sit   · Sit on the floor with your legs extended in a large "V," keeping your knees straight.  · With your head  and chest upright, bend at your waist reaching for your right foot to stretch your left adductors.  · You should feel a stretch in your left inner thigh. Hold for __________ seconds.  · Return to the upright position to relax your leg muscles.  · Continuing to keep your chest upright, bend straight forward at your waist to stretch your hamstrings.  · You should feel a stretch behind both of your thighs and/or knees. Hold for __________ seconds.  · Return to the upright position to relax your leg muscles.  · Repeat steps 2 through 4 for opposite leg.  Repeat __________ times. Complete this exercise __________ times per day.   STRETCHING - Hip Flexors, Lunge  · Half kneel with your right / left knee on the floor and your opposite knee bent and directly over your ankle.  · Keep good posture with your head over your shoulders. Tighten your buttocks to point your tailbone downward; this will prevent your back from arching too much.  · You should feel a gentle stretch in the front of your thigh and/or hip. If you do not feel any resistance, slightly slide your opposite foot forward and then slowly lunge forward so your knee once again lines up over your ankle. Be sure your tailbone remains pointed downward.  · Hold this stretch for __________ seconds.  Repeat __________ times. Complete this stretch __________ times per day.  STRENGTHENING EXERCISES  These exercises may help you   when beginning to rehabilitate your injury. They may resolve your symptoms with or without further involvement from your physician, physical therapist or athletic trainer. While completing these exercises, remember:   · Muscles can gain both the endurance and the strength needed for everyday activities through controlled exercises.  · Complete these exercises as instructed by your physician, physical therapist or athletic trainer. Progress the resistance and repetitions only as guided.  · You may experience muscle soreness or fatigue, but the pain  or discomfort you are trying to eliminate should never worsen during these exercises. If this pain does worsen, stop and make certain you are following the directions exactly. If the pain is still present after adjustments, discontinue the exercise until you can discuss the trouble with your clinician.  STRENGTH - Hip Extensors, Bridge   · Lie on your back on a firm surface. Bend your knees and place your feet flat on the floor.  · Tighten your buttocks muscles and lift your bottom off the floor until your trunk is level with your thighs. You should feel the muscles in your buttocks and back of your thighs working. If you do not feel these muscles, slide your feet 1-2 inches further away from your buttocks.  · Hold this position for __________ seconds.  · Slowly lower your hips to the starting position and allow your buttock muscles relax completely before beginning the next repetition.  · If this exercise is too easy, you may cross your arms over your chest.  Repeat __________ times. Complete this exercise __________ times per day.   STRENGTH - Hip Abductors, Straight Leg Raises   Be aware of your form throughout the entire exercise so that you exercise the correct muscles. Sloppy form means that you are not strengthening the correct muscles.  · Lie on your side so that your head, shoulders, knee and hip line up. You may bend your lower knee to help maintain your balance. Your right / left leg should be on top.  · Roll your hips slightly forward, so that your hips are stacked directly over each other and your right / left knee is facing forward.  · Lift your top leg up 4-6 inches, leading with your heel. Be sure that your foot does not drift forward or that your knee does not roll toward the ceiling.  · Hold this position for __________ seconds. You should feel the muscles in your outer hip lifting (you may not notice this until your leg begins to tire).  · Slowly lower your leg to the starting position. Allow the  muscles to fully relax before beginning the next repetition.  Repeat __________ times. Complete this exercise __________ times per day.   STRENGTH - Hip Adductors, Straight Leg Raises   · Lie on your side so that your head, shoulders, knee and hip line up. You may place your upper foot in front to help maintain your balance. Your right / left leg should be on the bottom.  · Roll your hips slightly forward, so that your hips are stacked directly over each other and your right / left knee is facing forward.  · Tense the muscles in your inner thigh and lift your bottom leg 4-6 inches. Hold this position for __________ seconds.  · Slowly lower your leg to the starting position. Allow the muscles to fully relax before beginning the next repetition.  Repeat __________ times. Complete this exercise __________ times per day.   STRENGTH - Quadriceps, Straight Leg Raises     Quality counts! Watch for signs that the quadriceps muscle is working to insure you are strengthening the correct muscles and not "cheating" by substituting with healthier muscles.  · Lay on your back with your right / left leg extended and your opposite knee bent.  · Tense the muscles in the front of your right / left thigh. You should see either your knee cap slide up or increased dimpling just above the knee. Your thigh may even quiver.  · Tighten these muscles even more and raise your leg 4 to 6 inches off the floor. Hold for right / left seconds.  · Keeping these muscles tense, lower your leg.  · Relax the muscles slowly and completely in between each repetition.  Repeat __________ times. Complete this exercise __________ times per day.   STRENGTH - Hip Abductors, Standing  · Tie one end of a rubber exercise band/tubing to a secure surface (table, pole) and tie a loop at the other end.  · Place the loop around your right / left ankle. Keeping your ankle with the band directly opposite of the secured end, step away until there is tension in the  tube/band.  · Hold onto a chair as needed for balance.  · Keeping your back upright, your shoulders over your hips, and your toes pointing forward, lift your right / left leg out to your side. Be sure to lift your leg with your hip muscles. Do not "throw" your leg or tip your body to lift your leg.  · Slowly and with control, return to the starting position.  Repeat exercise __________ times. Complete this exercise __________ times per day.   STRENGTH  Quadriceps, Squats  · Stand in a door frame so that your feet and knees are in line with the frame.  · Use your hands for balance, not support, on the frame.  · Slowly lower your weight, bending at the hips and knees. Keep your lower legs upright so that they are parallel with the door frame. Squat only within the range that does not increase your knee pain. Never let your hips drop below your knees.  · Slowly return upright, pushing with your legs, not pulling with your hands.  Document Released: 12/27/2005 Document Revised: 03/02/2012 Document Reviewed: 03/23/2009  [employer]® Patient Information ©2014 [employer].

## 2013-10-12 NOTE — Assessment & Plan Note (Signed)
A1C checked today 8.1 despite medication compliance. Continue current dose of Metformin. I increased her Lantus to 20 unit daily from 16 unit. Advised to continue home glucose check, f/u in 2-4 wks for reassessment.  Foot Exam done: No sensory loss,+Bunion. Foot care instruction given.

## 2013-10-12 NOTE — Assessment & Plan Note (Signed)
On scalp and upper back. Likely Lipoma vs simple cyst. Scalp nodule noticed just 1 wk ago,monitor for now,if getting bigger would image. Patient advised to follow up in 2 wks for reassessment,she agreed with plan.

## 2013-10-12 NOTE — Progress Notes (Signed)
Subjective:     Patient ID: [name], female   DOB: August 28, 1941, 72 y.o.   MRN: [id number]  HPI Knee/Hip pain: more on the hip,no pain at the moment but at times 8/10. No fall or trauma. Extra strength tylenol, improves with medication, pain worse with ambulation. Pain for 1 month,she only had 2 episode in the last 1 month. NF:[id number] with Metformin ER 500mg  2 tab BID, Lantus 16 unit daily,denies hypoglycemic episode,home capillary glucose check ranges from 99 to 198. [id number] with Lisinopril,here for follow up,no complaints. Took her BP medication few min ago. Bump on scalp:C/O bump on her scalp which she noticed 1 week ago,she denies any trauma,no pain.Denies hx of similar nodule in the past.  Current Outpatient Prescriptions on File Prior to Visit  Medication Sig Dispense Refill  . amLODipine (NORVASC) 10 MG tablet Take 1 tablet (10 mg total) by mouth daily.  90 tablet  3  . aspirin 81 MG chewable tablet Chew 81 mg by mouth daily.        . Blood Glucose Monitoring Suppl (ONE TOUCH ULTRA MINI) W/DEVICE KIT AS DIRECTED  1 each  0  . carvedilol (COREG) 25 MG tablet Take 1 tablet (25 mg total) by mouth 2 (two) times daily with a meal.  180 tablet  3  . glucose blood (ONE TOUCH TEST STRIPS) test strip Use as instructed  100 each  12  . hydrochlorothiazide (HYDRODIURIL) 25 MG tablet TAKE ONE TABLET BY MOUTH EVERY DAY  90 tablet  3  . insulin glargine (LANTUS) 100 UNIT/ML injection Inject 0.16 mLs (16 Units total) into the skin every morning.  10 mL  11  . Insulin Syringe-Needle U-100 (INSULIN SYRINGE 1CC/31GX5/16") 31G X 5/16" 1 ML MISC 1 application by Does not apply route 1 day or 1 dose.  100 each  2  . isosorbide mononitrate (IMDUR) 60 MG 24 hr tablet TAKE ONE TABLET BY MOUTH EVERY DAY  30 tablet  1  . Lancets (ONETOUCH ULTRASOFT) lancets Check blood sugars tid with meals and before bed  200 each  12  . levothyroxine (SYNTHROID, LEVOTHROID) 50 MCG tablet Take 1 tablet (50 mcg  total) by mouth daily.  90 tablet  3  . lisinopril (PRINIVIL,ZESTRIL) 20 MG tablet Take 2 tablets (40 mg total) by mouth daily.  180 tablet  3  . metFORMIN (GLUCOPHAGE-XR) 500 MG 24 hr tablet Take 4 tablets (2,000 mg total) by mouth daily with breakfast.      . nitroGLYCERIN (NITROSTAT) 0.4 MG SL tablet Place 1 tablet (0.4 mg total) under the tongue every 5 (five) minutes as needed for chest pain.  25 tablet  3  . pravastatin (PRAVACHOL) 40 MG tablet Take 1 tablet (40 mg total) by mouth daily.  90 tablet  3  . fluticasone (FLONASE) 50 MCG/ACT nasal spray Place 2 sprays into the nose daily as needed. For allergies  16 g  6  . loratadine-pseudoephedrine (CLARITIN-D 24-HOUR) 10-240 MG per 24 hr tablet Take 1 tablet by mouth daily as needed. For allergies       Current Facility-Administered Medications on File Prior to Visit  Medication Dose Route Frequency Provider Last Rate Last Dose  . nitroGLYCERIN (NITROSTAT) SL tablet 0.4 mg  0.4 mg Sublingual Once Dayarmys [place] de Criselda Peaches, MD       Past Medical History  Diagnosis Date  . History of thyroid cancer   . ETD (eustachian tube dysfunction)   . CTS (carpal tunnel syndrome)   .  Anemia   . Gastritis   . Postmenopausal   . Hypothyroid   . Cor pulmonale   . Hypertension, benign   . Diabetes mellitus, type II   . Hypercholesteremia   . GERD (gastroesophageal reflux disease)   . Coronary artery disease     a. Remote PTCA of the PDA;  b. 02/2009 PCI/CBA to the D1;  c. 11/2011 Cath: LM nl, LAD 20p, 52m, D1 patent, RI 20p, LCX nl, RCA 30d, EF 55-65%.  . 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   Cardiovascular: Negative.   Gastrointestinal: Negative.   Genitourinary: Negative.   Musculoskeletal: Positive for arthralgias. Negative for joint swelling and myalgias.  All other systems reviewed and are negative.   Filed Vitals:   10/12/13 0933 10/12/13 0948    BP: 154/66 139/55  Pulse: 79   Temp: 98.9 F (37.2 C)   TempSrc: Oral   Weight: 158 lb (71.668 kg)        Objective:   Physical Exam  Nursing note and vitals reviewed. Constitutional: She is oriented to person, place, and time. She appears well-developed. No distress.  HENT:  Head:    Cardiovascular: Normal rate, regular rhythm, normal heart sounds and intact distal pulses.   No murmur heard. Pulmonary/Chest: Effort normal and breath sounds normal. No respiratory distress. She has no wheezes.  Abdominal: Soft. Bowel sounds are normal. She exhibits no distension. There is no tenderness.  Musculoskeletal: Normal range of motion. She exhibits no edema.       Right hip: Normal.       Left hip: Normal.       Right knee: Normal.       Left knee: Normal.  Sensory exam of the foot is normal, tested with the monofilament. Good pulses, no lesions or ulcers, good peripheral pulses. B/L big toe bunion,more on right.  Neurological: She is alert and oriented to person, place, and time.  Skin: No rash noted.          Assessment:     Hip pain: DM: HTN: Scalp bump     Plan:     Check problem list

## 2013-10-12 NOTE — Assessment & Plan Note (Signed)
BP initially elevated. Recheck BP few min later improved. Continue current BP regimen. I refilled her Imdur.

## 2013-10-12 NOTE — Assessment & Plan Note (Signed)
Currently asymptomatic. Might be touch of arthritis. May continue tylenol prn. Home hip exercise instruction and handout was given. If no improvement will plan to image her.

## 2013-10-14 ENCOUNTER — Ambulatory Visit: Payer: Medicare PPO

## 2013-10-15 ENCOUNTER — Ambulatory Visit (INDEPENDENT_AMBULATORY_CARE_PROVIDER_SITE_OTHER): Payer: Medicare PPO | Admitting: *Deleted

## 2013-10-15 DIAGNOSIS — Z23 Encounter for immunization: Secondary | ICD-10-CM

## 2013-10-20 ENCOUNTER — Ambulatory Visit
Admission: RE | Admit: 2013-10-20 | Discharge: 2013-10-20 | Disposition: A | Discharge status: Home / Self Care | Payer: Medicare PPO | Source: Ambulatory Visit

## 2013-10-20 DIAGNOSIS — Z1231 Encounter for screening mammogram for malignant neoplasm of breast: Secondary | ICD-10-CM

## 2013-10-28 ENCOUNTER — Other Ambulatory Visit: Payer: Self-pay | Admitting: Family Medicine

## 2013-11-02 ENCOUNTER — Ambulatory Visit (INDEPENDENT_AMBULATORY_CARE_PROVIDER_SITE_OTHER): Payer: Medicare PPO | Admitting: Family Medicine

## 2013-11-02 ENCOUNTER — Encounter: Payer: Self-pay | Admitting: Family Medicine

## 2013-11-02 VITALS — BP 152/60 | HR 73 | Temp 98.7°F | Ht 62.0 in | Wt 159.7 lb

## 2013-11-02 DIAGNOSIS — E1165 Type 2 diabetes mellitus with hyperglycemia: Secondary | ICD-10-CM

## 2013-11-02 DIAGNOSIS — M25559 Pain in unspecified hip: Secondary | ICD-10-CM

## 2013-11-02 DIAGNOSIS — R229 Localized swelling, mass and lump, unspecified: Secondary | ICD-10-CM

## 2013-11-02 DIAGNOSIS — IMO0002 Reserved for concepts with insufficient information to code with codable children: Secondary | ICD-10-CM

## 2013-11-02 DIAGNOSIS — IMO0001 Reserved for inherently not codable concepts without codable children: Secondary | ICD-10-CM

## 2013-11-02 DIAGNOSIS — I10 Essential (primary) hypertension: Secondary | ICD-10-CM

## 2013-11-02 DIAGNOSIS — M25552 Pain in left hip: Secondary | ICD-10-CM

## 2013-11-02 NOTE — Patient Instructions (Signed)
How to Take Your Blood Pressure  These instructions are only for electronic home blood pressure machines. You will need:   An automatic or semi-automatic blood pressure machine.  Fresh batteries for the blood pressure machine. HOW DO I USE THESE TOOLS TO CHECK MY BLOOD PRESSURE?   There are 2 numbers that make up your blood pressure. For example: 120/80.  The first number (120 in our example) is called the "systolic pressure." It is a measure of the pressure in your blood vessels when your heart is pumping blood.  The second number (80 in our example) is called the "diastolic pressure." It is a measure of the pressure in your blood vessels when your heart is resting between beats.  Before you buy a home blood pressure machine, check the size of your arm so you can buy the right size cuff. Here is how to check the size of your arm:  Use a tape measure that shows both inches and centimeters.  Wrap the tape measure around the middle upper part of your arm. You may need someone to help you measure right.  Write down your arm measurement in both inches and centimeters.  To measure your blood pressure right, it is important to have the right size cuff.  If your arm is up to 13 inches (37 to 34 centimeters), get an adult cuff size.  If your arm is 13 to 17 inches (35 to 44 centimeters), get a large adult cuff size.  If your arm is 17 to 20 inches (45 to 52 centimeters), get an adult thigh cuff.  Try to rest or relax for at least 30 minutes before you check your blood pressure.  Do not smoke.  Do not have any drinks with caffeine, such as:  Pop.  Coffee.  Tea.  Check your blood pressure in a quiet room.  Sit down and stretch out your arm on a table. Keep your arm at about the level of your heart. Let your arm relax. GETTING BLOOD PRESSURE READINGS  Make sure you remove any tight-fighting clothing from your arm. Wrap the cuff around your upper arm. Wrap it just above the bend,  and above where you felt the pulse. You should be able to slip a finger between the cuff and your arm. If you cannot slip a finger in the cuff, it is too tight and should be removed and rewrapped.  Some units requires you to manually pump up the arm cuff.  Automatic units inflate the cuff when you press a button.  Cuff deflation is automatic in both models.  After the cuff is inflated, the unit measures your blood pressure and pulse. The readings are displayed on a monitor. Hold still and breathe normally while the cuff is inflated.  Getting a reading takes less than a minute.  Some models store readings in a memory. Some provide a printout of readings.  Get readings at different times of the day. You should wait at least 5 minutes between readings. Take readings with you to your next doctor's visit. Document Released: 11/21/2008 Document Revised: 03/02/2012 Document Reviewed: 11/21/2008 [employer] Patient Information 2014 [employer].  

## 2013-11-02 NOTE — Assessment & Plan Note (Signed)
BP not optimally controlled for her DM. ?? Poor medication compliance. I recommended HTN med dose adjustment or starting new medication but she declined today. She will like few more weeks to take her medication diligently. F/U in 4 wks for reassessment. To continue home BP check for now.

## 2013-11-02 NOTE — Assessment & Plan Note (Signed)
Poorly controlled. Recently increased Lantus to 20 unit. Continue this dose for now. Continue home glucose check. F/U in 4 wks for reassessment.

## 2013-11-02 NOTE — Assessment & Plan Note (Signed)
Scalp/occiput region. No change in swelling from last visit. Seem benign. Monitor for now to consider biopsy if worsening.

## 2013-11-02 NOTE — Assessment & Plan Note (Signed)
Improved a lot. Continue home PT and Tylenol prn pain.

## 2013-11-02 NOTE — Progress Notes (Signed)
Subjective:     Patient ID: [name], female   DOB: 08/09/1941, 72 y.o.   MRN: [id number]  HPI Hip pain:Left hip improved a lot,she did some of the home hip exercise which helped a lot, here for follow up. DM/HTN: She is compliant with her BP medication which includes Coreg 25 mg BID,Norvasc 10 mg qd,Imdur 60 mg qd,HCTZ 25 mg qd and lisinopril 40 mg qd,last dose was 20 min ago.Here home BP readings has been around 130/80 till this morning when she measured 150/80. Currently on Metformin 2000mg  daily and Lantus 20 unit daily,here home capillary glucose reading ranges from118-131 in the last few days lowest is in the 90s. Denies hypoglycemic episodes. Scalp nodule:Nodule on scalp has reduced in size with no pain.  Current Outpatient Prescriptions on File Prior to Visit  Medication Sig Dispense Refill  . amLODipine (NORVASC) 10 MG tablet Take 1 tablet (10 mg total) by mouth daily.  90 tablet  3  . aspirin 81 MG chewable tablet Chew 81 mg by mouth daily.        . Blood Glucose Monitoring Suppl (ONE TOUCH ULTRA MINI) W/DEVICE KIT AS DIRECTED  1 each  0  . carvedilol (COREG) 25 MG tablet Take 1 tablet (25 mg total) by mouth 2 (two) times daily with a meal.  180 tablet  3  . fluticasone (FLONASE) 50 MCG/ACT nasal spray Place 2 sprays into the nose daily as needed. For allergies  16 g  6  . hydrochlorothiazide (HYDRODIURIL) 25 MG tablet TAKE ONE TABLET BY MOUTH EVERY DAY  90 tablet  3  . insulin glargine (LANTUS) 100 UNIT/ML injection Inject 0.2 mLs (20 Units total) into the skin every morning.  30 mL  3  . Insulin Syringe-Needle U-100 (INSULIN SYRINGE 1CC/31GX5/16") 31G X 5/16" 1 ML MISC 1 application by Does not apply route 1 day or 1 dose.  100 each  2  . isosorbide mononitrate (IMDUR) 60 MG 24 hr tablet Take 1 tablet (60 mg total) by mouth daily.  90 tablet  1  . Lancets (ONETOUCH ULTRASOFT) lancets Check blood sugars tid with meals and before bed  200 each  12  . levothyroxine (SYNTHROID,  LEVOTHROID) 50 MCG tablet Take 1 tablet (50 mcg total) by mouth daily.  90 tablet  3  . lisinopril (PRINIVIL,ZESTRIL) 20 MG tablet Take 2 tablets (40 mg total) by mouth daily.  180 tablet  3  . loratadine-pseudoephedrine (CLARITIN-D 24-HOUR) 10-240 MG per 24 hr tablet Take 1 tablet by mouth daily as needed. For allergies      . metFORMIN (GLUCOPHAGE-XR) 500 MG 24 hr tablet Take 4 tablets (2,000 mg total) by mouth daily with breakfast.      . nitroGLYCERIN (NITROSTAT) 0.4 MG SL tablet Place 1 tablet (0.4 mg total) under the tongue every 5 (five) minutes as needed for chest pain.  25 tablet  3  . ONE TOUCH ULTRA TEST test strip CHECK BLOOD SUGAR  IN THE MORNINGS AND IN THE EVENINGS.  100 each  4  . pravastatin (PRAVACHOL) 40 MG tablet Take 1 tablet (40 mg total) by mouth daily.  90 tablet  3   Current Facility-Administered Medications on File Prior to Visit  Medication Dose Route Frequency Provider Last Rate Last Dose  . nitroGLYCERIN (NITROSTAT) SL tablet 0.4 mg  0.4 mg Sublingual Once Dayarmys [place] de Criselda Peaches, MD       Past Medical History  Diagnosis Date  . History of thyroid cancer   .  ETD (eustachian tube dysfunction)   . CTS (carpal tunnel syndrome)   . Anemia   . Gastritis   . Postmenopausal   . Hypothyroid   . Cor pulmonale   . Hypertension, benign   . Diabetes mellitus, type II   . Hypercholesteremia   . GERD (gastroesophageal reflux disease)   . Coronary artery disease     a. Remote PTCA of the PDA;  b. 02/2009 PCI/CBA to the D1;  c. 11/2011 Cath: LM nl, LAD 20p, 64m, D1 patent, RI 20p, LCX nl, RCA 30d, EF 55-65%.  . 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   Cardiovascular: Negative.   Gastrointestinal: Negative.   Genitourinary: Negative.   All other systems reviewed and are negative.   Filed Vitals:   11/02/13 1020 11/02/13 1033  BP: 162/75 152/60  Pulse: 73   Temp: 98.7 F  (37.1 C)   TempSrc: Oral   Height: 5\' 2"  (1.575 m)   Weight: 159 lb 11.2 oz (72.439 kg)        Objective:   Physical Exam  Nursing note and vitals reviewed. Constitutional: She is oriented to person, place, and time. She appears well-developed. No distress.  HENT:  Head:    Cardiovascular: Normal rate, regular rhythm, normal heart sounds and intact distal pulses.   No murmur heard. Pulmonary/Chest: Effort normal and breath sounds normal. No respiratory distress. She has no wheezes. She exhibits no tenderness.  Abdominal: Soft. Bowel sounds are normal. She exhibits no distension and no mass. There is no tenderness.  Musculoskeletal: Normal range of motion. She exhibits no tenderness.       Right hip: Normal.       Left hip: Normal.  Neurological: She is alert and oriented to person, place, and time.       Assessment:     Hip pain: Left DM2 HTN Subcutaneous nodule/Scalp     Plan:     Check problem list

## 2013-12-02 ENCOUNTER — Ambulatory Visit (INDEPENDENT_AMBULATORY_CARE_PROVIDER_SITE_OTHER): Payer: Medicare PPO | Admitting: Family Medicine

## 2013-12-02 ENCOUNTER — Encounter: Payer: Self-pay | Admitting: Family Medicine

## 2013-12-02 VITALS — BP 150/65 | HR 77 | Temp 98.6°F | Wt 158.0 lb

## 2013-12-02 DIAGNOSIS — I10 Essential (primary) hypertension: Secondary | ICD-10-CM

## 2013-12-02 DIAGNOSIS — E7800 Pure hypercholesterolemia, unspecified: Secondary | ICD-10-CM

## 2013-12-02 DIAGNOSIS — E039 Hypothyroidism, unspecified: Secondary | ICD-10-CM

## 2013-12-02 DIAGNOSIS — E1165 Type 2 diabetes mellitus with hyperglycemia: Secondary | ICD-10-CM

## 2013-12-02 MED ORDER — CLONIDINE HCL 0.1 MG PO TABS: 0.1000 mg | ORAL_TABLET | Freq: Two times a day (BID) | ORAL | Status: DC

## 2013-12-02 NOTE — Progress Notes (Signed)
Subjective:       Patient ID: [name], female   DOB: 10-26-1941, 72 y.o.   MRN: [id number]  HPI HTN/DM: Patient is here for BP and DM f/u she has been compliant with her medications,currently on Novasc 10 mg qd,Coreg 25 mg BID,HCTZ 25 qd,Imdur 60 qd and Lisinopril 40 mg qd for her BP. On Lantus 20 unit qd and Metformin 1000mg  BID. Despite compliance her home BP still running high with occasional normal readings. She came to the clinic today with her BP log.Her home glucose check ranges from 112 lowest to 195 highest. No hypoglycemic episode. Hypothyroidism: Currently on Synthroid 50 mcg. Denies any complaints. HLD:On Pravachol 40 mg qd,she claims compliance. Here for follow up. Current Outpatient Prescriptions on File Prior to Visit  Medication Sig Dispense Refill  . amLODipine (NORVASC) 10 MG tablet Take 1 tablet (10 mg total) by mouth daily.  90 tablet  3  . aspirin 81 MG chewable tablet Chew 81 mg by mouth daily.        . Blood Glucose Monitoring Suppl (ONE TOUCH ULTRA MINI) W/DEVICE KIT AS DIRECTED  1 each  0  . carvedilol (COREG) 25 MG tablet Take 1 tablet (25 mg total) by mouth 2 (two) times daily with a meal.  180 tablet  3  . fluticasone (FLONASE) 50 MCG/ACT nasal spray Place 2 sprays into the nose daily as needed. For allergies  16 g  6  . hydrochlorothiazide (HYDRODIURIL) 25 MG tablet TAKE ONE TABLET BY MOUTH EVERY DAY  90 tablet  3  . insulin glargine (LANTUS) 100 UNIT/ML injection Inject 0.2 mLs (20 Units total) into the skin every morning.  30 mL  3  . Insulin Syringe-Needle U-100 (INSULIN SYRINGE 1CC/31GX5/16") 31G X 5/16" 1 ML MISC 1 application by Does not apply route 1 day or 1 dose.  100 each  2  . isosorbide mononitrate (IMDUR) 60 MG 24 hr tablet Take 1 tablet (60 mg total) by mouth daily.  90 tablet  1  . Lancets (ONETOUCH ULTRASOFT) lancets Check blood sugars tid with meals and before bed  200 each  12  . levothyroxine (SYNTHROID, LEVOTHROID) 50 MCG tablet Take 1  tablet (50 mcg total) by mouth daily.  90 tablet  3  . lisinopril (PRINIVIL,ZESTRIL) 20 MG tablet Take 2 tablets (40 mg total) by mouth daily.  180 tablet  3  . metFORMIN (GLUCOPHAGE-XR) 500 MG 24 hr tablet Take 4 tablets (2,000 mg total) by mouth daily with breakfast.      . ONE TOUCH ULTRA TEST test strip CHECK BLOOD SUGAR  IN THE MORNINGS AND IN THE EVENINGS.  100 each  4  . pravastatin (PRAVACHOL) 40 MG tablet Take 1 tablet (40 mg total) by mouth daily.  90 tablet  3  . loratadine-pseudoephedrine (CLARITIN-D 24-HOUR) 10-240 MG per 24 hr tablet Take 1 tablet by mouth daily as needed. For allergies      . nitroGLYCERIN (NITROSTAT) 0.4 MG SL tablet Place 1 tablet (0.4 mg total) under the tongue every 5 (five) minutes as needed for chest pain.  25 tablet  3   Current Facility-Administered Medications on File Prior to Visit  Medication Dose Route Frequency Provider Last Rate Last Dose  . nitroGLYCERIN (NITROSTAT) SL tablet 0.4 mg  0.4 mg Sublingual Once Dayarmys Piloto de Criselda Peaches, MD       Past Medical History  Diagnosis Date  . History of thyroid cancer   . ETD (eustachian tube dysfunction)   .  CTS (carpal tunnel syndrome)   . Anemia   . Gastritis   . Postmenopausal   . Hypothyroid   . Cor pulmonale   . Hypertension, benign   . Diabetes mellitus, type II   . Hypercholesteremia   . GERD (gastroesophageal reflux disease)   . Coronary artery disease     a. Remote PTCA of the PDA;  b. 02/2009 PCI/CBA to the D1;  c. 11/2011 Cath: LM nl, LAD 20p, 20m, D1 patent, RI 20p, LCX nl, RCA 30d, EF 55-65%.  . 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   Cardiovascular: Negative.   Gastrointestinal: Negative.   Genitourinary: Negative.   Neurological: Negative.   All other systems reviewed and are negative.   Filed Vitals:   12/02/13 1541 12/02/13 1551  BP: 151/66 150/65  Pulse: 77   Temp: 98.6 F (37 C)    TempSrc: Oral   Weight: 158 lb (71.668 kg)        Objective:   Physical Exam  Nursing note and vitals reviewed. Constitutional: She is oriented to person, place, and time. She appears well-developed. No distress.  Cardiovascular: Normal rate, regular rhythm, normal heart sounds and intact distal pulses.   No murmur heard. Pulmonary/Chest: Effort normal and breath sounds normal. No respiratory distress. She has no wheezes. She exhibits no tenderness.  Abdominal: Soft. Bowel sounds are normal. She exhibits no distension and no mass. There is no tenderness.  Musculoskeletal: Normal range of motion. She exhibits no edema.  Neurological: She is alert and oriented to person, place, and time. No cranial nerve deficit.       Assessment:     HTN DM2 Hypothyroidism HLD     Plan:     Check problem list.

## 2013-12-03 NOTE — Assessment & Plan Note (Signed)
BP is not optimally controlled for her DM. I added clonidine 0.1 mg BID to her BP regimen. Continue home BP monitoring. I will reassess at her next visit.

## 2013-12-03 NOTE — Assessment & Plan Note (Signed)
STable on Synthroid 50 mcg. Recheck TSH at next visit.

## 2013-12-03 NOTE — Assessment & Plan Note (Signed)
Stable of Pravachol. Continue diet and exercise in addition to medication. Recheck FLP at next visit.

## 2013-12-03 NOTE — Assessment & Plan Note (Signed)
Compliant with her regimen. Home readings reviewed and improved a lot. To continue current DM regimen and recheck A1C at next visit.

## 2014-01-14 ENCOUNTER — Other Ambulatory Visit: Payer: Self-pay | Admitting: Family Medicine

## 2014-01-18 ENCOUNTER — Encounter (HOSPITAL_COMMUNITY): Payer: Self-pay | Admitting: Emergency Medicine

## 2014-01-18 ENCOUNTER — Observation Stay (HOSPITAL_COMMUNITY)
Admission: EM | Admit: 2014-01-18 | Discharge: 2014-01-19 | Disposition: A | Discharge status: Home / Self Care | Payer: Medicare PPO | Attending: Family Medicine | Admitting: Family Medicine

## 2014-01-18 ENCOUNTER — Emergency Department (HOSPITAL_COMMUNITY): Payer: Medicare PPO

## 2014-01-18 DIAGNOSIS — I270 Primary pulmonary hypertension: Secondary | ICD-10-CM | POA: Diagnosis present

## 2014-01-18 DIAGNOSIS — R079 Chest pain, unspecified: Secondary | ICD-10-CM

## 2014-01-18 DIAGNOSIS — R0789 Other chest pain: Principal | ICD-10-CM | POA: Insufficient documentation

## 2014-01-18 DIAGNOSIS — K219 Gastro-esophageal reflux disease without esophagitis: Secondary | ICD-10-CM | POA: Insufficient documentation

## 2014-01-18 DIAGNOSIS — Z954 Presence of other heart-valve replacement: Secondary | ICD-10-CM | POA: Insufficient documentation

## 2014-01-18 DIAGNOSIS — IMO0002 Reserved for concepts with insufficient information to code with codable children: Secondary | ICD-10-CM | POA: Insufficient documentation

## 2014-01-18 DIAGNOSIS — Z780 Asymptomatic menopausal state: Secondary | ICD-10-CM | POA: Insufficient documentation

## 2014-01-18 DIAGNOSIS — Z794 Long term (current) use of insulin: Secondary | ICD-10-CM | POA: Insufficient documentation

## 2014-01-18 DIAGNOSIS — I10 Essential (primary) hypertension: Secondary | ICD-10-CM | POA: Diagnosis present

## 2014-01-18 DIAGNOSIS — I252 Old myocardial infarction: Secondary | ICD-10-CM | POA: Insufficient documentation

## 2014-01-18 DIAGNOSIS — Z87891 Personal history of nicotine dependence: Secondary | ICD-10-CM | POA: Insufficient documentation

## 2014-01-18 DIAGNOSIS — Z9889 Other specified postprocedural states: Secondary | ICD-10-CM | POA: Insufficient documentation

## 2014-01-18 DIAGNOSIS — E1129 Type 2 diabetes mellitus with other diabetic kidney complication: Secondary | ICD-10-CM | POA: Diagnosis present

## 2014-01-18 DIAGNOSIS — Z7982 Long term (current) use of aspirin: Secondary | ICD-10-CM | POA: Insufficient documentation

## 2014-01-18 DIAGNOSIS — E119 Type 2 diabetes mellitus without complications: Secondary | ICD-10-CM | POA: Diagnosis present

## 2014-01-18 DIAGNOSIS — I2510 Atherosclerotic heart disease of native coronary artery without angina pectoris: Secondary | ICD-10-CM | POA: Diagnosis present

## 2014-01-18 DIAGNOSIS — E039 Hypothyroidism, unspecified: Secondary | ICD-10-CM | POA: Diagnosis present

## 2014-01-18 DIAGNOSIS — Z85850 Personal history of malignant neoplasm of thyroid: Secondary | ICD-10-CM | POA: Insufficient documentation

## 2014-01-18 DIAGNOSIS — Z9861 Coronary angioplasty status: Secondary | ICD-10-CM | POA: Insufficient documentation

## 2014-01-18 DIAGNOSIS — E7800 Pure hypercholesterolemia, unspecified: Secondary | ICD-10-CM | POA: Insufficient documentation

## 2014-01-18 LAB — POCT I-STAT, CHEM 8
BUN: 15 mg/dL (ref 6–23)
CALCIUM ION: 1.23 mmol/L (ref 1.13–1.30)
Chloride: 98 mEq/L (ref 96–112)
Creatinine, Ser: 0.9 mg/dL (ref 0.50–1.10)
GLUCOSE: 263 mg/dL — AB (ref 70–99)
HCT: 36 % (ref 36.0–46.0)
Hemoglobin: 12.2 g/dL (ref 12.0–15.0)
Potassium: 3.2 mEq/L — ABNORMAL LOW (ref 3.7–5.3)
Sodium: 142 mEq/L (ref 137–147)
TCO2: 31 mmol/L (ref 0–100)

## 2014-01-18 LAB — CBC
HCT: 32.7 % — ABNORMAL LOW (ref 36.0–46.0)
Hemoglobin: 10.5 g/dL — ABNORMAL LOW (ref 12.0–15.0)
MCH: 26.4 pg (ref 26.0–34.0)
MCHC: 32.1 g/dL (ref 30.0–36.0)
MCV: 82.2 fL (ref 78.0–100.0)
PLATELETS: 162 10*3/uL (ref 150–400)
RBC: 3.98 MIL/uL (ref 3.87–5.11)
RDW: 15.6 % — ABNORMAL HIGH (ref 11.5–15.5)
WBC: 5.9 10*3/uL (ref 4.0–10.5)

## 2014-01-18 LAB — GLUCOSE, CAPILLARY
GLUCOSE-CAPILLARY: 162 mg/dL — AB (ref 70–99)
GLUCOSE-CAPILLARY: 163 mg/dL — AB (ref 70–99)
GLUCOSE-CAPILLARY: 221 mg/dL — AB (ref 70–99)
Glucose-Capillary: 108 mg/dL — ABNORMAL HIGH (ref 70–99)
Glucose-Capillary: 90 mg/dL (ref 70–99)
Glucose-Capillary: 223 mg/dL — ABNORMAL HIGH (ref 70–99)

## 2014-01-18 LAB — TROPONIN I
Troponin I: <0.3 ng/mL (ref <0.30)
Troponin I: <0.3 ng/mL (ref <0.30)

## 2014-01-18 LAB — LIPID PANEL
Cholesterol: 117 mg/dL (ref 0–200)
HDL: 45 mg/dL (ref >39)
LDL Cholesterol: 49 mg/dL (ref 0–99)
Total CHOL/HDL Ratio: 2.6 RATIO
Triglycerides: 113 mg/dL (ref <150)
VLDL: 23 mg/dL (ref 0–40)

## 2014-01-18 LAB — POCT I-STAT TROPONIN I: TROPONIN I, POC: 0 ng/mL (ref 0.00–0.08)

## 2014-01-18 LAB — HEMOGLOBIN A1C
HEMOGLOBIN A1C: 8.7 % — AB (ref <5.7)
MEAN PLASMA GLUCOSE: 203 mg/dL — AB (ref <117)

## 2014-01-18 LAB — TSH: TSH: 0.796 u[IU]/mL (ref 0.350–4.500)

## 2014-01-18 MED ORDER — CARVEDILOL 25 MG PO TABS
25.0000 mg | ORAL_TABLET | Freq: Two times a day (BID) | ORAL | Status: DC
  Administered 2014-01-18: 25 mg via ORAL
  Filled 2014-01-18 (×4): qty 1

## 2014-01-18 MED ORDER — POTASSIUM CHLORIDE CRYS ER 20 MEQ PO TBCR
40.0000 meq | EXTENDED_RELEASE_TABLET | Freq: Once | ORAL | Status: AC
  Administered 2014-01-18: 40 meq via ORAL
  Filled 2014-01-18: qty 2

## 2014-01-18 MED ORDER — INSULIN GLARGINE 100 UNIT/ML ~~LOC~~ SOLN
10.0000 [IU] | Freq: Every day | SUBCUTANEOUS | Status: DC
  Administered 2014-01-19: 10 [IU] via SUBCUTANEOUS
  Filled 2014-01-18 (×2): qty 0.1

## 2014-01-18 MED ORDER — HEPARIN SODIUM (PORCINE) 5000 UNIT/ML IJ SOLN
5000.0000 [IU] | Freq: Three times a day (TID) | INTRAMUSCULAR | Status: DC
  Administered 2014-01-18 – 2014-01-19 (×2): 5000 [IU] via SUBCUTANEOUS
  Filled 2014-01-18 (×6): qty 1

## 2014-01-18 MED ORDER — MORPHINE SULFATE 4 MG/ML IJ SOLN
4.0000 mg | Freq: Once | INTRAMUSCULAR | Status: DC
  Filled 2014-01-18: qty 1

## 2014-01-18 MED ORDER — INSULIN ASPART 100 UNIT/ML ~~LOC~~ SOLN
0.0000 [IU] | SUBCUTANEOUS | Status: DC
  Administered 2014-01-18: 2 [IU] via SUBCUTANEOUS
  Administered 2014-01-18: 3 [IU] via SUBCUTANEOUS
  Administered 2014-01-19: 2 [IU] via SUBCUTANEOUS
  Administered 2014-01-19: 3 [IU] via SUBCUTANEOUS
  Administered 2014-01-19: 1 [IU] via SUBCUTANEOUS

## 2014-01-18 MED ORDER — ASPIRIN 81 MG PO CHEW
324.0000 mg | CHEWABLE_TABLET | Freq: Once | ORAL | Status: AC
  Administered 2014-01-18: 324 mg via ORAL
  Filled 2014-01-18: qty 4

## 2014-01-18 MED ORDER — LEVOTHYROXINE SODIUM 50 MCG PO TABS
50.0000 ug | ORAL_TABLET | Freq: Every day | ORAL | Status: DC
  Administered 2014-01-19: 50 ug via ORAL
  Filled 2014-01-18 (×2): qty 1

## 2014-01-18 MED ORDER — SODIUM CHLORIDE 0.9 % IV SOLN: INTRAVENOUS | Status: DC

## 2014-01-18 MED ORDER — CLONIDINE HCL 0.1 MG PO TABS
0.1000 mg | ORAL_TABLET | Freq: Two times a day (BID) | ORAL | Status: DC
  Administered 2014-01-18: 0.1 mg via ORAL
  Filled 2014-01-18 (×3): qty 1

## 2014-01-18 MED ORDER — ACETAMINOPHEN 325 MG PO TABS: 650.0000 mg | ORAL_TABLET | ORAL | Status: DC | PRN

## 2014-01-18 MED ORDER — ISOSORBIDE MONONITRATE ER 60 MG PO TB24
60.0000 mg | ORAL_TABLET | Freq: Every day | ORAL | Status: DC
  Administered 2014-01-18 – 2014-01-19 (×2): 60 mg via ORAL
  Filled 2014-01-18 (×2): qty 1

## 2014-01-18 MED ORDER — ATORVASTATIN CALCIUM 40 MG PO TABS
40.0000 mg | ORAL_TABLET | Freq: Every day | ORAL | Status: DC
  Administered 2014-01-18: 40 mg via ORAL
  Filled 2014-01-18 (×2): qty 1

## 2014-01-18 MED ORDER — LISINOPRIL 40 MG PO TABS
40.0000 mg | ORAL_TABLET | Freq: Every day | ORAL | Status: DC
Start: 2014-01-18 — End: 2014-01-19
  Administered 2014-01-18 – 2014-01-19 (×2): 40 mg via ORAL
  Filled 2014-01-18 (×2): qty 1

## 2014-01-18 MED ORDER — ONDANSETRON HCL 4 MG/2ML IJ SOLN: 4.0000 mg | Freq: Four times a day (QID) | INTRAMUSCULAR | Status: DC | PRN

## 2014-01-18 MED ORDER — NITROGLYCERIN 0.4 MG SL SUBL
0.4000 mg | SUBLINGUAL_TABLET | SUBLINGUAL | Status: DC | PRN
  Administered 2014-01-18 (×2): 0.4 mg via SUBLINGUAL
  Filled 2014-01-18 (×2): qty 25

## 2014-01-18 MED ORDER — ONDANSETRON HCL 4 MG/2ML IJ SOLN
4.0000 mg | Freq: Once | INTRAMUSCULAR | Status: DC
  Filled 2014-01-18: qty 2

## 2014-01-18 MED ORDER — AMLODIPINE BESYLATE 10 MG PO TABS
10.0000 mg | ORAL_TABLET | Freq: Every day | ORAL | Status: DC
  Administered 2014-01-18 – 2014-01-19 (×2): 10 mg via ORAL
  Filled 2014-01-18 (×2): qty 1

## 2014-01-18 NOTE — ED Notes (Signed)
Pt states she took NTG at 2300 and another at 2330 which did relieve some pain, states that she has been nauseous. States that she has hx of 2 MIs and stents.

## 2014-01-18 NOTE — Consult Note (Signed)
CONSULT NOTE  Date: 01/18/2014               Patient Name:  [name] MRN: [id number]  DOB: 1941-04-10 Age / Sex: 73 y.o., female        PCP: Andrena Mews Primary Cardiologist: Johnsie Cancel            Referring Physician: Dalbert Mayotte, MD              Reason for Consult: CAD, chest pain.            History of Present Illness: Patient is a 73 y.o. female with a PMHx of  CAD, , who was admitted to [facility] on 01/18/2014 for evaluation of  Chest pain.   Her last stress myoview was in 03/07/13 which revealed no ischemia, mild inferior wall hypokinesis but with a normal overall EF of 72%.    She now presents with right sided shoulder pain.  The pain is not similar to her angina pain.  It was indigestion like pain.  She was lying down - but not asleep.    She took a SL NTG - seemed to help after 5-10 minutes.   Pain lasted a total of 2 hours.     She does not get any regular exercise.   No CP with her usual activities.  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    Non smoker No ETOH  Fx:  + for MI, brother with MI, sister with MI.  Also many with htn and dm.    Medications: Outpatient medications:  (Not in a hospital admission)  Current medications: Current Facility-Administered Medications  Medication Dose Route Frequency Provider Last Rate Last Dose  . amLODipine (NORVASC) tablet 10 mg  10 mg Oral Daily Gerda Diss, DO      . carvedilol (COREG) tablet 25 mg  25 mg Oral BID WC Gerda Diss, DO      . cloNIDine (CATAPRES) tablet 0.1 mg  0.1 mg Oral BID Gerda Diss, DO      . heparin injection 5,000 Units  5,000 Units Subcutaneous Q8H Gerda Diss, DO      . insulin aspart (novoLOG) injection 0-9 Units  0-9 Units Subcutaneous Q4H Gerda Diss, DO      . insulin glargine (LANTUS) injection 10 Units  10 Units Subcutaneous Daily Gerda Diss, DO      . isosorbide mononitrate (IMDUR) 24 hr tablet 60 mg  60 mg Oral Daily Gerda Diss, DO      .  levothyroxine (SYNTHROID, LEVOTHROID) tablet 50 mcg  50 mcg Oral Daily Gerda Diss, DO      . lisinopril (PRINIVIL,ZESTRIL) tablet 40 mg  40 mg Oral Daily Gerda Diss, DO      . morphine 4 MG/ML injection 4 mg  4 mg Intravenous Once Teressa Lower, MD      . nitroGLYCERIN (NITROSTAT) SL tablet 0.4 mg  0.4 mg Sublingual Once Dayarmys Piloto de Gwendalyn Ege, MD      . nitroGLYCERIN (NITROSTAT) SL tablet 0.4 mg  0.4 mg Sublingual Q5 min PRN Teressa Lower, MD   0.4 mg at 01/18/14 0124  . ondansetron (ZOFRAN) injection 4 mg  4 mg Intravenous Once Teressa Lower, MD       Current Outpatient Prescriptions  Medication Sig Dispense Refill  . amLODipine (NORVASC) 10 MG tablet TAKE ONE TABLET BY MOUTH ONCE DAILY  90 tablet  3  . aspirin 81 MG chewable tablet Chew 81 mg by mouth daily.        . Blood Glucose Monitoring Suppl (ONE TOUCH ULTRA MINI) W/DEVICE KIT AS DIRECTED  1 each  0  . carvedilol (COREG) 25 MG tablet Take 1 tablet (25 mg total) by mouth 2 (two) times daily with a meal.  180 tablet  3  . cloNIDine (CATAPRES) 0.1 MG tablet Take 1 tablet (0.1 mg total) by mouth 2 (two) times daily.  60 tablet  3  . fluticasone (FLONASE) 50 MCG/ACT nasal spray Place 2 sprays into the nose daily as needed. For allergies  16 g  6  . hydrochlorothiazide (HYDRODIURIL) 25 MG tablet TAKE ONE TABLET BY MOUTH EVERY DAY  90 tablet  3  . insulin glargine (LANTUS) 100 UNIT/ML injection Inject 0.2 mLs (20 Units total) into the skin every morning.  30 mL  3  . Insulin Syringe-Needle U-100 (INSULIN SYRINGE 1CC/31GX5/16") 31G X 5/16" 1 ML MISC 1 application by Does not apply route 1 day or 1 dose.  100 each  2  . isosorbide mononitrate (IMDUR) 60 MG 24 hr tablet Take 1 tablet (60 mg total) by mouth daily.  90 tablet  1  . Lancets (ONETOUCH ULTRASOFT) lancets Check blood sugars tid with meals and before bed  200 each  12  . levothyroxine (SYNTHROID, LEVOTHROID) 50 MCG tablet Take 1 tablet (50 mcg total) by mouth daily.  90 tablet  3    . lisinopril (PRINIVIL,ZESTRIL) 20 MG tablet Take 2 tablets (40 mg total) by mouth daily.  180 tablet  3  . loratadine-pseudoephedrine (CLARITIN-D 24-HOUR) 10-240 MG per 24 hr tablet Take 1 tablet by mouth daily as needed. For allergies      . metFORMIN (GLUCOPHAGE-XR) 500 MG 24 hr tablet Take 4 tablets (2,000 mg total) by mouth daily with breakfast.      . nitroGLYCERIN (NITROSTAT) 0.4 MG SL tablet Place 1 tablet (0.4 mg total) under the tongue every 5 (five) minutes as needed for chest pain.  25 tablet  3  . ONE TOUCH ULTRA TEST test strip CHECK BLOOD SUGAR  IN THE MORNINGS AND IN THE EVENINGS.  100 each  4  . pravastatin (PRAVACHOL) 40 MG tablet Take 1 tablet (40 mg total) by mouth daily.  90 tablet  3     Allergies  Allergen Reactions  . Codeine     REACTION: GI Intolerance  . Sulfamethoxazole-Trimethoprim     REACTION: Gi intolerence     Past Medical History  Diagnosis Date  . History of thyroid cancer   . ETD (eustachian tube dysfunction)   . CTS (carpal tunnel syndrome)   . Anemia   . Gastritis   . Postmenopausal   . Hypothyroid   . Cor pulmonale   . Hypertension, benign   . Diabetes mellitus, type II   . Hypercholesteremia   . GERD (gastroesophageal reflux disease)   . Coronary artery disease     a. Remote PTCA of the PDA;  b. 02/2009 PCI/CBA to the D1;  c. 11/2011 Cath: LM nl, LAD 20p, 62m D1 patent, RI 20p, LCX nl, RCA 30d, EF 55-65%.  . History of papillary adenocarcinoma of thyroid 1992    right thyroid lobectomy    Past Surgical History  Procedure Laterality Date  . Angioplasty      PDA 90-40 %  . Thyroidectomy Right 10-06-1991    Hashimotos and papillary carcinoma  . Cholecystectomy  08-23-2002  .  Endometrial biopsy  02-20-1998  . Bartholin gland cyst excision  09-13-2004    Keratosis- no CA  . Ptca  10/23/2000 and 03/15/09  . Cardiac catheterization  03/15/2009    stenosis of diagonal branch of LAD- PTCA performed  . Coronary angioplasty    . Tonsillectomy     . Breast biopsy      LEFT SPOT REMOVED  . Tubal ligation      Family History  Problem Relation Age of Onset  . Diabetes Mother     Deceased- hypoglycemic coma   . Hypertension Mother   . Other Father     unaware of his health history - died when pt was 73 yr old.    Social History:  reports that she quit smoking about 33 years ago. Her smoking use included Cigarettes. She smoked 0.00 packs per day. She quit smokeless tobacco use about 19 years ago. Her smokeless tobacco use included Chew. She reports that she does not drink alcohol or use illicit drugs.   Review of Systems: Constitutional:  denies fever, chills, diaphoresis, appetite change and fatigue.  HEENT: denies photophobia, eye pain, redness, hearing loss, ear pain, congestion, sore throat, rhinorrhea, sneezing, neck pain, neck stiffness and tinnitus.  Respiratory: admits to SOB, DOE, occasional cough, chest tightness,   Cardiovascular: admits to chest pain,  Denies any palpitations and leg swelling.  Gastrointestinal: denies nausea, vomiting, abdominal pain, diarrhea, constipation, blood in stool.  Genitourinary: denies dysuria, urgency, frequency, hematuria, flank pain and difficulty urinating.  Musculoskeletal: denies  myalgias, back pain, joint swelling, arthralgias and gait problem.   Skin: denies pallor, rash and wound.  Neurological: denies dizziness, seizures, syncope, weakness, light-headedness, numbness and headaches.   Hematological: denies adenopathy, easy bruising, personal or family bleeding history.  Psychiatric/ Behavioral: denies suicidal ideation, mood changes, confusion, nervousness, sleep disturbance and agitation.    Physical Exam: BP 120/53  Pulse 64  Temp(Src) 98.1 F (36.7 C) (Oral)  Resp 12  Wt 161 lb 7 oz (73.228 kg)  SpO2 100%  LMP 11/24/1992  General: Vital signs reviewed and noted. Well-developed, well-nourished, in no acute distress; alert,   Head: Normocephalic, atraumatic, sclera  anicteric,   Neck: Supple. Negative for carotid bruits. No JVD   Lungs:  Clear bilaterally, no  wheezes, rales, or rhonchi. Breathing is normal   Heart: RRR with S1 S2. No murmurs, rubs, or gallops   Abdomen:  Soft, non-tender, non-distended with normoactive bowel sounds. No hepatomegaly. No rebound/guarding. No obvious abdominal masses   MSK: Strength and the appear normal for age.   Extremities: No clubbing or cyanosis. No edema.  Distal pedal pulses are 2+ and equal bilaterally.  Neurologic: Alert and oriented X 3. Moves all extremities spontaneously.  Psych: Responds to questions appropriately with a normal affect.     Lab results: Basic Metabolic Panel:  Recent Labs Lab 01/18/14 0120  NA 142  K 3.2*  CL 98  GLUCOSE 263*  BUN 15  CREATININE 0.90    Liver Function Tests: No results found for this basename: AST, ALT, ALKPHOS, BILITOT, PROT, ALBUMIN,  in the last 168 hours No results found for this basename: LIPASE, AMYLASE,  in the last 168 hours No results found for this basename: AMMONIA,  in the last 168 hours  CBC:  Recent Labs Lab 01/18/14 0111 01/18/14 0120  WBC 5.9  --   HGB 10.5* 12.2  HCT 32.7* 36.0  MCV 82.2  --   PLT 162  --  Cardiac Enzymes:  Recent Labs Lab 01/18/14 0400 01/18/14 0913  TROPONINI <0.30 <0.30    BNP: No components found with this basename: POCBNP,   CBG:  Recent Labs Lab 01/18/14 0517 01/18/14 1157  GLUCAP 108* 90    Coagulation Studies: No results found for this basename: LABPROT, INR,  in the last 72 hours   Other results:  EKG:  NSR with TWI in the anterior and lateral leads.  No sigificant changes since previous tracings     Imaging: Dg Chest Portable 1 View  01/18/2014   CLINICAL DATA:  Chest pain  EXAM: PORTABLE CHEST - 1 VIEW  COMPARISON:  03/05/2013  FINDINGS: Cardiomediastinal contours within normal range. No confluent airspace opacity, pleural effusion, or pneumothorax. Superior displacement of the  right distal clavicle with widening of the acromioclavicular joint, similar to prior. No acute osseous finding. Surgical clips right upper quadrant.  IMPRESSION: No radiographic evidence of an acute cardiopulmonary process.   Electronically Signed   By: Carlos Levering M.D.   On: 01/18/2014 01:58        Assessment & Plan:  1. CAD:  Patient presents with CP - atypical , right shoulder pain but it was relieved with SL NTG.  She has a hx of CAD.   ECG is unchanged.  Troponin levels are negative.   Will schedule her for [facility] tomorrow.   2. HTN:  BP is well controlled.  Cont. Same meds.  3. Hyperlipidemia:  Chol = 117, trigs = 113, HDL = 45, LDL jis 49.  Cont. Meds.      Thayer Headings, Brooke Bonito., MD, [facility] 01/18/2014, 1:41 PM

## 2014-01-18 NOTE — Progress Notes (Signed)
PCP Note: [name] I stopped by the ED to see [name] today who was admitted for chest pain r/o ACS. Patient feel better at the time I visited her, denies any concern, awaiting further testing and cardiology assessment. She has been compliant with all her home medication except for Clonidine which I recently started her on for her uncontrolled HTN. Patient feels she is getting good care from the [facility] team. I will see her upon d/c from the hospital at the clinic.

## 2014-01-18 NOTE — ED Notes (Signed)
CBG at 0516 reading 108. Pt. Not to receive any insulin coverage.

## 2014-01-18 NOTE — ED Notes (Signed)
Provider in room  

## 2014-01-18 NOTE — ED Notes (Signed)
Patient presents with c/o centralized chest pain that  Radiates to her right shoulder +nausea

## 2014-01-18 NOTE — Progress Notes (Signed)
UR completed 

## 2014-01-18 NOTE — H&P (Signed)
[facility] Admission History and Physical Service Pager: [phone]  Patient name: [name] Medical record number: [id number] Date of birth: 08/07/1941 Age: 73 y.o. Gender: female  Primary Care Provider: Andrena Mews, MD Consultants: Cardiology Rounding Team (CP Eval Unit) - Dr. Johnsie Cancel primary pt Code Status: Full  Chief Complaint: Chest Pain  Assessment and Plan: [name] is a 73 y.o. year old female presenting with right-sided atypical chest pain . PMH is significant for coronary artery disease status post stenting, hypertension, hypothyroidism, diabetes, cor pulmonale and anemia.  Chest pain: Atypical in nature and not exactly an anginal equivalent given on right side but the character is similar to her initial MI.  Most recent cardiac catheterization in 2012 with non-interventional disease.  Multiple hospitalizations since cardiac catheterization for recurrent chest pain.  She is currently chest pain-free and denying any significant changes in functional status over the past 2-3 months. - Chest pain etiology unit, cycle cardiac enzymes, risk stratify - Change her medication to atorvastatin (high potency)  HTN:  Difficult to control the past but well controlled at this time.  On multiple medications. - No changes to home regimen - Consider titration of Imdur - Should avoid pseudoephedrine-containing products, unclear as to last use  DM2: Check A1c - 1/2 home Lantus to 10Units  - Sensitive sliding scale  Hypothyroid:  - check TSH  Snoring and cor pulmonale:  - Patient should be considered for an outpatient sleep study if amenable to CPAP - To followup as outpatient  Anemia, chronic, normocytic: - Continue to monitor.  - weights stable, no evidence of malnutrition; check Albumin in AM.  Hypokalemia: s/p 40 mEq in ED.    FEN/GI: N.p.o. until evaluated by cardiology Prophylaxis: Heparin  Disposition: Chest pain evaluation  unit  History of Present Illness: [name] is a 73 y.o. year old female presenting with acute right-sided nonradiating chest pain described as a muscle cramp in her right shoulder region involving the pectoral and scapular regions.  She reports acute onset prior to bed.  She took 2 TUMS without relief, 2 nitroglycerin without significant relief.  She had persistent chest pain until seen in the emergency department and given 325 mg of aspirin and an additional dose of nitroglycerin.  Morphine was also given.  Her pain is completely resolved at this time.  She denies diaphoresis, significant dyspnea on exertion, vomiting however she did have nausea.  She reports this seems similar to her chest pain when she had an MI previously but that was on the left side this is on the right.    Last cardiac catheterization 2012 with non-interventional disease, medical therapy recommended.  Multiple hospitalizations for recurrent chest pain in the past 2 years.  Patient underwent a nuclear stress test in March of 2014 with no evidence of inducible myocardial ischemia however possible scar was found; EF of 72%  Review Of Systems: Per HPI with the following additions: Prior to tonight feeling well.  No decreased functional status, no new orthopnea, PND or lower extremity swelling.  Patient chronically sleeps on 2-3 pillows due to habit but reports ability to lay flat.  Daughter reports significant snoring and her son has been dx with OSA.  No prior evaluation for this Otherwise 12 point review of systems was performed and was unremarkable.  Patient Active Problem List   Diagnosis Date Noted  . Left hip pain 10/12/2013  . Subcutaneous nodule 10/12/2013  . Thyroid enlargement 04/12/2013  . History  of thyroid cancer 04/12/2013  . Allergic rhinitis 12/04/2011  . HYPOTHYROIDISM, UNSPECIFIED 02/19/2007  . DM (diabetes mellitus), type 2, uncontrolled 02/19/2007  . HYPERCHOLESTEROLEMIA 02/19/2007  . ANEMIA, OTHER,  UNSPECIFIED 02/19/2007  . CARPAL TUNNEL SYNDROME 02/19/2007  . HYPERTENSION, BENIGN SYSTEMIC 02/19/2007  . CORONARY, ARTERIOSCLEROSIS 02/19/2007  . COR PULMONALE 02/19/2007   Past Medical History: Past Medical History  Diagnosis Date  . History of thyroid cancer   . ETD (eustachian tube dysfunction)   . CTS (carpal tunnel syndrome)   . Anemia   . Gastritis   . Postmenopausal   . Hypothyroid   . Cor pulmonale   . Hypertension, benign   . Diabetes mellitus, type II   . Hypercholesteremia   . GERD (gastroesophageal reflux disease)   . Coronary artery disease     a. Remote PTCA of the PDA;  b. 02/2009 PCI/CBA to the D1;  c. 11/2011 Cath: LM nl, LAD 20p, 41m D1 patent, RI 20p, LCX nl, RCA 30d, EF 55-65%.  . History of papillary adenocarcinoma of thyroid 1992    right thyroid lobectomy   Past Surgical History: Past Surgical History  Procedure Laterality Date  . Angioplasty      PDA 90-40 %  . Thyroidectomy Right 10-06-1991    Hashimotos and papillary carcinoma  . Cholecystectomy  08-23-2002  . Endometrial biopsy  02-20-1998  . Bartholin gland cyst excision  09-13-2004    Keratosis- no CA  . Ptca  10/23/2000 and 03/15/09  . Cardiac catheterization  03/15/2009    stenosis of diagonal branch of LAD- PTCA performed  . Coronary angioplasty    . Tonsillectomy    . Breast biopsy      LEFT SPOT REMOVED  . Tubal ligation     Social History: History  Substance Use Topics  . Smoking status: Former Smoker    Types: Cigarettes    Quit date: 12/23/1980  . Smokeless tobacco: Former [employer] developer   Types: C[place]: 08/23/1994  . Alcohol Use: No   Additional social history:   Please also refer to relevant sections of EMR.  Family History: Family History  Problem Relation Age of Onset  . Diabetes Mother     Deceased- hypoglycemic coma   . Hypertension Mother   . Other Father     unaware of his health history - died when pt was 73yr old.   Allergies and Medications: Allergies   Allergen Reactions  . Codeine     REACTION: GI Intolerance  . Sulfamethoxazole-Trimethoprim     REACTION: Gi intolerence   Current Facility-Administered Medications on File Prior to Encounter  Medication Dose Route Frequency Provider Last Rate Last Dose  . nitroGLYCERIN (NITROSTAT) SL tablet 0.4 mg  0.4 mg Sublingual Once DRoyal Oak MD       Current Outpatient Prescriptions on File Prior to Encounter  Medication Sig Dispense Refill  . amLODipine (NORVASC) 10 MG tablet TAKE ONE TABLET BY MOUTH ONCE DAILY  90 tablet  3  . aspirin 81 MG chewable tablet Chew 81 mg by mouth daily.        . Blood Glucose Monitoring Suppl (ONE TOUCH ULTRA MINI) W/DEVICE KIT AS DIRECTED  1 each  0  . carvedilol (COREG) 25 MG tablet Take 1 tablet (25 mg total) by mouth 2 (two) times daily with a meal.  180 tablet  3  . cloNIDine (CATAPRES) 0.1 MG tablet Take 1 tablet (0.1 mg total) by mouth  2 (two) times daily.  60 tablet  3  . fluticasone (FLONASE) 50 MCG/ACT nasal spray Place 2 sprays into the nose daily as needed. For allergies  16 g  6  . hydrochlorothiazide (HYDRODIURIL) 25 MG tablet TAKE ONE TABLET BY MOUTH EVERY DAY  90 tablet  3  . insulin glargine (LANTUS) 100 UNIT/ML injection Inject 0.2 mLs (20 Units total) into the skin every morning.  30 mL  3  . Insulin Syringe-Needle U-100 (INSULIN SYRINGE 1CC/31GX5/16") 31G X 5/16" 1 ML MISC 1 application by Does not apply route 1 day or 1 dose.  100 each  2  . isosorbide mononitrate (IMDUR) 60 MG 24 hr tablet Take 1 tablet (60 mg total) by mouth daily.  90 tablet  1  . Lancets (ONETOUCH ULTRASOFT) lancets Check blood sugars tid with meals and before bed  200 each  12  . levothyroxine (SYNTHROID, LEVOTHROID) 50 MCG tablet Take 1 tablet (50 mcg total) by mouth daily.  90 tablet  3  . lisinopril (PRINIVIL,ZESTRIL) 20 MG tablet Take 2 tablets (40 mg total) by mouth daily.  180 tablet  3  . loratadine-pseudoephedrine (CLARITIN-D 24-HOUR) 10-240 MG per 24  hr tablet Take 1 tablet by mouth daily as needed. For allergies      . metFORMIN (GLUCOPHAGE-XR) 500 MG 24 hr tablet Take 4 tablets (2,000 mg total) by mouth daily with breakfast.      . nitroGLYCERIN (NITROSTAT) 0.4 MG SL tablet Place 1 tablet (0.4 mg total) under the tongue every 5 (five) minutes as needed for chest pain.  25 tablet  3  . ONE TOUCH ULTRA TEST test strip CHECK BLOOD SUGAR  IN THE MORNINGS AND IN THE EVENINGS.  100 each  4  . pravastatin (PRAVACHOL) 40 MG tablet Take 1 tablet (40 mg total) by mouth daily.  90 tablet  3   OBJECTIVE: Temp:  [97.8 F (36.6 C)] 97.8 F (36.6 C) (01/27 0043) Pulse Rate:  [66-74] 66 (01/27 0200) Resp:  [14-20] 19 (01/27 0200) BP: (103-146)/(41-57) 111/54 mmHg (01/27 0200) SpO2:  [98 %-100 %] 100 % (01/27 0200) Weight:  [161 lb 7 oz (73.228 kg)] 161 lb 7 oz (73.228 kg) (01/27 0043) Body mass index is 29.52 kg/(m^2).   BASELINE WEIGHT:  160   Filed Weights   01/18/14 0043  Weight: 161 lb 7 oz (73.228 kg)   No intake or output data in the 24 hours ending 01/18/14 0348   Physical Exam:  GENERAL:  obese African American female. In no discomfort; no respiratory distress  PSYCH: alert and appropriate, good insight   HNEENT:  no JVD, prominent bilateral carotid impulse, no hepatojugular reflex   CARDIO: RRR, S1/S2 heard, no murmur  LUNGS:  CTA B, no wheezes, no crackles  ABDOMEN:  obese, protuberant   EXTREM:  Warm, well perfused.  Moves all 4 extremities spontaneously; no lateralization. Distal pulses 1+/4.  Trace pretibial edema.  GU:   SKIN:    EKG:  1/27 - NSR, inferior lateral T-wave inversions that are unchanged from prior EKGs.  No ST elevation changes   LABS:  Basic Labs Extended:   Recent Labs Lab 01/18/14 0111 01/18/14 0120  WBC 5.9  --   HGB 10.5* 12.2  HCT 32.7* 36.0  PLT 162  --     Recent Labs Lab 01/18/14 0120  NA 142  K 3.2*  CL 98  BUN 15  CREATININE 0.90  GLUCOSE 263*   No results found for this  basename: ALBUMIN, PROTEIN, ALT, AST, ALKPHOS, BILITOT, MG, LIPASE,  in the last 168 hours  Recent Labs  03/06/13 0134 03/06/13 0135 04/12/13 1426 04/12/13 1449 10/12/13 1011  HGBA1C  --   --  9.7  --  8.1  TRIG 75  --   --   --   --   CHOL 121  --   --   --   --   HDL 44  --   --   --   --   LDLCALC 62  --   --   --   --   TSH  --  0.377  --  0.611  --   FREET4  --   --   --  1.19  --     Recent Labs Lab 01/18/14 0118  TROPIPOC 0.00      Urinalysis: Further Urine Studies:      IMAGING: 01/18/14 - 1V CXR - no acute cardiopulmonary process  MICRO: ABX: MICRO:      Prior Ischemic Workup: CATH: 11/26/11. 1. Minor nonobstructive atherosclerotic coronary disease. The previous angioplasty sites are still patent.  2. Normal left ventricular function.   Nuclear Stress: 03/07/13: No ischemia, possible myocardial scar in inferolateral wall Mild inferior wall hypokinesis LV EF 72%  No prior ECHO on file  Gerda Diss, DO [facility] Resident - PGY-3 01/18/2014 3:48 AM

## 2014-01-18 NOTE — ED Provider Notes (Signed)
CSN: [id number]     Arrival date & time 01/18/14  0041 History   First MD Initiated Contact with Patient 01/18/14 0056     Chief Complaint  Patient presents with  . Chest Pain   (Consider location/radiation/quality/duration/timing/severity/associated sxs/prior Treatment) HPI History provided by patient. Has history of coronary artery disease with MI in the past and 2 stents followed by Dr. Cedric Fishman.  Tonight at home at rest developed chest tightness radiating to her right shoulder. No associated shortness of breath. Some nausea but no vomiting. No diaphoresis. Symptoms moderate in severity. She took TUMS and nitroglycerin with minimal relief and presents here for evaluation. No cough. No fevers. No leg pain or leg swelling. Past Medical History  Diagnosis Date  . History of thyroid cancer   . ETD (eustachian tube dysfunction)   . CTS (carpal tunnel syndrome)   . Anemia   . Gastritis   . Postmenopausal   . Hypothyroid   . Cor pulmonale   . Hypertension, benign   . Diabetes mellitus, type II   . Hypercholesteremia   . GERD (gastroesophageal reflux disease)   . Coronary artery disease     a. Remote PTCA of the PDA;  b. 02/2009 PCI/CBA to the D1;  c. 11/2011 Cath: LM nl, LAD 20p, 56m D1 patent, RI 20p, LCX nl, RCA 30d, EF 55-65%.  . History of papillary adenocarcinoma of thyroid 1992    right thyroid lobectomy   Past Surgical History  Procedure Laterality Date  . Angioplasty      PDA 90-40 %  . Thyroidectomy Right 10-06-1991    Hashimotos and papillary carcinoma  . Cholecystectomy  08-23-2002  . Endometrial biopsy  02-20-1998  . Bartholin gland cyst excision  09-13-2004    Keratosis- no CA  . Ptca  10/23/2000 and 03/15/09  . Cardiac catheterization  03/15/2009    stenosis of diagonal branch of LAD- PTCA performed  . Coronary angioplasty    . Tonsillectomy    . Breast biopsy      LEFT SPOT REMOVED  . Tubal ligation     Family History  Problem Relation Age of Onset  . Diabetes  Mother     Deceased- hypoglycemic coma   . Hypertension Mother   . Other Father     unaware of his health history - died when pt was 73yr old.   History  Substance Use Topics  . Smoking status: Former Smoker    Types: Cigarettes    Quit date: 12/23/1980  . Smokeless tobacco: Former [employer] developer   Types: [place]: 08/23/1994  . Alcohol Use: No   OB History   Grav Para Term Preterm Abortions TAB SAB Ect Mult Living                 Review of Systems  Constitutional: Negative for fever and chills.  Respiratory: Negative for shortness of breath and wheezing.   Cardiovascular: Positive for chest pain.  Gastrointestinal: Negative for vomiting and abdominal pain.  Genitourinary: Negative for dysuria.  Musculoskeletal: Negative for back pain, neck pain and neck stiffness.  Skin: Negative for rash.  Neurological: Negative for headaches.  All other systems reviewed and are negative.    Allergies  Codeine and Sulfamethoxazole-trimethoprim  Home Medications   Current Outpatient Rx  Name  Route  Sig  Dispense  Refill  . amLODipine (NORVASC) 10 MG tablet      TAKE ONE TABLET BY MOUTH ONCE DAILY  90 tablet   3   . aspirin 81 MG chewable tablet   Oral   Chew 81 mg by mouth daily.           . Blood Glucose Monitoring Suppl (ONE TOUCH ULTRA MINI) W/DEVICE KIT      AS DIRECTED   1 each   0   . carvedilol (COREG) 25 MG tablet   Oral   Take 1 tablet (25 mg total) by mouth 2 (two) times daily with a meal.   180 tablet   3   . cloNIDine (CATAPRES) 0.1 MG tablet   Oral   Take 1 tablet (0.1 mg total) by mouth 2 (two) times daily.   60 tablet   3   . fluticasone (FLONASE) 50 MCG/ACT nasal spray   Nasal   Place 2 sprays into the nose daily as needed. For allergies   16 g   6   . hydrochlorothiazide (HYDRODIURIL) 25 MG tablet      TAKE ONE TABLET BY MOUTH EVERY DAY   90 tablet   3   . insulin glargine (LANTUS) 100 UNIT/ML injection   Subcutaneous   Inject  0.2 mLs (20 Units total) into the skin every morning.   30 mL   3   . Insulin Syringe-Needle U-100 (INSULIN SYRINGE 1CC/31GX5/16") 31G X 5/16" 1 ML MISC   Does not apply   1 application by Does not apply route 1 day or 1 dose.   100 each   2   . isosorbide mononitrate (IMDUR) 60 MG 24 hr tablet   Oral   Take 1 tablet (60 mg total) by mouth daily.   90 tablet   1   . Lancets (ONETOUCH ULTRASOFT) lancets      Check blood sugars tid with meals and before bed   200 each   12   . levothyroxine (SYNTHROID, LEVOTHROID) 50 MCG tablet   Oral   Take 1 tablet (50 mcg total) by mouth daily.   90 tablet   3   . lisinopril (PRINIVIL,ZESTRIL) 20 MG tablet   Oral   Take 2 tablets (40 mg total) by mouth daily.   180 tablet   3   . loratadine-pseudoephedrine (CLARITIN-D 24-HOUR) 10-240 MG per 24 hr tablet   Oral   Take 1 tablet by mouth daily as needed. For allergies         . metFORMIN (GLUCOPHAGE-XR) 500 MG 24 hr tablet   Oral   Take 4 tablets (2,000 mg total) by mouth daily with breakfast.         . nitroGLYCERIN (NITROSTAT) 0.4 MG SL tablet   Sublingual   Place 1 tablet (0.4 mg total) under the tongue every 5 (five) minutes as needed for chest pain.   25 tablet   3   . ONE TOUCH ULTRA TEST test strip      CHECK BLOOD SUGAR  IN THE MORNINGS AND IN THE EVENINGS.   100 each   4   . pravastatin (PRAVACHOL) 40 MG tablet   Oral   Take 1 tablet (40 mg total) by mouth daily.   90 tablet   3    BP 146/57  Pulse 74  Temp(Src) 97.8 F (36.6 C)  Resp 20  Wt 161 lb 7 oz (73.228 kg)  SpO2 100%  LMP 11/24/1992 Physical Exam  Constitutional: She is oriented to person, place, and time. She appears well-developed and well-nourished.  HENT:  Head: Normocephalic and atraumatic.  Eyes:  EOM are normal. Pupils are equal, round, and reactive to light.  Neck: Neck supple.  Cardiovascular: Normal rate, regular rhythm and intact distal pulses.   Pulmonary/Chest: Effort normal  and breath sounds normal. No respiratory distress. She exhibits no tenderness.  Abdominal: Soft. Bowel sounds are normal. She exhibits no distension. There is no tenderness.  Musculoskeletal: Normal range of motion. She exhibits no edema and no tenderness.  Neurological: She is alert and oriented to person, place, and time.  Skin: Skin is warm and dry.    ED Course  Procedures (including critical care time) Labs Review Labs Reviewed  CBC - Abnormal; Notable for the following:    Hemoglobin 10.5 (*)    HCT 32.7 (*)    RDW 15.6 (*)    All other components within normal limits  POCT I-STAT, CHEM 8 - Abnormal; Notable for the following:    Potassium 3.2 (*)    Glucose, Bld 263 (*)    All other components within normal limits  POCT I-STAT TROPONIN I   Imaging Review Dg Chest Portable 1 View  01/18/2014   CLINICAL DATA:  Chest pain  EXAM: PORTABLE CHEST - 1 VIEW  COMPARISON:  03/05/2013  FINDINGS: Cardiomediastinal contours within normal range. No confluent airspace opacity, pleural effusion, or pneumothorax. Superior displacement of the right distal clavicle with widening of the acromioclavicular joint, similar to prior. No acute osseous finding. Surgical clips right upper quadrant.  IMPRESSION: No radiographic evidence of an acute cardiopulmonary process.   Electronically Signed   By: Carlos Levering M.D.   On: 01/18/2014 01:58     Date: 01/18/2014  Rate: 73  Rhythm: normal sinus rhythm  QRS Axis: normal  Intervals: normal  ST/T Wave abnormalities: nonspecific ST/T changes  Conduction Disutrbances:none  Narrative Interpretation:   Old EKG Reviewed: unchanged  Aspirin. Nitroglycerin. IV morphine  3:44 AM recheck is pain-free. Discussed with Dr. Paulla Fore, plan admit MDM  Diagnosis: Chest pain  EKG unchanged from previous Labs reviewed as above negative initial troponin Pain resolved with aspirin, NTG, morphine Admit MED  Teressa Lower, MD 01/18/14 [phone]

## 2014-01-18 NOTE — H&P (Signed)
FMTS Attending Admit Note Patient seen and examined by me, reviewed resident admission note and I agree with Dr Nicolasa Ducking assessment and plan.  Patient with known CAD s/p PCI, admitted after onset of chest pain that ameliorated with sl NTG.  No ECG changes appreciated on ECG done in ED.  POC Troponin I is negative.  She is chest-pain-free this morning. At onset, she had some nausea, some radiation to RIGHT shoulder, no diaphoresis or dyspnea.  Agree with admission for r/o ACS, cycling cardiac enzymes and serial ECGs.  For evaluation by chest pain cardiology team for decision about need for further workup.  Dalbert Mayotte, MD

## 2014-01-19 ENCOUNTER — Observation Stay (HOSPITAL_COMMUNITY): Payer: Medicare PPO

## 2014-01-19 ENCOUNTER — Encounter (HOSPITAL_COMMUNITY): Payer: Self-pay | Admitting: General Practice

## 2014-01-19 DIAGNOSIS — R079 Chest pain, unspecified: Secondary | ICD-10-CM

## 2014-01-19 LAB — BASIC METABOLIC PANEL
BUN: 11 mg/dL (ref 6–23)
CALCIUM: 9.6 mg/dL (ref 8.4–10.5)
CHLORIDE: 104 meq/L (ref 96–112)
CO2: 28 meq/L (ref 19–32)
Creatinine, Ser: 0.67 mg/dL (ref 0.50–1.10)
GFR calc Af Amer: >90 mL/min (ref >90)
GFR calc non Af Amer: 86 mL/min — ABNORMAL LOW (ref >90)
GLUCOSE: 148 mg/dL — AB (ref 70–99)
Potassium: 4.7 mEq/L (ref 3.7–5.3)
SODIUM: 144 meq/L (ref 137–147)

## 2014-01-19 LAB — CBC
HCT: 34 % — ABNORMAL LOW (ref 36.0–46.0)
Hemoglobin: 11 g/dL — ABNORMAL LOW (ref 12.0–15.0)
MCH: 26.4 pg (ref 26.0–34.0)
MCHC: 32.4 g/dL (ref 30.0–36.0)
MCV: 81.5 fL (ref 78.0–100.0)
Platelets: 155 10*3/uL (ref 150–400)
RBC: 4.17 MIL/uL (ref 3.87–5.11)
RDW: 15.5 % (ref 11.5–15.5)
WBC: 5 10*3/uL (ref 4.0–10.5)

## 2014-01-19 LAB — COMPREHENSIVE METABOLIC PANEL
ALT: 13 U/L (ref 0–35)
AST: 16 U/L (ref 0–37)
Albumin: 3.5 g/dL (ref 3.5–5.2)
Alkaline Phosphatase: 69 U/L (ref 39–117)
BILIRUBIN TOTAL: 0.7 mg/dL (ref 0.3–1.2)
BUN: 11 mg/dL (ref 6–23)
CO2: 27 mEq/L (ref 19–32)
Calcium: 9.5 mg/dL (ref 8.4–10.5)
Chloride: 100 mEq/L (ref 96–112)
Creatinine, Ser: 0.68 mg/dL (ref 0.50–1.10)
GFR calc Af Amer: >90 mL/min (ref >90)
GFR calc non Af Amer: 86 mL/min — ABNORMAL LOW (ref >90)
GLUCOSE: 203 mg/dL — AB (ref 70–99)
Potassium: 3.7 mEq/L (ref 3.7–5.3)
Sodium: 140 mEq/L (ref 137–147)
TOTAL PROTEIN: 7.2 g/dL (ref 6.0–8.3)

## 2014-01-19 LAB — GLUCOSE, CAPILLARY
GLUCOSE-CAPILLARY: 131 mg/dL — AB (ref 70–99)
GLUCOSE-CAPILLARY: 141 mg/dL — AB (ref 70–99)
GLUCOSE-CAPILLARY: 222 mg/dL — AB (ref 70–99)

## 2014-01-19 MED ORDER — TECHNETIUM TC 99M SESTAMIBI GENERIC - CARDIOLITE
30.0000 | Freq: Once | INTRAVENOUS | Status: AC | PRN
  Administered 2014-01-19: 30 via INTRAVENOUS

## 2014-01-19 MED ORDER — REGADENOSON 0.4 MG/5ML IV SOLN
0.4000 mg | Freq: Once | INTRAVENOUS | Status: AC
  Administered 2014-01-19: 0.4 mg via INTRAVENOUS
  Filled 2014-01-19: qty 5

## 2014-01-19 MED ORDER — ATORVASTATIN CALCIUM 40 MG PO TABS
40.0000 mg | ORAL_TABLET | Freq: Every day | ORAL | Status: DC
  Filled 2014-01-19: qty 1

## 2014-01-19 MED ORDER — ATORVASTATIN CALCIUM 40 MG PO TABS: 40.0000 mg | ORAL_TABLET | Freq: Every day | ORAL | Status: DC

## 2014-01-19 MED ORDER — TECHNETIUM TC 99M SESTAMIBI GENERIC - CARDIOLITE
10.0000 | Freq: Once | INTRAVENOUS | Status: AC | PRN
  Administered 2014-01-19: 10 via INTRAVENOUS

## 2014-01-19 MED ORDER — REGADENOSON 0.4 MG/5ML IV SOLN
INTRAVENOUS | Status: AC
  Filled 2014-01-19: qty 5

## 2014-01-19 NOTE — Progress Notes (Signed)
Patient Name: [name] Date of Encounter: 01/19/2014   Principal Problem:   Chest pain Active Problems:   DM (diabetes mellitus), type 2, uncontrolled   HYPERTENSION, BENIGN SYSTEMIC   CORONARY, ARTERIOSCLEROSIS   HYPOTHYROIDISM, UNSPECIFIED   COR PULMONALE   SUBJECTIVE Pleasant  73 yr old AA female, sitting on side of bed watching T.V. Complains of no CP or gastric symptoms. Reports eating well and able to move freely around room without SOB, CP or N/V.   CURRENT MEDS . amLODipine  10 mg Oral Daily  . atorvastatin  40 mg Oral q1800  . carvedilol  25 mg Oral BID WC  . heparin  5,000 Units Subcutaneous Q8H  . insulin aspart  0-9 Units Subcutaneous Q4H  . insulin glargine  10 Units Subcutaneous Daily  . isosorbide mononitrate  60 mg Oral Daily  . levothyroxine  50 mcg Oral QAC breakfast  . lisinopril  40 mg Oral Daily  . regadenoson       OBJECTIVE  Filed Vitals:   01/19/14 1139 01/19/14 1144 01/19/14 1146 01/19/14 1148  BP: 145/58 173/70 155/59 148/59  Pulse: 58 76 92 86  Temp:      TempSrc:      Resp:      Weight:      SpO2:        Intake/Output Summary (Last 24 hours) at 01/19/14 1149 Last data filed at 01/18/14 1222  Gross per 24 hour  Intake      0 ml  Output      0 ml  Net      0 ml   Filed Weights   01/18/14 0043  Weight: 161 lb 7 oz (73.228 kg)   PHYSICAL EXAM  General: Pleasant, NAD. Neuro: Alert and oriented X 3. Moves all extremities spontaneously. Psych: Normal affect. HEENT:  [place], AT. Non-icteric.   Neck: Supple without JVD. Carotid upstroke  2+. Bruit on left. Lungs:  Resp regular and unlabored, CTA. Diminished bilaterally Heart: RRR no s3, s4, or murmurs. 1+ pulses bilat UE. 2+ pulses LE,  Cap refill =3 sec. Abdomen: Round,soft, non-tender, non-distended, BS + x 4.  Extremities: No clubbing, cyanosis or edema. DP/PT/Radials 2+ and equal bilaterally.  Accessory Clinical Findings  CBC  Recent Labs  01/18/14 0111 01/18/14 0120  01/19/14 0854  WBC 5.9  --  5.0  HGB 10.5* 12.2 11.0*  HCT 32.7* 36.0 34.0*  MCV 82.2  --  81.5  PLT 162  --  315   Basic Metabolic Panel  Recent Labs  01/18/14 0120 01/19/14 0854  NA 142 144  K 3.2* 4.7  CL 98 104  CO2  --  28  GLUCOSE 263* 148*  BUN 15 11  CREATININE 0.90 0.67  CALCIUM  --  9.6   Cardiac Enzymes  Recent Labs  01/18/14 0400 01/18/14 0913 01/18/14 1520  TROPONINI <0.30 <0.30 <0.30   Hemoglobin A1C  Recent Labs  01/18/14 0400  HGBA1C 8.7*   Fasting Lipid Panel  Recent Labs  01/18/14 0400  CHOL 117  HDL 45  LDLCALC 49  TRIG 113  CHOLHDL 2.6   Thyroid Function Tests  Recent Labs  01/18/14 0400  TSH 0.796   TELE  11/19/2014: NSR    ECG  01/19/2014: SB with TWI and present Q waves  Radiology/Studies  Dg Chest Portable 1 View  01/18/2014   CLINICAL DATA:  Chest pain  EXAM: PORTABLE CHEST - 1 VIEW  COMPARISON:  03/05/2013  FINDINGS: Cardiomediastinal  contours within normal range. No confluent airspace opacity, pleural effusion, or pneumothorax. Superior displacement of the right distal clavicle with widening of the acromioclavicular joint, similar to prior. No acute osseous finding. Surgical clips right upper quadrant.  IMPRESSION: No radiographic evidence of an acute cardiopulmonary process.   Electronically Signed   By: Carlos Levering M.D.   On: 01/18/2014 01:58   ASSESSMENT AND PLAN   1. CAD: Patient presented with atypical CP 01/18/2014 described as tightness 3/10 with accompanying pain to to right shoulder. Chest tightness eased with SL NTG to a 1/10 in approx 5 min.  She has a hx of CAD, HbA1c 8.7 with significant family Hx of MI. Also HX of gastritis and GERD. Currently untreated. ECG this morning unchanged from previous. Troponin - x 3. Will be undergoing Lexiscan cardiolite 11/28 to r/o ischemia.  If negative, no further cardiac w/u.       2. HTN: BP variable - 09/81 to 191'Y systolic, HR 78-29 this a.m. Currently taking  Norvasc, Imdur, Lisinopril, Coreg and Clonidine.  Follow today - meds may need adjustment based on trend.  3. Hyperlipidemia: 11/27 labs revealed Chol = 117, trigs = 113, HDL = 45, LDL = 49. Continue  Lipitor.  F/u LFT's if not done in awhile (last in computer 08/2012).  4.  Hypokalemia:  Received 40 meq yesterday.  K 4.7 today.  5.  DMII:  Per IM.  6.  L carotid bruit:  0-39% bilat stenosis by U/S last spring.  Signed, Hulen Luster, [employer] STUDENT- NP Murray Hodgkins, NP 11:50 AM 01/19/2014  I have personally seen and examined this patient with Ignacia Bayley, NP. I agree with the assessment and plan as outlined above. Chest pain has resolved. If stress myoview does not show ischemia, can d/c home and f/u with Dr. Johnsie Cancel. If stress test is abnormal, then plan cardiac cath tomorrow.   Tejal Monroy 01/19/2014 12:57 PM

## 2014-01-19 NOTE — Discharge Instructions (Signed)
You were admitted for evaluation of your chest pain, which was found not to be due to a heart attack (myocardial infarction, or MI). You are stable for discharge, and are to continue all your home medications with the following exceptions:   DO NOT continue taking clonidine until you follow up with Dr. Gwendlyn Deutscher next week.  STOP taking pravastatin and START taking atorvastatin (lipitor) in its place.  If you experience chest pain, shortness of breath, nausea, sweating, or any other worrisome signs please seek medical attention.

## 2014-01-19 NOTE — Progress Notes (Signed)
FMTS Attending Daily Note: Dorcas Mcmurray MD [phone] pager office [phone] I  have seen and examined this patient, reviewed their chart. I have discussed this patient with the resident. I agree with the resident's findings, assessment and care plan. Await myoview results. Discussion with her primary care provider revealed it patient evidently never started the clonidine. We gave her one dose yesterday. Evidently she has some aversion to taking this medicine so will stop it so she doesn't have rebound hypertension on discharge as I doubt she'll take it at home.

## 2014-01-19 NOTE — Progress Notes (Signed)
Family Medicine Teaching Service Daily Progress Note Intern Pager: [phone]  Patient name: [name] Medical record number: [id number] Date of birth: 1941-03-07 Age: 73 y.o. Gender: female  Primary Care Provider: Andrena Mews, MD Consultants: Cardiology Code Status: Full  Pt Overview and Major Events to Date:  1/27: Admitted for ACS evaluation 1/28: Lexiscan myoview  Assessment and Plan: [name] is a 73 y.o. year old female presenting with right-sided atypical chest pain. PMH is significant for CAD s/p stenting, HTN, hypothyroidism, poorly controlled T2DM, cor pulmonale and anemia.   Atypical angina with CAD s/p stenting: Resolved. Last myoview showed inferior wall hypokinesis with preserved EF (72%). Strongly + FH for MI.  - Troponins negative x3 - ECG: NSR with TWI inferior (not present in March 2014) and anteroseptal leads (not new). Repeat ECG this AM: Sinus bradycardia with TWI in III, aVF, V1-V3 - No reported telemetric events - Cardiology recommendations appreciated: For stress myoview today  HTN: Difficult to control the past but well controlled at this time. - No changes to home regimen: lisinopril 40mg , coreg 25mg  BID, norvasc 10mg , imdur 60mg   - Holding clonidine 0.1mg  BID (recently prescribed, poor compliance), can restart at f/u  - Should avoid pseudoephedrine-containing products, unclear as to last use   T2DM: Hb A1c 8.7% - 1/2 home Lantus to 10u once she is not NPO - Sensitive sliding scale: 8u given since admission  - Start lipitor 40mg  (LFTs pending)  Hypothyroidism: Continue home synthroid - TSH 0.796 Cor pulmonale:  - Recommend outpatient sleep study if amenable to CPAP  Anemia, chronic, normocytic:  - Continue to monitor.  - weights stable, no evidence of malnutrition; check albumin in AM.   Hypokalemia: Resolved (4.7) s/p 40 mEq in ED.   FEN/GI: NPO pending myoview Prophylaxis: Subcutaneous heparin  Disposition: Pending myoview  results  Subjective: Denies CP, SOB, palpitations, HA, vision changes. No events overnight. Ready to go home. Not anxious about procedure.   Objective: Temp:  [98.1 F (36.7 C)-98.8 F (37.1 C)] 98.1 F (36.7 C) (01/28 0502) Pulse Rate:  [56-83] 56 (01/28 0502) Resp:  [12-24] 18 (01/28 0502) BP: (98-158)/(46-75) 98/52 mmHg (01/28 0502) SpO2:  [94 %-100 %] 97 % (01/28 0502) Physical Exam: Gen: Well-appearing 73 y.o.female in NAD HEENT: MMM, posterior oropharynx clear Pulm: Non-labored; CTAB, no wheezes  CV: Regular rate, no murmur appreciated; distal pulses intact/symmetric GI: + BS; soft, non-tender, non-distended Skin: No rashes, wounds, ulcers Neuro: A&Ox3, CN II-XII without deficits  Laboratory:  Recent Labs Lab 01/18/14 0111 01/18/14 0120  WBC 5.9  --   HGB 10.5* 12.2  HCT 32.7* 36.0  PLT 162  --     Recent Labs Lab 01/18/14 0120  NA 142  K 3.2*  CL 98  BUN 15  CREATININE 0.90  GLUCOSE 263*   TSH: 0.796 Hb A1c: 8.7  Cholesterol 117  Triglycerides 113  HDL 45  LDL (calc) 49  VLDL 23  Total CHOL/HDL Ratio 2.6    Imaging/Diagnostic Tests: 01/18/14 - 1V CXR - no acute cardiopulmonary process  Vance Gather, MD 01/19/2014, 8:39 AM PGY-1, [facility] Intern pager: [phone], text pages welcome

## 2014-01-19 NOTE — Discharge Summary (Signed)
[facility] Discharge Summary  Patient name: [name] Medical record number: [id number] Date of birth: 1941-11-05 Age: 73 y.o. Gender: female Date of Admission: 01/18/2014  Date of Discharge: 01/19/2014 Admitting Physician: Willeen Niece, MD  Primary Care Provider: Andrena Mews, MD Consultants: Cardiology  Indication for Hospitalization: Chest pain, ACS evaluation  Discharge Diagnoses/Problem List:  Patient Active Problem List   Diagnosis Date Noted  . Chest pain 01/18/2014  . Left hip pain 10/12/2013  . Subcutaneous nodule 10/12/2013  . Thyroid enlargement 04/12/2013  . History of thyroid cancer 04/12/2013  . Allergic rhinitis 12/04/2011  . HYPOTHYROIDISM, UNSPECIFIED 02/19/2007  . DM (diabetes mellitus), type 2, uncontrolled 02/19/2007  . HYPERCHOLESTEROLEMIA 02/19/2007  . ANEMIA, OTHER, UNSPECIFIED 02/19/2007  . CARPAL TUNNEL SYNDROME 02/19/2007  . HYPERTENSION, BENIGN SYSTEMIC 02/19/2007  . CORONARY, ARTERIOSCLEROSIS 02/19/2007  . COR PULMONALE 02/19/2007   Disposition: Discharged home  Discharge Condition: Stable  Discharge Exam:  Gen: Well-appearing 73 y.o.female in NAD  Pulm: Non-labored; CTAB, no wheezes  CV: Regular rate, no murmur appreciated; distal pulses intact/symmetric   Brief Hospital Course:  [name] is a 74 year old female presenting on 01/18/2014 with atypical chest pain. PMH is significant for CAD s/p stenting, HTN, hypothyroidism, poorly controlled T2DM, cor pulmonale and anemia.   On presentation, [name] endorsed right-sided chest pain, relieved somewhat by nitroglycerin and not related to exertion. Her chest pain resolved quickly upon arrival to the emergency department. ECG at that time showed t-wave inversions in the inferior leads which was thought to be new. This was thought to be consistent with her most recent myoview, which showed inferior wall hypokinesis with preserved EF (72%). Cardiac  enzymes were negative, there were no significant telemetric events, and repeat ECG the following morning was unchanged. Cardiology was consulted and performed a [facility] which revealed no stress-induced ischemia, EF of 70%, and a stable inferolateral scar. High-intensity statin therapy (lipitor 79m) was substituted for her home statin and continued on discharge.   Her home regimen, consisting of lisinopril 427m coreg 2531mID, norvasc 81m58mnd imdur 60mg50m continued with some control of hypertension. She had recently been prescribed clonidine for augmentation of this regimen but had not yet started it, so it was not started. Her home dose of lantus (20u) was cut in half and continued with sensitive sliding scale insulin. Hemoglobin A1c was 8.7%.   Issues for Follow Up:  - Restart clonidine as indicated. - Follow-up LFTs that were pending at time of discharge. Pt escalated on statin therapy to lipitor 40mg.43mugment T2DM regimen. No changes were made on discharge. Hb A1c was 8.7 - Recommend outpatient sleep study if amenable to CPAP, with evidence of cor pulmonale.  Significant Procedures: Lexiscan myoview 1/28  Significant Labs and Imaging:   Recent Labs Lab 01/18/14 0111 01/18/14 0120 01/19/14 0854  WBC 5.9  --  5.0  HGB 10.5* 12.2 11.0*  HCT 32.7* 36.0 34.0*  PLT 162  --  155    Recent Labs Lab 01/18/14 0120 01/19/14 0854  NA 142 144  K 3.2* 4.7  CL 98 104  CO2  --  28  GLUCOSE 263* 148*  BUN 15 11  CREATININE 0.90 0.67  CALCIUM  --  9.6  NUCLEAR MEDICINE MYOCARDIAL PERFUSION IMAGING  FINDINGS:  The stress SPECT images demonstrate a moderate area of mildly  decreased activity involving the inferolateral aspect of the left  ventricle, otherwise Physiologic distribution of  radiopharmaceutical.  Rest images demonstrate Stable inferolateral  perfusion abnormality, no new perfusion defects. The gated stress  SPECT images demonstrate normal left ventricular  myocardial  thickening including inferolateral wall. No focal wall motion  abnormality is seen. Calculated left ventricular end-diastolic  volume 24QA, end-systolic volume 83MH, ejection fraction of 70%.  IMPRESSION:  1. Negative for pharmacologic-stress induced ischemia.  2. Left ventricular ejection fraction 70%.  3. Stable inferolateral scar.  Results/Tests Pending at Time of Discharge: None  Discharge Medications:    Medication List    STOP taking these medications       cloNIDine 0.1 MG tablet  Commonly known as:  CATAPRES     pravastatin 40 MG tablet  Commonly known as:  PRAVACHOL      TAKE these medications       amLODipine 10 MG tablet  Commonly known as:  NORVASC  TAKE ONE TABLET BY MOUTH ONCE DAILY     aspirin 81 MG chewable tablet  Chew 81 mg by mouth daily.     atorvastatin 40 MG tablet  Commonly known as:  LIPITOR  Take 1 tablet (40 mg total) by mouth daily at 6 PM.     carvedilol 25 MG tablet  Commonly known as:  COREG  Take 1 tablet (25 mg total) by mouth 2 (two) times daily with a meal.     fluticasone 50 MCG/ACT nasal spray  Commonly known as:  FLONASE  Place 2 sprays into the nose daily as needed. For allergies     hydrochlorothiazide 25 MG tablet  Commonly known as:  HYDRODIURIL  TAKE ONE TABLET BY MOUTH EVERY DAY     insulin glargine 100 UNIT/ML injection  Commonly known as:  LANTUS  Inject 0.2 mLs (20 Units total) into the skin every morning.     INSULIN SYRINGE 1CC/31GX5/16" 31G X 5/16" 1 ML Misc  1 application by Does not apply route 1 day or 1 dose.     isosorbide mononitrate 60 MG 24 hr tablet  Commonly known as:  IMDUR  Take 1 tablet (60 mg total) by mouth daily.     levothyroxine 50 MCG tablet  Commonly known as:  SYNTHROID, LEVOTHROID  Take 1 tablet (50 mcg total) by mouth daily.     lisinopril 20 MG tablet  Commonly known as:  PRINIVIL,ZESTRIL  Take 2 tablets (40 mg total) by mouth daily.     loratadine-pseudoephedrine  10-240 MG per 24 hr tablet  Commonly known as:  CLARITIN-D 24-hour  Take 1 tablet by mouth daily as needed. For allergies     metFORMIN 500 MG 24 hr tablet  Commonly known as:  GLUCOPHAGE-XR  Take 4 tablets (2,000 mg total) by mouth daily with breakfast.     nitroGLYCERIN 0.4 MG SL tablet  Commonly known as:  NITROSTAT  Place 1 tablet (0.4 mg total) under the tongue every 5 (five) minutes as needed for chest pain.     ONE TOUCH ULTRA MINI W/DEVICE Kit  AS DIRECTED     ONE TOUCH ULTRA TEST test strip  Generic drug:  glucose blood  CHECK BLOOD SUGAR  IN THE MORNINGS AND IN THE EVENINGS.     onetouch ultrasoft lancets  Check blood sugars tid with meals and before bed        Discharge Instructions: Please refer to Patient Instructions section of EMR for full details.  Patient was counseled important signs and symptoms that should prompt return to medical care, changes in medications, dietary instructions, activity restrictions,  and follow up appointments.   Follow-Up Appointments:     Follow-up Information   Follow up with Andrena Mews, MD On 01/25/2014. (8:30am)    Specialty:  Family Medicine   Contact information:   [place] [address] [phone]      Vance Gather, MD 01/19/2014, 12:24 PM PGY-1, [place]

## 2014-01-21 NOTE — Discharge Summary (Signed)
Family Medicine Teaching Service  Discharge Note : Attending Neeley Sedivy MD Pager [phone] Office [phone] I have seen and examined this patient, reviewed their chart and discussed discharge planning wit the resident at the time of discharge. I agree with the discharge plan as above.  

## 2014-01-25 ENCOUNTER — Ambulatory Visit (INDEPENDENT_AMBULATORY_CARE_PROVIDER_SITE_OTHER): Payer: Medicare PPO | Admitting: Family Medicine

## 2014-01-25 ENCOUNTER — Encounter: Payer: Self-pay | Admitting: Family Medicine

## 2014-01-25 VITALS — BP 129/69 | HR 77 | Temp 98.8°F | Resp 17 | Wt 156.6 lb

## 2014-01-25 DIAGNOSIS — E1165 Type 2 diabetes mellitus with hyperglycemia: Secondary | ICD-10-CM

## 2014-01-25 DIAGNOSIS — I10 Essential (primary) hypertension: Secondary | ICD-10-CM

## 2014-01-25 DIAGNOSIS — IMO0002 Reserved for concepts with insufficient information to code with codable children: Secondary | ICD-10-CM

## 2014-01-25 DIAGNOSIS — IMO0001 Reserved for inherently not codable concepts without codable children: Secondary | ICD-10-CM

## 2014-01-25 DIAGNOSIS — R079 Chest pain, unspecified: Secondary | ICD-10-CM

## 2014-01-25 NOTE — Patient Instructions (Signed)
It was nice seeing you today and am glad you feel better after the hospital discharge,however your DM is still not well controlled, I will recommend you increase your Lantus to 22 unit daily,continue to monitor your home glucose, if less than 60 do not take your insulin and give [place] a call.

## 2014-01-25 NOTE — Assessment & Plan Note (Signed)
BP well controlled on current regimen. Continue to monitor on current regimen. D/C Clonidine.

## 2014-01-25 NOTE — Assessment & Plan Note (Signed)
S/P hospital assessment for ACS. Stress test result reviewed. Has not been symptomatic since d/c from the hospital. Continue Lipitor 40 mg, ASA 81 mg and Nitroglycerin prn chest pain.

## 2014-01-25 NOTE — Assessment & Plan Note (Signed)
A1C done in the hospital increased to 8.7 from 8.1 3 months ago. Plan to increase Lantus to 22 unit qd. Continue Metformin XR 1000 mg BID Continue to monitor capillary glucose.

## 2014-01-25 NOTE — Progress Notes (Signed)
Subjective:     Patient ID: [name], female   DOB: 07-Apr-1941, 73 y.o.   MRN: [id number]  HPI Hospital F/U: here to follow up after discharge from the hospital,she was admitted for chest pain and had cardiac work up done,since d/c from the hospital she has not had any more chest pain,feels well today,she has been compliant with all her discharged medications. GM:[id number] on Lantus 20 unit qd and Metformin 2000 mg qd,home capillary glucose check ranges from 112 to 249, she is doing well with her diet and gets as much exercise as possible. Denies hypoglycemic episodes. HTN: She did not start Clonidine,currently on Lisinopril 40 mg qd, HCTZ 25 mg qd,Imdur 19m qd. and Coreg 25 mg BID.  Current Outpatient Prescriptions on File Prior to Visit  Medication Sig Dispense Refill  . amLODipine (NORVASC) 10 MG tablet TAKE ONE TABLET BY MOUTH ONCE DAILY  90 tablet  3  . aspirin 81 MG chewable tablet Chew 81 mg by mouth daily.        .Marland Kitchenatorvastatin (LIPITOR) 40 MG tablet Take 1 tablet (40 mg total) by mouth daily at 6 PM.  30 tablet  0  . Blood Glucose Monitoring Suppl (ONE TOUCH ULTRA MINI) W/DEVICE KIT AS DIRECTED  1 each  0  . carvedilol (COREG) 25 MG tablet Take 1 tablet (25 mg total) by mouth 2 (two) times daily with a meal.  180 tablet  3  . hydrochlorothiazide (HYDRODIURIL) 25 MG tablet TAKE ONE TABLET BY MOUTH EVERY DAY  90 tablet  3  . insulin glargine (LANTUS) 100 UNIT/ML injection Inject 0.2 mLs (20 Units total) into the skin every morning.  30 mL  3  . Insulin Syringe-Needle U-100 (INSULIN SYRINGE 1CC/31GX5/16") 31G X 5/16" 1 ML MISC 1 application by Does not apply route 1 day or 1 dose.  100 each  2  . isosorbide mononitrate (IMDUR) 60 MG 24 hr tablet Take 1 tablet (60 mg total) by mouth daily.  90 tablet  1  . lisinopril (PRINIVIL,ZESTRIL) 20 MG tablet Take 2 tablets (40 mg total) by mouth daily.  180 tablet  3  . metFORMIN (GLUCOPHAGE-XR) 500 MG 24 hr tablet Take 4 tablets (2,000 mg  total) by mouth daily with breakfast.      . nitroGLYCERIN (NITROSTAT) 0.4 MG SL tablet Place 1 tablet (0.4 mg total) under the tongue every 5 (five) minutes as needed for chest pain.  25 tablet  3  . ONE TOUCH ULTRA TEST test strip CHECK BLOOD SUGAR  IN THE MORNINGS AND IN THE EVENINGS.  100 each  4  . fluticasone (FLONASE) 50 MCG/ACT nasal spray Place 2 sprays into the nose daily as needed. For allergies  16 g  6  . Lancets (ONETOUCH ULTRASOFT) lancets Check blood sugars tid with meals and before bed  200 each  12  . levothyroxine (SYNTHROID, LEVOTHROID) 50 MCG tablet Take 1 tablet (50 mcg total) by mouth daily.  90 tablet  3  . loratadine-pseudoephedrine (CLARITIN-D 24-HOUR) 10-240 MG per 24 hr tablet Take 1 tablet by mouth daily as needed. For allergies       Current Facility-Administered Medications on File Prior to Visit  Medication Dose Route Frequency Provider Last Rate Last Dose  . nitroGLYCERIN (NITROSTAT) SL tablet 0.4 mg  0.4 mg Sublingual Once Dayarmys Piloto de LGwendalyn Ege MD       Past Medical History  Diagnosis Date  . History of thyroid cancer   . ETD (eustachian tube  dysfunction)   . CTS (carpal tunnel syndrome)   . Anemia   . Gastritis   . Postmenopausal   . Hypothyroid   . Cor pulmonale   . Hypertension, benign   . Diabetes mellitus, type II   . Hypercholesteremia   . GERD (gastroesophageal reflux disease)   . Coronary artery disease     a. Remote PTCA of the PDA;  b. 02/2009 PCI/CBA to the D1;  c. 11/2011 Cath: LM nl, LAD 20p, 34m D1 patent, RI 20p, LCX nl, RCA 30d, EF 55-65%.  . 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 Myocardial infarction      Review of Systems  Respiratory: Negative.   Cardiovascular: Negative.   Gastrointestinal: Negative.   Genitourinary: Negative.   All other systems reviewed and are negative.   Filed Vitals:   01/25/14 0842  BP: 129/69  Pulse: 77  Temp: 98.8 F (37.1 C)  TempSrc: Oral   Resp: 17  Weight: 156 lb 9.6 oz (71.033 kg)  SpO2: 97%       Objective:   Physical Exam  Nursing note and vitals reviewed. Constitutional: She appears well-developed. No distress.  Cardiovascular: Normal rate, regular rhythm, normal heart sounds and intact distal pulses.   No murmur heard. Pulmonary/Chest: Effort normal and breath sounds normal. No respiratory distress. She has no wheezes. She exhibits no tenderness.  Abdominal: Soft. Bowel sounds are normal. She exhibits no distension and no mass. There is no tenderness.  Musculoskeletal: Normal range of motion. She exhibits no edema.  Neurological: She is alert.       Assessment:     Chest pain: Resolved DM2: Uncontrolled HTN: Controlled     Plan:     Check problem list.

## 2014-02-04 ENCOUNTER — Other Ambulatory Visit: Payer: Self-pay | Admitting: Family Medicine

## 2014-03-21 ENCOUNTER — Other Ambulatory Visit: Payer: Self-pay | Admitting: Family Medicine

## 2014-03-28 ENCOUNTER — Other Ambulatory Visit: Payer: Self-pay | Admitting: *Deleted

## 2014-03-28 MED ORDER — ISOSORBIDE MONONITRATE ER 60 MG PO TB24: 60.0000 mg | ORAL_TABLET | Freq: Every day | ORAL | Status: DC

## 2014-03-28 MED ORDER — ATORVASTATIN CALCIUM 40 MG PO TABS: 40.0000 mg | ORAL_TABLET | Freq: Every day | ORAL | Status: DC

## 2014-04-01 ENCOUNTER — Ambulatory Visit: Payer: Medicare PPO | Admitting: Cardiovascular Disease

## 2014-04-20 ENCOUNTER — Ambulatory Visit (INDEPENDENT_AMBULATORY_CARE_PROVIDER_SITE_OTHER): Payer: Medicare PPO | Admitting: Cardiovascular Disease

## 2014-04-20 ENCOUNTER — Encounter: Payer: Self-pay | Admitting: Cardiovascular Disease

## 2014-04-20 VITALS — BP 160/62 | HR 74 | Ht 62.5 in | Wt 159.0 lb

## 2014-04-20 DIAGNOSIS — IMO0002 Reserved for concepts with insufficient information to code with codable children: Secondary | ICD-10-CM

## 2014-04-20 DIAGNOSIS — I10 Essential (primary) hypertension: Secondary | ICD-10-CM

## 2014-04-20 DIAGNOSIS — E1165 Type 2 diabetes mellitus with hyperglycemia: Secondary | ICD-10-CM

## 2014-04-20 DIAGNOSIS — IMO0001 Reserved for inherently not codable concepts without codable children: Secondary | ICD-10-CM

## 2014-04-20 DIAGNOSIS — E7800 Pure hypercholesterolemia, unspecified: Secondary | ICD-10-CM

## 2014-04-20 MED ORDER — NITROGLYCERIN 0.4 MG SL SUBL: 0.4000 mg | SUBLINGUAL_TABLET | SUBLINGUAL | Status: DC | PRN

## 2014-04-20 NOTE — Progress Notes (Signed)
Patient ID: [name], female   DOB: [birth year], 73 y.o.   MRN: [id number] [name] is seen today in followup for her hypertension hypercholesterolemia diabetes and coronary artery disease. 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 She had recurrent angina in March 2014 and had a cutting balloon therapy of her diagonal branch by Dr. Lia Foyer. Hospitalized for SSCP 1/15 with R/o  Myovue nonischemic  IMPRESSION: 1. Negative for pharmacologic-stress induced ischemia. 2. Left ventricular ejection fraction 70%. 3. Stable inferolateral scar    Needs nitro No further pain since discharge BS not well controlled cheats on diet  Still working at daughters day [employer] " [employer]"    Carotid Duplex 4/1 with 0-39% disease but abnormal thyroid appearance reviewed  Prevoius thyroidectomy She does not remember ENT    ROS: Denies fever, malais, weight loss, blurry vision, decreased visual acuity, cough, sputum, SOB, hemoptysis, pleuritic pain, palpitaitons, heartburn, abdominal pain, melena, lower extremity edema, claudication, or rash.  All other systems reviewed and negative  General: Affect appropriate Healthy:  appears stated age 73: normal Neck supple with no adenopathy JVP normal no bruits no thyromegaly Lungs clear with no wheezing and good diaphragmatic motion Heart:  S1/S2 no murmur, no rub, gallop or click PMI normal Abdomen: benighn, BS positve, no tenderness, no AAA no bruit.  No HSM or HJR Distal pulses intact with no bruits No edema Neuro non-focal Skin warm and dry No muscular weakness   Current Outpatient Prescriptions  Medication Sig Dispense Refill  . amLODipine (NORVASC) 10 MG tablet TAKE ONE TABLET BY MOUTH ONCE DAILY  90 tablet  3  . aspirin 81 MG chewable tablet Chew 81 mg by mouth daily.        [name] atorvastatin (LIPITOR) 40 MG tablet Take 1 tablet (40 mg total) by mouth daily at 6 PM.  90 tablet  1  . Blood Glucose Monitoring Suppl (ONE TOUCH ULTRA  MINI) W/DEVICE KIT AS DIRECTED  1 each  0  . carvedilol (COREG) 25 MG tablet TAKE ONE TABLET BY MOUTH TWICE DAILY WITH MEALS  180 tablet  1  . fluticasone (FLONASE) 50 MCG/ACT nasal spray Place 2 sprays into the nose daily as needed. For allergies  16 g  6  . hydrochlorothiazide (HYDRODIURIL) 25 MG tablet TAKE ONE TABLET BY MOUTH ONCE DAILY  90 tablet  1  . insulin glargine (LANTUS) 100 UNIT/ML injection Inject 0.2 mLs (20 Units total) into the skin every morning.  30 mL  3  . Insulin Syringe-Needle U-100 (INSULIN SYRINGE 1CC/31GX5/16") 31G X 5/16" 1 ML MISC 1 application by Does not apply route 1 day or 1 dose.  100 each  2  . isosorbide mononitrate (IMDUR) 60 MG 24 hr tablet Take 1 tablet (60 mg total) by mouth daily.  90 tablet  1  . Lancets (ONETOUCH ULTRASOFT) lancets Check blood sugars tid with meals and before bed  200 each  12  . levothyroxine (SYNTHROID, LEVOTHROID) 50 MCG tablet TAKE ONE TABLET BY MOUTH ONCE DAILY  90 tablet  1  . lisinopril (PRINIVIL,ZESTRIL) 20 MG tablet TAKE TWO TABLETS BY MOUTH ONCE DAILY  180 tablet  1  . loratadine-pseudoephedrine (CLARITIN-D 24-HOUR) 10-240 MG per 24 hr tablet Take 1 tablet by mouth daily as needed. For allergies      . metFORMIN (GLUCOPHAGE-XR) 500 MG 24 hr tablet TAKE FOUR TABLETS BY MOUTH EVERY DAY WITH BREAKFAST  120 tablet  1  . nitroGLYCERIN (NITROSTAT) 0.4 MG SL  tablet Place 1 tablet (0.4 mg total) under the tongue every 5 (five) minutes as needed for chest pain.  25 tablet  3  . ONE TOUCH ULTRA TEST test strip CHECK BLOOD SUGAR  IN THE MORNINGS AND IN THE EVENINGS.  100 each  4   Current Facility-Administered Medications  Medication Dose Route Frequency Provider Last Rate Last Dose  . nitroGLYCERIN (NITROSTAT) SL tablet 0.4 mg  0.4 mg Sublingual Once Dayarmys Piloto de Gwendalyn Ege, MD        Allergies  Codeine and Sulfamethoxazole-trimethoprim  Electrocardiogram:  SR rate 77 nonspecfic ST/T wave changes   Assessment and Plan

## 2014-04-20 NOTE — Assessment & Plan Note (Signed)
Stable with no angina and good activity level.  Continue medical Rx Nitro called into [facility] on [address]

## 2014-04-20 NOTE — Assessment & Plan Note (Signed)
Cholesterol is at goal.  Continue current dose of statin and diet Rx.  No myalgias or side effects.  F/U  LFT's in 6 months. Lab Results  Component Value Date   LDLCALC 49 01/18/2014

## 2014-04-20 NOTE — Assessment & Plan Note (Signed)
Discussed low carb diet.  Target hemoglobin A1c is 6.5 or less.  Continue current medications.  

## 2014-04-20 NOTE — Assessment & Plan Note (Signed)
Borderline control but on a lot of meds.  Discussed low sodium diet and she will try to monitor at home  Would like to avoid adding clonidine

## 2014-04-20 NOTE — Patient Instructions (Signed)
Your physician wants you to follow-up in:  6 MONTHS WITH DR NISHAN  You will receive a reminder letter in the mail two months in advance. If you don't receive a letter, please call our office to schedule the follow-up appointment. Your physician recommends that you continue on your current medications as directed. Please refer to the Current Medication list given to you today. 

## 2014-05-02 ENCOUNTER — Other Ambulatory Visit: Payer: Self-pay | Admitting: Family Medicine

## 2014-05-17 ENCOUNTER — Other Ambulatory Visit: Payer: Self-pay | Admitting: Family Medicine

## 2014-05-24 ENCOUNTER — Other Ambulatory Visit (INDEPENDENT_AMBULATORY_CARE_PROVIDER_SITE_OTHER): Payer: Medicare PPO

## 2014-05-24 ENCOUNTER — Other Ambulatory Visit: Payer: Self-pay | Admitting: *Deleted

## 2014-05-24 DIAGNOSIS — E039 Hypothyroidism, unspecified: Secondary | ICD-10-CM

## 2014-05-24 LAB — TSH: TSH: 0.54 u[IU]/mL (ref 0.35–4.50)

## 2014-05-24 LAB — T4, FREE: Free T4: 0.91 ng/dL (ref 0.60–1.60)

## 2014-05-26 ENCOUNTER — Ambulatory Visit (INDEPENDENT_AMBULATORY_CARE_PROVIDER_SITE_OTHER): Payer: Medicare PPO | Admitting: Endocrinology

## 2014-05-26 ENCOUNTER — Encounter: Payer: Self-pay | Admitting: Endocrinology

## 2014-05-26 VITALS — BP 148/68 | HR 80 | Temp 98.0°F | Resp 16 | Ht 62.5 in | Wt 161.6 lb

## 2014-05-26 DIAGNOSIS — Z85850 Personal history of malignant neoplasm of thyroid: Secondary | ICD-10-CM

## 2014-05-26 DIAGNOSIS — E049 Nontoxic goiter, unspecified: Secondary | ICD-10-CM

## 2014-05-26 NOTE — Progress Notes (Signed)
Patient ID: [name], female   DOB: 1941/07/26, 74 y.o.   MRN: [id number]    Reason for Appointment: Goiter, followup    History of Present Illness:   The patient's thyroid enlargement was first discovered in 1992 At that time she had a right lobectomy done for nodule that was shown to be having Hashimoto thyroiditis and also a microscopic focus of papillary carcinoma. Since then she has been on thyroid suppression She was seen in consultation last year and was felt to have a left-sided thyroid enlargement related to Hashimoto thyroiditis Her TSH level was 0.6 and she was continued on the same dose of 50 mcg She is now here for followup  She has had no difficulty with swallowing  Does not feel like she has any choking sensation in her neck or pressure  Lab Results  Component Value Date   FREET4 0.91 05/24/2014   FREET4 1.19 04/12/2013   FREET4 0.8 01/05/2010   TSH 0.54 05/24/2014   TSH 0.796 01/18/2014   TSH 0.611 04/12/2013   She has had an ultrasound exam in  4/14 which showed a heterogenous left lobe without a discrete nodule and also some remnant of the right thyroid lobe     Medication List       This list is accurate as of: 05/26/14  2:13 PM.  Always use your most recent med list.               amLODipine 10 MG tablet  Commonly known as:  NORVASC  TAKE ONE TABLET BY MOUTH ONCE DAILY     aspirin 81 MG chewable tablet  Chew 81 mg by mouth daily.     atorvastatin 40 MG tablet  Commonly known as:  LIPITOR  Take 1 tablet (40 mg total) by mouth daily at 6 PM.     carvedilol 25 MG tablet  Commonly known as:  COREG  TAKE ONE TABLET BY MOUTH TWICE DAILY WITH MEALS     fluticasone 50 MCG/ACT nasal spray  Commonly known as:  FLONASE  Place 2 sprays into the nose daily as needed. For allergies     hydrochlorothiazide 25 MG tablet  Commonly known as:  HYDRODIURIL  TAKE ONE TABLET BY MOUTH ONCE DAILY     insulin glargine 100 UNIT/ML injection  Commonly known as:   LANTUS  Inject 0.2 mLs (20 Units total) into the skin every morning.     isosorbide mononitrate 60 MG 24 hr tablet  Commonly known as:  IMDUR  Take 1 tablet (60 mg total) by mouth daily.     levothyroxine 50 MCG tablet  Commonly known as:  SYNTHROID, LEVOTHROID  TAKE ONE TABLET BY MOUTH ONCE DAILY     lisinopril 20 MG tablet  Commonly known as:  PRINIVIL,ZESTRIL  TAKE TWO TABLETS BY MOUTH ONCE DAILY     loratadine-pseudoephedrine 10-240 MG per 24 hr tablet  Commonly known as:  CLARITIN-D 24-hour  Take 1 tablet by mouth daily as needed. For allergies     metFORMIN 500 MG 24 hr tablet  Commonly known as:  GLUCOPHAGE-XR  TAKE FOUR TABLETS BY MOUTH ONCE DAILY WITH BREAKFAST     nitroGLYCERIN 0.4 MG SL tablet  Commonly known as:  NITROSTAT  Place 1 tablet (0.4 mg total) under the tongue every 5 (five) minutes as needed for chest pain.     ONE TOUCH ULTRA MINI W/DEVICE Kit  AS DIRECTED     ONE TOUCH ULTRA TEST test strip  Generic drug:  glucose blood  CHECK BLOOD SUGAR  IN THE MORNINGS AND IN THE EVENINGS.     ONETOUCH DELICA LANCETS 09N Misc  USE ONE  TO CHECK GLUCOSE THREE TIMES DAILY WITH  MEALS  AND  BEFORE  BED     RELION INSULIN SYRINGE 1ML/31G 31G X 5/16" 1 ML Misc  Generic drug:  Insulin Syringe-Needle U-100  USE ONE SYRINGE ONCE DAILY        Allergies:  Allergies  Allergen Reactions  . Codeine     REACTION: GI Intolerance  . Sulfamethoxazole-Trimethoprim     REACTION: Gi intolerence    Past Medical History  Diagnosis Date  . History of thyroid cancer   . ETD (eustachian tube dysfunction)   . CTS (carpal tunnel syndrome)   . Anemia   . Gastritis   . Postmenopausal   . Hypothyroid   . Cor pulmonale   . Hypertension, benign   . Diabetes mellitus, type II   . Hypercholesteremia   . GERD (gastroesophageal reflux disease)   . Coronary artery disease     a. Remote PTCA of the PDA;  b. 02/2009 PCI/CBA to the D1;  c. 11/2011 Cath: LM nl, LAD 20p, 42m D1  patent, RI 20p, LCX nl, RCA 30d, EF 55-65%.  . 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 Myocardial infarction     Past Surgical History  Procedure Laterality Date  . Angioplasty      PDA 90-40 %  . Thyroidectomy Right 10-06-1991    Hashimotos and papillary carcinoma  . Cholecystectomy  08-23-2002  . Endometrial biopsy  02-20-1998  . Bartholin gland cyst excision  09-13-2004    Keratosis- no CA  . Ptca  10/23/2000 and 03/15/09  . Cardiac catheterization  03/15/2009    stenosis of diagonal branch of LAD- PTCA performed  . Coronary angioplasty    . Tonsillectomy    . Breast biopsy      LEFT SPOT REMOVED  . Tubal ligation      Family History  Problem Relation Age of Onset  . Diabetes Mother     Deceased- hypoglycemic coma   . Hypertension Mother   . Other Father     unaware of his health history - died when pt was 73yr old.    Social History:  reports that she quit smoking about 33 years ago. Her smoking use included Cigarettes. She smoked 0.00 packs per day. She quit smokeless tobacco use about 19 years ago. Her smokeless tobacco use included Chew. She reports that she does not drink alcohol or use illicit drugs.   Review of Systems:  There is a  history of high blood pressure.              Has a  history of Diabetes.             Examination:   BP 148/68  Pulse 80  Temp(Src) 98 F (36.7 C)  Resp 16  Ht 5' 2.5" (1.588 m)  Wt 161 lb 9.6 oz (73.301 kg)  BMI 29.07 kg/m2  SpO2 98%  LMP 11/24/1992   General Appearance:  well built and nourished        Neck: The thyroid is enlarged on the left side and felt mostly on swallowing, it is smooth and firm and feels about 3 cm in size. Right side and isthmus are just palpable and firm without discrete nodules felt No local tenderness and  no lymphadenopathy in the neck  Biceps reflexes are normal   Assessment/Plan:  History of Hashimoto thyroiditis on previous surgery of the right  lobe Continued stable enlargement of the left lobe of the thyroid Low normal TSH  She has been on suppression since 1992, now taking only 50 mcg of levothyroxine. Most likely this has not been effective in preventing growth of the left lobe and will leave it off now  Will check her TSH again in 2 months to make sure she doesn't get hypothyroid  Elayne Snare 05/26/2014

## 2014-05-26 NOTE — Patient Instructions (Signed)
Stop Levothyroxine

## 2014-06-01 ENCOUNTER — Encounter (HOSPITAL_COMMUNITY): Payer: Self-pay | Admitting: Emergency Medicine

## 2014-06-01 ENCOUNTER — Emergency Department (HOSPITAL_COMMUNITY)
Admission: EM | Admit: 2014-06-01 | Discharge: 2014-06-01 | Disposition: A | Discharge status: Home / Self Care | Payer: Medicare PPO | Source: Home / Self Care

## 2014-06-01 DIAGNOSIS — X58XXXA Exposure to other specified factors, initial encounter: Secondary | ICD-10-CM

## 2014-06-01 DIAGNOSIS — S339XXA Sprain of unspecified parts of lumbar spine and pelvis, initial encounter: Secondary | ICD-10-CM

## 2014-06-01 DIAGNOSIS — IMO0002 Reserved for concepts with insufficient information to code with codable children: Secondary | ICD-10-CM

## 2014-06-01 DIAGNOSIS — S335XXA Sprain of ligaments of lumbar spine, initial encounter: Secondary | ICD-10-CM

## 2014-06-01 MED ORDER — TRAMADOL HCL 50 MG PO TABS: 50.0000 mg | ORAL_TABLET | Freq: Four times a day (QID) | ORAL | Status: DC | PRN

## 2014-06-01 MED ORDER — DICLOFENAC SODIUM 1 % TD GEL: TRANSDERMAL | Status: DC

## 2014-06-01 NOTE — ED Notes (Signed)
Pt  Reports  Stood  Up  Yesterday  And  Developed  Back pain         She  Denys       Any     specefic  Injury          She  denys  Any  Urinary  Symptoms                She  Ambulated  To  Room  Although  Slowly

## 2014-06-01 NOTE — ED Provider Notes (Signed)
CSN: [id number]     Arrival date & time 06/01/14  1102 History   First MD Initiated Contact with Patient 06/01/14 1325     Chief Complaint  Patient presents with  . Back Pain   (Consider location/radiation/quality/duration/timing/severity/associated sxs/prior Treatment) HPI Comments: Upon standing last PM experienced pain left low back. Pain persists with movement, forward spinal flexion and standing. No focal weakness or paresthesias. Denies spinal pain.   Past Medical History  Diagnosis Date  . History of thyroid cancer   . ETD (eustachian tube dysfunction)   . CTS (carpal tunnel syndrome)   . Anemia   . Gastritis   . Postmenopausal   . Hypothyroid   . Cor pulmonale   . Hypertension, benign   . Diabetes mellitus, type II   . Hypercholesteremia   . GERD (gastroesophageal reflux disease)   . Coronary artery disease     a. Remote PTCA of the PDA;  b. 02/2009 PCI/CBA to the D1;  c. 11/2011 Cath: LM nl, LAD 20p, 65m D1 patent, RI 20p, LCX nl, RCA 30d, EF 55-65%.  . 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 Myocardial infarction    Past Surgical History  Procedure Laterality Date  . Angioplasty      PDA 90-40 %  . Thyroidectomy Right 10-06-1991    Hashimotos and papillary carcinoma  . Cholecystectomy  08-23-2002  . Endometrial biopsy  02-20-1998  . Bartholin gland cyst excision  09-13-2004    Keratosis- no CA  . Ptca  10/23/2000 and 03/15/09  . Cardiac catheterization  03/15/2009    stenosis of diagonal branch of LAD- PTCA performed  . Coronary angioplasty    . Tonsillectomy    . Breast biopsy      LEFT SPOT REMOVED  . Tubal ligation     Family History  Problem Relation Age of Onset  . Diabetes Mother     Deceased- hypoglycemic coma   . Hypertension Mother   . Other Father     unaware of his health history - died when pt was 73yr old.   History  Substance Use Topics  . Smoking status: Former Smoker    Types: Cigarettes    Quit  date: 12/23/1980  . Smokeless tobacco: Former [employer] developer   [place] 08[place]23/1994  . Alcohol Use: No   OB History   Grav Para Term Preterm Abortions TAB SAB Ect Mult Living                 Review of Systems  Constitutional: Positive for activity change. Negative for fever and chills.  HENT: Negative.   Respiratory: Negative.  Negative for cough and shortness of breath.   Cardiovascular: Negative.  Negative for chest pain, palpitations and leg swelling.  Musculoskeletal: Positive for back pain.       As per HPI  Skin: Negative for rash.  Neurological: Negative.     Allergies  Codeine and Sulfamethoxazole-trimethoprim  Home Medications   Prior to Admission medications   Medication Sig Start Date End Date Taking? Authorizing Provider  amLODipine (NORVASC) 10 MG tablet TAKE ONE TABLET BY MOUTH ONCE DAILY 01/14/14   KAndrena Mews MD  aspirin 81 MG chewable tablet Chew 81 mg by mouth daily.      Historical Provider, MD  atorvastatin (LIPITOR) 40 MG tablet Take 1 tablet (40 mg total) by mouth daily at 6 PM. 03/28/14   KAndrena Mews MD  Blood Glucose Monitoring Suppl (ONE TOUCH ULTRA MINI) W/DEVICE KIT AS DIRECTED 10/27/12   Alveda Reasons, MD  carvedilol (COREG) 25 MG tablet TAKE ONE TABLET BY MOUTH TWICE DAILY WITH MEALS 03/21/14   Andrena Mews, MD  diclofenac sodium (VOLTAREN) 1 % GEL Apply to the area of muscle pain qid 06/01/14   Janne Napoleon, NP  fluticasone (FLONASE) 50 MCG/ACT nasal spray Place 2 sprays into the nose daily as needed. For allergies 03/12/13   Katherina Mires, MD  hydrochlorothiazide (HYDRODIURIL) 25 MG tablet TAKE ONE TABLET BY MOUTH ONCE DAILY 03/21/14   Andrena Mews, MD  insulin glargine (LANTUS) 100 UNIT/ML injection Inject 0.2 mLs (20 Units total) into the skin every morning. 10/12/13   Andrena Mews, MD  isosorbide mononitrate (IMDUR) 60 MG 24 hr tablet Take 1 tablet (60 mg total) by mouth daily. 03/28/14   Andrena Mews, MD  lisinopril  (PRINIVIL,ZESTRIL) 20 MG tablet TAKE TWO TABLETS BY MOUTH ONCE DAILY 03/21/14   Andrena Mews, MD  loratadine-pseudoephedrine (CLARITIN-D 24-HOUR) 10-240 MG per 24 hr tablet Take 1 tablet by mouth daily as needed. For allergies    Historical Provider, MD  metFORMIN (GLUCOPHAGE-XR) 500 MG 24 hr tablet TAKE FOUR TABLETS BY MOUTH ONCE DAILY WITH BREAKFAST    Andrena Mews, MD  nitroGLYCERIN (NITROSTAT) 0.4 MG SL tablet Place 1 tablet (0.4 mg total) under the tongue every 5 (five) minutes as needed for chest pain. 04/20/14   Josue Hector, MD  ONE TOUCH ULTRA TEST test strip CHECK BLOOD SUGAR  IN THE MORNINGS AND IN THE EVENINGS. 10/28/13   Andrena Mews, MD  ONETOUCH DELICA LANCETS 67R MISC USE ONE  TO CHECK GLUCOSE THREE TIMES DAILY WITH  MEALS  AND  BEFORE  BED    Andrena Mews, MD  RELION INSULIN SYRINGE 1ML/31G 31G X 5/16" 1 ML MISC USE ONE SYRINGE ONCE DAILY    Andrena Mews, MD  traMADol (ULTRAM) 50 MG tablet Take 1 tablet (50 mg total) by mouth every 6 (six) hours as needed. 06/01/14   Janne Napoleon, NP   BP 173/78  Pulse 71  Temp(Src) 97.8 F (36.6 C) (Oral)  Resp 16  SpO2 95%  LMP 11/24/1992 Physical Exam  Nursing note and vitals reviewed. Constitutional: She is oriented to person, place, and time. She appears well-developed and well-nourished. No distress.  HENT:  Head: Normocephalic and atraumatic.  Eyes: EOM are normal.  Neck: Normal range of motion. Neck supple.  Cardiovascular: Normal rate, regular rhythm and normal heart sounds.   Pulmonary/Chest: Breath sounds normal. No respiratory distress.  Musculoskeletal: She exhibits no edema.  Minimal tenderness over the L lower back musculature. With pt standing and leaning forward 15 deg elicits pain, as does the act of sitting and standing.  Neurological: She is alert and oriented to person, place, and time. No cranial nerve deficit.  Skin: Skin is warm and dry.    ED Course  Procedures (including critical care time) Labs  Review Labs Reviewed - No data to display  Imaging Review No results found.   MDM   1. Sprain or strain of lumbosacral region     Tramadol 50 mg  Diclofenac gel Heat and stretches No lifting or pulling.      Janne Napoleon, NP 06/01/14 1350

## 2014-06-01 NOTE — Discharge Instructions (Signed)

## 2014-06-01 NOTE — ED Provider Notes (Signed)
Medical screening examination/treatment/procedure(s) were performed by a resident physician or non-physician practitioner and as the supervising physician I was immediately available for consultation/collaboration.  Lynne Leader, MD    Gregor Hams, MD 06/01/14 [phone]

## 2014-07-22 ENCOUNTER — Other Ambulatory Visit (INDEPENDENT_AMBULATORY_CARE_PROVIDER_SITE_OTHER): Payer: Medicare PPO

## 2014-07-22 DIAGNOSIS — E049 Nontoxic goiter, unspecified: Secondary | ICD-10-CM

## 2014-07-22 LAB — TSH: TSH: 0.29 u[IU]/mL — AB (ref 0.35–4.50)

## 2014-07-22 LAB — T4, FREE: FREE T4: 0.83 ng/dL (ref 0.60–1.60)

## 2014-07-27 ENCOUNTER — Ambulatory Visit (INDEPENDENT_AMBULATORY_CARE_PROVIDER_SITE_OTHER): Payer: Medicare PPO | Admitting: Endocrinology

## 2014-07-27 ENCOUNTER — Encounter: Payer: Self-pay | Admitting: Endocrinology

## 2014-07-27 VITALS — BP 158/69 | HR 68 | Temp 98.3°F | Resp 16 | Ht 62.5 in | Wt 161.4 lb

## 2014-07-27 DIAGNOSIS — E041 Nontoxic single thyroid nodule: Secondary | ICD-10-CM | POA: Insufficient documentation

## 2014-07-27 NOTE — Progress Notes (Signed)
Patient ID: [name], female   DOB: 01-07-1941, 73 y.o.   MRN: [id number]    Reason for Appointment: Goiter, followup    History of Present Illness:   The patient's thyroid enlargement was first discovered in 1992 At that time she had a right lobectomy done for a nodule that was shown to be having Hashimoto thyroiditis and also a microscopic focus of papillary carcinoma. Since then she had been on thyroid suppression She was seen in consultation last year and was felt to have a left-sided thyroid enlargement related to Hashimoto thyroiditis Her TSH level was 0.54 and she was advised to try and leave off the levothyroxine and is now here for followup Does not think she has any more fatigued than usual and also had no palpitations or shakiness  She has had no difficulty with swallowing  Does not feel like she has any choking sensation in her neck or  local pressure  Lab Results  Component Value Date   FREET4 0.83 07/22/2014   FREET4 0.91 05/24/2014   FREET4 1.19 04/12/2013   TSH 0.29* 07/22/2014   TSH 0.54 05/24/2014   TSH 0.796 01/18/2014   She has had an ultrasound exam in  4/14 which showed a heterogenous left lobe without a discrete nodule and also some remnant of the right thyroid lobe     Medication List       This list is accurate as of: 07/27/14  8:53 AM.  Always use your most recent med list.               amLODipine 10 MG tablet  Commonly known as:  NORVASC  TAKE ONE TABLET BY MOUTH ONCE DAILY     aspirin 81 MG chewable tablet  Chew 81 mg by mouth daily.     atorvastatin 40 MG tablet  Commonly known as:  LIPITOR  Take 1 tablet (40 mg total) by mouth daily at 6 PM.     carvedilol 25 MG tablet  Commonly known as:  COREG  TAKE ONE TABLET BY MOUTH TWICE DAILY WITH MEALS     fluticasone 50 MCG/ACT nasal spray  Commonly known as:  FLONASE  Place 2 sprays into the nose daily as needed. For allergies     hydrochlorothiazide 25 MG tablet  Commonly known as:   HYDRODIURIL  TAKE ONE TABLET BY MOUTH ONCE DAILY     insulin glargine 100 UNIT/ML injection  Commonly known as:  LANTUS  Inject 0.2 mLs (20 Units total) into the skin every morning.     isosorbide mononitrate 60 MG 24 hr tablet  Commonly known as:  IMDUR  Take 1 tablet (60 mg total) by mouth daily.     lisinopril 20 MG tablet  Commonly known as:  PRINIVIL,ZESTRIL  TAKE TWO TABLETS BY MOUTH ONCE DAILY     loratadine-pseudoephedrine 10-240 MG per 24 hr tablet  Commonly known as:  CLARITIN-D 24-hour  Take 1 tablet by mouth daily as needed. For allergies     metFORMIN 500 MG 24 hr tablet  Commonly known as:  GLUCOPHAGE-XR  TAKE FOUR TABLETS BY MOUTH ONCE DAILY WITH BREAKFAST     nitroGLYCERIN 0.4 MG SL tablet  Commonly known as:  NITROSTAT  Place 1 tablet (0.4 mg total) under the tongue every 5 (five) minutes as needed for chest pain.     ONE TOUCH ULTRA MINI W/DEVICE Kit  AS DIRECTED     ONE TOUCH ULTRA TEST test strip  Generic drug:  glucose blood  CHECK BLOOD SUGAR  IN THE MORNINGS AND IN THE EVENINGS.     ONETOUCH DELICA LANCETS 14G Misc  USE ONE  TO CHECK GLUCOSE THREE TIMES DAILY WITH  MEALS  AND  BEFORE  BED     RELION INSULIN SYRINGE 1ML/31G 31G X 5/16" 1 ML Misc  Generic drug:  Insulin Syringe-Needle U-100  USE ONE SYRINGE ONCE DAILY     traMADol 50 MG tablet  Commonly known as:  ULTRAM  Take 1 tablet (50 mg total) by mouth every 6 (six) hours as needed.        Allergies:  Allergies  Allergen Reactions  . Codeine     REACTION: GI Intolerance  . Sulfamethoxazole-Trimethoprim     REACTION: Gi intolerence    Past Medical History  Diagnosis Date  . History of thyroid cancer   . ETD (eustachian tube dysfunction)   . CTS (carpal tunnel syndrome)   . Anemia   . Gastritis   . Postmenopausal   . Hypothyroid   . Cor pulmonale   . Hypertension, benign   . Diabetes mellitus, type II   . Hypercholesteremia   . GERD (gastroesophageal reflux disease)   .  Coronary artery disease     a. Remote PTCA of the PDA;  b. 02/2009 PCI/CBA to the D1;  c. 11/2011 Cath: LM nl, LAD 20p, 4m D1 patent, RI 20p, LCX nl, RCA 30d, EF 55-65%.  . 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 Myocardial infarction     Past Surgical History  Procedure Laterality Date  . Angioplasty      PDA 90-40 %  . Thyroidectomy Right 10-06-1991    Hashimotos and papillary carcinoma  . Cholecystectomy  08-23-2002  . Endometrial biopsy  02-20-1998  . Bartholin gland cyst excision  09-13-2004    Keratosis- no CA  . Ptca  10/23/2000 and 03/15/09  . Cardiac catheterization  03/15/2009    stenosis of diagonal branch of LAD- PTCA performed  . Coronary angioplasty    . Tonsillectomy    . Breast biopsy      LEFT SPOT REMOVED  . Tubal ligation      Family History  Problem Relation Age of Onset  . Diabetes Mother     Deceased- hypoglycemic coma   . Hypertension Mother   . Other Father     unaware of his health history - died when pt was 73yr old.    Social History:  reports that she quit smoking about 33 years ago. Her smoking use included Cigarettes. She smoked 0.00 packs per day. She quit smokeless tobacco use about 19 years ago. Her smokeless tobacco use included Chew. She reports that she does not drink alcohol or use illicit drugs.   Review of Systems:  There is a  history of high blood pressure.              Has a  history of Diabetes.  Sugarat home about 135-140. Does not know what her A1c is            Examination:   BP 158/69  Pulse 68  Temp(Src) 98.3 F (36.8 C)  Resp 16  Ht 5' 2.5" (1.588 m)  Wt 161 lb 6.4 oz (73.211 kg)  BMI 29.03 kg/m2  SpO2 98%  LMP 11/24/1992        Neck: The thyroid is enlarged on the left side and felt mostly on swallowing, it  is smooth and firm and is  about 3 cm in size. Right side and isthmus are just palpable and firm without discrete nodules felt No lymphadenopathy in the neck  Biceps  reflexes are normal   Assessment/Plan:  History of Hashimoto thyroiditis on previous surgery of the right lobe Continued stable enlargement of the left lobe of the thyroid  She has a mildly low TSH now even with stopping her levothyroxine supplement. Free T4 is unchanged   Will check her TSH again in  6 months and discuss possibility of evaluation and treatment for hyperthyroidism in the future if our level continues to go up   [facility] 07/27/2014

## 2014-08-24 ENCOUNTER — Encounter (HOSPITAL_COMMUNITY): Payer: Self-pay | Admitting: Emergency Medicine

## 2014-08-24 ENCOUNTER — Emergency Department (HOSPITAL_COMMUNITY): Payer: Medicare PPO

## 2014-08-24 ENCOUNTER — Emergency Department (HOSPITAL_COMMUNITY)
Admission: EM | Admit: 2014-08-24 | Discharge: 2014-08-25 | Disposition: A | Discharge status: Home / Self Care | Payer: Medicare PPO | Attending: Emergency Medicine | Admitting: Emergency Medicine

## 2014-08-24 DIAGNOSIS — I2510 Atherosclerotic heart disease of native coronary artery without angina pectoris: Secondary | ICD-10-CM | POA: Diagnosis not present

## 2014-08-24 DIAGNOSIS — Z87891 Personal history of nicotine dependence: Secondary | ICD-10-CM | POA: Diagnosis not present

## 2014-08-24 DIAGNOSIS — Z862 Personal history of diseases of the blood and blood-forming organs and certain disorders involving the immune mechanism: Secondary | ICD-10-CM | POA: Insufficient documentation

## 2014-08-24 DIAGNOSIS — Z9889 Other specified postprocedural states: Secondary | ICD-10-CM | POA: Diagnosis not present

## 2014-08-24 DIAGNOSIS — R079 Chest pain, unspecified: Secondary | ICD-10-CM

## 2014-08-24 DIAGNOSIS — I10 Essential (primary) hypertension: Secondary | ICD-10-CM | POA: Diagnosis not present

## 2014-08-24 DIAGNOSIS — I252 Old myocardial infarction: Secondary | ICD-10-CM | POA: Insufficient documentation

## 2014-08-24 DIAGNOSIS — E119 Type 2 diabetes mellitus without complications: Secondary | ICD-10-CM | POA: Diagnosis not present

## 2014-08-24 DIAGNOSIS — Z8669 Personal history of other diseases of the nervous system and sense organs: Secondary | ICD-10-CM | POA: Insufficient documentation

## 2014-08-24 DIAGNOSIS — Z7982 Long term (current) use of aspirin: Secondary | ICD-10-CM | POA: Diagnosis not present

## 2014-08-24 DIAGNOSIS — E7800 Pure hypercholesterolemia, unspecified: Secondary | ICD-10-CM | POA: Diagnosis not present

## 2014-08-24 DIAGNOSIS — Z9861 Coronary angioplasty status: Secondary | ICD-10-CM | POA: Diagnosis not present

## 2014-08-24 DIAGNOSIS — Z85850 Personal history of malignant neoplasm of thyroid: Secondary | ICD-10-CM | POA: Insufficient documentation

## 2014-08-24 DIAGNOSIS — Z79899 Other long term (current) drug therapy: Secondary | ICD-10-CM | POA: Diagnosis not present

## 2014-08-24 DIAGNOSIS — Z794 Long term (current) use of insulin: Secondary | ICD-10-CM | POA: Diagnosis not present

## 2014-08-24 DIAGNOSIS — K219 Gastro-esophageal reflux disease without esophagitis: Secondary | ICD-10-CM | POA: Insufficient documentation

## 2014-08-24 LAB — CBC
HCT: 32.6 % — ABNORMAL LOW (ref 36.0–46.0)
Hemoglobin: 10.4 g/dL — ABNORMAL LOW (ref 12.0–15.0)
MCH: 26.3 pg (ref 26.0–34.0)
MCHC: 31.9 g/dL (ref 30.0–36.0)
MCV: 82.5 fL (ref 78.0–100.0)
Platelets: 148 10*3/uL — ABNORMAL LOW (ref 150–400)
RBC: 3.95 MIL/uL (ref 3.87–5.11)
RDW: 15.1 % (ref 11.5–15.5)
WBC: 6 10*3/uL (ref 4.0–10.5)

## 2014-08-24 LAB — BASIC METABOLIC PANEL
Anion gap: 10 (ref 5–15)
BUN: 16 mg/dL (ref 6–23)
CO2: 30 mEq/L (ref 19–32)
Calcium: 10.1 mg/dL (ref 8.4–10.5)
Chloride: 98 mEq/L (ref 96–112)
Creatinine, Ser: 0.84 mg/dL (ref 0.50–1.10)
GFR calc Af Amer: 79 mL/min — ABNORMAL LOW (ref >90)
GFR calc non Af Amer: 68 mL/min — ABNORMAL LOW (ref >90)
Glucose, Bld: 216 mg/dL — ABNORMAL HIGH (ref 70–99)
Potassium: 4.2 mEq/L (ref 3.7–5.3)
Sodium: 138 mEq/L (ref 137–147)

## 2014-08-24 LAB — I-STAT TROPONIN, ED: Troponin i, poc: 0 ng/mL (ref 0.00–0.08)

## 2014-08-24 NOTE — ED Notes (Signed)
Pt in c/o chest tightness that was relieved by nitro SL at home, pt denies pain at this time, states during pain she had nausea and generalized weakness, pt alert and oriented

## 2014-08-25 LAB — TROPONIN I: Troponin I: <0.3 ng/mL (ref <0.30)

## 2014-08-25 MED ORDER — OMEPRAZOLE 20 MG PO CPDR: 20.0000 mg | DELAYED_RELEASE_CAPSULE | Freq: Every day | ORAL | Status: DC

## 2014-08-25 NOTE — Discharge Instructions (Signed)

## 2014-09-01 NOTE — ED Provider Notes (Signed)
CSN: [id number]     Arrival date & time 08/24/14  2250 History   First MD Initiated Contact with Patient 08/25/14 0010     Chief Complaint  Patient presents with  . Chest Pain     (Consider location/radiation/quality/duration/timing/severity/associated sxs/prior Treatment) HPI  73 year old female with chest pain. She describes a sensation of chest tightness. She cannot remember what she was specifically doing when she first noticed it. Currently resolved. Onset was earlier this evening. Symptoms lasted several hours it for the consult. Associated with feeling of being generally weak. No shortness of breath. No dizziness or lightheadedness. She did not notice that symptoms were simply change with ambulation or other exertion. No new cough. No unusual leg pain or swelling. She does have a history of known CAD.   Past Medical History  Diagnosis Date  . History of thyroid cancer   . ETD (eustachian tube dysfunction)   . CTS (carpal tunnel syndrome)   . Anemia   . Gastritis   . Postmenopausal   . Hypothyroid   . Cor pulmonale   . Hypertension, benign   . Diabetes mellitus, type II   . Hypercholesteremia   . GERD (gastroesophageal reflux disease)   . Coronary artery disease     a. Remote PTCA of the PDA;  b. 02/2009 PCI/CBA to the D1;  c. 11/2011 Cath: LM nl, LAD 20p, 29m, D1 patent, RI 20p, LCX nl, RCA 30d, EF 55-65%.  . 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 Myocardial infarction    Past Surgical History  Procedure Laterality Date  . Angioplasty      PDA 90-40 %  . Thyroidectomy Right 10-06-1991    Hashimotos and papillary carcinoma  . Cholecystectomy  08-23-2002  . Endometrial biopsy  02-20-1998  . Bartholin gland cyst excision  09-13-2004    Keratosis- no CA  . Ptca  10/23/2000 and 03/15/09  . Cardiac catheterization  03/15/2009    stenosis of diagonal branch of LAD- PTCA performed  . Coronary angioplasty    . Tonsillectomy    . Breast  biopsy      LEFT SPOT REMOVED  . Tubal ligation     Family History  Problem Relation Age of Onset  . Diabetes Mother     Deceased- hypoglycemic coma   . Hypertension Mother   . Other Father     unaware of his health history - died when pt was 73 yr old.   History  Substance Use Topics  . Smoking status: Former Smoker    Types: Cigarettes    Quit date: 12/23/1980  . Smokeless tobacco: Former Systems developer    Types: [place] date: 08/23/1994  . Alcohol Use: No   OB History   Grav Para Term Preterm Abortions TAB SAB Ect Mult Living                 Review of Systems  All systems reviewed and negative, other than as noted in HPI.   Allergies  Codeine; Sulfamethoxazole-trimethoprim; and Tramadol  Home Medications   Prior to Admission medications   Medication Sig Start Date End Date Taking? Authorizing Provider  amLODipine (NORVASC) 10 MG tablet Take 10 mg by mouth daily.   Yes Historical Provider, MD  aspirin 81 MG chewable tablet Chew 81 mg by mouth daily.     Yes Historical Provider, MD  carvedilol (COREG) 25 MG tablet Take 25 mg by mouth 2 (two) times  daily with a meal.   Yes Historical Provider, MD  fluticasone (FLONASE) 50 MCG/ACT nasal spray Place 2 sprays into the nose daily as needed. For allergies 03/12/13  Yes Katherina Mires, MD  hydrochlorothiazide (HYDRODIURIL) 25 MG tablet Take 25 mg by mouth daily.   Yes Historical Provider, MD  ibuprofen (ADVIL,MOTRIN) 200 MG tablet Take 200 mg by mouth every 6 (six) hours as needed for moderate pain.   Yes Historical Provider, MD  insulin glargine (LANTUS) 100 UNIT/ML injection Inject 0.2 mLs (20 Units total) into the skin every morning. 10/12/13  Yes Andrena Mews, MD  isosorbide mononitrate (IMDUR) 60 MG 24 hr tablet Take 1 tablet (60 mg total) by mouth daily. 03/28/14  Yes Andrena Mews, MD  lisinopril (PRINIVIL,ZESTRIL) 20 MG tablet Take 20 mg by mouth 2 (two) times daily.   Yes Historical Provider, MD  loratadine-pseudoephedrine  (CLARITIN-D 24-HOUR) 10-240 MG per 24 hr tablet Take 1 tablet by mouth daily as needed. For allergies   Yes Historical Provider, MD  metFORMIN (GLUCOPHAGE-XR) 500 MG 24 hr tablet Take 1,000 mg by mouth 2 (two) times daily.   Yes Historical Provider, MD  nitroGLYCERIN (NITROSTAT) 0.4 MG SL tablet Place 1 tablet (0.4 mg total) under the tongue every 5 (five) minutes as needed for chest pain. 04/20/14  Yes Josue Hector, MD  PRESCRIPTION MEDICATION Take 1 tablet by mouth daily. cholesterol medication   Yes Historical Provider, MD  omeprazole (PRILOSEC) 20 MG capsule Take 1 capsule (20 mg total) by mouth daily. 08/25/14   Virgel Manifold, MD   BP 108/47  Pulse 65  Temp(Src) 98 F (36.7 C) (Oral)  Resp 22  SpO2 99%  LMP 11/24/1992 Physical Exam  Nursing note and vitals reviewed. Constitutional: She appears well-developed and well-nourished. No distress.  HENT:  Head: Normocephalic and atraumatic.  Eyes: Conjunctivae are normal. Right eye exhibits no discharge. Left eye exhibits no discharge.  Neck: Neck supple.  Cardiovascular: Normal rate, regular rhythm and normal heart sounds.  Exam reveals no gallop and no friction rub.   No murmur heard. Pulmonary/Chest: Effort normal and breath sounds normal. No respiratory distress. She exhibits tenderness.  Abdominal: Soft. She exhibits no distension. There is no tenderness.  Musculoskeletal: She exhibits no edema and no tenderness.  Lower extremities symmetric as compared to each other. No calf tenderness. Negative Homan's. No palpable cords.   Neurological: She is alert.  Skin: Skin is warm and dry.  Psychiatric: She has a normal mood and affect. Her behavior is normal. Thought content normal.    ED Course  Procedures (including critical care time) Labs Review Labs Reviewed  CBC - Abnormal; Notable for the following:    Hemoglobin 10.4 (*)    HCT 32.6 (*)    Platelets 148 (*)    All other components within normal limits  BASIC METABOLIC  PANEL - Abnormal; Notable for the following:    Glucose, Bld 216 (*)    GFR calc non Af Amer 68 (*)    GFR calc Af Amer 79 (*)    All other components within normal limits  TROPONIN I  I-STAT TROPOININ, ED    Imaging Review No results found.  Dg Chest 2 View  08/24/2014   CLINICAL DATA:  CHEST PAIN  EXAM: CHEST  2 VIEW  COMPARISON:  01/18/2014, 03/05/2013, 11/25/2011  FINDINGS: The heart size and mediastinal contours are within normal limits. Both lungs are clear. The visualized skeletal structures are unchanged. Chronic dislocated right acromioclavicular joint  with superior position of the right clavicle. Right neck/paratracheal surgical clips. Right upper quadrant surgical clips.  IMPRESSION: No radiographic evidence of active cardiopulmonary disease.   Electronically Signed   By: Carlos Levering M.D.   On: 08/24/2014 23:48    EKG Interpretation   Date/Time:  Wednesday August 24 2014 22:57:37 EDT Ventricular Rate:  71 PR Interval:  162 QRS Duration: 76 QT Interval:  364 QTC Calculation: 395 R Axis:   7 Text Interpretation:  Normal sinus rhythm T wave abnormality TWI v3-6  changed from 12/2103 but noted on EKGs prior  to this Abnormal ECG  Confirmed by Lamarr Feenstra  MD, Azaryah Oleksy ([id number]) on 08/24/2014 11:04:41 PM      MDM   Final diagnoses:  Chest pain, unspecified chest pain type    73 year old female chest pain. Low suspicion for ACS. EKG is not normal, but not acutely changed either. Troponin normal x2 with second set drawn more than 6 hours after symptom onset. Currently no complaints. Workup fairly unremarkable. Low suspicion for other potential emergent etiology such as pulmonary embolism, dissection, serious infection, etc. Return precautions were discussed. Outpatient followup otherwise.    Virgel Manifold, MD 09/01/14 [phone]

## 2014-09-02 ENCOUNTER — Other Ambulatory Visit: Payer: Self-pay | Admitting: Family Medicine

## 2014-10-07 ENCOUNTER — Other Ambulatory Visit: Payer: Self-pay | Admitting: *Deleted

## 2014-10-07 MED ORDER — ISOSORBIDE MONONITRATE ER 60 MG PO TB24: 60.0000 mg | ORAL_TABLET | Freq: Every day | ORAL | Status: DC

## 2014-10-13 ENCOUNTER — Other Ambulatory Visit: Payer: Self-pay

## 2014-10-13 DIAGNOSIS — Z1231 Encounter for screening mammogram for malignant neoplasm of breast: Secondary | ICD-10-CM

## 2014-10-14 ENCOUNTER — Ambulatory Visit (INDEPENDENT_AMBULATORY_CARE_PROVIDER_SITE_OTHER): Payer: Medicare PPO | Admitting: *Deleted

## 2014-10-14 DIAGNOSIS — Z23 Encounter for immunization: Secondary | ICD-10-CM

## 2014-11-01 ENCOUNTER — Other Ambulatory Visit: Payer: Self-pay | Admitting: *Deleted

## 2014-11-01 MED ORDER — METFORMIN HCL ER 500 MG PO TB24: 1000.0000 mg | ORAL_TABLET | Freq: Two times a day (BID) | ORAL | Status: DC

## 2014-11-02 ENCOUNTER — Ambulatory Visit
Admission: RE | Admit: 2014-11-02 | Discharge: 2014-11-02 | Disposition: A | Discharge status: Home / Self Care | Payer: Medicare PPO | Source: Ambulatory Visit

## 2014-11-02 DIAGNOSIS — Z1231 Encounter for screening mammogram for malignant neoplasm of breast: Secondary | ICD-10-CM

## 2014-11-28 ENCOUNTER — Other Ambulatory Visit: Payer: Self-pay | Admitting: Family Medicine

## 2014-11-30 ENCOUNTER — Encounter (HOSPITAL_COMMUNITY): Payer: Self-pay | Admitting: Cardiology

## 2014-12-26 ENCOUNTER — Other Ambulatory Visit: Payer: Self-pay | Admitting: *Deleted

## 2014-12-26 MED ORDER — ATORVASTATIN CALCIUM 40 MG PO TABS: 40.0000 mg | ORAL_TABLET | Freq: Every day | ORAL | Status: DC

## 2014-12-27 ENCOUNTER — Ambulatory Visit (INDEPENDENT_AMBULATORY_CARE_PROVIDER_SITE_OTHER): Payer: Medicare PPO | Admitting: Family Medicine

## 2014-12-27 ENCOUNTER — Encounter: Payer: Self-pay | Admitting: Family Medicine

## 2014-12-27 VITALS — BP 131/74 | HR 78 | Temp 98.0°F | Ht 63.0 in | Wt 159.0 lb

## 2014-12-27 DIAGNOSIS — E118 Type 2 diabetes mellitus with unspecified complications: Secondary | ICD-10-CM

## 2014-12-27 DIAGNOSIS — S99922A Unspecified injury of left foot, initial encounter: Secondary | ICD-10-CM

## 2014-12-27 DIAGNOSIS — D649 Anemia, unspecified: Secondary | ICD-10-CM

## 2014-12-27 DIAGNOSIS — K589 Irritable bowel syndrome without diarrhea: Secondary | ICD-10-CM

## 2014-12-27 DIAGNOSIS — I10 Essential (primary) hypertension: Secondary | ICD-10-CM

## 2014-12-27 DIAGNOSIS — E1165 Type 2 diabetes mellitus with hyperglycemia: Secondary | ICD-10-CM

## 2014-12-27 DIAGNOSIS — IMO0002 Reserved for concepts with insufficient information to code with codable children: Secondary | ICD-10-CM

## 2014-12-27 DIAGNOSIS — S99929A Unspecified injury of unspecified foot, initial encounter: Secondary | ICD-10-CM | POA: Insufficient documentation

## 2014-12-27 HISTORY — DX: Irritable bowel syndrome, unspecified: K58.9

## 2014-12-27 LAB — CBC WITH DIFFERENTIAL/PLATELET
Basophils Absolute: 0 10*3/uL (ref 0.0–0.1)
Basophils Relative: 0 % (ref 0–1)
EOS ABS: 0.1 10*3/uL (ref 0.0–0.7)
Eosinophils Relative: 1 % (ref 0–5)
HCT: 31.8 % — ABNORMAL LOW (ref 36.0–46.0)
HEMOGLOBIN: 10.2 g/dL — AB (ref 12.0–15.0)
LYMPHS ABS: 2.4 10*3/uL (ref 0.7–4.0)
LYMPHS PCT: 39 % (ref 12–46)
MCH: 25.9 pg — ABNORMAL LOW (ref 26.0–34.0)
MCHC: 32.1 g/dL (ref 30.0–36.0)
MCV: 80.7 fL (ref 78.0–100.0)
MPV: 10.9 fL (ref 8.6–12.4)
Monocytes Absolute: 0.7 10*3/uL (ref 0.1–1.0)
Monocytes Relative: 12 % (ref 3–12)
NEUTROS PCT: 48 % (ref 43–77)
Neutro Abs: 2.9 10*3/uL (ref 1.7–7.7)
Platelets: 185 10*3/uL (ref 150–400)
RBC: 3.94 MIL/uL (ref 3.87–5.11)
RDW: 15.8 % — ABNORMAL HIGH (ref 11.5–15.5)
WBC: 6.1 10*3/uL (ref 4.0–10.5)

## 2014-12-27 LAB — POCT GLYCOSYLATED HEMOGLOBIN (HGB A1C): Hemoglobin A1C: 7.8

## 2014-12-27 MED ORDER — DICYCLOMINE HCL 10 MG PO CAPS: 10.0000 mg | ORAL_CAPSULE | Freq: Three times a day (TID) | ORAL | Status: DC

## 2014-12-27 NOTE — Assessment & Plan Note (Signed)
I recommended high fiber diet. Trial of bentyl also recommended. Reassess at next visit.

## 2014-12-27 NOTE — Progress Notes (Signed)
Subjective:     Patient ID: [name], female   DOB: May 20, 1941, 74 y.o.   MRN: [id number]  HPI  HTN/DM2:Here for follow up, currently compliant with Norvasc 10 mg qd, Coreg 25 mg BID,HCTZ 25 mg qd, Imdur 60 mg qd for HTN and Lantus 20 unit qd for DM. Bowel problem: Concern about problem with having diarrhea with eating certain food and then later she might get constipated, this has been going on for years now worsening. She stated she discussed this with the GI and was informed that this is likely lactose intolerance. Denies blood in stool, no major weight change she is aware of. Foot pain: Had trauma to her left foot  2 wks ago after a chair fell on it, but since then pain has improved. No difficulty with ambulation,swelling has gone done.Just wanted to mention it.  Current Outpatient Prescriptions on File Prior to Visit  Medication Sig Dispense Refill  . amLODipine (NORVASC) 10 MG tablet Take 10 mg by mouth daily.    [name] aspirin 81 MG chewable tablet Chew 81 mg by mouth daily.      [name] atorvastatin (LIPITOR) 40 MG tablet Take 1 tablet (40 mg total) by mouth daily at 6 PM. 90 tablet 1  . carvedilol (COREG) 25 MG tablet Take 25 mg by mouth 2 (two) times daily with a meal.    . hydrochlorothiazide (HYDRODIURIL) 25 MG tablet TAKE ONE TABLET BY MOUTH ONCE DAILY 90 tablet 1  . isosorbide mononitrate (IMDUR) 60 MG 24 hr tablet Take 1 tablet (60 mg total) by mouth daily. 90 tablet 1  . LANTUS 100 UNIT/ML injection INJECT 20 UNITS SUBCUTANEOUSLY ONCE DAILY IN THE MORNING 10 mL 5  . lisinopril (PRINIVIL,ZESTRIL) 20 MG tablet Take 20 mg by mouth 2 (two) times daily.    . metFORMIN (GLUCOPHAGE-XR) 500 MG 24 hr tablet Take 2 tablets (1,000 mg total) by mouth 2 (two) times daily. 60 tablet 4  . fluticasone (FLONASE) 50 MCG/ACT nasal spray Place 2 sprays into the nose daily as needed. For allergies (Patient not taking: Reported on 12/27/2014) 16 g 6  . ibuprofen (ADVIL,MOTRIN) 200 MG tablet Take 200 mg by  mouth every 6 (six) hours as needed for moderate pain.    [name] loratadine-pseudoephedrine (CLARITIN-D 24-HOUR) 10-240 MG per 24 hr tablet Take 1 tablet by mouth daily as needed. For allergies    . nitroGLYCERIN (NITROSTAT) 0.4 MG SL tablet Place 1 tablet (0.4 mg total) under the tongue every 5 (five) minutes as needed for chest pain. (Patient not taking: Reported on 12/27/2014) 25 tablet 3  . omeprazole (PRILOSEC) 20 MG capsule Take 1 capsule (20 mg total) by mouth daily. (Patient not taking: Reported on 12/27/2014) 30 capsule 0  . PRESCRIPTION MEDICATION Take 1 tablet by mouth daily. cholesterol medication     No current facility-administered medications on file prior to visit.   Past Medical History  Diagnosis Date  . History of thyroid cancer   . ETD (eustachian tube dysfunction)   . CTS (carpal tunnel syndrome)   . Anemia   . Gastritis   . Postmenopausal   . Hypothyroid   . Cor pulmonale   . Hypertension, benign   . Diabetes mellitus, type II   . Hypercholesteremia   . GERD (gastroesophageal reflux disease)   . Coronary artery disease     a. Remote PTCA of the PDA;  b. 02/2009 PCI/CBA to the D1;  c. 11/2011 Cath: LM nl, LAD 20p, 14m, D1  patent, RI 20p, LCX nl, RCA 30d, EF 55-65%.  . 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 Myocardial infarction      Review of Systems  Respiratory: Negative.   Cardiovascular: Negative.   Gastrointestinal: Negative.   Genitourinary: Negative.   All other systems reviewed and are negative.  Filed Vitals:   12/27/14 1030  BP: 131/74  Pulse: 78  Temp: 98 F (36.7 C)  TempSrc: Oral  Height: 5\' 3"  (1.6 m)  Weight: 159 lb (72.122 kg)       Objective:   Physical Exam  Constitutional: She is oriented to person, place, and time. She appears well-developed. No distress.  Cardiovascular: Normal rate, regular rhythm, normal heart sounds and intact distal pulses.   No murmur heard. Pulmonary/Chest: Effort  normal and breath sounds normal. No respiratory distress. She has no wheezes.  Abdominal: Soft. Bowel sounds are normal. She exhibits no distension and no mass. There is no tenderness.  Musculoskeletal: Normal range of motion. She exhibits no edema or tenderness.       Right ankle: Normal.       Left ankle: Normal.       Right foot: Normal.       Left foot: Normal.       Feet:  Neurological: She is alert and oriented to person, place, and time. She has normal reflexes. No cranial nerve deficit.  Nursing note and vitals reviewed.      Assessment:     HTN DM2 Bowel issue: Irritable Bowel.  Foot injury.    Plan:     Check problem list.

## 2014-12-27 NOTE — Assessment & Plan Note (Signed)
Rapidly improving. Monitor for further improvement of her pain.

## 2014-12-27 NOTE — Assessment & Plan Note (Signed)
BP looks good, continue current regimen. 

## 2014-12-27 NOTE — Assessment & Plan Note (Signed)
A1C improved from last visit. Will not increase dose of Lantus for now, continue 20 unit qhs. I recommended improving on her diet which she stated she had not been doing well on. Exercise also recommended. I will reassess her in 3 months.

## 2014-12-27 NOTE — Patient Instructions (Signed)
Diet and Irritable Bowel Syndrome  No cure has been found for irritable bowel syndrome (IBS). Many options are available to treat the symptoms. Your caregiver will give you the best treatments available for your symptoms. He or she will also encourage you to manage stress and to make changes to your diet. You need to work with your caregiver and Registered Dietician to find the best combination of medicine, diet, counseling, and support to control your symptoms. The following are some diet suggestions. FOODS THAT MAKE IBS WORSE  Fatty foods, such as [place] fries.  Milk products, such as cheese or ice cream.  Chocolate.  Alcohol.  Caffeine (found in coffee and some sodas).  Carbonated drinks, such as soda. If certain foods cause symptoms, you should eat less of them or stop eating them. FOOD JOURNAL   Keep a journal of the foods that seem to cause distress. Write down:  What you are eating during the day and when.  What problems you are having after eating.  When the symptoms occur in relation to your meals.  What foods always make you feel badly.  Take your notes with you to your caregiver to see if you should stop eating certain foods. FOODS THAT MAKE IBS BETTER Fiber reduces IBS symptoms, especially constipation, because it makes stools soft, bulky, and easier to pass. Fiber is found in bran, bread, cereal, beans, fruit, and vegetables. Examples of foods with fiber include:  Apples.  Peaches.  Pears.  Berries.  Figs.  Broccoli, raw.  Cabbage.  Carrots.  Raw peas.  Kidney beans.  Lima beans.  Whole-grain bread.  Whole-grain cereal. Add foods with fiber to your diet a little at a time. This will let your body get used to them. Too much fiber at once might cause gas and swelling of your abdomen. This can trigger symptoms in a person with IBS. Caregivers usually recommend a diet with enough fiber to produce soft, painless bowel movements. High fiber diets may  cause gas and bloating. However, these symptoms often go away within a few weeks, as your body adjusts. In many cases, dietary fiber may lessen IBS symptoms, particularly constipation. However, it may not help pain or diarrhea. High fiber diets keep the colon mildly enlarged (distended) with the added fiber. This may help prevent spasms in the colon. Some forms of fiber also keep water in the stool, thereby preventing hard stools that are difficult to pass.  Besides telling you to eat more foods with fiber, your caregiver may also tell you to get more fiber by taking a fiber pill or drinking water mixed with a special high fiber powder. An example of this is a natural fiber laxative containing psyllium seed.  TIPS  Large meals can cause cramping and diarrhea in people with IBS. If this happens to you, try eating 4 or 5 small meals a day, or try eating less at each of your usual 3 meals. It may also help if your meals are low in fat and high in carbohydrates. Examples of carbohydrates are pasta, rice, whole-grain breads and cereals, fruits, and vegetables.  If dairy products cause your symptoms to flare up, you can try eating less of those foods. You might be able to handle yogurt better than other dairy products, because it contains bacteria that helps with digestion. Dairy products are an important source of calcium and other nutrients. If you need to avoid dairy products, be sure to talk with a Registered Dietitian about getting these nutrients  through other food sources.  Drink enough water and fluids to keep your urine clear or pale yellow. This is important, especially if you have diarrhea. FOR MORE INFORMATION  International Foundation for Functional Gastrointestinal Disorders: www.iffgd.org  National Digestive Diseases Information Clearinghouse: digestive.AmenCredit.is Document Released: 02/29/2004 Document Revised: 03/02/2012 Document Reviewed: 03/11/2014 [facility] Patient Information 2015  [place], [place]. This information is not intended to replace advice given to you by your health care provider. Make sure you discuss any questions you have with your health care provider.

## 2014-12-28 ENCOUNTER — Telehealth: Payer: Self-pay | Admitting: *Deleted

## 2014-12-28 LAB — MICROALBUMIN / CREATININE URINE RATIO
Creatinine, Urine: 112.4 mg/dL
Microalb Creat Ratio: 7.1 mg/g (ref 0.0–30.0)
Microalb, Ur: 0.8 mg/dL (ref <2.0)

## 2014-12-28 NOTE — Telephone Encounter (Signed)
Pt is aware of results and will start one a day woman's 50+ in addition to an iron supplement. Jazmin Hartsell,CMA

## 2014-12-28 NOTE — Telephone Encounter (Signed)
-----   Message from Andrena Mews, MD sent at 12/28/2014  9:58 AM EST ----- Please inform patient her Hgb is still low, have her start Iron supplement, she can get this OTC take twice daily. Also use MVI.

## 2015-01-04 ENCOUNTER — Other Ambulatory Visit: Payer: Self-pay | Admitting: Family Medicine

## 2015-01-05 ENCOUNTER — Encounter (HOSPITAL_COMMUNITY): Payer: Self-pay | Admitting: Cardiology

## 2015-01-24 ENCOUNTER — Other Ambulatory Visit (INDEPENDENT_AMBULATORY_CARE_PROVIDER_SITE_OTHER): Payer: Medicare PPO

## 2015-01-24 DIAGNOSIS — E041 Nontoxic single thyroid nodule: Secondary | ICD-10-CM

## 2015-01-24 LAB — TSH: TSH: 1.04 u[IU]/mL (ref 0.35–4.50)

## 2015-01-24 LAB — T4, FREE: Free T4: 0.81 ng/dL (ref 0.60–1.60)

## 2015-01-27 ENCOUNTER — Ambulatory Visit (INDEPENDENT_AMBULATORY_CARE_PROVIDER_SITE_OTHER): Payer: Medicare PPO | Admitting: Endocrinology

## 2015-01-27 ENCOUNTER — Encounter: Payer: Self-pay | Admitting: Endocrinology

## 2015-01-27 VITALS — BP 164/72 | HR 73 | Temp 98.1°F | Resp 14 | Ht 63.0 in | Wt 159.0 lb

## 2015-01-27 DIAGNOSIS — E049 Nontoxic goiter, unspecified: Secondary | ICD-10-CM

## 2015-01-27 DIAGNOSIS — E041 Nontoxic single thyroid nodule: Secondary | ICD-10-CM

## 2015-01-27 NOTE — Progress Notes (Signed)
Patient ID: [name], female   DOB: 04/27/1941, 74 y.o.   MRN: [id number]    Reason for Appointment: Goiter, followup    History of Present Illness:   The patient's thyroid enlargement was first discovered in 1992 At that time she had a right lobectomy done for a nodule that showed Hashimoto thyroiditis and also a microscopic focus of papillary carcinoma on pathology.. Since then she had been on thyroid suppression  She was seen in consultation in 2014 and was felt to have a left-sided thyroid enlargement related to [place] thyroiditis She has had an ultrasound exam in  4/14 which showed a heterogenous left lobe without a discrete nodule and also some remnant of the right thyroid lobe Her TSH level was 0.54 and she was advised to try and leave off the levothyroxine; follow-up TSH was still low at 0.29  Her TSH is now back to normal Does not think she has any more fatigued than usual   She has had no difficulty with swallowing  Does not feel like she has any choking sensation in her neck or  local pressure  Lab Results  Component Value Date   FREET4 0.81 01/24/2015   FREET4 0.83 07/22/2014   FREET4 0.91 05/24/2014   TSH 1.04 01/24/2015   TSH 0.29* 07/22/2014   TSH 0.54 05/24/2014        Medication List       This list is accurate as of: 01/27/15  8:10 AM.  Always use your most recent med list.               amLODipine 10 MG tablet  Commonly known as:  NORVASC  Take 10 mg by mouth daily.     aspirin 81 MG chewable tablet  Chew 81 mg by mouth daily.     atorvastatin 40 MG tablet  Commonly known as:  LIPITOR  Take 1 tablet (40 mg total) by mouth daily at 6 PM.     carvedilol 25 MG tablet  Commonly known as:  COREG  TAKE ONE TABLET BY MOUTH TWICE DAILY WITH MEALS     dicyclomine 10 MG capsule  Commonly known as:  BENTYL  Take 1 capsule (10 mg total) by mouth 4 (four) times daily -  before meals and at bedtime.     ferrous sulfate 325 (65 FE) MG tablet   Take 325 mg by mouth daily with breakfast.     fluticasone 50 MCG/ACT nasal spray  Commonly known as:  FLONASE  Place 2 sprays into the nose daily as needed. For allergies     hydrochlorothiazide 25 MG tablet  Commonly known as:  HYDRODIURIL  TAKE ONE TABLET BY MOUTH ONCE DAILY     ibuprofen 200 MG tablet  Commonly known as:  ADVIL,MOTRIN  Take 200 mg by mouth every 6 (six) hours as needed for moderate pain.     isosorbide mononitrate 60 MG 24 hr tablet  Commonly known as:  IMDUR  Take 1 tablet (60 mg total) by mouth daily.     LANTUS 100 UNIT/ML injection  Generic drug:  insulin glargine  INJECT 20 UNITS SUBCUTANEOUSLY ONCE DAILY IN THE MORNING     lisinopril 20 MG tablet  Commonly known as:  PRINIVIL,ZESTRIL  TAKE TWO TABLETS BY MOUTH ONCE DAILY     loratadine-pseudoephedrine 10-240 MG per 24 hr tablet  Commonly known as:  CLARITIN-D 24-hour  Take 1 tablet by mouth daily as needed. For allergies     metFORMIN  500 MG 24 hr tablet  Commonly known as:  GLUCOPHAGE-XR  Take 2 tablets (1,000 mg total) by mouth 2 (two) times daily.     multivitamin with minerals Tabs tablet  Take 1 tablet by mouth daily.     nitroGLYCERIN 0.4 MG SL tablet  Commonly known as:  NITROSTAT  Place 1 tablet (0.4 mg total) under the tongue every 5 (five) minutes as needed for chest pain.     omeprazole 20 MG capsule  Commonly known as:  PRILOSEC  Take 1 capsule (20 mg total) by mouth daily.     ONE TOUCH ULTRA TEST test strip  Generic drug:  glucose blood  USE TO CHECK BLOOD SUGARS IN THE MORNING AND THE EVENING     ONETOUCH DELICA LANCETS 29F Misc     PRESCRIPTION MEDICATION  Take 1 tablet by mouth daily. cholesterol medication     RELION INSULIN SYRINGE 1ML/31G 31G X 5/16" 1 ML Misc  Generic drug:  Insulin Syringe-Needle U-100        Allergies:  Allergies  Allergen Reactions  . Codeine     REACTION: GI Intolerance  . Sulfamethoxazole-Trimethoprim     REACTION: Gi intolerence   . Tramadol Nausea And Vomiting    Past Medical History  Diagnosis Date  . History of thyroid cancer   . ETD (eustachian tube dysfunction)   . CTS (carpal tunnel syndrome)   . Anemia   . Gastritis   . Postmenopausal   . Hypothyroid   . Cor pulmonale   . Hypertension, benign   . Diabetes mellitus, type II   . Hypercholesteremia   . GERD (gastroesophageal reflux disease)   . Coronary artery disease     a. Remote PTCA of the PDA;  b. 02/2009 PCI/CBA to the D1;  c. 11/2011 Cath: LM nl, LAD 20p, 71m, D1 patent, RI 20p, LCX nl, RCA 30d, EF 55-65%.  . 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 Myocardial infarction     Past Surgical History  Procedure Laterality Date  . Angioplasty      PDA 90-40 %  . Thyroidectomy Right 10-06-1991    Hashimotos and papillary carcinoma  . Cholecystectomy  08-23-2002  . Endometrial biopsy  02-20-1998  . Bartholin gland cyst excision  09-13-2004    Keratosis- no CA  . Ptca  10/23/2000 and 03/15/09  . Cardiac catheterization  03/15/2009    stenosis of diagonal branch of LAD- PTCA performed  . Coronary angioplasty    . Tonsillectomy    . Breast biopsy      LEFT SPOT REMOVED  . Tubal ligation    . Left heart catheterization with coronary angiogram N/A 11/26/2011    Procedure: LEFT HEART CATHETERIZATION WITH CORONARY ANGIOGRAM;  Surgeon: Peter M Martinique, MD;  Location: [facility] CATH LAB;  Service: Cardiovascular;  Laterality: N/A;    Family History  Problem Relation Age of Onset  . Diabetes Mother     Deceased- hypoglycemic coma   . Hypertension Mother   . Other Father     unaware of his health history - died when pt was 74 yr old.    Social History:  reports that she quit smoking about 34 years ago. Her smoking use included Cigarettes. She quit smokeless tobacco use about 20 years ago. Her smokeless tobacco use included Chew. She reports that she does not drink alcohol or use illicit drugs.   Review of  Systems:  There is a  history of high blood pressure.              Has a  history of Diabetes.  Apparently recently A1c is 7            Examination:   BP 164/72 mmHg  Pulse 73  Temp(Src) 98.1 F (36.7 C)  Resp 14  Ht 5\' 3"  (1.6 m)  Wt 159 lb (72.122 kg)  BMI 28.17 kg/m2  SpO2 99%  LMP 11/24/1992        Thyroid is palpable, the left lobe is larger than the right; it is felt mostly on swallowing, it is smooth and firm and is somewhat irregular.  Left lobe is about 2-2-1/2  times normal Right side and isthmus are just palpable and firm without discrete nodules felt No lymphadenopathy in the neck  Biceps reflexes are normal   Assessment/Plan:  History of Hashimoto thyroiditis as shown on previous surgery of the right lobe Continued stable enlargement of the left lobe of the thyroid which has no discrete nodule on previous ultrasound This is likely to be related to Hashimoto thyroiditis  Currently she is euthyroid and TSH is not suppressed, this was transiently present previously and she has not been on any thyroid supplement   Will check her TSH again in  12 months and follow her thyroid enlargement clinically   [facility] 01/27/2015

## 2015-03-23 ENCOUNTER — Other Ambulatory Visit (HOSPITAL_COMMUNITY): Payer: Self-pay | Admitting: Cardiology

## 2015-03-23 DIAGNOSIS — I6521 Occlusion and stenosis of right carotid artery: Secondary | ICD-10-CM

## 2015-03-31 ENCOUNTER — Ambulatory Visit (HOSPITAL_COMMUNITY): Payer: Medicare PPO | Attending: Internal Medicine | Admitting: Cardiology

## 2015-03-31 DIAGNOSIS — I6521 Occlusion and stenosis of right carotid artery: Secondary | ICD-10-CM | POA: Diagnosis not present

## 2015-03-31 DIAGNOSIS — I6523 Occlusion and stenosis of bilateral carotid arteries: Secondary | ICD-10-CM

## 2015-03-31 NOTE — Progress Notes (Signed)
Carotid duplex performed 

## 2015-04-04 ENCOUNTER — Encounter: Payer: Self-pay | Admitting: Family Medicine

## 2015-04-04 ENCOUNTER — Ambulatory Visit (INDEPENDENT_AMBULATORY_CARE_PROVIDER_SITE_OTHER): Payer: Medicare PPO | Admitting: Family Medicine

## 2015-04-04 VITALS — BP 139/55 | HR 67 | Temp 98.7°F | Ht 62.0 in | Wt 163.4 lb

## 2015-04-04 DIAGNOSIS — IMO0001 Reserved for inherently not codable concepts without codable children: Secondary | ICD-10-CM | POA: Insufficient documentation

## 2015-04-04 DIAGNOSIS — E1165 Type 2 diabetes mellitus with hyperglycemia: Secondary | ICD-10-CM

## 2015-04-04 DIAGNOSIS — Z0000 Encounter for general adult medical examination without abnormal findings: Secondary | ICD-10-CM

## 2015-04-04 DIAGNOSIS — IMO0002 Reserved for concepts with insufficient information to code with codable children: Secondary | ICD-10-CM

## 2015-04-04 LAB — POCT GLYCOSYLATED HEMOGLOBIN (HGB A1C): HEMOGLOBIN A1C: 8.4

## 2015-04-04 NOTE — Assessment & Plan Note (Signed)
Normal exam. Age appropriate counseling done. She is up to date with health maintenance. I reviewed record of her mammogram: result below. Patient doing well in general. Continue yearly physical.  RECOMMENDATION: Screening mammogram in one year. (Code:[id number])  BI-RADS CATEGORY 1: Negative.   Electronically Signed  By: Andres Shad  On: 11/03/2014 12:19

## 2015-04-04 NOTE — Patient Instructions (Signed)
It was nice seeing you today [name]. I am happy you are doing ok but your A1C number went up likely because you weren't taking you metformin as you should. Please get back on your medication and we will recheck you A1C in 3 months. Also work on [employer] and exercise.   I will love for you to schedule a wellness physical with Dr McDiarmid who is also part of this clinic. Please call me if you need anything.

## 2015-04-04 NOTE — Assessment & Plan Note (Signed)
Poorly controlled due to medication non-compliance. I stressed need for compliance. She stated she will do better with taking her medication. Diet and exercise counseling also done. At this time no adjustment is made to her DM regimen. Recheck A1C in 3 months.

## 2015-04-04 NOTE — Progress Notes (Signed)
Patient ID: [name], female   DOB: [birth year], 74 y.o.   MRN: [id number] Subjective:     [name] is a 74 y.o. female and is here for a comprehensive physical exam. The patient reports no problems.  DM 2: Here for follow up. She had been compliant with her Lantus 20 unit daily however she had not been taking her Metformin as instructed for more than 3 wks now. She takes 2 tablet of her 500mg  in the morning and forgets to take the evening pills. She stated her home CBG has been ranging from 135 to 140, denies anything over 150s or under 60s.  History   Social History  . Marital Status: Widowed    Spouse Name: N/A  . Number of Children: 6  . Years of Education: N/A   Occupational History  . Housekeeper [employer]   Social History Main Topics  . Smoking status: Former Smoker    Types: Cigarettes    Quit date: 12/23/1980  . Smokeless tobacco: Former Systems developer    Types: [place] date: 08/23/1994  . Alcohol Use: No  . Drug Use: No  . Sexual Activity: No   Other Topics Concern  . Not on file   Social History Narrative   Lives in [place] with Granddaughter.  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  She is not routinely exercising.   Health Maintenance  Topic Date Due  . PNA vac Low Risk Adult (2 of 2 - PCV13) 11/25/2012  . OPHTHALMOLOGY EXAM  10/01/2013  . HEMOGLOBIN A1C  06/27/2015  . TETANUS/TDAP  06/28/2015  . INFLUENZA VACCINE  07/24/2015  . FOOT EXAM  12/28/2015  . URINE MICROALBUMIN  12/28/2015  . MAMMOGRAM  11/02/2016  . COLONOSCOPY  07/03/2023  . DEXA SCAN  Completed  . ZOSTAVAX  Addressed    The following portions of the patient's history were reviewed and updated as appropriate: allergies, current medications, past family history, past medical history, past social history, past surgical history and problem list.  Review of Systems Pertinent items are noted in HPI.   Objective:    BP 139/55 mmHg  Pulse 67  Temp(Src) 98.7 F (37.1 C)  (Oral)  Ht 5\' 2"  (1.575 m)  Wt 163 lb 6 oz (74.106 kg)  BMI 29.87 kg/m2  LMP 11/24/1992 General appearance: alert and cooperative Eyes: conjunctivae/corneas clear. PERRL, EOM's intact. Fundi benign. Ears: normal TM's and external ear canals both ears Throat: lips, mucosa, and tongue normal; teeth and gums normal Neck: no adenopathy, no carotid bruit, no JVD, supple, symmetrical, trachea midline and thyroid not enlarged, symmetric, no tenderness/mass/nodules Lungs: clear to auscultation bilaterally Heart: regular rate and rhythm, S1, S2 normal, no murmur, click, rub or gallop Abdomen: soft, non-tender; bowel sounds normal; no masses,  no organomegaly Pelvic: deferred. Extremities: extremities normal, atraumatic, no cyanosis or edema Pulses: 2+ and symmetric Neurologic: Alert and oriented X 3, normal strength and tone. Normal symmetric reflexes. Normal coordination and gait    Assessment:    Healthy female exam. Normal exam    DM2 not well controlled. Plan:     See After Visit Summary for Counseling Recommendations

## 2015-04-05 ENCOUNTER — Other Ambulatory Visit: Payer: Self-pay | Admitting: Family Medicine

## 2015-04-07 ENCOUNTER — Telehealth: Payer: Self-pay | Admitting: Family Medicine

## 2015-04-07 NOTE — Telephone Encounter (Signed)
Checking status of refill on metformin, pt would like to know if she can get a 3 month supply?

## 2015-04-10 ENCOUNTER — Other Ambulatory Visit: Payer: Self-pay | Admitting: *Deleted

## 2015-04-11 MED ORDER — HYDROCHLOROTHIAZIDE 25 MG PO TABS: 25.0000 mg | ORAL_TABLET | Freq: Every day | ORAL | Status: DC

## 2015-04-27 ENCOUNTER — Ambulatory Visit (INDEPENDENT_AMBULATORY_CARE_PROVIDER_SITE_OTHER): Payer: Medicare PPO | Admitting: Family Medicine

## 2015-04-27 ENCOUNTER — Encounter: Payer: Self-pay | Admitting: Family Medicine

## 2015-04-27 VITALS — BP 141/50 | HR 73 | Ht 62.0 in | Wt 162.0 lb

## 2015-04-27 DIAGNOSIS — IMO0002 Reserved for concepts with insufficient information to code with codable children: Secondary | ICD-10-CM

## 2015-04-27 DIAGNOSIS — Z0000 Encounter for general adult medical examination without abnormal findings: Secondary | ICD-10-CM

## 2015-04-27 DIAGNOSIS — E1165 Type 2 diabetes mellitus with hyperglycemia: Secondary | ICD-10-CM

## 2015-04-27 MED ORDER — ONETOUCH ULTRA MINI W/DEVICE KIT: PACK | Status: DC

## 2015-04-27 NOTE — Patient Instructions (Signed)
[name] , Thank you for taking time to come for your Medicare Wellness Visit. I appreciate your ongoing commitment to your health goals. Please review the following plan we discussed and let me know if I can assist you in the future.   These are the goals we discussed: Goals    . Increase lean proteins     Breakfast      Risk Factors Diabetes High blood pressure Overweight Inadequate Exercise  This is a list of the screening recommended for you and due dates:  Health Maintenance  Topic Date Due  . Pneumonia vaccines (2 of 2 - PCV13) 11/25/2012  . Eye exam for diabetics  10/01/2013  . Tetanus Vaccine  06/28/2015  . Flu Shot  07/24/2015  . Hemoglobin A1C  10/04/2015  . Complete foot exam   12/28/2015  . Urine Protein Check  12/28/2015  . Mammogram  11/02/2016  . Colon Cancer Screening  07/03/2023  . DEXA scan (bone density measurement)  Completed  . Shingles Vaccine  Addressed   [name] is all up to date on her Health Promotion, Disease Prevention  Education and Counseling Provided:  See after visit summary that was printed and given to patient 1. Referrals : Nutritionist (Medical Nutritional Therapy is covered 100% by Medicare) 2. Referral Eye Doctor (Glaucoma testing is covered 100% by Medicare)  Producer, television/film/video Information Description of Services Cost  A Matter of Balance Class locations vary. Call [place] on Aging for more information.  [email or web address]-[email or web address]/ [phone] 8-Session program addressing the fear of falling and increasing activity levels of older adults Free to minimal cost  A.C.T. By [employer] [address], [place], [place] [address].  BetaBlues.dk [phone]  Personal training, gym, classes including Silver Sneakers* and ACTion for Aging Adults Fee-based  A.H.O.Y. (Add Health to [place]) Airs on Time [employer] 13, M-F at [place]: [employer],   [place] [facility] [place],  [place], [place] [facility], [address] [facility] [place], [place], [place], [place] [address]  [place] Location: Sharrell Ku. [employer] [place]      [phone]  [phone]  [phone]  [phone]  [phone]  [phone]  [phone]  [phone]  [phone]  [phone]    [phone] A total-body conditioning class for adults 94 and older; designed to increase muscular strength, endurance, range of movement, flexibility, balance, agility and coordination Free  [facility] [place], [place] [address] [place]  [address]  [phone]  [place]'s class for individualsreturning to exercise after an injury, before or after surgery or for individuals with complex musculoskeletal issues; designed to improve strength, balance , flexibility  $15/class  [place] [address] [place], [place] [address] [email or web address] [place] classes for beginners to advanced [place] [facility] [place], [place] [address] [email or web address] [place] Chair Exercises Free, ages 32 and older; Ages 65-59 fee based  Marvia Pickles, [facility] [address], [place] [address] Seniorcenter'[email or web address] .[phone]  A.H.O.Y. Tai Chi Fee-based Donation based or free  [place] Class locations vary.  Call or email Angela Burke or view  website for more information. [email or web address] .[email or web address] [email or web address] [phone] Ongoing classes at local YMCAs and gyms Fee-based  Silver Sneakers A.C.T. By [place] [employer]: [place] [phone]  [phone] Classes designed for older adults who want to improve their strength, flexibility, balance and endurance.   Silver sneakers is covered by some insurance plans and includes a fitness center membership at participating locations. Find out more by calling [phone] or visiting [email or web address] Covered by some insurance plans  [facility] [place] [phone] A.H.O.Y., fitness room, personal training, fitness classes for injury prevention, strength, balance, flexibility, water fitness classes Ages 55+: $59 for 6 months; Ages 76-54: $71 for 6 months  Tai Chi for Everybody [facility] [address] [place], [place] [address] [email or web address] .[email or web address] Sears [phone] Tai Chi classes for beginners to advanced; geared for seniors Donation Based      [employer] ([place] Others Participate in Exercise     Loyal Gambler. Rosana Hoes, PhD, [place] [email or web address] .[place]     [phone]     A comprehensive fitness program for adults.  The program paris senior-level undergraduates Kinesiology students with adults who desire to learn how to exercise safely.  Includes a structural exercise class focusing on functional fitnesss     $100/semester in fall and spring; $75 in summer (no trainers)    *Silver Sneakers is covered by some Personal assistant and includes  a  Radio producer at participating locations.  Find out more by calling [phone] or visiting [email or web address]  For additional health and human services resources for senior adults, please contact SeniorLine at [phone] in [place] and [place] at [phone] in all other areas.

## 2015-04-27 NOTE — Progress Notes (Signed)
Subjective:   [name] is a 74 y.o. female who presents for an Initial Medicare Annual Wellness Visit.  TUGG Test 10 sec Minicog: 3/3        Objective:    Today's Vitals   04/27/15 1537  BP: 141/50  Pulse: 73  Height: 5\' 2"  (1.575 m)  Weight: 162 lb (73.483 kg)  PainSc: 0-No pain    Current Medications (verified) Outpatient Encounter Prescriptions as of 04/27/2015  Medication Sig  . amLODipine (NORVASC) 10 MG tablet TAKE ONE TABLET BY MOUTH ONCE DAILY  . aspirin 81 MG chewable tablet Chew 81 mg by mouth daily.    [name] atorvastatin (LIPITOR) 40 MG tablet Take 1 tablet (40 mg total) by mouth daily at 6 PM.  . carvedilol (COREG) 25 MG tablet TAKE ONE TABLET BY MOUTH TWICE DAILY WITH MEALS  . ferrous sulfate 325 (65 FE) MG tablet Take 325 mg by mouth daily with breakfast.  . fluticasone (FLONASE) 50 MCG/ACT nasal spray Place 2 sprays into the nose daily as needed. For allergies  . hydrochlorothiazide (HYDRODIURIL) 25 MG tablet Take 1 tablet (25 mg total) by mouth daily.  [name] ibuprofen (ADVIL,MOTRIN) 200 MG tablet Take 200 mg by mouth every 6 (six) hours as needed for moderate pain.  . isosorbide mononitrate (IMDUR) 60 MG 24 hr tablet TAKE ONE TABLET BY MOUTH ONCE DAILY  . LANTUS 100 UNIT/ML injection INJECT 20 UNITS SUBCUTANEOUSLY ONCE DAILY IN THE MORNING  . lisinopril (PRINIVIL,ZESTRIL) 20 MG tablet TAKE TWO TABLETS BY MOUTH ONCE DAILY  . metFORMIN (GLUCOPHAGE-XR) 500 MG 24 hr tablet TAKE FOUR TABLETS BY MOUTH ONCE DAILY WITH BREAKFAST  . Multiple Vitamin (MULTIVITAMIN WITH MINERALS) TABS tablet Take 1 tablet by mouth daily.  . nitroGLYCERIN (NITROSTAT) 0.4 MG SL tablet Place 1 tablet (0.4 mg total) under the tongue every 5 (five) minutes as needed for chest pain.  . ONE TOUCH ULTRA TEST test strip USE TO CHECK BLOOD SUGARS IN THE MORNING AND THE EVENING  . ONETOUCH DELICA LANCETS 85U MISC   . RELION INSULIN SYRINGE 1ML/31G 31G X 5/16" 1 ML MISC   . metFORMIN  (GLUCOPHAGE-XR) 500 MG 24 hr tablet Take 2 tablets (1,000 mg total) by mouth 2 (two) times daily. (Patient not taking: Reported on 04/27/2015)   No facility-administered encounter medications on file as of 04/27/2015.    Allergies (verified) Codeine; Sulfamethoxazole-trimethoprim; and Tramadol   History: Past Medical History  Diagnosis Date  . History of thyroid cancer   . ETD (eustachian tube dysfunction)   . CTS (carpal tunnel syndrome)   . Anemia   . Gastritis   . Postmenopausal   . Hypothyroid   . Cor pulmonale   . Hypertension, benign   . Diabetes mellitus, type II   . Hypercholesteremia   . GERD (gastroesophageal reflux disease)   . Coronary artery disease     a. Remote PTCA of the PDA;  b. 02/2009 PCI/CBA to the D1;  c. 11/2011 Cath: LM nl, LAD 20p, 84m, D1 patent, RI 20p, LCX nl, RCA 30d, EF 55-65%.  . 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 Myocardial infarction    Past Surgical History  Procedure Laterality Date  . Angioplasty      PDA 90-40 %  . Thyroidectomy Right 10-06-1991    Hashimotos and papillary carcinoma  . Cholecystectomy  08-23-2002  . Endometrial biopsy  02-20-1998  . Bartholin gland cyst excision  09-13-2004  Keratosis- no CA  . Ptca  10/23/2000 and 03/15/09  . Cardiac catheterization  03/15/2009    stenosis of diagonal branch of LAD- PTCA performed  . Coronary angioplasty    . Tonsillectomy    . Breast biopsy      LEFT SPOT REMOVED  . Tubal ligation    . Left heart catheterization with coronary angiogram N/A 11/26/2011    Procedure: LEFT HEART CATHETERIZATION WITH CORONARY ANGIOGRAM;  Surgeon: Peter M Martinique, MD;  [facility] C[facility] LAB;  Service: Cardiovascular;  Laterality: N/A;   Family History  Problem Relation Age of Onset  . Diabetes Mother     Deceased- hypoglycemic coma   . Hypertension Mother   . Other Father     unaware of his health history - died when pt was 75 yr old.   Social History    Occupational History  . Housekeeper [employer]   Social History Main Topics  . Smoking status: Former Smoker    Types: Cigarettes    Quit date: 12/23/1980  . Smokeless tobacco: Former Systems developer    Types: [place] date: 08/23/1994  . Alcohol Use: No  . Drug Use: No  . Sexual Activity: No    Tobacco Counseling Counseling given: Not Answered   Activities of Daily Living In your present state of health, do you have any difficulty performing the following activities: 04/27/2015  Hearing? N  Vision? N  Difficulty concentrating or making decisions? N  Walking or climbing stairs? N  Dressing or bathing? N  Doing errands, shopping? N    Immunizations and Health Maintenance Immunization History  Administered Date(s) Administered  . Influenza Split 09/15/2012  . Influenza Whole 10/11/2008, 09/28/2009, 10/22/2010, 09/04/2011  . Influenza,inj,Quad PF,36+ Mos 10/15/2013, 10/14/2014  . Pneumococcal Polysaccharide-23 06/22/1994, 11/26/2011  . Td 06/27/2005   Health Maintenance Due  Topic Date Due  . PNA vac Low Risk Adult (2 of 2 - PCV13) 11/25/2012  . OPHTHALMOLOGY EXAM  10/01/2013    Patient Care Team: [name], MD as PCP - General (Family Medicine)  Indicate any recent Medical Services you may have received from other than [facility] providers in the past year (date may be approximate).     Assessment:   This is a routine wellness examination for [name].   Hearing/Vision screen pt denies impairments  Dietary issues and exercise activities discussed: yes    Goals    None     Depression Screen PHQ 2/9 Scores 04/27/2015 04/04/2015 12/27/2014 12/02/2013  PHQ - 2 Score 0 0 0 0    Fall Risk Fall Risk  04/27/2015 04/04/2015 12/27/2014 12/02/2013  Falls in the past year? No Yes Yes No  Number falls in past yr: - 1 1 -  Injury with Fall? - No Yes -    Cognitive Function: No flowsheet data found.  Screening Tests Health Maintenance  Topic Date Due  . PNA vac Low Risk Adult  (2 of 2 - PCV13) 11/25/2012  . OPHTHALMOLOGY EXAM  10/01/2013  . TETANUS/TDAP  06/28/2015  . INFLUENZA VACCINE  07/24/2015  . HEMOGLOBIN A1C  10/04/2015  . FOOT EXAM  12/28/2015  . URINE MICROALBUMIN  12/28/2015  . MAMMOGRAM  11/02/2016  . COLONOSCOPY  07/03/2023  . DEXA SCAN  Completed  . ZOSTAVAX  Addressed      Plan:    Risk Factors Overweight Glaucoma risk (AA > age 56 and diabetes Diabetes  During the course of the visit, Quinne was educated and counseled  about the following appropriate screening and preventive services:  Glaucoma screening referral to ophthalmologist  Nutrition counseling card for Dr Jenne Campus given to patient   Patient Instructions (the written plan) were given to the patient.    MCDIARMID,TODD D, MD   04/27/2015

## 2015-05-01 ENCOUNTER — Other Ambulatory Visit: Payer: Self-pay | Admitting: *Deleted

## 2015-05-01 DIAGNOSIS — IMO0002 Reserved for concepts with insufficient information to code with codable children: Secondary | ICD-10-CM

## 2015-05-01 DIAGNOSIS — E1165 Type 2 diabetes mellitus with hyperglycemia: Secondary | ICD-10-CM

## 2015-05-01 MED ORDER — ONETOUCH ULTRA MINI W/DEVICE KIT: PACK | Status: DC

## 2015-05-01 MED ORDER — "RELION INSULIN SYRINGE 31G X 5/16"" 1 ML MISC": Status: DC

## 2015-05-01 MED ORDER — AMLODIPINE BESYLATE 10 MG PO TABS: 10.0000 mg | ORAL_TABLET | Freq: Every day | ORAL | Status: DC

## 2015-05-01 MED ORDER — HYDROCHLOROTHIAZIDE 25 MG PO TABS: 25.0000 mg | ORAL_TABLET | Freq: Every day | ORAL | Status: DC

## 2015-05-01 MED ORDER — LISINOPRIL 20 MG PO TABS: 40.0000 mg | ORAL_TABLET | Freq: Every day | ORAL | Status: DC

## 2015-05-01 MED ORDER — INSULIN GLARGINE 100 UNIT/ML ~~LOC~~ SOLN: SUBCUTANEOUS | Status: DC

## 2015-05-01 MED ORDER — GLUCOSE BLOOD VI STRP: ORAL_STRIP | Status: DC

## 2015-05-01 MED ORDER — METFORMIN HCL ER 500 MG PO TB24: 1000.0000 mg | ORAL_TABLET | Freq: Two times a day (BID) | ORAL | Status: DC

## 2015-05-01 MED ORDER — CARVEDILOL 25 MG PO TABS: 25.0000 mg | ORAL_TABLET | Freq: Two times a day (BID) | ORAL | Status: DC

## 2015-05-01 MED ORDER — ONETOUCH DELICA LANCETS 33G MISC: Status: DC

## 2015-05-01 MED ORDER — ISOSORBIDE MONONITRATE ER 60 MG PO TB24: 60.0000 mg | ORAL_TABLET | Freq: Every day | ORAL | Status: DC

## 2015-07-26 ENCOUNTER — Other Ambulatory Visit: Payer: Self-pay | Admitting: *Deleted

## 2015-07-26 MED ORDER — ATORVASTATIN CALCIUM 40 MG PO TABS: 40.0000 mg | ORAL_TABLET | Freq: Every day | ORAL | Status: DC

## 2015-09-25 ENCOUNTER — Other Ambulatory Visit: Payer: Self-pay

## 2015-09-25 DIAGNOSIS — Z1231 Encounter for screening mammogram for malignant neoplasm of breast: Secondary | ICD-10-CM

## 2015-11-06 ENCOUNTER — Ambulatory Visit
Admission: RE | Admit: 2015-11-06 | Discharge: 2015-11-06 | Disposition: A | Discharge status: Home / Self Care | Payer: Medicare PPO | Source: Ambulatory Visit

## 2015-11-06 DIAGNOSIS — Z1231 Encounter for screening mammogram for malignant neoplasm of breast: Secondary | ICD-10-CM

## 2015-11-28 ENCOUNTER — Ambulatory Visit (INDEPENDENT_AMBULATORY_CARE_PROVIDER_SITE_OTHER): Payer: Medicare PPO | Admitting: Family Medicine

## 2015-11-28 ENCOUNTER — Encounter: Payer: Self-pay | Admitting: Family Medicine

## 2015-11-28 VITALS — BP 142/62 | HR 72 | Temp 98.0°F | Ht 62.0 in | Wt 159.0 lb

## 2015-11-28 DIAGNOSIS — Z23 Encounter for immunization: Secondary | ICD-10-CM

## 2015-11-28 DIAGNOSIS — E7800 Pure hypercholesterolemia, unspecified: Secondary | ICD-10-CM

## 2015-11-28 DIAGNOSIS — I10 Essential (primary) hypertension: Secondary | ICD-10-CM

## 2015-11-28 DIAGNOSIS — E1169 Type 2 diabetes mellitus with other specified complication: Secondary | ICD-10-CM

## 2015-11-28 DIAGNOSIS — E1165 Type 2 diabetes mellitus with hyperglycemia: Secondary | ICD-10-CM | POA: Diagnosis not present

## 2015-11-28 LAB — POCT GLYCOSYLATED HEMOGLOBIN (HGB A1C): Hemoglobin A1C: 8.8

## 2015-11-28 LAB — COMPLETE METABOLIC PANEL WITH GFR
ALBUMIN: 4.2 g/dL (ref 3.6–5.1)
ALK PHOS: 70 U/L (ref 33–130)
ALT: 13 U/L (ref 6–29)
AST: 14 U/L (ref 10–35)
BILIRUBIN TOTAL: 0.8 mg/dL (ref 0.2–1.2)
BUN: 17 mg/dL (ref 7–25)
CO2: 34 mmol/L — AB (ref 20–31)
CREATININE: 0.87 mg/dL (ref 0.60–0.93)
Calcium: 10 mg/dL (ref 8.6–10.4)
Chloride: 98 mmol/L (ref 98–110)
GFR, EST AFRICAN AMERICAN: 77 mL/min (ref >=60)
GFR, EST NON AFRICAN AMERICAN: 67 mL/min (ref >=60)
GLUCOSE: 152 mg/dL — AB (ref 65–99)
Potassium: 3.5 mmol/L (ref 3.5–5.3)
SODIUM: 140 mmol/L (ref 135–146)
TOTAL PROTEIN: 7 g/dL (ref 6.1–8.1)

## 2015-11-28 NOTE — Assessment & Plan Note (Addendum)
BP looks good. No medication adjustment needed. Diet counseling done. CMET checked today.

## 2015-11-28 NOTE — Assessment & Plan Note (Signed)
Last FLP 1 yr ago was normal. Continue Lipitor 40. F/U for FLP next year.  Diet counseling done.

## 2015-11-28 NOTE — Assessment & Plan Note (Signed)
A1C worsened a little. I am ok with her A1C at 8 for her age of 45, but worry it is creeping up. She seems to be nonadherent with medication and diet. I again counseled her on compliance. She will work on this. Continue current DM regimen.  I will reassess her in 3 months. CBG parameters given,

## 2015-11-28 NOTE — Patient Instructions (Addendum)
It was nice seeing you today, your A1C is still about the same, a bit higher than the last time. Although this is not too bad for your age. I worry because it went up a bit. Please let us keep an eye on your diet. Try to exercise. I will like for you to keep record of your glucose checked at home and I will see you back in 3 month for reassessment. Call if your glucose is running too high.  DASH Eating Plan DASH stands for "Dietary Approaches to Stop Hypertension." The DASH eating plan is a healthy eating plan that has been shown to reduce high blood pressure (hypertension). Additional health benefits may include reducing the risk of type 2 diabetes mellitus, heart disease, and stroke. The DASH eating plan may also help with weight loss. WHAT DO I NEED TO KNOW ABOUT THE DASH EATING PLAN? For the DASH eating plan, you will follow these general guidelines:  Choose foods with a percent daily value for sodium of less than 5% (as listed on the food label).  Use salt-free seasonings or herbs instead of table salt or sea salt.  Check with your health care provider or pharmacist before using salt substitutes.  Eat lower-sodium products, often labeled as "lower sodium" or "no salt added."  Eat fresh foods.  Eat more vegetables, fruits, and low-fat dairy products.  Choose whole grains. Look for the word "whole" as the first word in the ingredient list.  Choose fish and skinless chicken or [place] more often than red meat. Limit fish, poultry, and meat to 6 oz (170 g) each day.  Limit sweets, desserts, sugars, and sugary drinks.  Choose heart-healthy fats.  Limit cheese to 1 oz (28 g) per day.  Eat more home-cooked food and less restaurant, buffet, and fast food.  Limit fried foods.  Cook foods using methods other than frying.  Limit canned vegetables. If you do use them, rinse them well to decrease the sodium.  When eating at a restaurant, ask that your food be prepared with less salt, or  no salt if possible. WHAT FOODS CAN I EAT? Seek help from a dietitian for individual calorie needs. Grains Whole grain or whole wheat bread. Brown rice. Whole grain or whole wheat pasta. Quinoa, bulgur, and whole grain cereals. Low-sodium cereals. Corn or whole wheat flour tortillas. Whole grain cornbread. Whole grain crackers. Low-sodium crackers. Vegetables Fresh or frozen vegetables (raw, steamed, roasted, or grilled). Low-sodium or reduced-sodium tomato and vegetable juices. Low-sodium or reduced-sodium tomato sauce and paste. Low-sodium or reduced-sodium canned vegetables.  Fruits All fresh, canned (in natural juice), or frozen fruits. Meat and Other Protein Products Ground beef (85% or leaner), grass-fed beef, or beef trimmed of fat. Skinless chicken or [place]. Ground chicken or [place]. Pork trimmed of fat. All fish and seafood. Eggs. Dried beans, peas, or lentils. Unsalted nuts and seeds. Unsalted canned beans. Dairy Low-fat dairy products, such as skim or 1% milk, 2% or reduced-fat cheeses, low-fat ricotta or cottage cheese, or plain low-fat yogurt. Low-sodium or reduced-sodium cheeses. Fats and Oils Tub margarines without trans fats. Light or reduced-fat mayonnaise and salad dressings (reduced sodium). Avocado. Safflower, olive, or canola oils. Natural peanut or almond butter. Other Unsalted popcorn and pretzels. The items listed above may not be a complete list of recommended foods or beverages. Contact your dietitian for more options. WHAT FOODS ARE NOT RECOMMENDED? Grains White bread. White pasta. White rice. Refined cornbread. Bagels and croissants. Crackers that contain trans fat. Vegetables  Creamed or fried vegetables. Vegetables in a cheese sauce. Regular canned vegetables. Regular canned tomato sauce and paste. Regular tomato and vegetable juices. Fruits Dried fruits. Canned fruit in light or heavy syrup. Fruit juice. Meat and Other Protein Products Fatty cuts of meat.  Ribs, chicken wings, bacon, sausage, bologna, salami, chitterlings, fatback, hot dogs, bratwurst, and packaged luncheon meats. Salted nuts and seeds. Canned beans with salt. Dairy Whole or 2% milk, cream, half-and-half, and cream cheese. Whole-fat or sweetened yogurt. Full-fat cheeses or blue cheese. Nondairy creamers and whipped toppings. Processed cheese, cheese spreads, or cheese curds. Condiments Onion and garlic salt, seasoned salt, table salt, and sea salt. Canned and packaged gravies. Worcestershire sauce. Tartar sauce. Barbecue sauce. Teriyaki sauce. Soy sauce, including reduced sodium. Steak sauce. Fish sauce. Oyster sauce. Cocktail sauce. Horseradish. Ketchup and mustard. Meat flavorings and tenderizers. Bouillon cubes. Hot sauce. Tabasco sauce. Marinades. Taco seasonings. Relishes. Fats and Oils Butter, stick margarine, lard, shortening, ghee, and bacon fat. Coconut, palm kernel, or palm oils. Regular salad dressings. Other Pickles and olives. Salted popcorn and pretzels. The items listed above may not be a complete list of foods and beverages to avoid. Contact your dietitian for more information. WHERE CAN I FIND MORE INFORMATION? National Heart, Lung, and Blood Institute: travelstabloid.com   This information is not intended to replace advice given to you by your health care provider. Make sure you discuss any questions you have with your health care provider.   Document Released: 11/28/2011 Document Revised: 12/30/2014 Document Reviewed: 10/13/2013 Elsevier Interactive Patient Education [employer].

## 2015-11-28 NOTE — Progress Notes (Signed)
Subjective:     Patient ID: [name], female   DOB: 1941/06/08, 74 y.o.   MRN: [id number]  HPI DM2/HTN/HLD: here for follow up. She is currently on Lantus 20 unit qd and metformin 526m XR 2 tabs BID, she forgets to take her medications once in a while and she endorsed poor diet. For her HTN she is on Norvasc 10 mg, Imdur 60 mg, Lisinopril 20 mg 2 tabs qd and Coreg 25 mg BID, she forgets to take her night time medications at times otherwise she is compliant with her antihypertensive agents. She is on Lipitor 40 qd for HLD. No concern today.  Note: she does check her CBG at home and her lowest glucose level ranges between 100-110 and highest ranges between 130-140.  Current Outpatient Prescriptions on File Prior to Visit  Medication Sig Dispense Refill  . amLODipine (NORVASC) 10 MG tablet Take 1 tablet (10 mg total) by mouth daily. 90 tablet 1  . aspirin 81 MG chewable tablet Chew 81 mg by mouth daily.      .Marland Kitchenatorvastatin (LIPITOR) 40 MG tablet Take 1 tablet (40 mg total) by mouth daily at 6 PM. 90 tablet 1  . carvedilol (COREG) 25 MG tablet Take 1 tablet (25 mg total) by mouth 2 (two) times daily with a meal. 180 tablet 1  . ferrous sulfate 325 (65 FE) MG tablet Take 325 mg by mouth daily with breakfast.    . hydrochlorothiazide (HYDRODIURIL) 25 MG tablet Take 1 tablet (25 mg total) by mouth daily. 90 tablet 1  . insulin glargine (LANTUS) 100 UNIT/ML injection INJECT 20 UNITS SUBCUTANEOUSLY ONCE DAILY IN THE MORNING. HOLD IF GLUCOSE IS LESS THAN 70 10 mL 5  . isosorbide mononitrate (IMDUR) 60 MG 24 hr tablet Take 1 tablet (60 mg total) by mouth daily. 90 tablet 1  . lisinopril (PRINIVIL,ZESTRIL) 20 MG tablet Take 2 tablets (40 mg total) by mouth daily. 180 tablet 1  . metFORMIN (GLUCOPHAGE-XR) 500 MG 24 hr tablet Take 2 tablets (1,000 mg total) by mouth 2 (two) times daily. 180 tablet 1  . Multiple Vitamin (MULTIVITAMIN WITH MINERALS) TABS tablet Take 1 tablet by mouth daily.    . Blood  Glucose Monitoring Suppl (ONE TOUCH ULTRA MINI) W/DEVICE KIT Check blood sugar twice a day before meals. Diabetes Mellitus Type 2. E11.65 1 each 0  . fluticasone (FLONASE) 50 MCG/ACT nasal spray Place 2 sprays into the nose daily as needed. For allergies 16 g 6  . glucose blood (ONE TOUCH ULTRA TEST) test strip USE TO CHECK BLOOD SUGARS IN THE MORNING AND THE EVENING 100 each 4  . ibuprofen (ADVIL,MOTRIN) 200 MG tablet Take 200 mg by mouth every 6 (six) hours as needed for moderate pain.    . nitroGLYCERIN (NITROSTAT) 0.4 MG SL tablet Place 1 tablet (0.4 mg total) under the tongue every 5 (five) minutes as needed for chest pain. (Patient not taking: Reported on 11/28/2015) 25 tablet 3  . ONETOUCH DELICA LANCETS 395AMISC Use to check blood sugar in the morning and the evening. 100 each 5  . RELION INSULIN SYRINGE 1ML/31G 31G X 5/16" 1 ML MISC Use to inject insulin SQ daily as instructed. 100 each 5   No current facility-administered medications on file prior to visit.   Past Medical History  Diagnosis Date  . History of thyroid cancer   . ETD (eustachian tube dysfunction)   . CTS (carpal tunnel syndrome)   . Anemia   . Gastritis   .  Postmenopausal   . Hypothyroid   . Cor pulmonale ([facility])   . Hypertension, benign   . Diabetes mellitus, type II ([place])   . Hypercholesteremia   . GERD (gastroesophageal reflux disease)   . Coronary artery disease     a. Remote PTCA of the PDA;  b. 02/2009 PCI/CBA to the D1;  c. 11/2011 Cath: LM nl, LAD 20p, 15m D1 patent, RI 20p, LCX nl, RCA 30d, EF 55-65%.  . 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 Myocardial infarction ([facility]      Review of Systems  Respiratory: Negative.   Cardiovascular: Negative.   Gastrointestinal: Negative.   Genitourinary: Negative.   Musculoskeletal: Negative.   All other systems reviewed and are negative.  Filed Vitals:   11/28/15 1056  BP: 149/55  Pulse: 72  Temp: 98 F (36.7 C)   TempSrc: Oral  Height: '5\' 2"'  (1.575 m)  Weight: 159 lb (72.122 kg)       Objective:   Physical Exam  Constitutional: She is oriented to person, place, and time. She appears well-developed. No distress.  Cardiovascular: Normal rate, regular rhythm and normal heart sounds.   No murmur heard. Pulmonary/Chest: Effort normal and breath sounds normal. No respiratory distress. She has no wheezes.  Abdominal: Soft. Bowel sounds are normal. She exhibits no distension and no mass. There is no tenderness.  Musculoskeletal: Normal range of motion. She exhibits no edema.  Neurological: She is alert and oriented to person, place, and time. No cranial nerve deficit.  Nursing note and vitals reviewed.      Assessment:     HTN DM2 HLD     Plan:     Check problem list.

## 2015-11-29 ENCOUNTER — Encounter: Payer: Self-pay | Admitting: Family Medicine

## 2015-11-29 ENCOUNTER — Telehealth: Payer: Self-pay | Admitting: Family Medicine

## 2015-11-29 NOTE — Telephone Encounter (Signed)
I called to discuss test result but she did not pick up. Plan to mail letter to her.

## 2015-12-26 ENCOUNTER — Other Ambulatory Visit: Payer: Self-pay | Admitting: Family Medicine

## 2016-01-11 LAB — HM DIABETES EYE EXAM

## 2016-01-24 ENCOUNTER — Other Ambulatory Visit (INDEPENDENT_AMBULATORY_CARE_PROVIDER_SITE_OTHER): Payer: Medicare Other

## 2016-01-24 DIAGNOSIS — E049 Nontoxic goiter, unspecified: Secondary | ICD-10-CM | POA: Diagnosis not present

## 2016-01-24 LAB — T4, FREE: FREE T4: 0.84 ng/dL (ref 0.60–1.60)

## 2016-01-24 LAB — TSH: TSH: 1.03 u[IU]/mL (ref 0.35–4.50)

## 2016-01-29 ENCOUNTER — Encounter: Payer: Self-pay | Admitting: Endocrinology

## 2016-01-29 ENCOUNTER — Ambulatory Visit (INDEPENDENT_AMBULATORY_CARE_PROVIDER_SITE_OTHER): Payer: Medicare Other | Admitting: Endocrinology

## 2016-01-29 VITALS — BP 124/62 | HR 73 | Temp 97.7°F | Resp 14 | Ht 62.0 in | Wt 158.0 lb

## 2016-01-29 DIAGNOSIS — E063 Autoimmune thyroiditis: Secondary | ICD-10-CM | POA: Diagnosis not present

## 2016-01-29 NOTE — Progress Notes (Signed)
Patient ID: [name], female   DOB: October 24, 1941, 75 y.o.   MRN: [id number]    Reason for Appointment: Goiter, followup    History of Present Illness:   The patient's thyroid enlargement was first discovered in 1992 At that time she had a right lobectomy done for a nodule that showed Hashimoto thyroiditis and also a microscopic focus of papillary carcinoma on pathology.. 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 She has had an ultrasound exam in  4/14 which showed a heterogenous left lobe without a discrete nodule and also some remnant of the right thyroid lobe Her TSH level was 0.54 and she was advised to try and leave off the levothyroxine; follow-up TSH was still low at 0.29   Her TSH is now consistently back to normal  Does not think she has any  fatigue No evidence of local discomfort or pressure  She has had no difficulty with swallowing   Does not feel like she has any choking sensation in her neck or  local pressure   Labs:  Lab Results  Component Value Date   TSH 1.03 01/24/2016   TSH 1.04 01/24/2015   TSH 0.29* 07/22/2014   FREET4 0.84 01/24/2016   FREET4 0.81 01/24/2015   FREET4 0.83 07/22/2014         Medication List       This list is accurate as of: 01/29/16  8:19 AM.  Always use your most recent med list.               amLODipine 10 MG tablet  Commonly known as:  NORVASC  Take 1 tablet (10 mg total) by mouth daily.     aspirin 81 MG chewable tablet  Chew 81 mg by mouth daily.     atorvastatin 40 MG tablet  Commonly known as:  LIPITOR  Take 1 tablet (40 mg total) by mouth daily at 6 PM.     carvedilol 25 MG tablet  Commonly known as:  COREG  Take 1 tablet (25 mg total) by mouth 2 (two) times daily with a meal.     ferrous sulfate 325 (65 FE) MG tablet  Take 325 mg by mouth daily with  breakfast.     fluticasone 50 MCG/ACT nasal spray  Commonly known as:  FLONASE  Place 2 sprays into the nose daily as needed. For allergies     glucose blood test strip  Commonly known as:  ONE TOUCH ULTRA TEST  USE TO CHECK BLOOD SUGARS IN THE MORNING AND THE EVENING     hydrochlorothiazide 25 MG tablet  Commonly known as:  HYDRODIURIL  Take 1 tablet (25 mg total) by mouth daily.     ibuprofen 200 MG tablet  Commonly known as:  ADVIL,MOTRIN  Take 200 mg by mouth every 6 (six) hours as needed for moderate pain.     insulin glargine 100 UNIT/ML injection  Commonly known as:  LANTUS  INJECT 20 UNITS SUBCUTANEOUSLY ONCE DAILY IN THE MORNING. HOLD IF GLUCOSE IS LESS THAN 70     isosorbide mononitrate 60 MG 24 hr tablet  Commonly known as:  IMDUR  Take 1 tablet (60 mg total) by mouth daily.     lisinopril 20 MG tablet  Commonly known as:  PRINIVIL,ZESTRIL  Take 2 tablets (40 mg total) by mouth daily.  metFORMIN 500 MG 24 hr tablet  Commonly known as:  GLUCOPHAGE-XR  TAKE TWO TABLETS BY MOUTH TWICE DAILY     multivitamin with minerals Tabs tablet  Take 1 tablet by mouth daily.     nitroGLYCERIN 0.4 MG SL tablet  Commonly known as:  NITROSTAT  Place 1 tablet (0.4 mg total) under the tongue every 5 (five) minutes as needed for chest pain.     ONE TOUCH ULTRA MINI w/Device Kit  Check blood sugar twice a day before meals. Diabetes Mellitus Type 2. E11.65     ONETOUCH DELICA LANCETS 33G Misc  Use to check blood sugar in the morning and the evening.     RELION INSULIN SYRINGE 1ML/31G 31G X 5/16" 1 ML Misc  Generic drug:  Insulin Syringe-Needle U-100  Use to inject insulin SQ daily as instructed.        Allergies:  Allergies  Allergen Reactions  . Codeine Nausea And Vomiting    REACTION: GI Intolerance  . Sulfamethoxazole-Trimethoprim Nausea And Vomiting    REACTION: Gi intolerence  . Tramadol Nausea And Vomiting    Past Medical History  Diagnosis Date  .  History of thyroid cancer   . ETD (eustachian tube dysfunction)   . CTS (carpal tunnel syndrome)   . Anemia   . Gastritis   . Postmenopausal   . Hypothyroid   . Cor pulmonale ([facility])   . Hypertension, benign   . Diabetes mellitus, type II ([facility])   . Hypercholesteremia   . GERD (gastroesophageal reflux disease)   . Coronary artery disease     a. Remote PTCA of the PDA;  b. 02/2009 PCI/CBA to the D1;  c. 11/2011 Cath: LM nl, LAD 20p, 79m, D1 patent, RI 20p, LCX nl, RCA 30d, EF 55-65%.  . 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 Myocardial infarction [facility])     Past Surgical History  Procedure Laterality Date  . Angioplasty      PDA 90-40 %  . Thyroidectomy Right 10-06-1991    Hashimotos and papillary carcinoma  . Cholecystectomy  08-23-2002  . Endometrial biopsy  02-20-1998  . Bartholin gland cyst excision  09-13-2004    Keratosis- no CA  . Ptca  10/23/2000 and 03/15/09  . Cardiac catheterization  03/15/2009    stenosis of diagonal branch of LAD- PTCA performed  . Coronary angioplasty    . Tonsillectomy    . Breast biopsy      LEFT SPOT REMOVED  . Tubal ligation    . Left heart catheterization with coronary angiogram N/A 11/26/2011    Procedure: LEFT HEART CATHETERIZATION WITH CORONARY ANGIOGRAM;  Surgeon: Peter M Swaziland, MD;  Location: [facility] CATH LAB;  Service: Cardiovascular;  Laterality: N/A;    Family History  Problem Relation Age of Onset  . Diabetes Mother     Deceased- hypoglycemic coma   . Hypertension Mother   . Other Father     unaware of his health history - died when pt was 75 yr old.  [name] Heart attack Sister   . Diabetes Mellitus II Brother   . Heart attack Brother   . Heart attack Brother   . Diabetes Mellitus II Brother   . Epilepsy Brother   . Breast cancer Daughter     Social History:  reports that she quit smoking about 35 years ago. Her smoking use included Cigarettes. She quit smokeless tobacco use about 21 years ago. Her  smokeless tobacco  use included Chew. She reports that she does not drink alcohol or use illicit drugs.   Review of Systems:  There is a  history of high blood pressure.              Has a  history of Diabetes, she is now on insulin and last  A1c is 8.8           Examination:   BP 124/62 mmHg  Pulse 73  Temp(Src) 97.7 F (36.5 C)  Resp 14  Ht 5' 2" (1.575 m)  Wt 158 lb (71.668 kg)  BMI 28.89 kg/m2  SpO2 93%  LMP 11/24/1992        Thyroid is palpable, the left lobe is  now smaller than the  right Left lobe is just palpable, slightly firm and smooth without nodules Right lobe is enlarged throughout including the upper pole which is relatively firm Right lobe feels irregular and is about 2 times normal without any discrete nodule No lymph nodes felt in the neck  Biceps reflexes are normal   Assessment/Plan:  History of Hashimoto thyroiditis as shown on previous surgery of the right lobe Even with her right lobe surgery this appears to be gradually enlarging She continues to be euthyroid  Since she is not symptomatic and her TSH is still very normal around 1.0 will not need any thyroid suppression or supplementation  Will check her TSH again in  12 months and follow her thyroid enlargement clinically  DIABETES: She appears to have had some progression of her diabetes with A1c nearly 9%, followed by PCP.  We will be glad to help with her control if needed  [facility] 01/29/2016

## 2016-02-07 ENCOUNTER — Other Ambulatory Visit: Payer: Self-pay | Admitting: Family Medicine

## 2016-02-26 ENCOUNTER — Telehealth: Payer: Self-pay | Admitting: Family Medicine

## 2016-02-26 NOTE — Telephone Encounter (Signed)
I contacted patient because I got a refill request for her glucometer. She stated she has had her glucometer for 3 years but it is now not functioning well. She would love to get a new meter. I completed and signed her request form. Form placed up front for faxing.   [facility] team please schedule her DM f/u appointment with me in the next 1-3 wks.

## 2016-02-26 NOTE — Telephone Encounter (Signed)
Patient scheduled for 03-01-16 at 10:45am. [facility]

## 2016-03-01 ENCOUNTER — Encounter: Payer: Self-pay | Admitting: Family Medicine

## 2016-03-01 ENCOUNTER — Ambulatory Visit (INDEPENDENT_AMBULATORY_CARE_PROVIDER_SITE_OTHER): Payer: Medicare Other | Admitting: Family Medicine

## 2016-03-01 VITALS — BP 140/54 | HR 83 | Temp 98.5°F | Wt 155.0 lb

## 2016-03-01 DIAGNOSIS — E1169 Type 2 diabetes mellitus with other specified complication: Secondary | ICD-10-CM

## 2016-03-01 DIAGNOSIS — Q845 Enlarged and hypertrophic nails: Secondary | ICD-10-CM

## 2016-03-01 DIAGNOSIS — L602 Onychogryphosis: Secondary | ICD-10-CM

## 2016-03-01 DIAGNOSIS — E1165 Type 2 diabetes mellitus with hyperglycemia: Secondary | ICD-10-CM | POA: Diagnosis not present

## 2016-03-01 DIAGNOSIS — I10 Essential (primary) hypertension: Secondary | ICD-10-CM

## 2016-03-01 LAB — POCT GLYCOSYLATED HEMOGLOBIN (HGB A1C): HEMOGLOBIN A1C: 8.4

## 2016-03-01 MED ORDER — INSULIN GLARGINE 100 UNIT/ML ~~LOC~~ SOLN: SUBCUTANEOUS | Status: DC

## 2016-03-01 NOTE — Progress Notes (Signed)
Subjective:     Patient ID: [name], female   DOB: [birth year], 75 y.o.   MRN: [id number]  HPI DM2: Here for follow up. She is compliant with Metformn 500 mg BID XL and Lantus 20 unit qd. Her home CBG in the last few days is as follows: [phone]. She denies hypoglycemic episodes. HTN:She is compliant with meds. Here for follow up. DM foot exam:Denies any pain or swelling. She has never been seen by a foot doctor.  Current Outpatient Prescriptions on File Prior to Visit  Medication Sig Dispense Refill  . amLODipine (NORVASC) 10 MG tablet TAKE ONE TABLET BY MOUTH DAILY 90 tablet 1  . aspirin 81 MG chewable tablet Chew 81 mg by mouth daily.      [name] atorvastatin (LIPITOR) 40 MG tablet Take 1 tablet (40 mg total) by mouth daily at 6 PM. 90 tablet 1  . Blood Glucose Monitoring Suppl (ONE TOUCH ULTRA MINI) W/DEVICE KIT Check blood sugar twice a day before meals. Diabetes Mellitus Type 2. E11.65 1 each 0  . carvedilol (COREG) 25 MG tablet TAKE ONE TABLET BY MOUTH TWICE DAILY WITH MEALS 180 tablet 1  . ferrous sulfate 325 (65 FE) MG tablet Take 325 mg by mouth daily with breakfast.    . fluticasone (FLONASE) 50 MCG/ACT nasal spray Place 2 sprays into the nose daily as needed. For allergies 16 g 6  . glucose blood (ONE TOUCH ULTRA TEST) test strip USE TO CHECK BLOOD SUGARS IN THE MORNING AND THE EVENING 100 each 4  . hydrochlorothiazide (HYDRODIURIL) 25 MG tablet TAKE ONE TABLET BY MOUTH DAILY 90 tablet 1  . ibuprofen (ADVIL,MOTRIN) 200 MG tablet Take 200 mg by mouth every 6 (six) hours as needed for moderate pain.    . isosorbide mononitrate (IMDUR) 60 MG 24 hr tablet TAKE ONE TABLET BY MOUTH DAILY 90 tablet 1  . lisinopril (PRINIVIL,ZESTRIL) 20 MG tablet Take 2 tablets (40 mg total) by mouth daily. 180 tablet 1  . metFORMIN (GLUCOPHAGE-XR) 500 MG 24 hr tablet TAKE TWO TABLETS BY MOUTH TWICE DAILY 180 tablet 1  . Multiple Vitamin (MULTIVITAMIN WITH MINERALS) TABS tablet Take 1 tablet by  mouth daily.    . nitroGLYCERIN (NITROSTAT) 0.4 MG SL tablet Place 1 tablet (0.4 mg total) under the tongue every 5 (five) minutes as needed for chest pain. 25 tablet 3  . ONETOUCH DELICA LANCETS 97L MISC Use to check blood sugar in the morning and the evening. 100 each 5  . RELION INSULIN SYRINGE 1ML/31G 31G X 5/16" 1 ML MISC Use to inject insulin SQ daily as instructed. 100 each 5   No current facility-administered medications on file prior to visit.   Past Medical History  Diagnosis Date  . History of thyroid cancer   . ETD (eustachian tube dysfunction)   . CTS (carpal tunnel syndrome)   . Anemia   . Gastritis   . Postmenopausal   . Hypothyroid   . Cor pulmonale ([place])   . Hypertension, benign   . Diabetes mellitus, type II ([place])   . Hypercholesteremia   . GERD (gastroesophageal reflux disease)   . Coronary artery disease     a. Remote PTCA of the PDA;  b. 02/2009 PCI/CBA to the D1;  c. 11/2011 Cath: LM nl, LAD 20p, 30m D1 patent, RI 20p, LCX nl, RCA 30d, EF 55-65%.  . 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 Myocardial infarction ([facility]  Review of Systems  Respiratory: Negative.   Cardiovascular: Negative.   Gastrointestinal: Negative.   Genitourinary: Negative.   All other systems reviewed and are negative.      Filed Vitals:   03/01/16 1112  BP: 140/54  Pulse: 83  Temp: 98.5 F (36.9 C)  TempSrc: Oral  Weight: 155 lb (70.308 kg)    Objective:   Physical Exam  Constitutional: She is oriented to person, place, and time. She appears well-developed. No distress.  Cardiovascular: Normal rate, regular rhythm, normal heart sounds and intact distal pulses.   No murmur heard. Pulmonary/Chest: Effort normal and breath sounds normal. No respiratory distress.  Musculoskeletal: Normal range of motion. She exhibits no edema.  Sensory exam of the foot is normal, tested with the monofilament. Good pulses, no lesions or ulcers,  good peripheral pulses. Toe nails are thick and long  Neurological: She is alert and oriented to person, place, and time. No cranial nerve deficit.  Nursing note and vitals reviewed.      Assessment:     Uncontrolled DM2 HTN Foot exam:      Plan:     Check problem list.

## 2016-03-01 NOTE — Assessment & Plan Note (Signed)
BP ok at 140s/80. Keep  Monitoring on current regimen.

## 2016-03-01 NOTE — Assessment & Plan Note (Signed)
Diet and exercise counseling done. Increase Lantus to 25 unit Qd. Refill given. Continue Metformin 500 BID. Continue home CBG check. F/U in 4 wks for reassessment.

## 2016-03-01 NOTE — Assessment & Plan Note (Signed)
DM foot exam completed. Normal exam other than the thickened nail. Patient referred to podiatrist for toe nail trimming.

## 2016-03-01 NOTE — Patient Instructions (Signed)
It was nice seeing you today. You glucose is still not very well controlled. Continue Metformin at 1000 mg BID and increase lantus to 25 unit daily. Please continue to check your glucose at home. Call me if it is less than 70. I will like to see you back in 4 wks.

## 2016-03-21 ENCOUNTER — Telehealth: Payer: Self-pay | Admitting: *Deleted

## 2016-03-21 NOTE — Telephone Encounter (Signed)
-----   Message from [name], MD sent at 03/21/2016  8:42 AM EDT ----- Sure if you are unable to get her to se me sooner. ----- Message -----    From: Valerie Roys, CMA    Sent: 03/21/2016   8:34 AM      To: [name], MD  Do you want me to try and get this scheduled with Lauren next week if she can do it?   ----- Message -----    From: [name], MD    Sent: 03/20/2016   3:47 PM      To: [facility]  Please contact patient to reschedule her appointment on 03/29/16 at 11 am to earlier time or another date. I will leave clinic at 10:30am that day for CME. Thank you.

## 2016-03-21 NOTE — Telephone Encounter (Signed)
Tried calling patient multiple times regarding her upcoming appt but no VM available.  Dr. Gwendlyn Deutscher has appointments earlier on the 7th if patient is able to come.  Please ask her about this when she calls back.  I will continue to try and get in touch with patient. Kattaleya Alia,CMA

## 2016-03-22 NOTE — Telephone Encounter (Signed)
Tried calling patient again but phone just continued to ring.  Will continue to try and reach patient. Konrad Hoak,CMA

## 2016-03-22 NOTE — Telephone Encounter (Signed)
Sent mychart message to try and reach patient. Jazmin Hartsell,CMA

## 2016-03-29 ENCOUNTER — Ambulatory Visit: Payer: Medicare Other | Admitting: Family Medicine

## 2016-04-01 ENCOUNTER — Ambulatory Visit (INDEPENDENT_AMBULATORY_CARE_PROVIDER_SITE_OTHER): Payer: Medicare Other

## 2016-04-01 ENCOUNTER — Encounter: Payer: Self-pay | Admitting: Podiatry

## 2016-04-01 ENCOUNTER — Ambulatory Visit (INDEPENDENT_AMBULATORY_CARE_PROVIDER_SITE_OTHER): Payer: Medicare Other | Admitting: Podiatry

## 2016-04-01 VITALS — BP 175/75 | HR 62 | Resp 18

## 2016-04-01 DIAGNOSIS — L84 Corns and callosities: Secondary | ICD-10-CM

## 2016-04-01 DIAGNOSIS — M2040 Other hammer toe(s) (acquired), unspecified foot: Secondary | ICD-10-CM | POA: Diagnosis not present

## 2016-04-01 DIAGNOSIS — M79673 Pain in unspecified foot: Secondary | ICD-10-CM

## 2016-04-01 DIAGNOSIS — R52 Pain, unspecified: Secondary | ICD-10-CM

## 2016-04-01 DIAGNOSIS — M2010 Hallux valgus (acquired), unspecified foot: Secondary | ICD-10-CM

## 2016-04-01 DIAGNOSIS — B351 Tinea unguium: Secondary | ICD-10-CM | POA: Diagnosis not present

## 2016-04-01 NOTE — Progress Notes (Signed)
   Subjective:    Patient ID: LENZIE COLUMBUS, female    DOB: 1941-02-14, 75 y.o.   MRN: [id number]  HPI  75 year old female presents the office today for concerns of a corn on the left big and 2nd toe which has been ongoing for about 4 months. Denies any redness or drainage or swelling. Also states the nails are elongated and painful. No redness or drainage around the toenail sites. As A1c was 8.4. Denies any claudication symptoms. No tingling or numbness.  Review of Systems  All other systems reviewed and are negative.      Objective:   Physical Exam General: AAO x3, NAD  Dermatological: Hyperkeratotic lesion present on the medial aspect of the second toe at the IPJ as well as the corresponding lateral hallux IPJ. Upon debridement no underlying ulceration, drainage or other signs of infection. There is no other open lesions or pre-ulcer lesions identified at this time. Nails appear to be hypertrophic, dystrophic, brittle, discolored, elongated 10. No swelling erythema or drainage. Tenderness to the  nails 1-5 bilaterally.  Vascular: Dorsalis Pedis artery and Posterior Tibial artery pedal pulses are 2/4 bilateral with immedate capillary fill time. Pedal hair growth present. No varicosities and no lower extremity edema present bilateral. There is no pain with calf compression, swelling, warmth, erythema.   Neruologic: Grossly intact via light touch bilateral. Vibratory intact via tuning fork bilateral. Protective threshold with Semmes Wienstein monofilament intact to all pedal sites bilateral. Patellar and Achilles deep tendon reflexes 2+ bilateral. No Babinski or clonus noted bilateral.   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 This is worse in the left than the right. There is no other areas of tenderness to bilateral lower extremities.  Gait: Unassisted, Nonantalgic.       Assessment & Plan:  75 year old female symptomatic onychomycosis, hyperkeratotic lesions due  to underlying digital deformities. -Treatment options discussed including all alternatives, risks, and complications -Etiology of symptoms were discussed -X-rays were obtained and reviewed with the patient. 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 -Nails sharply debrided 10 without complications or bleeding -Hyperkeratotic lesions debrided 2 without complications or bleeding -Offloading pads dispensed. -Follow-up in 3 months for routine care or sooner if any problems arise. In the meantime, encouraged to call the office with any questions, concerns, change in symptoms.   Celesta Gentile, DPM

## 2016-04-02 ENCOUNTER — Other Ambulatory Visit: Payer: Self-pay | Admitting: *Deleted

## 2016-04-02 ENCOUNTER — Ambulatory Visit: Payer: Medicare Other | Admitting: Family Medicine

## 2016-04-02 ENCOUNTER — Ambulatory Visit (INDEPENDENT_AMBULATORY_CARE_PROVIDER_SITE_OTHER): Payer: Medicare Other | Admitting: Family Medicine

## 2016-04-02 ENCOUNTER — Encounter: Payer: Self-pay | Admitting: Family Medicine

## 2016-04-02 VITALS — BP 163/60 | HR 71 | Temp 98.2°F | Ht 62.0 in | Wt 157.0 lb

## 2016-04-02 DIAGNOSIS — Z0000 Encounter for general adult medical examination without abnormal findings: Secondary | ICD-10-CM | POA: Diagnosis not present

## 2016-04-02 MED ORDER — METFORMIN HCL ER 500 MG PO TB24: 1000.0000 mg | ORAL_TABLET | Freq: Two times a day (BID) | ORAL | Status: DC

## 2016-04-02 MED ORDER — ATORVASTATIN CALCIUM 40 MG PO TABS: 40.0000 mg | ORAL_TABLET | Freq: Every day | ORAL | Status: DC

## 2016-04-02 NOTE — Progress Notes (Signed)
Patient ID: [name], female   DOB: Dec 30, 1940, 75 y.o.   MRN: [id number] Subjective:    [name] is a 75 y.o. female who presents for a welcome to Medicare exam.   Cardiac risk factors: advanced age (older than 41 for men, 11 for women), diabetes mellitus, dyslipidemia and hypertension.  Activities of Daily Living  In your present state of health, do you have any difficulty performing the following activities?:  Preparing food and eating?: No Bathing yourself: No Getting dressed: No Using the toilet:No Moving around from place to place: No In the past year have you fallen or had a near fall?:No  Current exercise habits: The patient does not participate in regular exercise at present. 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 Healthy diet.   Depression Screen (Note: if answer to either of the following is "Yes", then a more complete depression screening is indicated)  Q1: Over the past two weeks, have you felt down, depressed or hopeless?no Q2: Over the past two weeks, have you felt little interest or pleasure in doing things? no   The following portions of the patient's history were reviewed and updated as appropriate: allergies, current medications, past family history, past medical history, past social history, past surgical history and problem list. Review of Systems Pertinent items noted in HPI and remainder of comprehensive ROS otherwise negative.    Objective:     Vision by Snellen chart: right eye:20/70, left eye:20/50 B/L 20/30 ( She sees an ophthalmologist) Blood pressure 163/60, pulse 71, temperature 98.2 F (36.8 C), temperature source Oral, height 5\' 2"  (1.575 m), weight 157 lb (71.215 kg), last menstrual period 11/24/1992. Body mass index is 28.71 kg/(m^2). BP 163/60 mmHg  Pulse 71  Temp(Src) 98.2 F (36.8 C) (Oral)  Ht 5\' 2"  (1.575 m)  Wt 157 lb (71.215 kg)  BMI 28.71 kg/m2  LMP  11/24/1992 General appearance: alert, cooperative and appears stated age Head: Normocephalic, without obvious abnormality, atraumatic Eyes: conjunctivae/corneas clear. PERRL, EOM's intact. Fundi benign. Ears: normal TM's and external ear canals both ears Throat: lips, mucosa, and tongue normal; teeth and gums normal Neck: no adenopathy, no carotid bruit, no JVD, supple, symmetrical, trachea midline and thyroid not enlarged, symmetric, no tenderness/mass/nodules Lungs: clear to auscultation bilaterally Heart: regular rate and rhythm, S1, S2 normal, no murmur, click, rub or gallop Abdomen: soft, non-tender; bowel sounds normal; no masses,  no organomegaly Extremities: extremities normal, atraumatic, no cyanosis or edema Skin: Skin color, texture, turgor normal. No rashes or lesions Neurologic: Alert and oriented X 3, normal strength and tone. Normal symmetric reflexes. Normal coordination and gait Mental status: Alert, oriented, thought content appropriate Cranial nerves: normal Sensory: normal Motor: grossly normal Reflexes: 2+ and symmetric Coordination: normal Gait: Normal    Assessment:     Medicare Wellness visit      Plan:     During the course of the visit the patient was educated and counseled about appropriate screening and preventive services including:   Td vaccine  Nutrition counseling   She is uptodate with other vaccination and screening.  Patient Instructions (the written plan) was given to the patient.

## 2016-04-02 NOTE — Patient Instructions (Signed)
  [name] , Thank you for taking time to come for your Medicare Wellness Visit. I appreciate your ongoing commitment to your health goals. Please review the following plan we discussed and let me know if I can assist you in the future.   These are the goals we discussed: Goals    . Increase lean proteins     Breakfast       This is a list of the screening recommended for you and due dates:  Health Maintenance  Topic Date Due  . Tetanus Vaccine  06/28/2015  . Flu Shot  07/23/2016  . Hemoglobin A1C  09/01/2016  . Eye exam for diabetics  01/10/2017  . Complete foot exam   03/01/2017  . Mammogram  11/05/2017  . Colon Cancer Screening  07/03/2023  . DEXA scan (bone density measurement)  Completed  . Shingles Vaccine  Addressed  . Pneumonia vaccines  Completed

## 2016-04-15 ENCOUNTER — Encounter: Payer: Self-pay | Admitting: Family Medicine

## 2016-04-15 NOTE — Progress Notes (Signed)
Patient ID: [name], female   DOB: 02/19/1941, 75 y.o.   MRN: [id number] Patient dropped a brown envelop containing Coupon from [employer] for fall prevention screening. There is nothing for me to complete on this coupon form.  I have left letter up front for her to pick up.     [facility] team  Please contact patient. Let her know she is supposed to complete the coupon form herself and mail to [employer]. There is nothing for provider to complete.' Here fall assessment risk was done 04/02/16. I have included the date on the form,she can complete the check box and mail the letter out herself.

## 2016-04-15 NOTE — Progress Notes (Signed)
LM for patient that form is ready for her to pick up. Jazmin Hartsell,CMA

## 2016-04-29 ENCOUNTER — Other Ambulatory Visit: Payer: Self-pay | Admitting: *Deleted

## 2016-04-29 MED ORDER — LISINOPRIL 20 MG PO TABS: 40.0000 mg | ORAL_TABLET | Freq: Every day | ORAL | Status: DC

## 2016-07-01 ENCOUNTER — Other Ambulatory Visit: Payer: Self-pay | Admitting: Family Medicine

## 2016-07-19 ENCOUNTER — Ambulatory Visit: Payer: Medicare Other | Admitting: Podiatry

## 2016-08-06 ENCOUNTER — Encounter: Payer: Self-pay | Admitting: Family Medicine

## 2016-08-06 ENCOUNTER — Ambulatory Visit (INDEPENDENT_AMBULATORY_CARE_PROVIDER_SITE_OTHER): Payer: Medicare Other | Admitting: Family Medicine

## 2016-08-06 VITALS — BP 131/50 | HR 74 | Temp 98.8°F | Ht 62.0 in | Wt 160.0 lb

## 2016-08-06 DIAGNOSIS — H43393 Other vitreous opacities, bilateral: Secondary | ICD-10-CM

## 2016-08-06 DIAGNOSIS — E1169 Type 2 diabetes mellitus with other specified complication: Secondary | ICD-10-CM | POA: Diagnosis not present

## 2016-08-06 DIAGNOSIS — E785 Hyperlipidemia, unspecified: Secondary | ICD-10-CM | POA: Diagnosis not present

## 2016-08-06 DIAGNOSIS — I25799 Atherosclerosis of other coronary artery bypass graft(s) with unspecified angina pectoris: Secondary | ICD-10-CM

## 2016-08-06 DIAGNOSIS — H43399 Other vitreous opacities, unspecified eye: Secondary | ICD-10-CM | POA: Insufficient documentation

## 2016-08-06 DIAGNOSIS — Z79899 Other long term (current) drug therapy: Secondary | ICD-10-CM

## 2016-08-06 DIAGNOSIS — Z1321 Encounter for screening for nutritional disorder: Secondary | ICD-10-CM

## 2016-08-06 DIAGNOSIS — I10 Essential (primary) hypertension: Secondary | ICD-10-CM

## 2016-08-06 DIAGNOSIS — E1165 Type 2 diabetes mellitus with hyperglycemia: Secondary | ICD-10-CM | POA: Diagnosis not present

## 2016-08-06 LAB — BASIC METABOLIC PANEL WITH GFR
BUN: 15 mg/dL (ref 7–25)
CALCIUM: 10.3 mg/dL (ref 8.6–10.4)
CHLORIDE: 102 mmol/L (ref 98–110)
CO2: 33 mmol/L — ABNORMAL HIGH (ref 20–31)
Creat: 0.84 mg/dL (ref 0.60–0.93)
GFR, EST AFRICAN AMERICAN: 80 mL/min (ref >=60)
GFR, Est Non African American: 69 mL/min (ref >=60)
Glucose, Bld: 83 mg/dL (ref 65–99)
Potassium: 4.3 mmol/L (ref 3.5–5.3)
Sodium: 143 mmol/L (ref 135–146)

## 2016-08-06 LAB — LIPID PANEL
CHOLESTEROL: 111 mg/dL — AB (ref 125–200)
HDL: 50 mg/dL (ref >=46)
LDL Cholesterol: 37 mg/dL (ref <130)
TRIGLYCERIDES: 120 mg/dL (ref <150)
Total CHOL/HDL Ratio: 2.2 Ratio (ref <=5.0)
VLDL: 24 mg/dL (ref <30)

## 2016-08-06 LAB — POCT GLYCOSYLATED HEMOGLOBIN (HGB A1C): HEMOGLOBIN A1C: 7.2

## 2016-08-06 LAB — VITAMIN B12: VITAMIN B 12: 454 pg/mL (ref 200–1100)

## 2016-08-06 NOTE — Assessment & Plan Note (Signed)
Currently asymptomatic. Referral ophthalmologist done. Return precaution discussed.

## 2016-08-06 NOTE — Patient Instructions (Signed)
Nice to see you today. Your glucose seems to be getting better. We will continue the same medication for now. I will see you back in 3-4 months.

## 2016-08-06 NOTE — Assessment & Plan Note (Addendum)
FLP checked. Continue current regimen. Will discuss Statin based on result. Continue ASA.

## 2016-08-06 NOTE — Assessment & Plan Note (Signed)
BP ok. Continue current regimen. Bmet checked today.

## 2016-08-06 NOTE — Progress Notes (Signed)
Subjective:     Patient ID: [name], female   DOB: [birth year], 75 y.o.   MRN: [id number]  HPI DM2: Here for follow up. She has been compliant with meds. Her home CBC ranges are: 112,123,114,93,123,208,88. Denies hypoglycemic episodes. Here for follow up. HTN/HLD/CAD: Compliant with meds. No new concern. Eye issue: She stated last week she started seeing flashes in her eyes, she denies vision loss, no pain, no redness, no eye discharge. She is just concern about this. This happened one time. No other concern.  Current Outpatient Prescriptions on File Prior to Visit  Medication Sig Dispense Refill  . amLODipine (NORVASC) 10 MG tablet TAKE ONE TABLET BY MOUTH DAILY 90 tablet 1  . aspirin 81 MG chewable tablet Chew 81 mg by mouth daily.      [name] atorvastatin (LIPITOR) 40 MG tablet Take 1 tablet (40 mg total) by mouth daily at 6 PM. 90 tablet 2  . carvedilol (COREG) 25 MG tablet TAKE ONE TABLET BY MOUTH TWICE DAILY WITH MEALS 180 tablet 1  . ferrous sulfate 325 (65 FE) MG tablet Take 325 mg by mouth daily with breakfast.    . hydrochlorothiazide (HYDRODIURIL) 25 MG tablet TAKE ONE TABLET BY MOUTH DAILY 90 tablet 1  . insulin glargine (LANTUS) 100 UNIT/ML injection INJECT 25 UNITS SUBCUTANEOUSLY ONCE DAILY IN THE MORNING. HOLD IF GLUCOSE IS LESS THAN 70 10 mL 5  . isosorbide mononitrate (IMDUR) 60 MG 24 hr tablet TAKE ONE TABLET BY MOUTH DAILY 90 tablet 1  . lisinopril (PRINIVIL,ZESTRIL) 20 MG tablet Take 2 tablets (40 mg total) by mouth daily. 180 tablet 1  . metFORMIN (GLUCOPHAGE-XR) 500 MG 24 hr tablet Take 2 tablets (1,000 mg total) by mouth 2 (two) times daily. 180 tablet 2  . Multiple Vitamin (MULTIVITAMIN WITH MINERALS) TABS tablet Take 1 tablet by mouth daily. Reported on 04/02/2016    . B-D INS SYR ULTRAFINE 1CC/31G 31G X 5/16" 1 ML MISC USE TO INJECT INSULIN SUBCUTANEOUSLY DAILY AS INSTRUCTED 100 each 11  . Blood Glucose Monitoring Suppl (ONE TOUCH ULTRA MINI) W/DEVICE KIT Check  blood sugar twice a day before meals. Diabetes Mellitus Type 2. E11.65 1 each 0  . fluticasone (FLONASE) 50 MCG/ACT nasal spray Place 2 sprays into the nose daily as needed. For allergies (Patient not taking: Reported on 08/06/2016) 16 g 6  . glucose blood (ONE TOUCH ULTRA TEST) test strip USE TO CHECK BLOOD SUGARS IN THE MORNING AND THE EVENING 100 each 4  . ibuprofen (ADVIL,MOTRIN) 200 MG tablet Take 200 mg by mouth every 6 (six) hours as needed for moderate pain.    . nitroGLYCERIN (NITROSTAT) 0.4 MG SL tablet Place 1 tablet (0.4 mg total) under the tongue every 5 (five) minutes as needed for chest pain. (Patient not taking: Reported on 04/02/2016) 25 tablet 3  . ONETOUCH DELICA LANCETS 74B MISC Use to check blood sugar in the morning and the evening. 100 each 5   No current facility-administered medications on file prior to visit.    Past Medical History:  Diagnosis Date  . Anemia   . Cor pulmonale ([place])   . Coronary artery disease    a. Remote PTCA of the PDA;  b. 02/2009 PCI/CBA to the D1;  c. 11/2011 Cath: LM nl, LAD 20p, 68m D1 patent, RI 20p, LCX nl, RCA 30d, EF 55-65%.  . CTS (carpal tunnel syndrome)   . Diabetes mellitus, type II ([place]   . ETD (eustachian tube dysfunction)   .  Gastritis   . GERD (gastroesophageal reflux disease)   . 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 History of thyroid cancer   . Hypercholesteremia   . Hypertension, benign   . Hypothyroid   . Myocardial infarction ([place])   . Postmenopausal      Review of Systems  Eyes: Negative for photophobia, pain, discharge, redness, itching and visual disturbance.       Light flashes in her eyes  Respiratory: Negative.   Cardiovascular: Negative.   Gastrointestinal: Negative.   Musculoskeletal: Negative.   Neurological: Negative.   All other systems reviewed and are negative.  Vitals:   08/06/16 1056  BP: (!) 131/50  Pulse: 74  Temp: 98.8 F (37.1 C)  TempSrc:  Oral  Weight: 160 lb (72.6 kg)  Height: '5\' 2"'  (1.575 m)       Objective:   Physical Exam  Constitutional: She is oriented to person, place, and time. She appears well-developed. No distress.  HENT:  Head: Normocephalic.  Eyes: Conjunctivae and EOM are normal. Pupils are equal, round, and reactive to light. Right eye exhibits no discharge. Left eye exhibits no discharge. No scleral icterus.  Neck: Neck supple.  Cardiovascular: Normal rate, regular rhythm and normal heart sounds.   No murmur heard. Pulmonary/Chest: Effort normal and breath sounds normal. No respiratory distress. She has no wheezes.  Abdominal: Soft. Bowel sounds are normal. She exhibits no distension and no mass. There is no tenderness. There is no rebound.  Musculoskeletal: Normal range of motion. She exhibits no edema.  Neurological: She is alert and oriented to person, place, and time. No cranial nerve deficit.  Nursing note and vitals reviewed.      Assessment:     DM2 HTN HLD/CAD Flashes in her vision    Plan:     Check problem list.

## 2016-08-06 NOTE — Assessment & Plan Note (Signed)
A1C improved a lot. Continue current regimen. Continue diet and exercise.

## 2016-08-07 ENCOUNTER — Telehealth: Payer: Self-pay | Admitting: Family Medicine

## 2016-08-07 MED ORDER — ATORVASTATIN CALCIUM 20 MG PO TABS: 20.0000 mg | ORAL_TABLET | Freq: Every day | ORAL | 2 refills | Status: DC

## 2016-08-07 NOTE — Telephone Encounter (Signed)
I discussed lab result with her. Looks good. Here FLP a little too good with LDL of 37. I recommended cutting back on her Statin from 40 to 20 as too low LDL might be unsafe in this age group as well. She verbalized understanding.

## 2016-08-16 ENCOUNTER — Other Ambulatory Visit: Payer: Self-pay | Admitting: Family Medicine

## 2016-09-19 ENCOUNTER — Other Ambulatory Visit: Payer: Self-pay | Admitting: Family Medicine

## 2016-10-02 ENCOUNTER — Other Ambulatory Visit: Payer: Self-pay | Admitting: Family Medicine

## 2016-10-02 DIAGNOSIS — Z1231 Encounter for screening mammogram for malignant neoplasm of breast: Secondary | ICD-10-CM

## 2016-11-03 ENCOUNTER — Other Ambulatory Visit: Payer: Self-pay | Admitting: Family Medicine

## 2016-11-05 ENCOUNTER — Encounter: Payer: Self-pay | Admitting: Family Medicine

## 2016-11-05 ENCOUNTER — Ambulatory Visit (INDEPENDENT_AMBULATORY_CARE_PROVIDER_SITE_OTHER): Payer: Medicare Other | Admitting: Family Medicine

## 2016-11-05 VITALS — BP 156/59 | HR 68 | Temp 98.3°F | Ht 62.0 in | Wt 161.0 lb

## 2016-11-05 DIAGNOSIS — Z23 Encounter for immunization: Secondary | ICD-10-CM

## 2016-11-05 DIAGNOSIS — Z0000 Encounter for general adult medical examination without abnormal findings: Secondary | ICD-10-CM | POA: Insufficient documentation

## 2016-11-05 DIAGNOSIS — E1165 Type 2 diabetes mellitus with hyperglycemia: Secondary | ICD-10-CM

## 2016-11-05 DIAGNOSIS — I10 Essential (primary) hypertension: Secondary | ICD-10-CM

## 2016-11-05 DIAGNOSIS — E1169 Type 2 diabetes mellitus with other specified complication: Secondary | ICD-10-CM

## 2016-11-05 LAB — POCT GLYCOSYLATED HEMOGLOBIN (HGB A1C): Hemoglobin A1C: 7.8

## 2016-11-05 MED ORDER — LISINOPRIL-HYDROCHLOROTHIAZIDE 20-12.5 MG PO TABS: 2.0000 | ORAL_TABLET | Freq: Every day | ORAL | 2 refills | Status: DC

## 2016-11-05 MED ORDER — TETANUS-DIPHTH-ACELL PERTUSSIS 5-2.5-18.5 LF-MCG/0.5 IM SUSP: 0.5000 mL | Freq: Once | INTRAMUSCULAR | 0 refills | Status: AC

## 2016-11-05 NOTE — Progress Notes (Signed)
Subjective:     Patient ID: [name], female   DOB: Jan 22, 1941, 75 y.o.   MRN: [id number]  HPI DM2: Here for follow up. She has been taking Lantus 20 units qd instead of 25 units as instructed. She stated she forgot her regimen instruction. Her CBG at home ranges from 127 to 150s. Denies hypoglycemic episodes.  HTN: She is compliant with all meds except Norvasc. She stated she is unaware she is to continue meds. She did not pick up her prescription from the pharmacy either for this medication. NW:GNFA for follow up.  Current Outpatient Prescriptions on File Prior to Visit  Medication Sig Dispense Refill  . aspirin 81 MG chewable tablet Chew 81 mg by mouth daily.      [name] atorvastatin (LIPITOR) 20 MG tablet Take 1 tablet (20 mg total) by mouth daily at 6 PM. 90 tablet 2  . B-D INS SYR ULTRAFINE 1CC/31G 31G X 5/16" 1 ML MISC USE TO INJECT INSULIN SUBCUTANEOUSLY DAILY AS INSTRUCTED 100 each 11  . Blood Glucose Monitoring Suppl (ONE TOUCH ULTRA MINI) W/DEVICE KIT Check blood sugar twice a day before meals. Diabetes Mellitus Type 2. E11.65 1 each 0  . carvedilol (COREG) 25 MG tablet TAKE ONE TABLET BY MOUTH TWICE DAILY WITH MEALS 180 tablet 1  . Garlic 2130 MG CAPS Take 1,000 mg by mouth daily.    [name] glucose blood (ONE TOUCH ULTRA TEST) test strip USE TO CHECK BLOOD SUGARS IN THE MORNING AND THE EVENING 100 each 4  . hydrochlorothiazide (HYDRODIURIL) 25 MG tablet TAKE ONE TABLET BY MOUTH DAILY 90 tablet 1  . insulin glargine (LANTUS) 100 UNIT/ML injection INJECT 25 UNITS SUBCUTANEOUSLY ONCE DAILY IN THE MORNING. HOLD IF GLUCOSE IS LESS THAN 70 10 mL 5  . isosorbide mononitrate (IMDUR) 60 MG 24 hr tablet TAKE ONE TABLET BY MOUTH DAILY 90 tablet 1  . lisinopril (PRINIVIL,ZESTRIL) 20 MG tablet TAKE TWO TABLETS BY MOUTH DAILY 180 tablet 0  . metFORMIN (GLUCOPHAGE-XR) 500 MG 24 hr tablet TAKE TWO TABLETS BY MOUTH TWICE DAILY 180 tablet 1  . Multiple Vitamin (MULTIVITAMIN WITH MINERALS) TABS tablet  Take 1 tablet by mouth daily. Reported on 04/02/2016    . ONETOUCH DELICA LANCETS 86V MISC Use to check blood sugar in the morning and the evening. 100 each 5  . amLODipine (NORVASC) 10 MG tablet TAKE ONE TABLET BY MOUTH DAILY (Patient not taking: Reported on 11/05/2016) 90 tablet 1  . ferrous sulfate 325 (65 FE) MG tablet Take 325 mg by mouth daily with breakfast.    . fluticasone (FLONASE) 50 MCG/ACT nasal spray Place 2 sprays into the nose daily as needed. For allergies (Patient not taking: Reported on 11/05/2016) 16 g 6  . ibuprofen (ADVIL,MOTRIN) 200 MG tablet Take 200 mg by mouth every 6 (six) hours as needed for moderate pain.    [name] loratadine (CLARITIN) 10 MG tablet Take 10 mg by mouth daily.    . nitroGLYCERIN (NITROSTAT) 0.4 MG SL tablet Place 1 tablet (0.4 mg total) under the tongue every 5 (five) minutes as needed for chest pain. (Patient not taking: Reported on 11/05/2016) 25 tablet 3   No current facility-administered medications on file prior to visit.    Past Medical History:  Diagnosis Date  . Anemia   . Cor pulmonale ([place])   . Coronary artery disease    a. Remote PTCA of the PDA;  b. 02/2009 PCI/CBA to the D1;  c. 11/2011 Cath: LM nl, LAD  20p, 77m D1 patent, RI 20p, LCX nl, RCA 30d, EF 55-65%.  . CTS (carpal tunnel syndrome)   . Diabetes mellitus, type II ([place]   . ETD (eustachian tube dysfunction)   . Gastritis   . GERD (gastroesophageal reflux disease)   . 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 History of thyroid cancer   . Hypercholesteremia   . Hypertension, benign   . Hypothyroid   . Myocardial infarction   . Postmenopausal      Review of Systems  Respiratory: Negative.   Cardiovascular: Negative.   Gastrointestinal: Negative.   Genitourinary: Negative.   Neurological: Negative.   All other systems reviewed and are negative.  Vitals:   11/05/16 1053  BP: (!) 156/59  Pulse: 68  Temp: 98.3 F (36.8 C)   TempSrc: Oral  Weight: 161 lb (73 kg)  Height: _0  (1.575 m)       Objective:   Physical Exam  Constitutional: She is oriented to person, place, and time. She appears well-developed. No distress.  Cardiovascular: Normal rate, regular rhythm and normal heart sounds.   No murmur heard. Pulmonary/Chest: Effort normal and breath sounds normal. No respiratory distress. She has no wheezes.  Abdominal: Soft. Bowel sounds are normal. She exhibits no distension and no mass. There is no tenderness.  Musculoskeletal: Normal range of motion. She exhibits no edema.  Neurological: She is alert and oriented to person, place, and time. No cranial nerve deficit.  Nursing note and vitals reviewed.      Assessment:     DM2: HTN: HM:    Plan:     Check problem list

## 2016-11-05 NOTE — Patient Instructions (Addendum)
Here are your medications for  HTN 1. Coreg 25mg  BID, HCTZ/Lisinoprl 25/20mg  qd,Imdur 60 mg qd, Amlodipine 10 mg qd  DM medication Lantus 25 units daily, Metformin 500mg  2 tablet BID    Diabetes Mellitus and Food It is important for you to manage your blood sugar (glucose) level. Your blood glucose level can be greatly affected by what you eat. Eating healthier foods in the appropriate amounts throughout the day at about the same time each day will help you control your blood glucose level. It can also help slow or prevent worsening of your diabetes mellitus. Healthy eating may even help you improve the level of your blood pressure and reach or maintain a healthy weight. General recommendations for healthful eating and cooking habits include:  Eating meals and snacks regularly. Avoid going long periods of time without eating to lose weight.  Eating a diet that consists mainly of plant-based foods, such as fruits, vegetables, nuts, legumes, and whole grains.  Using low-heat cooking methods, such as baking, instead of high-heat cooking methods, such as deep frying. Work with your dietitian to make sure you understand how to use the Nutrition Facts information on food labels. How can food affect me? Carbohydrates  Carbohydrates affect your blood glucose level more than any other type of food. Your dietitian will help you determine how many carbohydrates to eat at each meal and teach you how to count carbohydrates. Counting carbohydrates is important to keep your blood glucose at a healthy level, especially if you are using insulin or taking certain medicines for diabetes mellitus. Alcohol  Alcohol can cause sudden decreases in blood glucose (hypoglycemia), especially if you use insulin or take certain medicines for diabetes mellitus. Hypoglycemia can be a life-threatening condition. Symptoms of hypoglycemia (sleepiness, dizziness, and disorientation) are similar to symptoms of having too much  alcohol. If your health care provider has given you approval to drink alcohol, do so in moderation and use the following guidelines:  Women should not have more than one drink per day, and men should not have more than two drinks per day. One drink is equal to:  12 oz of beer.  5 oz of wine.  1 oz of hard liquor.  Do not drink on an empty stomach.  Keep yourself hydrated. Have water, diet soda, or unsweetened iced tea.  Regular soda, juice, and other mixers might contain a lot of carbohydrates and should be counted. What foods are not recommended? As you make food choices, it is important to remember that all foods are not the same. Some foods have fewer nutrients per serving than other foods, even though they might have the same number of calories or carbohydrates. It is difficult to get your body what it needs when you eat foods with fewer nutrients. Examples of foods that you should avoid that are high in calories and carbohydrates but low in nutrients include:  Trans fats (most processed foods list trans fats on the Nutrition Facts label).  Regular soda.  Juice.  Candy.  Sweets, such as cake, pie, doughnuts, and cookies.  Fried foods. What foods can I eat? Eat nutrient-rich foods, which will nourish your body and keep you healthy. The food you should eat also will depend on several factors, including:  The calories you need.  The medicines you take.  Your weight.  Your blood glucose level.  Your blood pressure level.  Your cholesterol level. You should eat a variety of foods, including:  Protein.  Lean cuts of meat.  Proteins low in saturated fats, such as fish, egg whites, and beans. Avoid processed meats.  Fruits and vegetables.  Fruits and vegetables that may help control blood glucose levels, such as apples, mangoes, and yams.  Dairy products.  Choose fat-free or low-fat dairy products, such as milk, yogurt, and cheese.  Grains, bread, pasta, and  rice.  Choose whole grain products, such as multigrain bread, whole oats, and brown rice. These foods may help control blood pressure.  Fats.  Foods containing healthful fats, such as nuts, avocado, olive oil, canola oil, and fish. Does everyone with diabetes mellitus have the same meal plan? Because every person with diabetes mellitus is different, there is not one meal plan that works for everyone. It is very important that you meet with a dietitian who will help you create a meal plan that is just right for you. This information is not intended to replace advice given to you by your health care provider. Make sure you discuss any questions you have with your health care provider. Document Released: 09/05/2005 Document Revised: 05/16/2016 Document Reviewed: 11/05/2013 Elsevier Interactive Patient Education  2017 [employer].

## 2016-11-05 NOTE — Assessment & Plan Note (Signed)
Flu shot given today. Script for Tdap given. F/U as needed.

## 2016-11-05 NOTE — Assessment & Plan Note (Signed)
BP slightly elevated. Patient advised to pick up her Norvasc from the pharmacy. I also switched her from HCTZ 25 mg qd and Lisinopril 40 mg qd to HCTZ/Lisinopril 12.5/20 mg BID. Continue home BP monitoring. F/U with me in 2 wks for reassessment. She read back medication instruction to me. She took the separate HCTZ and Lisinopril out of her pill box to avoid medication confusion.

## 2016-11-05 NOTE — Assessment & Plan Note (Signed)
A1C increased from 7.2 to 7.8. Medication adherence discussed. Patient will continue appropriate dose of her Lantus at 25 units qd. Continue home CBG monitoring. F/U in 2 wks for reassessment.

## 2016-11-07 ENCOUNTER — Ambulatory Visit
Admission: RE | Admit: 2016-11-07 | Discharge: 2016-11-07 | Disposition: A | Discharge status: Home / Self Care | Payer: Medicare Other | Source: Ambulatory Visit | Attending: Family Medicine | Admitting: Family Medicine

## 2016-11-07 DIAGNOSIS — Z1231 Encounter for screening mammogram for malignant neoplasm of breast: Secondary | ICD-10-CM

## 2016-11-19 ENCOUNTER — Ambulatory Visit: Payer: Medicare Other | Admitting: Family Medicine

## 2016-11-22 ENCOUNTER — Other Ambulatory Visit: Payer: Self-pay | Admitting: Pharmacist

## 2016-11-22 NOTE — Patient Outreach (Signed)
Outreach call to [employer] regarding her request for follow up from the [facility] Medication Adherence Campaign. Call patient; phone rings, but no voicemail is available.  Harlow Asa, PharmD Clinical Pharmacist [facility] [phone]

## 2016-11-26 ENCOUNTER — Ambulatory Visit (INDEPENDENT_AMBULATORY_CARE_PROVIDER_SITE_OTHER): Payer: Medicare Other | Admitting: Family Medicine

## 2016-11-26 ENCOUNTER — Encounter: Payer: Self-pay | Admitting: Family Medicine

## 2016-11-26 DIAGNOSIS — E1169 Type 2 diabetes mellitus with other specified complication: Secondary | ICD-10-CM | POA: Diagnosis not present

## 2016-11-26 DIAGNOSIS — I10 Essential (primary) hypertension: Secondary | ICD-10-CM

## 2016-11-26 DIAGNOSIS — E1165 Type 2 diabetes mellitus with hyperglycemia: Secondary | ICD-10-CM

## 2016-11-26 NOTE — Progress Notes (Signed)
Subjective:     Patient ID: [name], female   DOB: Jun 25, 1941, 75 y.o.   MRN: [id number]  HPI HTN: Here for follow up. She has been compliant with all her BP meds , last dose was taken this morning. She is currently on Norvasc 10 mg qd, Coreg 125 mg, Imdur 60 mg qd, BID and Zestoretic 20/12.5 mg BID. Her home BP ranges from 144/70  145/80. She denies any symptoms. DM2:She is now compliant with her DM regimen. Denies hypoglycemic episodes. Here for follow up.  Current Outpatient Prescriptions on File Prior to Visit  Medication Sig Dispense Refill  . amLODipine (NORVASC) 10 MG tablet TAKE ONE TABLET BY MOUTH DAILY 90 tablet 1  . aspirin 81 MG chewable tablet Chew 81 mg by mouth daily.      [name] atorvastatin (LIPITOR) 20 MG tablet Take 1 tablet (20 mg total) by mouth daily at 6 PM. 90 tablet 2  . carvedilol (COREG) 25 MG tablet TAKE ONE TABLET BY MOUTH TWICE DAILY WITH MEALS 180 tablet 1  . ferrous sulfate 325 (65 FE) MG tablet Take 325 mg by mouth daily with breakfast.    . insulin glargine (LANTUS) 100 UNIT/ML injection INJECT 25 UNITS SUBCUTANEOUSLY ONCE DAILY IN THE MORNING. HOLD IF GLUCOSE IS LESS THAN 70 10 mL 5  . isosorbide mononitrate (IMDUR) 60 MG 24 hr tablet TAKE ONE TABLET BY MOUTH DAILY 90 tablet 1  . lisinopril-hydrochlorothiazide (ZESTORETIC) 20-12.5 MG tablet Take 2 tablets by mouth daily. 180 tablet 2  . metFORMIN (GLUCOPHAGE-XR) 500 MG 24 hr tablet TAKE TWO TABLETS BY MOUTH TWICE DAILY 180 tablet 1  . B-D INS SYR ULTRAFINE 1CC/31G 31G X 5/16" 1 ML MISC USE TO INJECT INSULIN SUBCUTANEOUSLY DAILY AS INSTRUCTED 100 each 11  . Blood Glucose Monitoring Suppl (ONE TOUCH ULTRA MINI) W/DEVICE KIT Check blood sugar twice a day before meals. Diabetes Mellitus Type 2. E11.65 1 each 0  . fluticasone (FLONASE) 50 MCG/ACT nasal spray Place 2 sprays into the nose daily as needed. For allergies (Patient not taking: Reported on 11/05/2016) 16 g 6  . Garlic 8242 MG CAPS Take 1,000 mg by  mouth daily.    [name] glucose blood (ONE TOUCH ULTRA TEST) test strip USE TO CHECK BLOOD SUGARS IN THE MORNING AND THE EVENING 100 each 4  . ibuprofen (ADVIL,MOTRIN) 200 MG tablet Take 200 mg by mouth every 6 (six) hours as needed for moderate pain.    [name] loratadine (CLARITIN) 10 MG tablet Take 10 mg by mouth daily.    . Multiple Vitamin (MULTIVITAMIN WITH MINERALS) TABS tablet Take 1 tablet by mouth daily. Reported on 04/02/2016    . nitroGLYCERIN (NITROSTAT) 0.4 MG SL tablet Place 1 tablet (0.4 mg total) under the tongue every 5 (five) minutes as needed for chest pain. (Patient not taking: Reported on 11/05/2016) 25 tablet 3  . ONETOUCH DELICA LANCETS 35T MISC Use to check blood sugar in the morning and the evening. 100 each 5   No current facility-administered medications on file prior to visit.    Past Medical History:  Diagnosis Date  . Anemia   . Cor pulmonale ([place])   . Coronary artery disease    a. Remote PTCA of the PDA;  b. 02/2009 PCI/CBA to the D1;  c. 11/2011 Cath: LM nl, LAD 20p, 30m D1 patent, RI 20p, LCX nl, RCA 30d, EF 55-65%.  . CTS (carpal tunnel syndrome)   . Diabetes [place] II [place]   .  ETD (eustachian tube dysfunction)   . Gastritis   . GERD (gastroesophageal reflux disease)   . 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 History of thyroid cancer   . Hypercholesteremia   . Hypertension, benign   . Hypothyroid   . Myocardial infarction   . Postmenopausal      Review of Systems  Respiratory: Negative.   Cardiovascular: Negative.   Gastrointestinal: Negative.   Genitourinary: Negative.   Neurological: Negative.   All other systems reviewed and are negative.      Vitals:   11/26/16 1024 11/26/16 1035 11/26/16 1036  BP: (!) 150/74 134/60 (!) 122/54  Pulse: 64    Temp: 97.8 F (36.6 C)    TempSrc: Oral    SpO2: 99%    Weight: 158 lb 6.4 oz (71.8 kg)      Objective:   Physical Exam  Constitutional: She is  oriented to person, place, and time. She appears well-developed. No distress.  Cardiovascular: Normal rate, regular rhythm and normal heart sounds.   No murmur heard. Pulmonary/Chest: Effort normal and breath sounds normal. No respiratory distress. She has no wheezes.  Abdominal: Soft. Bowel sounds are normal. She exhibits no distension and no mass. There is no tenderness.  Musculoskeletal: Normal range of motion.  Neurological: She is alert and oriented to person, place, and time.  Nursing note and vitals reviewed.      Assessment:     HTN DM2    Plan:     Check problem list.

## 2016-11-26 NOTE — Assessment & Plan Note (Signed)
BP initially elevated but improved after recheck. BP rechecked twice by me. For now will continue same regimen. F/U in 2 months for reassessment or sooner if BP is consistently elevated.

## 2016-11-26 NOTE — Patient Instructions (Signed)
It was nice seeing you today. I am fine with your BP currently. Please continue current medication. I will like to see you back in 2 months. Continue BP check at home. Call me sooner if you have any concern.

## 2016-11-26 NOTE — Assessment & Plan Note (Signed)
Now compliant with regimen. No change in medications today. A1C due in Feb. Continue home CBG check. F/U soon if having any concern, otherwise I will reassess her in Feb.

## 2016-12-16 ENCOUNTER — Other Ambulatory Visit: Payer: Self-pay | Admitting: Family Medicine

## 2017-01-24 ENCOUNTER — Other Ambulatory Visit (INDEPENDENT_AMBULATORY_CARE_PROVIDER_SITE_OTHER): Payer: Medicare Other

## 2017-01-24 DIAGNOSIS — E063 Autoimmune thyroiditis: Secondary | ICD-10-CM

## 2017-01-24 LAB — T4, FREE: Free T4: 0.86 ng/dL (ref 0.60–1.60)

## 2017-01-24 LAB — TSH: TSH: 0.77 u[IU]/mL (ref 0.35–4.50)

## 2017-01-28 ENCOUNTER — Encounter: Payer: Self-pay | Admitting: Endocrinology

## 2017-01-28 ENCOUNTER — Ambulatory Visit (INDEPENDENT_AMBULATORY_CARE_PROVIDER_SITE_OTHER): Payer: Medicare Other | Admitting: Endocrinology

## 2017-01-28 VITALS — BP 118/50 | HR 60 | Temp 98.7°F | Resp 16 | Ht 62.0 in | Wt 159.2 lb

## 2017-01-28 DIAGNOSIS — IMO0001 Reserved for inherently not codable concepts without codable children: Secondary | ICD-10-CM

## 2017-01-28 DIAGNOSIS — E063 Autoimmune thyroiditis: Secondary | ICD-10-CM

## 2017-01-28 DIAGNOSIS — E1165 Type 2 diabetes mellitus with hyperglycemia: Secondary | ICD-10-CM | POA: Diagnosis not present

## 2017-01-28 LAB — POCT GLYCOSYLATED HEMOGLOBIN (HGB A1C): HEMOGLOBIN A1C: 7

## 2017-01-28 NOTE — Progress Notes (Signed)
Patient ID: [name], female   DOB: June 30, 1941, 76 y.o.   MRN: [id number]    Reason for Appointment: Goiter, followup    History of Present Illness:   The patient's thyroid enlargement was first discovered in 1992 At that time she had a right lobectomy done for a nodule that showed Hashimoto thyroiditis and also a microscopic focus of papillary carcinoma on pathology.. Since then she had been on thyroid suppression  She was seen in consultation initially in 2014 and was felt to have a left-sided thyroid enlargement related to [place] thyroiditis She has had an ultrasound exam in  03/2013 which showed a heterogenous left lobe without a discrete nodule and also some remnant of the right thyroid lobe Her TSH level was 0.54 and she was advised to  leave off the levothyroxine  She is now here for regular follow-up She feels well subjectively Her TSH is now consistently  normal  No evidence of local discomfort or pressure sensation She has had minimal occasional difficulty with swallowing    Labs:  Lab Results  Component Value Date   TSH 0.77 01/24/2017   TSH 1.03 01/24/2016   TSH 1.04 01/24/2015   FREET4 0.86 01/24/2017   FREET4 0.84 01/24/2016   FREET4 0.81 01/24/2015       Allergies as of 01/28/2017      Reactions   Codeine Nausea And Vomiting   REACTION: GI Intolerance   Sulfamethoxazole-trimethoprim Nausea And Vomiting   REACTION: Gi intolerence   Tramadol Nausea And Vomiting      Medication List       Accurate as of 01/28/17 10:24 AM. Always use your most recent med list.          amLODipine 10 MG tablet Commonly known as:  NORVASC TAKE ONE TABLET BY MOUTH DAILY   aspirin 81 MG chewable tablet Chew 81 mg by mouth daily.   atorvastatin 20 MG tablet Commonly known as:  LIPITOR Take 1 tablet (20 mg total) by mouth daily at 6 PM.   B-D INS SYR ULTRAFINE 1CC/31G 31G X 5/16" 1 ML Misc Generic drug:  Insulin Syringe-Needle U-100 USE TO  INJECT INSULIN SUBCUTANEOUSLY DAILY AS INSTRUCTED   carvedilol 25 MG tablet Commonly known as:  COREG TAKE ONE TABLET BY MOUTH TWICE DAILY WITH MEALS   ferrous sulfate 325 (65 FE) MG tablet Take 325 mg by mouth daily with breakfast.   fluticasone 50 MCG/ACT nasal spray Commonly known as:  FLONASE Place 2 sprays into the nose daily as needed. For allergies   Garlic 0174 MG Caps Take 1,000 mg by mouth daily.   glucose blood test strip Commonly known as:  ONE TOUCH ULTRA TEST USE TO CHECK BLOOD SUGARS IN THE MORNING AND THE EVENING   ibuprofen 200 MG tablet Commonly known as:  ADVIL,MOTRIN Take 200 mg by mouth every 6 (six) hours as needed for moderate pain.   isosorbide mononitrate 60 MG 24 hr tablet Commonly known as:  IMDUR TAKE ONE TABLET BY MOUTH DAILY   LANTUS 100 UNIT/ML injection Generic drug:  insulin glargine INJECT 25 UNITS SUBCUTANEOUSLY ONCE DAILY IN THE MORNING.  HOLD IF GLUCOSE IS LESS THAN 70   lisinopril-hydrochlorothiazide 20-12.5 MG tablet Commonly known as:  ZESTORETIC Take 2 tablets by mouth daily.   loratadine 10 MG tablet Commonly known as:  CLARITIN Take 10 mg by mouth daily.   metFORMIN 500 MG 24 hr tablet Commonly known as:  GLUCOPHAGE-XR TAKE TWO TABLETS BY MOUTH  TWICE DAILY   multivitamin with minerals Tabs tablet Take 1 tablet by mouth daily. Reported on 04/02/2016   nitroGLYCERIN 0.4 MG SL tablet Commonly known as:  NITROSTAT Place 1 tablet (0.4 mg total) under the tongue every 5 (five) minutes as needed for chest pain.   ONE TOUCH ULTRA MINI w/Device Kit Check blood sugar twice a day before meals. Diabetes Mellitus Type 2. Z56.38   ONETOUCH DELICA LANCETS 75I Misc Use to check blood sugar in the morning and the evening.       Allergies:  Allergies  Allergen Reactions  . Codeine Nausea And Vomiting    REACTION: GI Intolerance  . Sulfamethoxazole-Trimethoprim Nausea And Vomiting    REACTION: Gi intolerence  . Tramadol Nausea  And Vomiting    Past Medical History:  Diagnosis Date  . Anemia   . Cor pulmonale ([place])   . Coronary artery disease    a. Remote PTCA of the PDA;  b. 02/2009 PCI/CBA to the D1;  c. 11/2011 Cath: LM nl, LAD 20p, 49m D1 patent, RI 20p, LCX nl, RCA 30d, EF 55-65%.  . CTS (carpal tunnel syndrome)   . Diabetes mellitus, type II ([place]   . ETD (eustachian tube dysfunction)   . Gastritis   . GERD (gastroesophageal reflux disease)   . 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 History of thyroid cancer   . Hypercholesteremia   . Hypertension, benign   . Hypothyroid   . Myocardial infarction   . Postmenopausal     Past Surgical History:  Procedure Laterality Date  . ANGIOPLASTY     PDA 90-40 %  . BARTHOLIN GLAND CYST EXCISION  09-13-2004   Keratosis- no CA  . BREAST BIOPSY     LEFT SPOT REMOVED  . CARDIAC CATHETERIZATION  03/15/2009   stenosis of diagonal branch of LAD- PTCA performed  . CHOLECYSTECTOMY  08-23-2002  . CORONARY ANGIOPLASTY    . ENDOMETRIAL BIOPSY  02-20-1998  . LEFT HEART CATHETERIZATION WITH CORONARY ANGIOGRAM N/A 11/26/2011   Procedure: LEFT HEART CATHETERIZATION WITH CORONARY ANGIOGRAM;  Surgeon: Peter M JMartinique MD;  [facility];  Service: Cardiovascular;  Laterality: N/A;  . PTCA  10/23/2000 and 03/15/09  . 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 TONSILLECTOMY    . TUBAL LIGATION      Family History  Problem Relation Age of Onset  . Diabetes Mother     Deceased- hypoglycemic coma   . Hypertension Mother   . Other Father     unaware of his health history - died when pt was 76yr old.  .Marland KitchenHeart attack Sister   . Diabetes Mellitus II Brother   . Heart attack Brother   . Heart attack Brother   . Diabetes Mellitus II Brother   . Epilepsy Brother   . Breast cancer Daughter     Social History:  reports that she quit smoking about 36 years ago. Her smoking use included Cigarettes. She  quit smokeless tobacco use about 22 years ago. Her smokeless tobacco use included Chew. She reports that she does not drink alcohol or use drugs.   Review of Systems:  There is a  history of high blood pressure.              Has Diabetes, she is on insulin and last  A1c was 7.9 with her PCP.  Now it is  7  today  Examination:   BP (!) 118/50   Pulse 60   Temp 98.7 F (37.1 C) (Oral)   Resp 16   Ht _0  (1.575 m)   Wt 159 lb 4 oz (72.2 kg)   LMP 11/24/1992   SpO2 99%   BMI 29.13 kg/m         Thyroid exam: Left lobe is enlarged about 1-1/2 times normal, diffusely palpable, slightly firm and smooth without nodules Right lobe is enlarged about 1.5-2 times normal Right lobe feels irregular without any discrete nodule No lymph nodes felt in the neck  Biceps reflexes are normal   Assessment/Plan:  History of Hashimoto thyroiditis as shown on previous surgery of the right lobe  She has a small goiter Her thyroid enlargement is quite stable and the right lobe which was enlarging appears to be slightly smaller this year Left lobe was about the same  She continues to be euthyroid with TSH below 1.0  Will check her TSH again in  12 months and follow her thyroid enlargement clinically  DIABETES: She appears to have better control of her diabetes now  [facility] 01/28/2017

## 2017-02-14 ENCOUNTER — Encounter: Payer: Self-pay | Admitting: Family Medicine

## 2017-02-14 ENCOUNTER — Ambulatory Visit (INDEPENDENT_AMBULATORY_CARE_PROVIDER_SITE_OTHER): Payer: Medicare Other | Admitting: Family Medicine

## 2017-02-14 DIAGNOSIS — J069 Acute upper respiratory infection, unspecified: Secondary | ICD-10-CM

## 2017-02-14 DIAGNOSIS — E1169 Type 2 diabetes mellitus with other specified complication: Secondary | ICD-10-CM | POA: Diagnosis not present

## 2017-02-14 DIAGNOSIS — E118 Type 2 diabetes mellitus with unspecified complications: Secondary | ICD-10-CM

## 2017-02-14 DIAGNOSIS — I10 Essential (primary) hypertension: Secondary | ICD-10-CM | POA: Diagnosis not present

## 2017-02-14 DIAGNOSIS — IMO0002 Reserved for concepts with insufficient information to code with codable children: Secondary | ICD-10-CM

## 2017-02-14 DIAGNOSIS — E1165 Type 2 diabetes mellitus with hyperglycemia: Secondary | ICD-10-CM

## 2017-02-14 MED ORDER — GLUCOSE BLOOD VI STRP: ORAL_STRIP | 4 refills | Status: DC

## 2017-02-14 MED ORDER — ONETOUCH DELICA LANCETS 33G MISC: 4 refills | Status: DC

## 2017-02-14 MED ORDER — ONETOUCH ULTRA MINI W/DEVICE KIT: PACK | 0 refills | Status: DC

## 2017-02-14 MED ORDER — TETANUS-DIPHTH-ACELL PERTUSSIS 5-2-15.5 LF-MCG/0.5 IM SUSP: 0.5000 mL | Freq: Once | INTRAMUSCULAR | 0 refills | Status: AC

## 2017-02-14 NOTE — Patient Instructions (Addendum)
It was nice seeing you today. Your A1C improved a lot. Let us continue current dose of your Insulin. I will like for you to get tetanus shot. Tdap prescription  Was [facility] to the [facility]).

## 2017-02-14 NOTE — Assessment & Plan Note (Signed)
Normal respiratory exam. URI likely viral and it is gradually resolving. Continue Robitussin as needed. F/U as needed.

## 2017-02-14 NOTE — Assessment & Plan Note (Signed)
BP looks good. No medication adjustment is warranted at this time.

## 2017-02-14 NOTE — Assessment & Plan Note (Addendum)
A1C reduced from 7.8 to 7 which is goal for her. Patient commended on improving her diet and compliance with her medications. F/U in 3 months for A1C check. She mentioned she had recent eye exam with Dr. Gershon Crane, we will attempt to obtain her record. Glucometer supplies refilled today.

## 2017-02-14 NOTE — Progress Notes (Signed)
Subjective:     Patient ID: [name], female   DOB: [birth year], 76 y.o.   MRN: [id number]  HPI DM2/HTN: She is compliant with her meds, here for follow up. Denies hypoglycemic concern. She is compliant with Metformin XR 500 mg BID  And Lantus 25 units qd.She need glucometer supplies. Cough:C/O cough for few days, started initially with a sore throat which has since resolved. She uses Robitussin as needed for cough and she is now feeling much better. No SOB, no chest pain,no fever.  Current Outpatient Prescriptions on File Prior to Visit  Medication Sig Dispense Refill  . amLODipine (NORVASC) 10 MG tablet TAKE ONE TABLET BY MOUTH DAILY 90 tablet 1  . aspirin 81 MG chewable tablet Chew 81 mg by mouth daily.      [name] atorvastatin (LIPITOR) 20 MG tablet Take 1 tablet (20 mg total) by mouth daily at 6 PM. 90 tablet 2  . carvedilol (COREG) 25 MG tablet TAKE ONE TABLET BY MOUTH TWICE DAILY WITH MEALS 180 tablet 1  . ferrous sulfate 325 (65 FE) MG tablet Take 325 mg by mouth daily with breakfast.    . isosorbide mononitrate (IMDUR) 60 MG 24 hr tablet TAKE ONE TABLET BY MOUTH DAILY 90 tablet 1  . LANTUS 100 UNIT/ML injection INJECT 25 UNITS SUBCUTANEOUSLY ONCE DAILY IN THE MORNING.  HOLD IF GLUCOSE IS LESS THAN 70 10 vial 4  . lisinopril-hydrochlorothiazide (ZESTORETIC) 20-12.5 MG tablet Take 2 tablets by mouth daily. 180 tablet 2  . metFORMIN (GLUCOPHAGE-XR) 500 MG 24 hr tablet TAKE TWO TABLETS BY MOUTH TWICE DAILY 180 tablet 1  . Multiple Vitamin (MULTIVITAMIN WITH MINERALS) TABS tablet Take 1 tablet by mouth daily. Reported on 04/02/2016    . B-D INS SYR ULTRAFINE 1CC/31G 31G X 5/16" 1 ML MISC USE TO INJECT INSULIN SUBCUTANEOUSLY DAILY AS INSTRUCTED 100 each 11  . Blood Glucose Monitoring Suppl (ONE TOUCH ULTRA MINI) W/DEVICE KIT Check blood sugar twice a day before meals. Diabetes Mellitus Type 2. E11.65 1 each 0  . fluticasone (FLONASE) 50 MCG/ACT nasal spray Place 2 sprays into the nose daily  as needed. For allergies (Patient not taking: Reported on 02/14/2017) 16 g 6  . Garlic 1601 MG CAPS Take 1,000 mg by mouth daily.    [name] glucose blood (ONE TOUCH ULTRA TEST) test strip USE TO CHECK BLOOD SUGARS IN THE MORNING AND THE EVENING (Patient not taking: Reported on 02/14/2017) 100 each 4  . ibuprofen (ADVIL,MOTRIN) 200 MG tablet Take 200 mg by mouth every 6 (six) hours as needed for moderate pain.    [name] loratadine (CLARITIN) 10 MG tablet Take 10 mg by mouth daily.    . nitroGLYCERIN (NITROSTAT) 0.4 MG SL tablet Place 1 tablet (0.4 mg total) under the tongue every 5 (five) minutes as needed for chest pain. (Patient not taking: Reported on 02/14/2017) 25 tablet 3  . ONETOUCH DELICA LANCETS 09N MISC Use to check blood sugar in the morning and the evening. 100 each 5   No current facility-administered medications on file prior to visit.    Past Medical History:  Diagnosis Date  . Anemia   . Cor pulmonale ([place])   . Coronary artery disease    a. Remote PTCA of the PDA;  b. 02/2009 PCI/CBA to the D1;  c. 11/2011 Cath: LM nl, LAD 20p, 50m D1 patent, RI 20p, LCX nl, RCA 30d, EF 55-65%.  . CTS (carpal tunnel syndrome)   . Diabetes mellitus, type II ([place]   .  ETD (eustachian tube dysfunction)   . Gastritis   . GERD (gastroesophageal reflux disease)   . 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 History of thyroid cancer   . Hypercholesteremia   . Hypertension, benign   . Hypothyroid   . Myocardial infarction   . Postmenopausal      Review of Systems  Constitutional: Negative for fever.  Respiratory: Positive for cough. Negative for shortness of breath.   Cardiovascular: Negative.   Gastrointestinal: Negative.   Musculoskeletal: Negative.   Neurological: Negative.   All other systems reviewed and are negative.      Objective:   Physical Exam  Constitutional: She is oriented to person, place, and time. She appears well-developed and  well-nourished. No distress.  HENT:  Head: Normocephalic.  Right Ear: External ear normal.  Left Ear: External ear normal.  Nose: Nose normal.  Mouth/Throat: No oropharyngeal exudate.  Eyes: Conjunctivae and EOM are normal. Pupils are equal, round, and reactive to light. Right eye exhibits no discharge. Left eye exhibits no discharge.  Neck: Neck supple.  Cardiovascular: Normal rate, regular rhythm and normal heart sounds.   No murmur heard. Pulmonary/Chest: Effort normal and breath sounds normal. No respiratory distress. She has no wheezes.  Abdominal: Soft. Bowel sounds are normal. She exhibits no distension and no mass. There is no tenderness. There is no rebound.  Musculoskeletal: Normal range of motion. She exhibits no edema.  Lymphadenopathy:    She has no cervical adenopathy.  Neurological: She is alert and oriented to person, place, and time. No cranial nerve deficit.  Nursing note and vitals reviewed.      Assessment:     DM2 HTN URI    Plan:     Check problem list.  Tdap recommended. [facility] at her request. She will go to the pharmacy for Tdap. She requested that her glucometer supplies be e-scribed to [facility] instead.

## 2017-02-18 ENCOUNTER — Telehealth: Payer: Self-pay | Admitting: *Deleted

## 2017-02-18 NOTE — Telephone Encounter (Signed)
LM for patient to call back.  I did not have her sign a release while she was in clinic last.  If patient calls back, please have her come in to sign a release of information for Dr. Gershon Crane for her diabetic eye exam.  Thanks Johnney Ou

## 2017-02-18 NOTE — Telephone Encounter (Signed)
-----   Message from [name], MD sent at 02/17/2017  4:32 PM EST ----- Irving Copas,  I believe the email below was from [place]. Do you by any chance have the signed release of information for this patient? Please forward it to [place] so she can help obtain report. Thanks.  Eniola   ----- Message ----- From: Carolan Clines Sent: 02/17/2017   4:24 PM To: [name], MD  I was unable to get records from Dr. Gershon Crane due to patient does not have [place] listed as the PCP (self-referral). Will need a ROI. Sorry.  ----- Message ----- From: [name], MD Sent: 02/17/2017   7:41 AM To: [place],   Please help obtain DM eye exam from Dr. Kellie Moor office.   Eniola

## 2017-02-21 NOTE — Telephone Encounter (Signed)
Patient will come by the office next week to sign a release of information for Dr. Gershon Crane. Jazmin Hartsell,CMA

## 2017-02-27 ENCOUNTER — Other Ambulatory Visit: Payer: Self-pay | Admitting: Family Medicine

## 2017-02-27 MED ORDER — "INSULIN SYRINGE-NEEDLE U-100 31G X 5/16"" 1 ML MISC": 11 refills | Status: DC

## 2017-02-27 NOTE — Telephone Encounter (Signed)
Pt calling to request refill of:  Name of Medication(s):  syringes  Last date of OV:  02-14-17 [facility]   Will route refill request to Clinic RN.  Discussed with patient policy to call pharmacy for future refills.  Also, discussed refills may take up to 48 hours to approve or deny.  Renella Cunas

## 2017-04-02 ENCOUNTER — Other Ambulatory Visit: Payer: Self-pay | Admitting: Family Medicine

## 2017-05-13 ENCOUNTER — Encounter: Payer: Self-pay | Admitting: Family Medicine

## 2017-05-13 ENCOUNTER — Ambulatory Visit (INDEPENDENT_AMBULATORY_CARE_PROVIDER_SITE_OTHER): Payer: Medicare Other | Admitting: Family Medicine

## 2017-05-13 ENCOUNTER — Telehealth: Payer: Self-pay | Admitting: Family Medicine

## 2017-05-13 VITALS — BP 136/58 | HR 71 | Temp 98.4°F | Ht 62.0 in | Wt 156.6 lb

## 2017-05-13 DIAGNOSIS — E1169 Type 2 diabetes mellitus with other specified complication: Secondary | ICD-10-CM

## 2017-05-13 DIAGNOSIS — M545 Low back pain, unspecified: Secondary | ICD-10-CM

## 2017-05-13 LAB — POCT GLYCOSYLATED HEMOGLOBIN (HGB A1C): HEMOGLOBIN A1C: 7.7

## 2017-05-13 NOTE — Telephone Encounter (Signed)
I called and discussed A1C result with her which went up from 7.0 to 7.7  For her age goal should be <8.0 Her home glucose ranges from 98 lowest to 121 highest. Few days ago she had a value of 161 because she had not taken her meds. She stated she misses her Metformin dose here and there.  For now no adjustment to her regimen is warranted. F/U in 3 months. She verbalized understanding and agreed with plan.

## 2017-05-13 NOTE — Progress Notes (Signed)
   Subjective:   Michele CHOJNOWSKI is a 76 y.o. female with a history of IBS, T2 DM, hypothyroidism, HTN, CAD, HLD, thyroid cancer here for same day appointment for  Chief Complaint  Patient presents with  . Back Pain     Patient reports left-sided back pain after digging in her garden 6 days ago. The pain seemed to get better with ibuprofen and heating pad but remains sore. 3 days ago, she was taking in her garden more and the pain worsened again. It seems to be getting better from that point again. The pain was worse with getting up from sitting but is very mild today. She denies fever, weakness, numbness, incontinence, saddle anesthesia, previous episodes of back pain like this.  Review of Systems:  Per HPI.   Social History: Former smoker  Objective:  BP (!) 136/58   Pulse 71   Temp 98.4 F (36.9 C) (Oral)   Ht 5\' 2"  (1.575 m)   Wt 156 lb 9.6 oz (71 kg)   LMP 11/24/1992   SpO2 96%   BMI 28.64 kg/m   Gen:  76 y.o. female in NAD HEENT: NCAT, MMM, EOMI, PERRL, anicteric sclerae CV: RRR, no MRG Resp: Non-labored, CTAB, no wheezes noted Ext: WWP, no edema MSK: No tenderness to palpation over midline or paraspinal cervical, thoracic, lumbar, sacral spine. Gait is intact, negative straight leg raise bilaterally, strength is intact in bilateral lower extremities Neuro: Alert and oriented, speech normal     Assessment & Plan:     JOSSLYN CIOLEK is a 76 y.o. female here for   Acute left-sided low back pain without sciatica Back pain after more intensive exercise than typical Is resolving and has no abnormalities on exam today No red flag symptoms No indication for imaging at this time Can use Tylenol or heating pad as it helps Advised easy gentle stretching and staying out of the garden for a little while Return precautions discussed   Shahzad Thomann, Dionne Bucy, MD MPH PGY-3,  [facility] 05/13/2017  12:23 PM

## 2017-05-13 NOTE — Patient Instructions (Signed)

## 2017-05-13 NOTE — Assessment & Plan Note (Signed)
Back pain after more intensive exercise than typical Is resolving and has no abnormalities on exam today No red flag symptoms No indication for imaging at this time Can use Tylenol or heating pad as it helps Advised easy gentle stretching and staying out of the garden for a little while Return precautions discussed

## 2017-05-21 ENCOUNTER — Other Ambulatory Visit: Payer: Self-pay | Admitting: Family Medicine

## 2017-07-23 ENCOUNTER — Telehealth: Payer: Self-pay | Admitting: Student in an Organized Health Care Education/Training Program

## 2017-07-23 NOTE — Telephone Encounter (Signed)
**  After Hours/ Emergency Line Call*  Received a call to report that [name] was pulling on her washing machine.  Endorsing improvement with ibuprofen, but having some trouble walking.  Denying back pain, numbness.  Recommended that patient use ibuprofen, tylenol, heating pads.  Patient will call clinic for an appointment tomorrow if symptoms worsen or fail to improve. Asked that she call back if anything gets worse or changes.  Red flags discussed.  Will forward to PCP.  Everrett Coombe, MD PGY-2, [facility]

## 2017-07-24 ENCOUNTER — Other Ambulatory Visit: Payer: Self-pay | Admitting: Family Medicine

## 2017-07-24 ENCOUNTER — Telehealth: Payer: Self-pay | Admitting: Family Medicine

## 2017-07-24 MED ORDER — BACLOFEN 5 MG PO TABS: 5.0000 mg | ORAL_TABLET | Freq: Two times a day (BID) | ORAL | 0 refills | Status: DC | PRN

## 2017-07-24 NOTE — Telephone Encounter (Signed)
I escribed baclofen. She will have to come in soon for any other pain related prescription.

## 2017-07-24 NOTE — Telephone Encounter (Signed)
Pt pulled a muscle in her buttocks moving a washing machine over the weekend. Pt has tried everything home, but it hasn't helped. Pt would like a muscle relaxer called into [facility] on [address]. Pt declined appt at this time. ep

## 2017-07-25 ENCOUNTER — Other Ambulatory Visit: Payer: Self-pay | Admitting: Family Medicine

## 2017-07-25 MED ORDER — BACLOFEN 10 MG PO TABS: 5.0000 mg | ORAL_TABLET | Freq: Two times a day (BID) | ORAL | 0 refills | Status: DC | PRN

## 2017-07-25 NOTE — Telephone Encounter (Signed)
Received fax from [facility] stating they can not get Baclofen 5 mg tab from wholesaler. Please change to different dosage.  Derl Barrow, RN

## 2017-07-25 NOTE — Telephone Encounter (Signed)
I escribed 10 mg dose.

## 2017-07-25 NOTE — Telephone Encounter (Signed)
Pt states pharmacy did not have the medication that was called in and would like [place] to call something else in asap. ep

## 2017-07-31 ENCOUNTER — Other Ambulatory Visit: Payer: Self-pay | Admitting: Family Medicine

## 2017-08-20 ENCOUNTER — Other Ambulatory Visit: Payer: Self-pay | Admitting: Family Medicine

## 2017-08-28 ENCOUNTER — Emergency Department (HOSPITAL_COMMUNITY): Payer: Medicare Other

## 2017-08-28 ENCOUNTER — Encounter (HOSPITAL_COMMUNITY): Payer: Self-pay | Admitting: *Deleted

## 2017-08-28 ENCOUNTER — Observation Stay (HOSPITAL_COMMUNITY)
Admission: EM | Admit: 2017-08-28 | Discharge: 2017-08-29 | Disposition: A | Discharge status: Home / Self Care | Payer: Medicare Other | Attending: Family Medicine | Admitting: Family Medicine

## 2017-08-28 DIAGNOSIS — E785 Hyperlipidemia, unspecified: Secondary | ICD-10-CM | POA: Insufficient documentation

## 2017-08-28 DIAGNOSIS — I2510 Atherosclerotic heart disease of native coronary artery without angina pectoris: Secondary | ICD-10-CM | POA: Diagnosis not present

## 2017-08-28 DIAGNOSIS — Z85850 Personal history of malignant neoplasm of thyroid: Secondary | ICD-10-CM | POA: Diagnosis not present

## 2017-08-28 DIAGNOSIS — I10 Essential (primary) hypertension: Secondary | ICD-10-CM | POA: Insufficient documentation

## 2017-08-28 DIAGNOSIS — M5489 Other dorsalgia: Principal | ICD-10-CM | POA: Insufficient documentation

## 2017-08-28 DIAGNOSIS — E1129 Type 2 diabetes mellitus with other diabetic kidney complication: Secondary | ICD-10-CM

## 2017-08-28 DIAGNOSIS — E063 Autoimmune thyroiditis: Secondary | ICD-10-CM | POA: Insufficient documentation

## 2017-08-28 DIAGNOSIS — M25512 Pain in left shoulder: Secondary | ICD-10-CM

## 2017-08-28 DIAGNOSIS — Z885 Allergy status to narcotic agent status: Secondary | ICD-10-CM | POA: Insufficient documentation

## 2017-08-28 DIAGNOSIS — Z794 Long term (current) use of insulin: Secondary | ICD-10-CM | POA: Insufficient documentation

## 2017-08-28 DIAGNOSIS — E7800 Pure hypercholesterolemia, unspecified: Secondary | ICD-10-CM | POA: Diagnosis not present

## 2017-08-28 DIAGNOSIS — Z955 Presence of coronary angioplasty implant and graft: Secondary | ICD-10-CM | POA: Diagnosis not present

## 2017-08-28 DIAGNOSIS — I700 Atherosclerosis of aorta: Secondary | ICD-10-CM

## 2017-08-28 DIAGNOSIS — R0789 Other chest pain: Secondary | ICD-10-CM | POA: Diagnosis present

## 2017-08-28 DIAGNOSIS — I252 Old myocardial infarction: Secondary | ICD-10-CM | POA: Insufficient documentation

## 2017-08-28 DIAGNOSIS — E049 Nontoxic goiter, unspecified: Secondary | ICD-10-CM | POA: Diagnosis present

## 2017-08-28 DIAGNOSIS — R911 Solitary pulmonary nodule: Secondary | ICD-10-CM

## 2017-08-28 DIAGNOSIS — Z87891 Personal history of nicotine dependence: Secondary | ICD-10-CM | POA: Diagnosis not present

## 2017-08-28 DIAGNOSIS — K7689 Other specified diseases of liver: Secondary | ICD-10-CM

## 2017-08-28 DIAGNOSIS — D649 Anemia, unspecified: Secondary | ICD-10-CM | POA: Diagnosis present

## 2017-08-28 DIAGNOSIS — R079 Chest pain, unspecified: Secondary | ICD-10-CM | POA: Insufficient documentation

## 2017-08-28 DIAGNOSIS — E119 Type 2 diabetes mellitus without complications: Secondary | ICD-10-CM

## 2017-08-28 DIAGNOSIS — D638 Anemia in other chronic diseases classified elsewhere: Secondary | ICD-10-CM | POA: Diagnosis present

## 2017-08-28 DIAGNOSIS — Z882 Allergy status to sulfonamides status: Secondary | ICD-10-CM | POA: Insufficient documentation

## 2017-08-28 DIAGNOSIS — Z7984 Long term (current) use of oral hypoglycemic drugs: Secondary | ICD-10-CM | POA: Insufficient documentation

## 2017-08-28 HISTORY — DX: Presence of coronary angioplasty implant and graft: Z95.5

## 2017-08-28 HISTORY — DX: Solitary pulmonary nodule: R91.1

## 2017-08-28 HISTORY — DX: Atherosclerosis of aorta: I70.0

## 2017-08-28 HISTORY — DX: Pain in left shoulder: M25.512

## 2017-08-28 LAB — GLUCOSE, CAPILLARY: Glucose-Capillary: 90 mg/dL (ref 65–99)

## 2017-08-28 LAB — BASIC METABOLIC PANEL
Anion gap: 9 (ref 5–15)
BUN: 14 mg/dL (ref 6–20)
CALCIUM: 9.6 mg/dL (ref 8.9–10.3)
CHLORIDE: 105 mmol/L (ref 101–111)
CO2: 28 mmol/L (ref 22–32)
CREATININE: 1 mg/dL (ref 0.44–1.00)
GFR calc non Af Amer: 54 mL/min — ABNORMAL LOW (ref >60)
Glucose, Bld: 222 mg/dL — ABNORMAL HIGH (ref 65–99)
Potassium: 4.3 mmol/L (ref 3.5–5.1)
Sodium: 142 mmol/L (ref 135–145)

## 2017-08-28 LAB — CBC
HCT: 32.7 % — ABNORMAL LOW (ref 36.0–46.0)
Hemoglobin: 10 g/dL — ABNORMAL LOW (ref 12.0–15.0)
MCH: 26.6 pg (ref 26.0–34.0)
MCHC: 30.6 g/dL (ref 30.0–36.0)
MCV: 87 fL (ref 78.0–100.0)
PLATELETS: 123 10*3/uL — AB (ref 150–400)
RBC: 3.76 MIL/uL — AB (ref 3.87–5.11)
RDW: 16.2 % — AB (ref 11.5–15.5)
WBC: 4.7 10*3/uL (ref 4.0–10.5)

## 2017-08-28 LAB — I-STAT TROPONIN, ED
TROPONIN I, POC: 0 ng/mL (ref 0.00–0.08)
TROPONIN I, POC: 0 ng/mL (ref 0.00–0.08)

## 2017-08-28 LAB — D-DIMER, QUANTITATIVE: D-Dimer, Quant: 1.98 ug/mL-FEU — ABNORMAL HIGH (ref 0.00–0.50)

## 2017-08-28 MED ORDER — FERROUS SULFATE 325 (65 FE) MG PO TABS
325.0000 mg | ORAL_TABLET | Freq: Every day | ORAL | Status: DC
  Administered 2017-08-29: 325 mg via ORAL
  Filled 2017-08-28: qty 1

## 2017-08-28 MED ORDER — IBUPROFEN 400 MG PO TABS
600.0000 mg | ORAL_TABLET | Freq: Once | ORAL | Status: AC
  Administered 2017-08-28: 14:00:00 600 mg via ORAL
  Filled 2017-08-28: qty 1

## 2017-08-28 MED ORDER — ASPIRIN 81 MG PO CHEW
81.0000 mg | CHEWABLE_TABLET | Freq: Every day | ORAL | Status: DC
  Administered 2017-08-29: 81 mg via ORAL
  Filled 2017-08-28: qty 1

## 2017-08-28 MED ORDER — LISINOPRIL-HYDROCHLOROTHIAZIDE 20-12.5 MG PO TABS: 1.0000 | ORAL_TABLET | Freq: Every day | ORAL | Status: DC

## 2017-08-28 MED ORDER — ATORVASTATIN CALCIUM 20 MG PO TABS
20.0000 mg | ORAL_TABLET | Freq: Every day | ORAL | Status: DC
  Administered 2017-08-28: 20 mg via ORAL
  Filled 2017-08-28: qty 1

## 2017-08-28 MED ORDER — AMLODIPINE BESYLATE 10 MG PO TABS
10.0000 mg | ORAL_TABLET | Freq: Every day | ORAL | Status: DC
  Administered 2017-08-29: 10 mg via ORAL
  Filled 2017-08-28: qty 1

## 2017-08-28 MED ORDER — INSULIN ASPART 100 UNIT/ML ~~LOC~~ SOLN: 0.0000 [IU] | Freq: Three times a day (TID) | SUBCUTANEOUS | Status: DC

## 2017-08-28 MED ORDER — INSULIN GLARGINE 100 UNIT/ML ~~LOC~~ SOLN
12.0000 [IU] | Freq: Every day | SUBCUTANEOUS | Status: DC
  Administered 2017-08-29: 12 [IU] via SUBCUTANEOUS
  Filled 2017-08-28: qty 0.12

## 2017-08-28 MED ORDER — ADULT MULTIVITAMIN W/MINERALS CH
1.0000 | ORAL_TABLET | Freq: Every day | ORAL | Status: DC
  Administered 2017-08-29: 1 via ORAL
  Filled 2017-08-28: qty 1

## 2017-08-28 MED ORDER — LISINOPRIL 20 MG PO TABS
20.0000 mg | ORAL_TABLET | Freq: Every day | ORAL | Status: DC
  Administered 2017-08-29: 20 mg via ORAL
  Filled 2017-08-28 (×2): qty 1

## 2017-08-28 MED ORDER — ACETAMINOPHEN 325 MG PO TABS: 650.0000 mg | ORAL_TABLET | ORAL | Status: DC | PRN

## 2017-08-28 MED ORDER — CARVEDILOL 25 MG PO TABS
25.0000 mg | ORAL_TABLET | Freq: Two times a day (BID) | ORAL | Status: DC
  Administered 2017-08-29 (×2): 25 mg via ORAL
  Filled 2017-08-28 (×2): qty 1

## 2017-08-28 MED ORDER — HYDROCHLOROTHIAZIDE 12.5 MG PO CAPS
12.5000 mg | ORAL_CAPSULE | Freq: Every day | ORAL | Status: DC
  Administered 2017-08-29: 12.5 mg via ORAL
  Filled 2017-08-28: qty 1

## 2017-08-28 MED ORDER — HEPARIN SODIUM (PORCINE) 5000 UNIT/ML IJ SOLN
5000.0000 [IU] | Freq: Three times a day (TID) | INTRAMUSCULAR | Status: DC
  Administered 2017-08-28 – 2017-08-29 (×2): 5000 [IU] via SUBCUTANEOUS
  Filled 2017-08-28 (×2): qty 1

## 2017-08-28 MED ORDER — IOPAMIDOL (ISOVUE-370) INJECTION 76%
INTRAVENOUS | Status: AC
  Administered 2017-08-28: 100 mL
  Filled 2017-08-28: qty 100

## 2017-08-28 MED ORDER — NITROGLYCERIN 0.4 MG SL SUBL: 0.4000 mg | SUBLINGUAL_TABLET | SUBLINGUAL | Status: DC | PRN

## 2017-08-28 NOTE — ED Provider Notes (Signed)
[facility] DEPT Provider Note   CSN: [id number] Arrival date & time: 08/28/17  1052     History   Chief Complaint Chief Complaint  Patient presents with  . Back Pain    Upper  . Chest Pain    HPI [name] is a 76 y.o. female.  HPI 76 year old African-American female past medical history significant for CAD with PCI and 2010, 2012, diabetes, hypertension, hyperlipidemia, hypothyroidism that presents to the emergency department today for evaluation of upper back pain that radiates to the anterior chest. Patient states the pain is worse with movement. States that it has since decreased from initial onset. States that she developed this pain that awoke her from sleep this morning. States that the last time that she had a heart attack she felt the same way. Patient denies any associated shortness of breath, nausea, emesis, diaphoresis. Denies any recent trauma. Patient states that the pain in her back is a 10/10. She has not tried for her pain prior to arrival. Moving makes the pain worse. Nothing makes the pain better.  Patient does note that she had recent travel to [place] this past weekend. She denies any lower extremity edema or calf tenderness, history of PE/DVT, recent hospitalizations/surgeries, tobacco use, hormone use.  Pt denies any fever, chill, ha, vision changes, lightheadedness, dizziness, congestion, neck pain, sob cough, abd pain, n/v/d, urinary symptoms, change in bowel habits, melena, hematochezia, lower extremity paresthesias.  Past Medical History:  Diagnosis Date  . Anemia   . Cor pulmonale ([place])   . Coronary artery disease    a. Remote PTCA of the PDA;  b. 02/2009 PCI/CBA to the D1;  c. 11/2011 Cath: LM nl, LAD 20p, 44m D1 patent, RI 20p, LCX nl, RCA 30d, EF 55-65%.  . CTS (carpal tunnel syndrome)   . Diabetes mellitus, type II ([place]   . ETD (eustachian tube dysfunction)   . Gastritis   . GERD (gastroesophageal reflux disease)   . 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 History of thyroid cancer   . Hypercholesteremia   . Hypertension, benign   . Hypothyroid   . [place]ocardial infarction ([place]   . Postmenopausal     Patient Active Problem List   Diagnosis Date Noted  . Acute left-sided low back pain without sciatica 05/13/2017  . Acute upper respiratory infection 02/14/2017  . Thickened nail 03/01/2016  . IBS (irritable bowel syndrome) 12/27/2014  . Left thyroid nodule 07/27/2014  . Thyroid enlargement 04/12/2013  . History of thyroid cancer 04/12/2013  . HYPOTHYROIDISM, UNSPECIFIED 02/19/2007  . Diabetes mellitus type 2, controlled ([place] 02/19/2007  . HYPERCHOLESTEROLEMIA 02/19/2007  . ANEMIA, OTHER, UNSPECIFIED 02/19/2007  . CARPAL TUNNEL SYNDROME 02/19/2007  . HYPERTENSION, BENIGN SYSTEMIC 02/19/2007  . Coronary atherosclerosis 02/19/2007  . COR PULMONALE 02/19/2007    Past Surgical History:  Procedure Laterality Date  . ANGIOPLASTY     PDA 90-40 %  . BARTHOLIN GLAND CYST EXCISION  09-13-2004   Keratosis- no CA  . BREAST BIOPSY     LEFT SPOT REMOVED  . CARDIAC CATHETERIZATION  03/15/2009   stenosis of diagonal branch of LAD- PTCA performed  . CHOLECYSTECTOMY  08-23-2002  . CORONARY ANGIOPLASTY    . ENDOMETRIAL BIOPSY  02-20-1998  . LEFT HEART CATHETERIZATION WITH CORONARY ANGIOGRAM N/A 11/26/2011   Procedure: LEFT HEART CATHETERIZATION WITH CORONARY ANGIOGRAM;  Surgeon: Peter M JMartinique MD;  [facility];  Service: Cardiovascular;  Laterality: N/A;  .  PTCA  10/23/2000 and 03/15/09  . 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 TONSILLECTOMY    . TUBAL LIGATION      OB History    No data available       Home Medications    Prior to Admission medications   Medication Sig Start Date End Date Taking? Authorizing Provider  amLODipine (NORVASC) 10 MG tablet TAKE ONE TABLET BY MOUTH DAILY Patient taking differently: Take 10 mg by  mouth once a day 08/16/16  Yes [name], MD  aspirin 81 MG chewable tablet Chew 81 mg by mouth daily.     Yes [provider]  atorvastatin (LIPITOR) 20 MG tablet TAKE ONE TABLET BY MOUTH DAILY AT 6PM Patient taking differently: Take 20 mg by mouth in the evening at 6 PM 07/31/17  Yes Eniola, Kehinde T, MD  carvedilol (COREG) 25 MG tablet TAKE ONE TABLET BY MOUTH TWICE DAILY WITH MEALS Patient taking differently: Take 25 mg by mouth two times a day with meals 08/16/16  Yes Eniola, Kehinde T, MD  ferrous sulfate 325 (65 FE) MG tablet Take 325 mg by mouth daily with breakfast.   Yes [provider]  Garlic 1610 MG CAPS Take 1,000 mg by mouth daily.   Yes [provider]  ibuprofen (ADVIL,MOTRIN) 200 MG tablet Take 200 mg by mouth every 6 (six) hours as needed (for pain).    Yes [provider]  isosorbide mononitrate (IMDUR) 60 MG 24 hr tablet TAKE ONE TABLET BY MOUTH DAILY Patient taking differently: Take 60 mg by mouth once a day 04/03/17  Yes Eniola, Kehinde T, MD  LANTUS 100 UNIT/ML injection INJECT 25 UNITS SUBCUTANEOUSLY ONCE DAILY IN THE MORNING. HOLD IF GLUCOSE LEVEL IS LESS THAN 70 08/20/17  Yes Eniola, Kehinde T, MD  lisinopril-hydrochlorothiazide (ZESTORETIC) 20-12.5 MG tablet Take 2 tablets by mouth daily. Patient taking differently: Take 1 tablet by mouth 2 (two) times daily.  11/05/16  Yes [name], MD  loratadine (CLARITIN) 10 MG tablet Take 10 mg by mouth daily as needed for allergies.    Yes [provider]  metFORMIN (GLUCOPHAGE-XR) 500 MG 24 hr tablet TAKE TWO TABLETS BY MOUTH TWICE DAILY Patient taking differently: Take 1,000 mg by mouth two times a day 05/21/17  Yes [name], MD  Multiple Vitamin (MULTIVITAMIN WITH MINERALS) TABS tablet Take 1 tablet by mouth daily. Reported on 04/02/2016   Yes [provider]  baclofen (LIORESAL) 10 MG tablet Take 0.5 tablets (5 mg total) by mouth 2 (two) times daily as  needed for muscle spasms. Patient not taking: Reported on 08/28/2017 07/25/17   [name], MD  Blood Glucose Monitoring Suppl (ONE TOUCH ULTRA MINI) w/Device KIT Check blood sugar TID  before meals. Diabetes Mellitus Type 2. E11.9 02/14/17   [name], MD  fluticasone (FLONASE) 50 MCG/ACT nasal spray Place 2 sprays into the nose daily as needed. For allergies Patient not taking: Reported on 08/28/2017 03/12/13   Katherina Mires, MD  glucose blood (ONE TOUCH ULTRA TEST) test strip Check blood sugar TID  before meals. Diabetes Mellitus Type 2. E11.9 02/14/17   [name], MD  Insulin Syringe-Needle U-100 (B-D INS SYR ULTRAFINE 1CC/31G) 31G X 5/16" 1 ML MISC USE TO INJECT INSULIN SUBCUTANEOUSLY DAILY AS INSTRUCTED 02/27/17   [name], MD  nitroGLYCERIN (NITROSTAT) 0.4 MG SL tablet Place 1 tablet (0.4 mg total) under the tongue every 5 (five)  minutes as needed for chest pain. Patient not taking: Reported on 08/28/2017 04/20/14   Josue Hector, MD  [facility] DELICA LANCETS 78G MISC Check blood sugar TID  before meals. Diabetes Mellitus Type 2. E11.9 02/14/17   [name], MD    Family History Family History  Problem Relation Age of Onset  . Diabetes Mother        Deceased- hypoglycemic coma   . Hypertension Mother   . Other Father        unaware of his health history - died when pt was 76 yr old.  [name] Heart attack Sister   . Diabetes Mellitus II Brother   . Heart attack Brother   . Heart attack Brother   . Diabetes Mellitus II Brother   . Epilepsy Brother   . Breast cancer Daughter     Social History Social History  Substance Use Topics  . Smoking status: Former Smoker    Types: Cigarettes    Quit date: 12/23/1980  . Smokeless tobacco: Former Systems developer    Types: [place] date: 08/23/1994  . Alcohol use No     Allergies   Codeine; Sulfamethoxazole-trimethoprim; and Tramadol   Review of Systems Review of Systems  Constitutional: Negative for chills,  diaphoresis and fever.  HENT: Negative for congestion.   Eyes: Negative for visual disturbance.  Respiratory: Negative for cough and shortness of breath.   Cardiovascular: Positive for chest pain. Negative for palpitations and leg swelling.  Gastrointestinal: Negative for abdominal pain, diarrhea, nausea and vomiting.  Genitourinary: Negative for dysuria, flank pain, frequency, hematuria and urgency.  Musculoskeletal: Positive for back pain. Negative for arthralgias and myalgias.  Skin: Negative for rash.  Neurological: Negative for dizziness, syncope, weakness, light-headedness, numbness and headaches.  Psychiatric/Behavioral: Negative for sleep disturbance. The patient is not nervous/anxious.      Physical Exam Updated Vital Signs BP (!) 122/48   Pulse 62   Temp 98.3 F (36.8 C) (Oral)   Resp 19   LMP 11/24/1992   SpO2 100%   Physical Exam  Constitutional: She is oriented to person, place, and time. She appears well-developed and well-nourished.  Non-toxic appearance. No distress.  HENT:  Head: Normocephalic and atraumatic.  Nose: Nose normal.  Mouth/Throat: Oropharynx is clear and moist.  Eyes: Pupils are equal, round, and reactive to light. Conjunctivae are normal. Right eye exhibits no discharge. Left eye exhibits no discharge.  Neck: Normal range of motion. Neck supple. No JVD present. No tracheal deviation present.  No c spine midline tenderness. No paraspinal tenderness. No deformities or step offs noted. Full ROM. Supple. No nuchal rigidity.    Cardiovascular: Normal rate, regular rhythm, normal heart sounds and intact distal pulses.  Exam reveals no gallop and no friction rub.   No murmur heard. Pulmonary/Chest: Effort normal and breath sounds normal. No respiratory distress. She has no wheezes. She has no rales. She exhibits no tenderness.  No hypoxia or tachypnea.  Abdominal: Soft. Bowel sounds are normal. She exhibits no distension. There is no tenderness. There is  no rebound and no guarding.  Musculoskeletal: Normal range of motion.  No lower extremity edema or calf tenderness.  No midline T spine or L spine tenderness. No deformities or step offs noted. Full ROM. Pelvis is stable.   Lymphadenopathy:    She has no cervical adenopathy.  Neurological: She is alert and oriented to person, place, and time.  Skin: Skin is warm and dry. Capillary refill takes less  than 2 seconds. She is not diaphoretic.  Psychiatric: Her behavior is normal. Judgment and thought content normal.  Nursing note and vitals reviewed.    ED Treatments / Results  Labs (all labs ordered are listed, but only abnormal results are displayed) Labs Reviewed  BASIC METABOLIC PANEL - Abnormal; Notable for the following:       Result Value   Glucose, Bld 222 (*)    GFR calc non Af Amer 54 (*)    All other components within normal limits  CBC - Abnormal; Notable for the following:    RBC 3.76 (*)    Hemoglobin 10.0 (*)    HCT 32.7 (*)    RDW 16.2 (*)    Platelets 123 (*)    All other components within normal limits  D-DIMER, QUANTITATIVE (NOT AT [facility]) - Abnormal; Notable for the following:    D-Dimer, Quant 1.98 (*)    All other components within normal limits  I-STAT TROPONIN, ED  I-STAT TROPONIN, ED    EKG  EKG Interpretation  Date/Time:  Thursday August 28 2017 10:56:40 EDT Ventricular Rate:  77 PR Interval:  156 QRS Duration: 72 QT Interval:  322 QTC Calculation: 364 R Axis:   21 Text Interpretation:  Normal sinus rhythm Anterior infarct , age undetermined ST & T wave abnormality, consider inferolateral ischemia Abnormal ECG No significant change since last tracing Confirmed by Quintella Reichert [phone]) on 08/28/2017 12:59:10 PM       Radiology Dg Chest 2 View  Result Date: 08/28/2017 CLINICAL DATA:  Flank and back pain EXAM: CHEST  2 VIEW COMPARISON:  August 24, 2014 and prior studies FINDINGS: Lungs are clear. Heart size and pulmonary vascularity are  normal. No adenopathy. There is postoperative change in the right cervical-thoracic region. There is stable rightward deviation of the upper thoracic trachea. There is chronic right-sided acromioclavicular and coracoclavicular separation, unchanged. There is aortic atherosclerosis. IMPRESSION: No edema or consolidation.  There is aortic atherosclerosis. Postoperative changes noted in the right cervical-thoracic region. There is rightward deviation of the upper thoracic trachea. Question thyroid enlargement as the cause of this tracheal deviation. Appearance in this area is stable. Chronic acromioclavicular and coracoclavicular separation on the right. This appearance is stable from prior study. Aortic Atherosclerosis (ICD10-I70.0). Electronically Signed   By: Lowella Grip III M.D.   On: 08/28/2017 11:25   Ct Angio Chest Pe W/cm &/or Wo Cm  Result Date: 08/28/2017 CLINICAL DATA:  Back pain and elevated D-dimer. EXAM: CT ANGIOGRAPHY CHEST WITH CONTRAST TECHNIQUE: Multidetector CT imaging of the chest was performed using the standard protocol during bolus administration of intravenous contrast. Multiplanar CT image reconstructions and MIPs were obtained to evaluate the vascular anatomy. CONTRAST:  100 cc Isovue 370 intravenous contrast. COMPARISON:  CT chest dated December 29, 2005. FINDINGS: Cardiovascular: Satisfactory opacification of the pulmonary arteries to the segmental level. No evidence of pulmonary embolism. Normal heart size. No pericardial effusion. Normal thoracic aorta. Coronary, aortic arch, and branch vessel atherosclerotic vascular disease. Mediastinum/Nodes: No enlarged mediastinal, hilar, or axillary lymph nodes. Prior right hemithyroidectomy. Interval increase in size of the left thyroid lobe, now measuring up to 4.6 cm in transverse dimension, previously 3.5 cm, with increased rightward deviation of the trachea. No focal lesion is seen in the left thyroid lobe. The esophagus demonstrates no  significant findings. Lungs/Pleura: Stable 3 mm nodule in the right upper lobe. No new suspicious pulmonary nodule. No consolidation, pleural effusion, or pneumothorax. Upper Abdomen: Moderate hiatal hernia. No acute  abnormality. Interval increase in size of a somewhat ill defined low-density lesion in the right hepatic lobe, now measuring 19 mm, previously 12 mm. Musculoskeletal: No chest wall abnormality. No acute or significant osseous findings. Review of the MIP images confirms the above findings. IMPRESSION: 1. No evidence of acute pulmonary embolism. 2. Prior right hemithyroidectomy. Interval increase in size of the left thyroid lobe, now measuring up to 4.6 cm in transverse dimension, previously 3.5 cm, with increased rightward deviation of the trachea. No focal lesion is seen in the left thyroid lobe. Consider nonemergent thyroid ultrasound for further evaluation. 3. Interval increase in size of a low-density lesion in the right hepatic lobe, now 19 mm, previously 12 mm. Findings are favored to represent a simple cyst, however given the somewhat ill-defined margins, further evaluation with ultrasound is recommended. 4.  Aortic atherosclerosis (ICD10-I70.0). Electronically Signed   By: Titus Dubin M.D.   On: 08/28/2017 17:05    Procedures Procedures (including critical care time)  Medications Ordered in ED Medications  ibuprofen (ADVIL,MOTRIN) tablet 600 mg (600 mg Oral Given 08/28/17 1404)  iopamidol (ISOVUE-370) 76 % injection (100 mLs  Contrast Given 08/28/17 1613)     Initial Impression / Assessment and Plan / ED Course  I have reviewed the triage vital signs and the nursing notes.  Pertinent labs & imaging results that were available during my care of the patient were reviewed by me and considered in my medical decision making (see chart for details).     Patient presents to the ED with complaints of upper back pain that radiates to her anterior chest. States that the last time she  felt like this she had her heart attack. Patient currently denies any chest pain at this time but complains of persistent upper back pain. Patient with history of MI that is required PCI in the past. Patient history of diabetes, hypertension, hypercholesteremia. Patient has not had a recent stress test or cath.  On exam patient is overall well-appearing and nontoxic. Vital signs are reassuring. Patient is not tachycardic, no hypoxia, no tachypnea noted.  On exam lungs are clear to auscultation bilaterally. Abdominal exam is benign. No lower extremity edema or calf tenderness. The back pain is not reproducible with palpation.  Lab work reveals negative delta troponins. No leukocytosis noted. Kidney function is at baseline. Given patient's recent travel and upper back pain ordered d-dimer to rule out PE. The d-dimer returned elevated at 1.9. CTA of chest was ordered.  EKG reveals no acute ischemic changes and is unchanged from baseline.  Chest x-ray reveals no focal infiltrate. Does note some tracheal deviation concerning for enlarging thyroid. Patient with history of partial thyroidectomy in the past.  CTA of chest returns that shows no signs of PE. Does know enlarging left thyroid nodule that is causing some tracheal deviation. Also notes a liver cyst that is enlarged from prior studies that needs ultrasound rule out. Patient was informed of these findings.  Given patient's high risk factors including diabetes, hypertension, hyperlipidemia, history of MI, age, heart pathway score is 5. Feel the patient needs hospital admission for cardiac rule out. Patient is agreeable to admission.  Spoke with the family medicine teaching service who agrees to admission and will come to the ED to evaluated patient as admission orders. Patient remains hemodynamically stable this time and was updated on plan of care.    Final Clinical Impressions(s) / ED Diagnoses   Final diagnoses:  Atypical chest pain     New  Prescriptions New Prescriptions   No medications on file     Aaron Edelman 08/28/17 1745    Quintella Reichert, MD 09/02/17 1257

## 2017-08-28 NOTE — ED Triage Notes (Signed)
Pt here with left upper back pain and had a little feeling in chest.  Pt denies sob, but feels similar to how she felt the last time when she had a heart attack.  No chest pain now.

## 2017-08-28 NOTE — ED Notes (Signed)
PT denies chest pain. PT reports pain is confined to back (between shoulder blades). PT denies any associated symptoms.

## 2017-08-28 NOTE — H&P (Signed)
[facility] Admission History and Physical Service Pager: [phone]  Patient name: [name] Medical record number: [id number] Date of birth: 15-Feb-1941 Age: 76 y.o. Gender: female  Primary Care Provider: [name], MD Consultants: none Code Status: FULL  Chief Complaint: back pain  Assessment and Plan: ICA DAYE is a 76 y.o. female presenting with back pain. PMH is significant for CAD (s/p stent 6 years ago - [facility]), DM2, HTN, thyroid cancer with lobectomy, hepatic cyst.   Back pain: behind L scapula, patient reports feels similar to previous MI. D dimer 1.98, but CTA PE without PE. Trop negative x 2. EKG unchanged from previous. HEART score 5 due to previous MI, moderately suspicious story, age. Reports last stent was 6 years ago with Dr. Johnsie Cancel. Pain relieved by ibuprofen given in ED. Suspect likely MSK as patient relates this pain "feels like when she pulled a muscle," but given HEART score will observe for continued cardiac enzymes. Pain is not reproducible on exam, although patient reports it has resolved on my assessment.  -observe on tele, Dr. McDiarmid attending -trop x 3 -EKG in am -likely consult cardiology in the am  -nitro PRN -NPO after MN in case cardiology wants to intervene  CAD: Per Dr. Kyla Balzarine note (last seen in 2015), "angioplasty of the ostium of the PDA in 2001. She had recurrent angina in March 2014 and had a cutting balloon therapy of her diagonal branch by Dr. Lia Foyer. Hospitalized for SSCP 1/15 with R/o  Myovue nonischemic." -continue coreg -hold imdur while giving nitro PRN chest pain  HTN: on several agents as an outpatient. BP elevated on admission.  -continue home norvasc, coreg, lisinopril-HCTZ (19m-12.5mg dosed BID at home, given QD here)  Hx of thyroid cancer: per Endo outpatient notes - "The patient's thyroid enlargement was first discovered in 1992. At that time she had a right lobectomy done for  a nodule that showed Hashimoto thyroiditis and also a microscopic focus of papillary carcinoma on pathology. Seen in 2014 to have a left-sided thyroid enlargement related to Hashimoto thyroiditis. She has had an ultrasound exam in  03/2013 which showed a heterogenous left lobe without a discrete nodule and also some remnant of the right thyroid lobe." Recommended yearly follow up without synthroid. On CTA PE on admission with interval increase of left thyroid lobe from 3.5cm to 4.6cm, consider nonemergent [place]  -outpatient endo follow up, suggest thyroid [place] further eval  Liver cyst: radiology able to compare to previous CT chest 2007 - reports enlarged from 175mto 1968mLikely simple cyst, recommended further evaluation with [place]at[place] unaware of this finding on previous imaging, no abdominal pain.  -RUQ US [place]2: Last A1c 7.7 04/2017, will repeat. Home meds are metformin and Lantus 25U daily. Patient reports she took her Lantus already on day of admission. Will reduce Lantus to 12U while admitted and use sliding scale coverage.  -sensitive sliding scale -Lantus 12U -hold home metformin -repeat A1c  Anemia: baseline appears to be 10, 10.0 on admission. Last iron panel 2010, on iron supplementation. Consider repeating iron panel vs IV iron if limited compliance as an outpatient.  -CBC in am  -consider repeating iron panel  HLD: home med is lipitor 105m53mut given CAD, patient would likely benefit from high intensity statin, curious whether there has been intolerance in the past. -discuss with patient in AM re possible intolerance -continue lipitor  FEN/GI: NPO after MN for possible cardiac eval Prophylaxis: heparin subq  Disposition: admit to tele for ACS r/o  History of Present Illness:  [name] is a 76 y.o. female presenting with back pain behind her L scapula. She reports that this feels similar to her first MI back in 2001, but she also reports it is similar to a "pulled  muscle" pain. Came on this morning while she was walking around her room getting dressed to help her daughter at a daycare. Pain was worse with movements to put her clothes on. Hx of 2 MIs per patient. Reports some radiation to anterior chest. Patient not aware of liver cyst.   In ED, d dimer elevated and CTA PE negative. EKG unchanged, trop neg x 2.   Review Of Systems: Per HPI with the following additions: denies SOB, abd pain, dysuria, constipation, weakness.   ROS  Patient Active Problem List   Diagnosis Date Noted  . Acute left-sided low back pain without sciatica 05/13/2017  . Acute upper respiratory infection 02/14/2017  . Thickened nail 03/01/2016  . IBS (irritable bowel syndrome) 12/27/2014  . Left thyroid nodule 07/27/2014  . Thyroid enlargement 04/12/2013  . History of thyroid cancer 04/12/2013  . HYPOTHYROIDISM, UNSPECIFIED 02/19/2007  . Diabetes mellitus type 2, controlled ([place]) 02/19/2007  . HYPERCHOLESTEROLEMIA 02/19/2007  . ANEMIA, OTHER, UNSPECIFIED 02/19/2007  . CARPAL TUNNEL SYNDROME 02/19/2007  . HYPERTENSION, BENIGN SYSTEMIC 02/19/2007  . Coronary atherosclerosis 02/19/2007  . 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 Anemia   . Cor pulmonale ([place])   . Coronary artery disease    a. Remote PTCA of the PDA;  b. 02/2009 PCI/CBA to the D1;  c. 11/2011 Cath: LM nl, LAD 20p, 48m D1 patent, RI 20p, LCX nl, RCA 30d, EF 55-65%.  . CTS (carpal tunnel syndrome)   . Diabetes [place] type II [place]   . ETD (eustachian tube dysfunction)   . Gastritis   . GERD (gastroesophageal reflux disease)   . 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 History of thyroid cancer   . Hypercholesteremia   . Hypertension, benign   . Hypothyroid   . [place]ocardial infarction [place]   . Postmenopausal     Past Surgical History: Past Surgical History:  Procedure Laterality Date  . ANGIOPLASTY      PDA 90-40 %  . BARTHOLIN GLAND CYST EXCISION  09-13-2004   Keratosis- no CA  . BREAST BIOPSY     LEFT SPOT REMOVED  . CARDIAC CATHETERIZATION  03/15/2009   stenosis of diagonal branch of LAD- PTCA performed  . CHOLECYSTECTOMY  08-23-2002  . CORONARY ANGIOPLASTY    . ENDOMETRIAL BIOPSY  02-20-1998  . LEFT HEART CATHETERIZATION WITH CORONARY ANGIOGRAM N/A 11/26/2011   Procedure: LEFT HEART CATHETERIZATION WITH CORONARY ANGIOGRAM;  Surgeon: Peter M JMartinique MD;  [facility];  Service: Cardiovascular;  Laterality: N/A;  . PTCA  10/23/2000 and 03/15/09  . 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 TONSILLECTOMY    . TUBAL LIGATION      Social History: Social History  Substance Use Topics  . Smoking status: Former Smoker    Types: Cigarettes    Quit date: 12/23/1980  . Smokeless tobacco: Former USystems developer   Types: CClaredate: 08/23/1994  . Alcohol use No   Additional social history: of note, daughter works at [employer] in room with patient  Please also  refer to relevant sections of EMR.  Family History: Family History  Problem Relation Age of Onset  . Diabetes Mother        Deceased- hypoglycemic coma   . Hypertension Mother   . Other Father        unaware of his health history - died when pt was 76 yr old.  [name] Heart attack Sister   . Diabetes Mellitus II Brother   . Heart attack Brother   . Heart attack Brother   . Diabetes Mellitus II Brother   . Epilepsy Brother   . Breast cancer Daughter     Allergies and Medications: Allergies  Allergen Reactions  . Codeine Nausea And Vomiting  . Sulfamethoxazole-Trimethoprim Nausea And Vomiting  . Tramadol Nausea And Vomiting   No current facility-administered medications on file prior to encounter.    Current Outpatient Prescriptions on File Prior to Encounter  Medication Sig Dispense Refill  . amLODipine (NORVASC) 10 MG tablet TAKE ONE TABLET BY MOUTH DAILY (Patient taking  differently: Take 10 mg by mouth once a day) 90 tablet 1  . aspirin 81 MG chewable tablet Chew 81 mg by mouth daily.      [name] atorvastatin (LIPITOR) 20 MG tablet TAKE ONE TABLET BY MOUTH DAILY AT 6PM (Patient taking differently: Take 20 mg by mouth in the evening at 6 PM) 90 tablet 1  . carvedilol (COREG) 25 MG tablet TAKE ONE TABLET BY MOUTH TWICE DAILY WITH MEALS (Patient taking differently: Take 25 mg by mouth two times a day with meals) 180 tablet 1  . ferrous sulfate 325 (65 FE) MG tablet Take 325 mg by mouth daily with breakfast.    . Garlic 9767 MG CAPS Take 1,000 mg by mouth daily.    [name] ibuprofen (ADVIL,MOTRIN) 200 MG tablet Take 200 mg by mouth every 6 (six) hours as needed (for pain).     . isosorbide mononitrate (IMDUR) 60 MG 24 hr tablet TAKE ONE TABLET BY MOUTH DAILY (Patient taking differently: Take 60 mg by mouth once a day) 90 tablet 2  . LANTUS 100 UNIT/ML injection INJECT 25 UNITS SUBCUTANEOUSLY ONCE DAILY IN THE MORNING. HOLD IF GLUCOSE LEVEL IS LESS THAN 70 10 mL 3  . lisinopril-hydrochlorothiazide (ZESTORETIC) 20-12.5 MG tablet Take 2 tablets by mouth daily. (Patient taking differently: Take 1 tablet by mouth 2 (two) times daily. ) 180 tablet 2  . loratadine (CLARITIN) 10 MG tablet Take 10 mg by mouth daily as needed for allergies.     . metFORMIN (GLUCOPHAGE-XR) 500 MG 24 hr tablet TAKE TWO TABLETS BY MOUTH TWICE DAILY (Patient taking differently: Take 1,000 mg by mouth two times a day) 360 tablet 1  . Multiple Vitamin (MULTIVITAMIN WITH MINERALS) TABS tablet Take 1 tablet by mouth daily. Reported on 04/02/2016    . baclofen (LIORESAL) 10 MG tablet Take 0.5 tablets (5 mg total) by mouth 2 (two) times daily as needed for muscle spasms. (Patient not taking: Reported on 08/28/2017) 10 each 0  . Blood Glucose Monitoring Suppl (ONE TOUCH ULTRA MINI) w/Device KIT Check blood sugar TID  before meals. Diabetes Mellitus Type 2. E11.9 1 each 0  . fluticasone (FLONASE) 50 MCG/ACT nasal spray  Place 2 sprays into the nose daily as needed. For allergies (Patient not taking: Reported on 08/28/2017) 16 g 6  . glucose blood (ONE TOUCH ULTRA TEST) test strip Check blood sugar TID  before meals. Diabetes Mellitus Type 2. E11.9 100 each 4  . Insulin Syringe-Needle U-100 (  B-D INS SYR ULTRAFINE 1CC/31G) 31G X 5/16" 1 ML MISC USE TO INJECT INSULIN SUBCUTANEOUSLY DAILY AS INSTRUCTED 100 each 11  . nitroGLYCERIN (NITROSTAT) 0.4 MG SL tablet Place 1 tablet (0.4 mg total) under the tongue every 5 (five) minutes as needed for chest pain. (Patient not taking: Reported on 08/28/2017) 25 tablet 3  . ONETOUCH DELICA LANCETS 21V MISC Check blood sugar TID  before meals. Diabetes Mellitus Type 2. E11.9 100 each 4    Objective: BP (!) 122/48   Pulse 62   Temp 98.3 F (36.8 C) (Oral)   Resp 19   LMP 11/24/1992   SpO2 100%  Exam: General: obese female lying in bed in NAD.  Eyes: EOMI, PEERLA,  ENTM: MMM, poor dentition, uvulae midline Neck: supple, no JVD Cardiovascular: RRR, no murmur Respiratory: CTAB, easy WOB, no wheezing Gastrointestinal: SNTND, +BS MSK: no pain to palpation of entire spine or area of previous pain (between L scapula and spine)  Derm: no rashes Neuro: CN II-XII grossly intact, strength and sensation intact in all extremities Psych: mood and affect appropriate  Labs and Imaging: CBC BMET   Recent Labs Lab 08/28/17 1101  WBC 4.7  HGB 10.0*  HCT 32.7*  PLT 123*    Recent Labs Lab 08/28/17 1101  NA 142  K 4.3  CL 105  CO2 28  BUN 14  CREATININE 1.00  GLUCOSE 222*  CALCIUM 9.6     Trop neg x 2, D dimer 1.98  Sela Hilding, MD 08/28/2017, 5:31 PM PGY-2, [place] Intern pager: [phone], text pages welcome

## 2017-08-29 ENCOUNTER — Encounter (HOSPITAL_COMMUNITY): Payer: Self-pay | Admitting: Family Medicine

## 2017-08-29 ENCOUNTER — Other Ambulatory Visit: Payer: Self-pay

## 2017-08-29 ENCOUNTER — Observation Stay (HOSPITAL_COMMUNITY): Payer: Medicare Other

## 2017-08-29 DIAGNOSIS — M25512 Pain in left shoulder: Secondary | ICD-10-CM | POA: Diagnosis not present

## 2017-08-29 DIAGNOSIS — D649 Anemia, unspecified: Secondary | ICD-10-CM

## 2017-08-29 DIAGNOSIS — E1169 Type 2 diabetes mellitus with other specified complication: Secondary | ICD-10-CM | POA: Diagnosis not present

## 2017-08-29 DIAGNOSIS — E1159 Type 2 diabetes mellitus with other circulatory complications: Secondary | ICD-10-CM | POA: Diagnosis not present

## 2017-08-29 DIAGNOSIS — Z955 Presence of coronary angioplasty implant and graft: Secondary | ICD-10-CM

## 2017-08-29 DIAGNOSIS — I252 Old myocardial infarction: Secondary | ICD-10-CM | POA: Diagnosis not present

## 2017-08-29 DIAGNOSIS — E049 Nontoxic goiter, unspecified: Secondary | ICD-10-CM

## 2017-08-29 DIAGNOSIS — I700 Atherosclerosis of aorta: Secondary | ICD-10-CM | POA: Diagnosis not present

## 2017-08-29 DIAGNOSIS — K7689 Other specified diseases of liver: Secondary | ICD-10-CM

## 2017-08-29 DIAGNOSIS — I25119 Atherosclerotic heart disease of native coronary artery with unspecified angina pectoris: Secondary | ICD-10-CM

## 2017-08-29 DIAGNOSIS — I2510 Atherosclerotic heart disease of native coronary artery without angina pectoris: Secondary | ICD-10-CM | POA: Diagnosis not present

## 2017-08-29 DIAGNOSIS — R911 Solitary pulmonary nodule: Secondary | ICD-10-CM | POA: Diagnosis not present

## 2017-08-29 DIAGNOSIS — M5489 Other dorsalgia: Secondary | ICD-10-CM | POA: Diagnosis not present

## 2017-08-29 DIAGNOSIS — I10 Essential (primary) hypertension: Secondary | ICD-10-CM | POA: Diagnosis not present

## 2017-08-29 DIAGNOSIS — R0789 Other chest pain: Secondary | ICD-10-CM | POA: Diagnosis not present

## 2017-08-29 HISTORY — DX: Presence of coronary angioplasty implant and graft: Z95.5

## 2017-08-29 HISTORY — DX: Atherosclerosis of aorta: I70.0

## 2017-08-29 HISTORY — DX: Pain in left shoulder: M25.512

## 2017-08-29 HISTORY — DX: Solitary pulmonary nodule: R91.1

## 2017-08-29 LAB — HEPATIC FUNCTION PANEL
ALBUMIN: 3.4 g/dL — AB (ref 3.5–5.0)
ALK PHOS: 63 U/L (ref 38–126)
ALT: 15 U/L (ref 14–54)
AST: 17 U/L (ref 15–41)
BILIRUBIN TOTAL: 1 mg/dL (ref 0.3–1.2)
Bilirubin, Direct: 0.2 mg/dL (ref 0.1–0.5)
Indirect Bilirubin: 0.8 mg/dL (ref 0.3–0.9)
Total Protein: 6.6 g/dL (ref 6.5–8.1)

## 2017-08-29 LAB — BASIC METABOLIC PANEL
ANION GAP: 7 (ref 5–15)
BUN: 13 mg/dL (ref 6–20)
CHLORIDE: 104 mmol/L (ref 101–111)
CO2: 31 mmol/L (ref 22–32)
CREATININE: 0.9 mg/dL (ref 0.44–1.00)
Calcium: 9.7 mg/dL (ref 8.9–10.3)
GFR calc non Af Amer: >60 mL/min (ref >60)
Glucose, Bld: 93 mg/dL (ref 65–99)
Potassium: 3.8 mmol/L (ref 3.5–5.1)
SODIUM: 142 mmol/L (ref 135–145)

## 2017-08-29 LAB — GLUCOSE, CAPILLARY
Glucose-Capillary: 86 mg/dL (ref 65–99)
Glucose-Capillary: 135 mg/dL — ABNORMAL HIGH (ref 65–99)
Glucose-Capillary: 129 mg/dL — ABNORMAL HIGH (ref 65–99)

## 2017-08-29 LAB — CBC
HCT: 31.5 % — ABNORMAL LOW (ref 36.0–46.0)
HEMOGLOBIN: 9.8 g/dL — AB (ref 12.0–15.0)
MCH: 26.5 pg (ref 26.0–34.0)
MCHC: 31.1 g/dL (ref 30.0–36.0)
MCV: 85.1 fL (ref 78.0–100.0)
Platelets: 130 10*3/uL — ABNORMAL LOW (ref 150–400)
RBC: 3.7 MIL/uL — AB (ref 3.87–5.11)
RDW: 15.5 % (ref 11.5–15.5)
WBC: 5 10*3/uL (ref 4.0–10.5)

## 2017-08-29 LAB — TSH: TSH: 0.848 u[IU]/mL (ref 0.350–4.500)

## 2017-08-29 LAB — TROPONIN I

## 2017-08-29 LAB — HEMOGLOBIN A1C
Hgb A1c MFr Bld: 7.9 % — ABNORMAL HIGH (ref 4.8–5.6)
Mean Plasma Glucose: 180.03 mg/dL

## 2017-08-29 MED ORDER — ATORVASTATIN CALCIUM 40 MG PO TABS: 40.0000 mg | ORAL_TABLET | Freq: Every day | ORAL | 0 refills | Status: DC

## 2017-08-29 MED ORDER — INSULIN ASPART 100 UNIT/ML ~~LOC~~ SOLN
0.0000 [IU] | SUBCUTANEOUS | Status: DC
  Administered 2017-08-29: 1 [IU] via SUBCUTANEOUS

## 2017-08-29 MED ORDER — ATORVASTATIN CALCIUM 40 MG PO TABS: 40.0000 mg | ORAL_TABLET | Freq: Every day | ORAL | Status: DC

## 2017-08-29 NOTE — Progress Notes (Signed)
Call placed to C[facility] to notify of telemetry monitoring d/c.   

## 2017-08-29 NOTE — Progress Notes (Addendum)
I went to see patient in the hospital. She was admitted for back pain which she stated has resolved. She feels better now. She voiced concern about liver cyst which she was not previously aware of; this was found on an imaging she had in 2007 about 11 years ago. Repeat liver US suggested benign pathology. Recent CT chest also shows pulmonary nodule. I will follow-up d/c. I appreciate care provided by the inpatient team. Patient is in good hands.

## 2017-08-29 NOTE — Progress Notes (Signed)
Family Medicine Teaching Service Daily Progress Note Intern Pager: [phone]  Patient name: [name] Medical record number: [id number] Date of birth: [birth year] Age: 76 y.o. Gender: female  Primary Care Provider: [name], MD Consultants: Cardiology Code Status: Full  Pt Overview and Major Events to Date:  9/6: admission for ACS r/o   Assessment and Plan:  Back pain: behind L scapula, patient reports feels similar to previous MI. D dimer 1.98, but CTA PE without PE. Trop negative x 2. EKG unchanged from previous. HEART score 5 due to previous MI, moderately suspicious story, age. Reports last stent was 6 years ago with Dr. Johnsie Cancel. Pain relieved by ibuprofen given in ED. Suspect likely MSK as patient relates this pain "feels like when she pulled a muscle," but given HEART score will observe for continued cardiac enzymes. Pain is not reproducible on exam, although patient reports it has resolved on my assessment.  -observe on tele, Dr. McDiarmid attending -trop x 3 -cardiology consult   -nitro PRN -continue NPO status until cardiology eval  CAD: Per Dr. Kyla Balzarine note (last seen in 2015), "angioplasty of the ostium of the PDA in 2001. She had recurrent angina in March 2014 and had a cutting balloon therapy of her diagonal branch by Dr. Lia Foyer. Hospitalized for SSCP 1/15 with R/o Myovue nonischemic." -continue coreg -hold imdur while giving nitro PRN chest pain  HTN: on several agents as an outpatient. BP elevated on admission.  -continue home norvasc, coreg, lisinopril-HCTZ (20mg -12.5mg  dosed BID at home, given QD here)  Hx of thyroid cancer: per Endo outpatient notes - "The patient's thyroid enlargement was first discovered in 1992. At that time she had a rightlobectomy done for a nodule that showed Hashimoto thyroiditis and also a microscopic focus of papillary carcinoma on pathology. Seen in 2014 to have a left-sided thyroid enlargement related to Hashimoto  thyroiditis. She has had an ultrasoundexam in 03/2013 which showed a heterogenous left lobe without a discrete nodule and also some remnant of the right thyroid lobe." Recommended yearly follow up without synthroid. On CTA PE on admission with interval increase of left thyroid lobe from 3.5cm to 4.6cm, consider nonemergent [place].  -outpatient endo follow up, suggest thyroid US for further eval  Liver cyst: radiology able to compare to previous CT chest 2007 - reports enlarged from 34mm to 61mm. Likely simple cyst, recommended further evaluation with [place]. Patient unaware of this finding on previous imaging, no abdominal pain.  -RUQ US -hepatic function panel  DM2: A1c 7.9 on admission. Home meds are metformin and Lantus 25U daily. Patient reports she took her Lantus already on day of admission. Will reduce Lantus to 12U while admitted and use sliding scale coverage. CBG 93 this am. -sensitive sliding scale -Lantus 12U -hold home metformin -CBG monitoring  Anemia: baseline appears to be 10, 10.0 on admission. 9.8 9/7. Last iron panel 2010, on iron supplementation. Consider repeating iron panel vs IV iron if limited compliance as an outpatient.  -CBC   -consider repeating iron panel  HLD: home med is lipitor 20mg , but given CAD, patient would likely benefit from high intensity statin, patient denies intolerance in the past. -increase Lipitor to 40mg .  FEN/GI: NPO until cardiac eval  PPx: Heparin  Disposition: pending cardiac eval  Subjective:  Patient endorses no pain since received Ibuprofen in the ED although will occasionally feel similar pain with deep breathing. Denies, lightheadedness, dizziness, vision changes, N/V, diaphoresis. Patient cannot recall what her Lipitor dose is from memory but  denies any history of side effects and thinks it has been the same dose (20mg ) since originally prescribed.   Objective: Temp:  [98.1 F (36.7 C)-98.5 F (36.9 C)] 98.5 F (36.9 C) (09/07  0538) Pulse Rate:  [55-85] 55 (09/07 0538) Resp:  [16-24] 17 (09/07 0538) BP: (122-191)/(46-72) 150/51 (09/07 0538) SpO2:  [94 %-100 %] 99 % (09/07 0538) Weight:  [157 lb 4.8 oz (71.4 kg)-160 lb (72.6 kg)] 157 lb 4.8 oz (71.4 kg) (09/07 0538)  Physical Exam: General: awake, alert, in NAD Cardiovascular: RRR, no murmurs/rubs/gallops, no pain on palpation of chest Respiratory: CTA bilaterally, no increased WOB Abdomen: NABS, not tender to palpation, no CVA tenderness Extremities: WWP, no LE edema  Scheduled Meds: . amLODipine  10 mg Oral Daily  . aspirin  81 mg Oral Daily  . atorvastatin  40 mg Oral q1800  . carvedilol  25 mg Oral BID WC  . ferrous sulfate  325 mg Oral Q breakfast  . heparin  5,000 Units Subcutaneous Q8H  . lisinopril  20 mg Oral Daily   And  . hydrochlorothiazide  12.5 mg Oral Daily  . insulin aspart  0-9 Units Subcutaneous Q4H  . insulin glargine  12 Units Subcutaneous Daily  . multivitamin with minerals  1 tablet Oral Daily   Continuous Infusions: PRN Meds:.acetaminophen, nitroGLYCERIN  Laboratory:  Recent Labs Lab 08/28/17 1101 08/29/17 0808  WBC 4.7 5.0  HGB 10.0* 9.8*  HCT 32.7* 31.5*  PLT 123* 130*    Recent Labs Lab 08/28/17 1101 08/29/17 0808  NA 142 142  K 4.3 3.8  CL 105 104  CO2 28 31  BUN 14 13  CREATININE 1.00 0.90  CALCIUM 9.6 9.7  GLUCOSE 222* 93   Trop neg x2 EKG unchanged from previous.  Imaging/Diagnostic Tests:  CTA Chest 08/28/17 IMPRESSION: 1. No evidence of acute pulmonary embolism. 2. Prior right hemithyroidectomy. Interval increase in size of the left thyroid lobe, now measuring up to 4.6 cm in transverse dimension, previously 3.5 cm, with increased rightward deviation of the trachea. No focal lesion is seen in the left thyroid lobe. Consider nonemergent thyroid ultrasound for further evaluation. 3. Interval increase in size of a low-density lesion in the right hepatic lobe, now 19 mm, previously 12 mm.  Findings are favored to represent a simple cyst, however given the somewhat ill-defined margins, further evaluation with ultrasound is recommended. 4.  Aortic atherosclerosis (ICD10-I70.0).  CXR 08/28/17 IMPRESSION: 1. No edema or consolidation.  There is aortic atherosclerosis. 2. Postoperative changes noted in the right cervical-thoracic region. There is rightward deviation of the upper thoracic trachea. Question thyroid enlargement as the cause of this tracheal deviation. Appearance in this area is stable. 3. Chronic acromioclavicular and coracoclavicular separation on the right. This appearance is stable from prior study. 4. Aortic Atherosclerosis (ICD10-I70.0).  Rory Percy, DO 08/29/2017, 9:29 AM PGY-1, [place] Intern pager: [phone], text pages welcome

## 2017-08-29 NOTE — Progress Notes (Signed)
Pt ambulated to the elevators independently to see her family "out". Pt denies complaints, she is c/a/ox4 with good spirits and pain free.

## 2017-08-29 NOTE — Progress Notes (Signed)
Results for [name], [name] (MRN [id number]) as of 08/29/2017 12:11  Ref. Range 08/28/2017 23:40 08/29/2017 07:44 08/29/2017 11:34  Glucose-Capillary Latest Ref Range: 65 - 99 mg/dL 90 86 135 (H)  Noted that blood sugars have been less than 180 mg/dl. Recommend changing Novolog SENSITIVE correction scale to TID & HS if blood sugars continue to be less than 180 mg/dl.  Continue Lantus 12 units daily and titrate dosage as needed.   Harvel Ricks RN BSN CDE Diabetes Coordinator Pager: [phone]  8am-5pm

## 2017-08-29 NOTE — Progress Notes (Signed)
Diet order received during prior note completion.

## 2017-08-29 NOTE — Discharge Summary (Signed)
[facility] Discharge Summary  Patient name: [name] Medical record number: [id number] Date of birth: 02/02/1941 Age: 76 y.o. Gender: female Date of Admission: 08/28/2017  Date of Discharge: 08/29/2017 Admitting Physician: Leeanne Rio, MD  Primary Care Provider: [name], MD Consultants: Cardiology  Indication for Hospitalization: ACS r/o  Discharge Diagnoses/Problem List:  Back/Scapular pain CAD Hypertension Hx of thyroid cancer Liver cyst Diabetes Mellitus, type 2 Anemia Hyperlipidemia  Disposition: Home  Discharge Condition: Stable  Discharge Exam:  General: awake, alert, in NAD Cardiovascular: RRR, no murmurs/rubs/gallops, no pain on palpation of chest Respiratory: CTA bilaterally, no increased WOB Abdomen: NABS, not tender to palpation, no CVA tenderness Extremities: WWP, no LE edema  Brief Hospital Course:  76yo F with PMH significant for CAD s/p stent, DM2, HLD, HTN presents with back pain that she stated feels similar to pain she experienced with her previous MI in 2001. In ED, received EKG unchanged from prior. Trops negative x3. CTA chest negative for PE. CXR negative for infiltrate. Pain was relieved with ibuprofen in the ED. Cardiology was consulted and followed during this admission. Patient was observed overnight on telemetry without any acute events. Pain most likely musculoskeletal with no cardiac origin found. Of note, hepatic cyst noted on CTA Chest and followed up with RUQ U/S per radiology and hepatic function panel which was within normal limits.  RUQ U/S showed benign cysts with no further work up recommended.  Issues for Follow Up:  1. Ensure resolution of symptoms 2. Increased Lipitor to 80m from 270mas patient meets criteria for high intensity statin. Ensure compliance and toleration.  Significant Procedures: None  Significant Labs and Imaging:   Recent Labs Lab 08/28/17 1101 08/29/17 0808   WBC 4.7 5.0  HGB 10.0* 9.8*  HCT 32.7* 31.5*  PLT 123* 130*    Recent Labs Lab 08/28/17 1101 08/29/17 0808 08/29/17 1040  NA 142 142  --   K 4.3 3.8  --   CL 105 104  --   CO2 28 31  --   GLUCOSE 222* 93  --   BUN 14 13  --   CREATININE 1.00 0.90  --   CALCIUM 9.6 9.7  --   ALKPHOS  --   --  63  AST  --   --  17  ALT  --   --  15  ALBUMIN  --   --  3.4*   RUQ U/S IMPRESSION: 1. Status post cholecystectomy. 2. Numerous benign cysts and a small hepatic calcification, all with benign features. No specific follow-up is indicated.  CTA Chest 08/28/17 IMPRESSION: 1. No evidence of acute pulmonary embolism. 2. Prior right hemithyroidectomy. Interval increase in size of the left thyroid lobe, now measuring up to 4.6 cm in transverse dimension, previously 3.5 cm, with increased rightward deviation of the trachea. No focal lesion is seen in the left thyroid lobe. Consider nonemergent thyroid ultrasound for further evaluation. 3. Interval increase in size of a low-density lesion in the right hepatic lobe, now 19 mm, previously 12 mm. Findings are favored to represent a simple cyst, however given the somewhat ill-defined margins, further evaluation with ultrasound is recommended. 4. Aortic atherosclerosis (ICD10-I70.0).  CXR 08/28/17 IMPRESSION: 1. No edema or consolidation. There is aortic atherosclerosis. 2. Postoperative changes noted in the right cervical-thoracic region. There is rightward deviation of the upper thoracic trachea. Question thyroid enlargement as the cause of this tracheal deviation. Appearance in this area is stable. 3.  Chronic acromioclavicular and coracoclavicular separation on the right. This appearance is stable from prior study. 4. Aortic Atherosclerosis (ICD10-I70.0).  Results/Tests Pending at Time of Discharge: None  Discharge Medications:  Allergies as of 08/29/2017      Reactions   Codeine Nausea And Vomiting    Sulfamethoxazole-trimethoprim Nausea And Vomiting   Tramadol Nausea And Vomiting      Medication List    STOP taking these medications   baclofen 10 MG tablet Commonly known as:  LIORESAL   ibuprofen 200 MG tablet Commonly known as:  ADVIL,MOTRIN     TAKE these medications   amLODipine 10 MG tablet Commonly known as:  NORVASC TAKE ONE TABLET BY MOUTH DAILY What changed:  See the new instructions.   aspirin 81 MG chewable tablet Chew 81 mg by mouth daily.   atorvastatin 40 MG tablet Commonly known as:  LIPITOR Take 1 tablet (40 mg total) by mouth daily at 6 PM. What changed:  medication strength  See the new instructions.   carvedilol 25 MG tablet Commonly known as:  COREG TAKE ONE TABLET BY MOUTH TWICE DAILY WITH MEALS What changed:  See the new instructions.   ferrous sulfate 325 (65 FE) MG tablet Take 325 mg by mouth daily with breakfast.   fluticasone 50 MCG/ACT nasal spray Commonly known as:  FLONASE Place 2 sprays into the nose daily as needed. For allergies   Garlic 0017 MG Caps Take 1,000 mg by mouth daily.   glucose blood test strip Commonly known as:  ONE TOUCH ULTRA TEST Check blood sugar TID  before meals. Diabetes Mellitus Type 2. E11.9   Insulin Syringe-Needle U-100 31G X 5/16" 1 ML Misc Commonly known as:  B-D INS SYR ULTRAFINE 1CC/31G USE TO INJECT INSULIN SUBCUTANEOUSLY DAILY AS INSTRUCTED   isosorbide mononitrate 60 MG 24 hr tablet Commonly known as:  IMDUR TAKE ONE TABLET BY MOUTH DAILY What changed:  See the new instructions.   LANTUS 100 UNIT/ML injection Generic drug:  insulin glargine INJECT 25 UNITS SUBCUTANEOUSLY ONCE DAILY IN THE MORNING. HOLD IF GLUCOSE LEVEL IS LESS THAN 70   lisinopril-hydrochlorothiazide 20-12.5 MG tablet Commonly known as:  ZESTORETIC Take 2 tablets by mouth daily. What changed:  how much to take  when to take this   loratadine 10 MG tablet Commonly known as:  CLARITIN Take 10 mg by mouth daily  as needed for allergies.   metFORMIN 500 MG 24 hr tablet Commonly known as:  GLUCOPHAGE-XR TAKE TWO TABLETS BY MOUTH TWICE DAILY What changed:  See the new instructions.   multivitamin with minerals Tabs tablet Take 1 tablet by mouth daily. Reported on 04/02/2016   nitroGLYCERIN 0.4 MG SL tablet Commonly known as:  NITROSTAT Place 1 tablet (0.4 mg total) under the tongue every 5 (five) minutes as needed for chest pain.   ONE TOUCH ULTRA MINI w/Device Kit Check blood sugar TID  before meals. Diabetes Mellitus Type 2. C94.4   ONETOUCH DELICA LANCETS 96P Misc Check blood sugar TID  before meals. Diabetes Mellitus Type 2. E11.9            Discharge Care Instructions        Start     Ordered   08/29/17 0000  atorvastatin (LIPITOR) 40 MG tablet  Daily-1800     08/29/17 1717      Discharge Instructions: Please refer to Patient Instructions section of EMR for full details.  Patient was counseled important signs and symptoms that should prompt return to  medical care, changes in medications, dietary instructions, activity restrictions, and follow up appointments.   Follow-Up Appointments: Follow-up Information    [name], MD Follow up on 09/04/2017.   Specialty:  Family Medicine Why:  Follow up with Dr. Avon Gully on 09/04/17 at 10:30am. Please arrive 15 mins prior to appointment time. Contact information: [place] [address] [phone]           Rory Percy, DO 08/29/2017, 5:21 PM PGY-1, [place]

## 2017-08-29 NOTE — Assessment & Plan Note (Signed)
Seen CTA Chest 08/28/17

## 2017-08-29 NOTE — Progress Notes (Signed)
Page to Cards master requesting name of cards set to consult.   Per cards master, no order/call for this pt yet.   Page to intern pager [phone] to consult on NPO status and HTN.

## 2017-08-29 NOTE — Progress Notes (Signed)
Pt d/c with family at bedside, pt denies complaints or questions s/p review of AVS.   Pt escorted off unit via wheelchair.

## 2017-08-29 NOTE — Discharge Instructions (Signed)
You were admitted to rule out cardiac cause for your back pain. While you were admitted, cardiology followed your case and found no cardiac source for your pain, and is most likely muscular. Please follow up with your primary doctor on 09/04/17 at 10:30am.

## 2017-08-29 NOTE — Consult Note (Signed)
Cardiology Consult    Patient ID: [name] MRN: [id number], DOB/AGE: 1941/04/13   Admit date: 08/28/2017 Date of Consult: 08/29/2017  Primary Physician: [name], MD Primary Cardiologist: Johnsie Cancel Requesting Provider: Ardelia Mems Reason for Consultation: Shoulder pain  [name] is a 76 y.o. female who is being seen today for the evaluation of shoulder pain at the request of Dr. Ardelia Mems.   Patient Profile    76 yo female with PMH of CAD s/p PTCA to PDA ('01), and cutting balloon to diag Branch ('14), HTN, IDDM, HL, hypothyroidism who presented with left shoulder/back pain.   Past Medical History   Past Medical History:  Diagnosis Date  . Anemia   . Aortic arch atherosclerosis ([place]) 08/29/2017  . Cor pulmonale ([place])   . Coronary artery disease    a. Remote PTCA of the PDA;  b. 02/2009 PCI/CBA to the D1;  c. 11/2011 Cath: LM nl, LAD 20p, 67m, D1 patent, RI 20p, LCX nl, RCA 30d, EF 55-65%.  . CTS (carpal tunnel syndrome)   . Diabetes mellitus, type II ([place])   . ETD (eustachian tube dysfunction)   . Gastritis   . GERD (gastroesophageal reflux disease)   . 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 History of thyroid cancer   . Hypercholesteremia   . Hypertension, benign   . Hypothyroid   . Myocardial infarction ([place])   . Nodule of right lung 08/29/2017   3 mm stable nodule right upper lobe on CTA Chest 08/28/17  . Postmenopausal   . 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 Subscapular pain, left 08/29/2017    Past Surgical History:  Procedure Laterality Date  . ANGIOPLASTY     PDA 90-40 %  . BARTHOLIN GLAND CYST EXCISION  09-13-2004   Keratosis- no CA  . BREAST BIOPSY     LEFT SPOT REMOVED  . CARDIAC CATHETERIZATION  03/15/2009   stenosis of diagonal branch of LAD- PTCA performed  . CHOLECYSTECTOMY  08-23-2002  . CORONARY ANGIOPLASTY    . ENDOMETRIAL BIOPSY  02-20-1998  . LEFT HEART  CATHETERIZATION WITH CORONARY ANGIOGRAM N/A 11/26/2011   Procedure: LEFT HEART CATHETERIZATION WITH CORONARY ANGIOGRAM;  Surgeon: Peter M Martinique, MD;  Location: [facility] CATH LAB;  Service: Cardiovascular;  Laterality: N/A;  . PTCA  10/23/2000 and 03/15/09  . 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 TONSILLECTOMY    . TUBAL LIGATION       Allergies  Allergies  Allergen Reactions  . Codeine Nausea And Vomiting  . Sulfamethoxazole-Trimethoprim Nausea And Vomiting  . Tramadol Nausea And Vomiting    History of Present Illness    [name] is a 76 yo female with PMH of CAD s/p PTCA to PDA ('01), and cutting balloon to DM ('14), HTN, IDDM, HL, hypothyroidism. She is followed by Dr. Johnsie Cancel as an outpatient. Had angioplasty of ostium of PDA in 2001, and then cutting balloon to diag branch in 2014. Had a stress test in 2015 that was nonischemic with stable inferior scar noted. Was last seen in the office 4/15 by Dr. Johnsie Cancel and reported being in usual state of health.   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 Yesterday morning she was getting dressed to go to the daycare and developed pain behind her left scapula. Took some tylenol but the pain persisted. Was worse with movement. States she became concerned  when pain persisted as she had similar pain back in 2001 with her first cardiac episode.   She presented to the ED with symptoms, and states pain was relieved with ibuprofen. Labs showed troponin neg x3, stable electrolytes, Hgb 10. EKG showed SR with known TWI in lead II, III, aVF, v3, v4. No further episodes of shoulder pain this admission. Hgb A1c 7.9, and Lipid panel showed LDL 37. Ddimer was positive but CTA was negative for PE.   Inpatient Medications    . amLODipine  10 mg Oral Daily  . aspirin  81 mg Oral Daily  . atorvastatin  40 mg Oral q1800  . carvedilol  25 mg Oral BID WC  . ferrous sulfate  325 mg Oral Q breakfast  .  heparin  5,000 Units Subcutaneous Q8H  . lisinopril  20 mg Oral Daily   And  . hydrochlorothiazide  12.5 mg Oral Daily  . insulin aspart  0-9 Units Subcutaneous Q4H  . insulin glargine  12 Units Subcutaneous Daily  . multivitamin with minerals  1 tablet Oral Daily    Family History    Family History  Problem Relation Age of Onset  . Diabetes Mother        Deceased- hypoglycemic coma   . Hypertension Mother   . Other Father        unaware of his health history - died when pt was 76 yr old.  [name] Heart attack Sister   . Diabetes Mellitus II Brother   . Heart attack Brother   . Heart attack Brother   . Diabetes Mellitus II Brother   . Epilepsy Brother   . Breast cancer Daughter     Social History    Social History   Social History  . Marital status: Widowed    Spouse name: N/A  . Number of children: 6  . Years of education: 10   Occupational History  . Housekeeper [employer]   Social History Main Topics  . Smoking status: Former Smoker    Types: Cigarettes    Quit date: 12/23/1980  . Smokeless tobacco: Former Systems developer    Types: [place] date: 08/23/1994  . Alcohol use No  . Drug use: No  . Sexual activity: No   Other Topics Concern  . Not on file   Social History Narrative   Lives in [place] with Granddaughter.  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  She is not routinely exercising.   Has her GED     Review of Systems    See HPI  All other systems reviewed and are otherwise negative except as noted above.  Physical Exam    Blood pressure (!) 155/73, pulse 65, temperature 98.8 F (37.1 C), temperature source Oral, resp. rate 16, height 5' 2.5" (1.588 m), weight 157 lb 4.8 oz (71.4 kg), last menstrual period 11/24/1992, SpO2 99 %.  General: Pleasant, older AAF NAD Psych: Normal affect. Neuro: Alert and oriented X 3. Moves all extremities spontaneously. HEENT: Normal  Neck: Supple without bruits or JVD. Lungs:  Resp regular and unlabored, CTA. Heart:  RRR no s3, s4, or murmurs. Abdomen: Soft, non-tender, non-distended, BS + x 4.  Extremities: No clubbing, cyanosis or edema. DP/PT/Radials 2+ and equal bilaterally.  Labs    Troponin [facility] Test)  Recent Labs  08/28/17 1434  TROPIPOC 0.00    Recent Labs  08/29/17 0049 08/29/17 0425 08/29/17 1040  TROPONINI <0.03 <0.03 <0.03  Lab Results  Component Value Date   WBC 5.0 08/29/2017   HGB 9.8 (L) 08/29/2017   HCT 31.5 (L) 08/29/2017   MCV 85.1 08/29/2017   PLT 130 (L) 08/29/2017    Recent Labs Lab 08/29/17 0808 08/29/17 1040  NA 142  --   K 3.8  --   CL 104  --   CO2 31  --   BUN 13  --   CREATININE 0.90  --   CALCIUM 9.7  --   PROT  --  6.6  BILITOT  --  1.0  ALKPHOS  --  63  ALT  --  15  AST  --  17  GLUCOSE 93  --    Lab Results  Component Value Date   CHOL 111 (L) 08/06/2016   HDL 50 08/06/2016   LDLCALC 37 08/06/2016   TRIG 120 08/06/2016   Lab Results  Component Value Date   DDIMER 1.98 (H) 08/28/2017     Radiology Studies    Dg Chest 2 View  Result Date: 08/28/2017 CLINICAL DATA:  Flank and back pain EXAM: CHEST  2 VIEW COMPARISON:  August 24, 2014 and prior studies FINDINGS: Lungs are clear. Heart size and pulmonary vascularity are normal. No adenopathy. There is postoperative change in the right cervical-thoracic region. There is stable rightward deviation of the upper thoracic trachea. There is chronic right-sided acromioclavicular and coracoclavicular separation, unchanged. There is aortic atherosclerosis. IMPRESSION: No edema or consolidation.  There is aortic atherosclerosis. Postoperative changes noted in the right cervical-thoracic region. There is rightward deviation of the upper thoracic trachea. Question thyroid enlargement as the cause of this tracheal deviation. Appearance in this area is stable. Chronic acromioclavicular and coracoclavicular separation on the right. This appearance is stable from prior study. Aortic  Atherosclerosis (ICD10-I70.0). Electronically Signed   By: Lowella Grip III M.D.   On: 08/28/2017 11:25   Ct Angio Chest Pe W/cm &/or Wo Cm  Result Date: 08/28/2017 CLINICAL DATA:  Back pain and elevated D-dimer. EXAM: CT ANGIOGRAPHY CHEST WITH CONTRAST TECHNIQUE: Multidetector CT imaging of the chest was performed using the standard protocol during bolus administration of intravenous contrast. Multiplanar CT image reconstructions and MIPs were obtained to evaluate the vascular anatomy. CONTRAST:  100 cc Isovue 370 intravenous contrast. COMPARISON:  CT chest dated December 29, 2005. FINDINGS: Cardiovascular: Satisfactory opacification of the pulmonary arteries to the segmental level. No evidence of pulmonary embolism. Normal heart size. No pericardial effusion. Normal thoracic aorta. Coronary, aortic arch, and branch vessel atherosclerotic vascular disease. Mediastinum/Nodes: No enlarged mediastinal, hilar, or axillary lymph nodes. Prior right hemithyroidectomy. Interval increase in size of the left thyroid lobe, now measuring up to 4.6 cm in transverse dimension, previously 3.5 cm, with increased rightward deviation of the trachea. No focal lesion is seen in the left thyroid lobe. The esophagus demonstrates no significant findings. Lungs/Pleura: Stable 3 mm nodule in the right upper lobe. No new suspicious pulmonary nodule. No consolidation, pleural effusion, or pneumothorax. Upper Abdomen: Moderate hiatal hernia. No acute abnormality. Interval increase in size of a somewhat ill defined low-density lesion in the right hepatic lobe, now measuring 19 mm, previously 12 mm. Musculoskeletal: No chest wall abnormality. No acute or significant osseous findings. Review of the MIP images confirms the above findings. IMPRESSION: 1. No evidence of acute pulmonary embolism. 2. Prior right hemithyroidectomy. Interval increase in size of the left thyroid lobe, now measuring up to 4.6 cm in transverse dimension, previously  3.5 cm, with increased rightward  deviation of the trachea. No focal lesion is seen in the left thyroid lobe. Consider nonemergent thyroid ultrasound for further evaluation. 3. Interval increase in size of a low-density lesion in the right hepatic lobe, now 19 mm, previously 12 mm. Findings are favored to represent a simple cyst, however given the somewhat ill-defined margins, further evaluation with ultrasound is recommended. 4.  Aortic atherosclerosis (ICD10-I70.0). Electronically Signed   By: Titus Dubin M.D.   On: 08/28/2017 17:05   US Abdomen Limited Ruq  Result Date: 08/29/2017 CLINICAL DATA:  Follow-up liver cyst seen on CT 08/28/2017. Status post cholecystectomy. EXAM: ULTRASOUND ABDOMEN LIMITED RIGHT UPPER QUADRANT COMPARISON:  CT of the abdomen and pelvis 08/28/2017 FINDINGS: Gallbladder: Surgically absent. Common bile duct: Diameter: 8.7 mm Liver: Normal hepatic echotexture. A left hepatic cyst is 0.9 x 0.9 x 0.9 cm. A right hepatic cyst is 1.8 x 1.8 x 2.8 cm and appears simple, accounting for the CT abnormality. A small echogenic focus is identified in the right hepatic lobe, associated acoustic shadowing and correlating well with a focal calcification in the right hepatic lobe seen on CT exam. No suspicious liver lesions are identified. Portal vein is patent on color Doppler imaging with normal direction of blood flow towards the liver. IMPRESSION: 1. Status post cholecystectomy. 2. Numerous benign cysts and a small hepatic calcification, all with benign features. No specific follow-up is indicated. Electronically Signed   By: Nolon Nations M.D.   On: 08/29/2017 10:13    ECG & Cardiac Imaging    EKG: SR with known TWI in lead II, III, aVF, v3, v4  Echo: 11/07  SUMMARY - Overall left ventricular systolic function was normal. Left    ventricular ejection fraction was estimated to be 55 %. There    were no left ventricular regional wall motion abnormalities.  Assessment &  Plan    76 yo female with PMH of CAD s/p PTCA to PDA ('01), and cutting balloon to DM ('14), HTN, NIDDM, HL, hypothyroidism who presented with left shoulder/back pain.   1. 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 Trop neg x3, EKG without acute changes. Symptoms resolved with Ibuprofen. No further episodes.    2. HTN: Not well controlled on multiple therapies. Would resume home Imdur today.    3. IDDM: Hgb A1c 7.9 -- on metformin, and insulin at home  4. HL: on Lipitor 40mg  -- LDL 37  5. Anemia: Hgb 10>>9.8 today -- work up per primary team  6. CAD: Hx of shoulder pain with previous cardiac episode, but no angina reported.  -- on good medical therapy with BB, ACEi. Imdur, ASA and statin   Barnet Pall, NP-C Pager [phone] 08/29/2017, 1:50 PM

## 2017-09-04 ENCOUNTER — Ambulatory Visit (INDEPENDENT_AMBULATORY_CARE_PROVIDER_SITE_OTHER): Payer: Medicare Other | Admitting: Internal Medicine

## 2017-09-04 ENCOUNTER — Encounter: Payer: Self-pay | Admitting: Internal Medicine

## 2017-09-04 DIAGNOSIS — E041 Nontoxic single thyroid nodule: Secondary | ICD-10-CM

## 2017-09-04 DIAGNOSIS — M25512 Pain in left shoulder: Secondary | ICD-10-CM | POA: Diagnosis not present

## 2017-09-04 NOTE — Progress Notes (Signed)
   Subjective:   Patient: [name]       Birthdate: Dec 06, 1941       MRN: [id number]      HPI  [name] is a 76 y.o. female presenting for hospital f/u.   Patient recently admitted from 09/06-09/07 for ACS rule out. She presented to ED with back pain that felt similar to that she experienced during an MI in 2001. In ED, she was found to have normal EKG, neg trop x3, neg CTA chest and neg CXR. Pain was relieved by ibuprofen. Cardiology was consulted and felt observation with telemetry was appropriate given cardiac history. No cardiac origin was found for pain, and she was subsequently discharged. Lipitor was increased to 40mg  during admission as patient meeting criteria for high intensity statin, but no other medication changes were made.   Since discharge, patient says she has been feeling very well. Denies any pain after receiving ibuprofen in ED. Reports a little bit of back soreness while still admitted but says this has resolved as well. Has increased Lipitor to 40mg  as instructed. Denies chest pain, SOB.    Patient was also noted to have possible L thyroid enlargement while admitted. She has a history of partial thyroidectomy for which she is followed by Dr. Elayne Snare. She sees him yearly. Next appt is in 01/2018. Patient denies any issues swallowing. Has not noticed any swelling or abnormalities herself but is amenable to seeing Dr. Dwyane Dee sooner to make sure nothing has changed.   Smoking status reviewed. Patient is former smoker.   Review of Systems See HPI.     Objective:  Physical Exam  Constitutional: She is oriented to person, place, and time and well-developed, well-nourished, and in no distress.  HENT:  Head: Normocephalic and atraumatic.  Eyes: Conjunctivae and EOM are normal. Right eye exhibits no discharge. Left eye exhibits no discharge.  Neck: Normal range of motion. Neck supple. No thyromegaly present.  Cardiovascular: Normal rate, regular rhythm and  normal heart sounds.   No murmur heard. Pulmonary/Chest: Effort normal and breath sounds normal. No respiratory distress. She has no wheezes.  Musculoskeletal:  No TTP of back. Normal ROM. Able to sit, stand, and walk unassisted.   Lymphadenopathy:    She has no cervical adenopathy.  Neurological: She is alert and oriented to person, place, and time.  Skin: Skin is warm.  Psychiatric: Affect and judgment normal.      Assessment & Plan:  Left thyroid nodule Patient already followed by Dr. Elayne Snare due to history of thyroid cancer. Agreeable to scheduling appt with him sooner than annual visit (which is not until February 2019). Will call his office today to schedule appt.   Subscapular pain, left No pain noted since receiving ibuprofen in ED. Non-tender on exam today. Feeling very well since hospital discharge.  - Schedule general follow-up for chronic issues with Dr. Gwendlyn Deutscher (PCP) at patient's earliest convenience   Adin Hector, MD, MPH PGY-3 [facility] Pager [phone]

## 2017-09-04 NOTE — Assessment & Plan Note (Signed)
No pain noted since receiving ibuprofen in ED. Non-tender on exam today. Feeling very well since hospital discharge.  - Schedule general follow-up for chronic issues with Dr. Gwendlyn Deutscher (PCP) at patient's earliest convenience

## 2017-09-04 NOTE — Assessment & Plan Note (Signed)
Patient already followed by Dr. Elayne Snare due to history of thyroid cancer. Agreeable to scheduling appt with him sooner than annual visit (which is not until February 2019). Will call his office today to schedule appt.

## 2017-09-04 NOTE — Patient Instructions (Addendum)
It was nice meeting you today Ms. Digilio!  Please schedule an appointment with Dr. Ronnie Derby office to discuss your thyroid. His office phone number is [phone].   If you have any questions or concerns, please feel free to call the clinic.   Be well,  Dr. Avon Gully

## 2017-09-29 ENCOUNTER — Encounter: Payer: Self-pay | Admitting: Family Medicine

## 2017-09-30 ENCOUNTER — Telehealth: Payer: Self-pay | Admitting: Family Medicine

## 2017-09-30 ENCOUNTER — Encounter: Payer: Self-pay | Admitting: Family Medicine

## 2017-09-30 ENCOUNTER — Ambulatory Visit (INDEPENDENT_AMBULATORY_CARE_PROVIDER_SITE_OTHER): Payer: Medicare Other | Admitting: Family Medicine

## 2017-09-30 VITALS — BP 130/62 | HR 80 | Temp 98.3°F | Ht 63.0 in | Wt 160.0 lb

## 2017-09-30 DIAGNOSIS — D649 Anemia, unspecified: Secondary | ICD-10-CM

## 2017-09-30 DIAGNOSIS — E1159 Type 2 diabetes mellitus with other circulatory complications: Secondary | ICD-10-CM | POA: Diagnosis not present

## 2017-09-30 DIAGNOSIS — Z23 Encounter for immunization: Secondary | ICD-10-CM

## 2017-09-30 DIAGNOSIS — E041 Nontoxic single thyroid nodule: Secondary | ICD-10-CM

## 2017-09-30 DIAGNOSIS — I10 Essential (primary) hypertension: Secondary | ICD-10-CM | POA: Diagnosis not present

## 2017-09-30 LAB — POCT HEMOGLOBIN: HEMOGLOBIN: 9.7 g/dL — AB (ref 12.2–16.2)

## 2017-09-30 MED ORDER — ZOSTER VAC RECOMB ADJUVANTED 50 MCG/0.5ML IM SUSR
0.5000 mL | Freq: Once | INTRAMUSCULAR | 1 refills | Status: AC
Start: 2017-09-30 — End: 2017-09-30

## 2017-09-30 NOTE — Progress Notes (Addendum)
Subjective:     Patient ID: [name], female   DOB: 1941/09/29, 76 y.o.   MRN: [id number]  HPI DM2/HTN: Here for follow-up. No concern. She is compliant with her meds. Thyroid nodule: She is yet to follow-up with her endocrinologist. She stated her next appointment is in Jan. Anemia: She is compliant with her iron pills. Denies any concern. Here for follow-up.  Current Outpatient Prescriptions on File Prior to Visit  Medication Sig Dispense Refill  . amLODipine (NORVASC) 10 MG tablet TAKE ONE TABLET BY MOUTH DAILY (Patient taking differently: Take 10 mg by mouth once a day) 90 tablet 1  . aspirin 81 MG chewable tablet Chew 81 mg by mouth daily.      [name] atorvastatin (LIPITOR) 40 MG tablet Take 1 tablet (40 mg total) by mouth daily at 6 PM. 30 tablet 0  . carvedilol (COREG) 25 MG tablet TAKE ONE TABLET BY MOUTH TWICE DAILY WITH MEALS (Patient taking differently: Take 25 mg by mouth two times a day with meals) 180 tablet 1  . ferrous sulfate 325 (65 FE) MG tablet Take 325 mg by mouth daily with breakfast.    . Garlic 0300 MG CAPS Take 1,000 mg by mouth daily.    [name] glucose blood (ONE TOUCH ULTRA TEST) test strip Check blood sugar TID  before meals. Diabetes Mellitus Type 2. E11.9 100 each 4  . isosorbide mononitrate (IMDUR) 60 MG 24 hr tablet TAKE ONE TABLET BY MOUTH DAILY (Patient taking differently: Take 60 mg by mouth once a day) 90 tablet 2  . LANTUS 100 UNIT/ML injection INJECT 25 UNITS SUBCUTANEOUSLY ONCE DAILY IN THE MORNING. HOLD IF GLUCOSE LEVEL IS LESS THAN 70 10 mL 3  . lisinopril-hydrochlorothiazide (ZESTORETIC) 20-12.5 MG tablet Take 2 tablets by mouth daily. (Patient taking differently: Take 1 tablet by mouth 2 (two) times daily. ) 180 tablet 2  . metFORMIN (GLUCOPHAGE-XR) 500 MG 24 hr tablet TAKE TWO TABLETS BY MOUTH TWICE DAILY (Patient taking differently: Take 1,000 mg by mouth two times a day) 360 tablet 1  . Multiple Vitamin (MULTIVITAMIN WITH MINERALS) TABS tablet Take 1  tablet by mouth daily. Reported on 04/02/2016    . Blood Glucose Monitoring Suppl (ONE TOUCH ULTRA MINI) w/Device KIT Check blood sugar TID  before meals. Diabetes Mellitus Type 2. E11.9 1 each 0  . fluticasone (FLONASE) 50 MCG/ACT nasal spray Place 2 sprays into the nose daily as needed. For allergies (Patient not taking: Reported on 08/28/2017) 16 g 6  . Insulin Syringe-Needle U-100 (B-D INS SYR ULTRAFINE 1CC/31G) 31G X 5/16" 1 ML MISC USE TO INJECT INSULIN SUBCUTANEOUSLY DAILY AS INSTRUCTED 100 each 11  . loratadine (CLARITIN) 10 MG tablet Take 10 mg by mouth daily as needed for allergies.     . nitroGLYCERIN (NITROSTAT) 0.4 MG SL tablet Place 1 tablet (0.4 mg total) under the tongue every 5 (five) minutes as needed for chest pain. (Patient not taking: Reported on 08/28/2017) 25 tablet 3  . ONETOUCH DELICA LANCETS 92Z MISC Check blood sugar TID  before meals. Diabetes Mellitus Type 2. E11.9 100 each 4   No current facility-administered medications on file prior to visit.    Past Medical History:  Diagnosis Date  . Anemia   . Aortic arch atherosclerosis ([place]) 08/29/2017  . Atypical chest pain 11/25/2011  . Cor pulmonale ([place])   . Coronary artery disease    a. Remote PTCA of the PDA;  b. 02/2009 PCI/CBA to the D1;  c.  11/2011 Cath: LM nl, LAD 20p, 29m D1 patent, RI 20p, LCX nl, RCA 30d, EF 55-65%.  . CTS (carpal tunnel syndrome)   . Diabetes mellitus, type II ([place]   . ETD (eustachian tube dysfunction)   . Gastritis   . GERD (gastroesophageal reflux disease)   . 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 History of thyroid cancer   . Hypercholesteremia   . Hypertension, benign   . Hypothyroid   . Myocardial infarction ([place]   . Nodule of right lung 08/29/2017   3 mm stable nodule right upper lobe on CTA Chest 08/28/17  . Postmenopausal   . 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 Subscapular pain, left 08/29/2017     Review of  Systems  Respiratory: Negative.   Cardiovascular: Negative.   Gastrointestinal: Negative.   Musculoskeletal: Negative.   All other systems reviewed and are negative.      Objective:   Physical Exam  Constitutional: She appears well-developed. No distress.  Neck: Neck supple. No thyromegaly present.  Cardiovascular: Normal rate, regular rhythm and normal heart sounds.   No murmur heard. Pulmonary/Chest: Effort normal and breath sounds normal. No respiratory distress. She has no wheezes.  Abdominal: Soft. Bowel sounds are normal. She exhibits no distension and no mass. There is no tenderness.  Musculoskeletal: Normal range of motion. She exhibits no edema.  Neurological: She is alert.  Nursing note and vitals reviewed.      Assessment:     DM2 HTN Thyroid nodule Anemia    Plan:     Check problem list.  Flu shot given. Shingrix recommended. Script given for this.

## 2017-09-30 NOTE — Patient Instructions (Signed)
It was nice seeing you today. We gave you your flu vaccination today. I have also sent shingrix to your pharmacy for shingles vaccination. Your next dose after the first should be in the next 2-4 months. I will like to see you back in 3 months.

## 2017-09-30 NOTE — Assessment & Plan Note (Signed)
I recommended thyroid US. Patient stated she will prefer to wait till she sees her endocrinologist in Jan since they are already managing this. She denies any heat or cold intolerance. No weight change. F/U with endo as planned.

## 2017-09-30 NOTE — Assessment & Plan Note (Signed)
Likely iron deficiency. POC hemoglobin stable at 9.5 Continue ferrous sulphate. At next visit we will check anemia panel.

## 2017-09-30 NOTE — Assessment & Plan Note (Signed)
Compliant with meds. Continue current regimen. F/U in 2-3 months for A1C check.

## 2017-09-30 NOTE — Telephone Encounter (Signed)
HIPPA compliant message left.  

## 2017-09-30 NOTE — Assessment & Plan Note (Signed)
BP looks good. Continue current regimen. 

## 2017-10-08 ENCOUNTER — Other Ambulatory Visit: Payer: Self-pay | Admitting: Family Medicine

## 2017-10-08 DIAGNOSIS — Z1231 Encounter for screening mammogram for malignant neoplasm of breast: Secondary | ICD-10-CM

## 2017-10-14 ENCOUNTER — Other Ambulatory Visit: Payer: Self-pay | Admitting: Family Medicine

## 2017-10-16 ENCOUNTER — Ambulatory Visit: Payer: Medicare Other | Admitting: *Deleted

## 2017-10-22 ENCOUNTER — Ambulatory Visit: Payer: Medicare Other | Admitting: *Deleted

## 2017-10-27 ENCOUNTER — Other Ambulatory Visit: Payer: Self-pay | Admitting: Family Medicine

## 2017-11-11 ENCOUNTER — Ambulatory Visit
Admission: RE | Admit: 2017-11-11 | Discharge: 2017-11-11 | Disposition: A | Discharge status: Home / Self Care | Payer: Medicare Other | Source: Ambulatory Visit | Attending: Family Medicine | Admitting: Family Medicine

## 2017-11-11 DIAGNOSIS — Z1231 Encounter for screening mammogram for malignant neoplasm of breast: Secondary | ICD-10-CM

## 2018-01-13 ENCOUNTER — Other Ambulatory Visit: Payer: Self-pay | Admitting: Family Medicine

## 2018-01-22 LAB — HM DIABETES EYE EXAM

## 2018-01-23 ENCOUNTER — Other Ambulatory Visit (INDEPENDENT_AMBULATORY_CARE_PROVIDER_SITE_OTHER): Payer: Medicare Other

## 2018-01-23 DIAGNOSIS — E063 Autoimmune thyroiditis: Secondary | ICD-10-CM | POA: Diagnosis not present

## 2018-01-23 LAB — TSH: TSH: 0.73 u[IU]/mL (ref 0.35–4.50)

## 2018-01-23 LAB — T4, FREE: Free T4: 0.86 ng/dL (ref 0.60–1.60)

## 2018-01-25 ENCOUNTER — Other Ambulatory Visit: Payer: Self-pay | Admitting: Family Medicine

## 2018-01-26 NOTE — Telephone Encounter (Signed)
Medication clarification completed. She is taking Lipitor 20 mg qd and not 40. Will update.

## 2018-01-28 ENCOUNTER — Encounter: Payer: Self-pay | Admitting: Endocrinology

## 2018-01-28 ENCOUNTER — Ambulatory Visit: Payer: Medicare Other | Admitting: Endocrinology

## 2018-01-28 VITALS — BP 136/68 | HR 75 | Ht 63.0 in | Wt 155.8 lb

## 2018-01-28 DIAGNOSIS — E063 Autoimmune thyroiditis: Secondary | ICD-10-CM | POA: Diagnosis not present

## 2018-01-28 NOTE — Progress Notes (Signed)
Patient ID: [name], female   DOB: [birth year], 77 y.o.   MRN: [id number]    Reason for Appointment: Goiter, followup    History of Present Illness:   The patient's thyroid enlargement was first discovered in 1992 At that time she had a right lobectomy done for a nodule that showed Hashimoto thyroiditis and also a microscopic focus of papillary carcinoma on pathology.. Since then she had been on thyroid suppression  She was seen in consultation initially in 2014 and was felt to have a left-sided thyroid enlargement related to [place] thyroiditis She has had an ultrasound exam in  03/2013 which showed a heterogenous left lobe without a discrete nodule and also some remnant of the right thyroid lobe Her TSH level was 0.54 and she was advised to  leave off the levothyroxine  She is now here for regular follow-up  No complaints of local discomfort or pressure sensation, no difficulty swallowing She feels well subjectively Her TSH is consistently  normal  Labs:  Lab Results  Component Value Date   TSH 0.73 01/23/2018   TSH 0.848 08/29/2017   TSH 0.77 01/24/2017   FREET4 0.86 01/23/2018   FREET4 0.86 01/24/2017   FREET4 0.84 01/24/2016       Allergies as of 01/28/2018      Reactions   Codeine Nausea And Vomiting   Sulfamethoxazole-trimethoprim Nausea And Vomiting   Tramadol Nausea And Vomiting      Medication List        Accurate as of 01/28/18 10:03 AM. Always use your most recent med list.          amLODipine 10 MG tablet Commonly known as:  NORVASC TAKE ONE TABLET BY MOUTH DAILY   aspirin 81 MG chewable tablet Chew 81 mg by mouth daily.   atorvastatin 20 MG tablet Commonly known as:  LIPITOR TAKE ONE TABLET BY MOUTH DAILY AT 6PM   carvedilol 25 MG tablet Commonly known as:  COREG TAKE ONE TABLET BY MOUTH TWICE DAILY WITH MEALS   ferrous sulfate 325 (65 FE) MG tablet Take 325 mg by mouth daily with breakfast.   fluticasone 50 MCG/ACT  nasal spray Commonly known as:  FLONASE Place 2 sprays into the nose daily as needed. For allergies   Garlic 6222 MG Caps Take 1,000 mg by mouth daily.   glucose blood test strip Commonly known as:  ONE TOUCH ULTRA TEST Check blood sugar TID  before meals. Diabetes Mellitus Type 2. E11.9   Insulin Syringe-Needle U-100 31G X 5/16" 1 ML Misc Commonly known as:  B-D INS SYR ULTRAFINE 1CC/31G USE TO INJECT INSULIN SUBCUTANEOUSLY DAILY AS INSTRUCTED   isosorbide mononitrate 60 MG 24 hr tablet Commonly known as:  IMDUR TAKE ONE TABLET BY MOUTH DAILY   LANTUS 100 UNIT/ML injection Generic drug:  insulin glargine INJECT 25 UNITS SUBCUTANEOUSLY ONCE DAILY IN THE MORNING. HOLD IF GLUCOSE LEVEL IS LESS THAN 70   lisinopril-hydrochlorothiazide 20-12.5 MG tablet Commonly known as:  PRINZIDE,ZESTORETIC TAKE TWO TABLETS BY MOUTH DAILY   loratadine 10 MG tablet Commonly known as:  CLARITIN Take 10 mg by mouth daily as needed for allergies.   metFORMIN 500 MG 24 hr tablet Commonly known as:  GLUCOPHAGE-XR TAKE TWO TABLETS BY MOUTH TWICE A DAY   multivitamin with minerals Tabs tablet Take 1 tablet by mouth daily. Reported on 04/02/2016   nitroGLYCERIN 0.4 MG SL tablet Commonly known as:  NITROSTAT Place 1 tablet (0.4 mg total) under the tongue  every 5 (five) minutes as needed for chest pain.   ONE TOUCH ULTRA MINI w/Device Kit Check blood sugar TID  before meals. Diabetes Mellitus Type 2. N27.7   ONETOUCH DELICA LANCETS 82U Misc Check blood sugar TID  before meals. Diabetes Mellitus Type 2. E11.9       Allergies:  Allergies  Allergen Reactions  . Codeine Nausea And Vomiting  . Sulfamethoxazole-Trimethoprim Nausea And Vomiting  . Tramadol Nausea And Vomiting    Past Medical History:  Diagnosis Date  . Anemia   . Aortic arch atherosclerosis ([place]) 08/29/2017  . Atypical chest pain 11/25/2011  . Cor pulmonale ([place])   . Coronary artery disease    a. Remote PTCA of the PDA;  b.  02/2009 PCI/CBA to the D1;  c. 11/2011 Cath: LM nl, LAD 20p, 20m D1 patent, RI 20p, LCX nl, RCA 30d, EF 55-65%.  . CTS (carpal tunnel syndrome)   . Diabetes mellitus, type II ([place]   . ETD (eustachian tube dysfunction)   . Gastritis   . GERD (gastroesophageal reflux disease)   . 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 History of thyroid cancer   . Hypercholesteremia   . Hypertension, benign   . Hypothyroid   . Myocardial infarction ([place]   . Nodule of right lung 08/29/2017   3 mm stable nodule right upper lobe on CTA Chest 08/28/17  . Postmenopausal   . 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 Subscapular pain, left 08/29/2017    Past Surgical History:  Procedure Laterality Date  . ANGIOPLASTY     PDA 90-40 %  . BARTHOLIN GLAND CYST EXCISION  09-13-2004   Keratosis- no CA  . BREAST BIOPSY     LEFT SPOT REMOVED  . BREAST EXCISIONAL BIOPSY Left   . CARDIAC CATHETERIZATION  03/15/2009   stenosis of diagonal branch of LAD- PTCA performed  . CHOLECYSTECTOMY  08-23-2002  . CORONARY ANGIOPLASTY    . ENDOMETRIAL BIOPSY  02-20-1998  . LEFT HEART CATHETERIZATION WITH CORONARY ANGIOGRAM N/A 11/26/2011   Procedure: LEFT HEART CATHETERIZATION WITH CORONARY ANGIOGRAM;  Surgeon: Peter M JMartinique MD;  [facility];  Service: Cardiovascular;  Laterality: N/A;  . PTCA  10/23/2000 and 03/15/09  . 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 TONSILLECTOMY    . TUBAL LIGATION      Family History  Problem Relation Age of Onset  . Diabetes Mother        Deceased- hypoglycemic coma   . Hypertension Mother   . Other Father        unaware of his health history - died when pt was 77yr old.  .Marland KitchenHeart attack Sister   . Diabetes Mellitus II Brother   . Heart attack Brother   . Heart attack Brother   . Diabetes Mellitus II Brother   . Epilepsy Brother   . Breast cancer Daughter     Social History:   reports that she quit smoking about 37 years ago. Her smoking use included cigarettes. She quit smokeless tobacco use about 23 years ago. Her smokeless tobacco use included chew. She reports that she does not drink alcohol or use drugs.   Review of Systems:  There is a  history of high blood pressure.              Has Diabetes, she is on insulin and last  A1c was 7.9 She  is followed by her PCP and is due for an upcoming appointment       Examination:   BP 136/68 (BP Location: Left Arm, Patient Position: Sitting, Cuff Size: Normal)   Pulse 75   Ht '5\' 3"'  (1.6 m)   Wt 155 lb 12.8 oz (70.7 kg)   LMP 11/24/1992   SpO2 98%   BMI 27.60 kg/m         Thyroid exam: Left lobe is barely palpable Right lobe is enlarged about 1.5 times normal, somewhat firm No lymph nodes felt in the neck  Biceps reflexes are normal   Assessment/Plan:  History of Hashimoto thyroiditis as shown on previous surgery of the right lobe  She has a small goiter, however this appears to be smaller progressively and only just palpable today especially on the right side  She continues to be euthyroid with TSH below 1.0  She will come back as needed and do not need any active follow-up for her thyroid   DIABETES: She appears to have consistently poor control and she will discuss with her PCP if they need to refer her here for diabetes management  Elayne Snare 01/28/2018

## 2018-02-21 ENCOUNTER — Other Ambulatory Visit: Payer: Self-pay | Admitting: Family Medicine

## 2018-03-03 NOTE — Progress Notes (Signed)
   [facility] Phone: [phone]   Date of Visit: 03/04/2018   HPI:  HTN: - noticed that her BP is increased in the past week. It would fluctuate from normal to elevated 150s/100 but then would decrease to normal valaues  - no headaches, no blurred vision, no chest pain, no shortness of breath.  - checks BP every morning - no changes in diet or exercise. No canned foods or deli meats. No additional salt in food - compliant with medications: Norvasc 10mg  daily, Coreg 25mg  BID, Imdur 60mg  daily, Lisinopril-HCTZ 20-12.5 daily   Anemia:  - takes iron once daily - no blood in stool or urine - POC hgb today 9.2 which is stable from her prior values - this is thought to be iron deficiency anemia.  - colonoscopy 06/2013 with few diverticula on the sigmoid colon, internal and external hemorrhoids.   DM2:  - compliant with medication: Metformin and Lantus 25 units daily  - cbgs about 130-140 in the morning. Lowest cbg was 97. No  Polyuria or polydipsia  - due for eye exam  ROS: See HPI.  [place]:  PMH HTN Cor Pulmonale Hypothyroidism  DM2 HLD  PHYSICAL EXAM: BP (!) 146/60   Pulse 70   Temp 98.2 F (36.8 C) (Oral)   Wt 156 lb (70.8 kg)   LMP 11/24/1992   SpO2 97%   BMI 27.63 kg/m  GEN: NAD CV: RRR, no murmurs, rubs, or gallops PULM: CTAB, normal effort SKIN: No rash or cyanosis; warm and well-perfused EXTR: No lower extremity edema or calf tenderness PSYCH: Mood and affect euthymic, normal rate and volume of speech NEURO: Awake, alert, no focal deficits grossly, normal speech  ASSESSMENT/PLAN:  HYPERTENSION, BENIGN SYSTEMIC BP initially elevated to 178/60. Repeat was 146/60. No specific cause to her recent increase in BP readings at home. Will not change medication for now as her DBP is 60. Discussed ambulatory BP monitoring with Dr. Valentina Lucks. Patient is agreeable.   ANEMIA, OTHER, UNSPECIFIED POC hgb is stable. Per Dr. Macario Golds last note, she  wanted to check iron panel at the next visit. Will order Iron panel today. Continue iron supplement.   Diabetes mellitus type 2, controlled (HCC) A1c 6.6. Continue current management.   Smiley Houseman, MD PGY [place]

## 2018-03-04 ENCOUNTER — Encounter: Payer: Self-pay | Admitting: Internal Medicine

## 2018-03-04 ENCOUNTER — Other Ambulatory Visit: Payer: Self-pay

## 2018-03-04 ENCOUNTER — Ambulatory Visit: Payer: Medicare Other | Admitting: Internal Medicine

## 2018-03-04 VITALS — BP 146/60 | HR 70 | Temp 98.2°F | Wt 156.0 lb

## 2018-03-04 DIAGNOSIS — D649 Anemia, unspecified: Secondary | ICD-10-CM

## 2018-03-04 DIAGNOSIS — E1159 Type 2 diabetes mellitus with other circulatory complications: Secondary | ICD-10-CM | POA: Diagnosis not present

## 2018-03-04 DIAGNOSIS — I10 Essential (primary) hypertension: Secondary | ICD-10-CM | POA: Diagnosis not present

## 2018-03-04 LAB — POCT HEMOGLOBIN: Hemoglobin: 9.2 g/dL — AB (ref 12.2–16.2)

## 2018-03-04 LAB — POCT GLYCOSYLATED HEMOGLOBIN (HGB A1C): HEMOGLOBIN A1C: 6.6

## 2018-03-04 NOTE — Assessment & Plan Note (Signed)
POC hgb is stable. Per Dr. Macario Golds last note, she wanted to check iron panel at the next visit. Will order Iron panel today. Continue iron supplement.

## 2018-03-04 NOTE — Patient Instructions (Addendum)
Please make an appointment with Dr. Valentina Lucks for ambulatory blood pressure monitoring.   We will get some blood work to check your iron.

## 2018-03-04 NOTE — Assessment & Plan Note (Signed)
BP initially elevated to 178/60. Repeat was 146/60. No specific cause to her recent increase in BP readings at home. Will not change medication for now as her DBP is 60. Discussed ambulatory BP monitoring with Dr. Valentina Lucks. Patient is agreeable.

## 2018-03-04 NOTE — Assessment & Plan Note (Signed)
A1c 6.6. Continue current management.

## 2018-03-05 ENCOUNTER — Encounter: Payer: Self-pay | Admitting: Pharmacist

## 2018-03-05 ENCOUNTER — Encounter: Payer: Self-pay | Admitting: Internal Medicine

## 2018-03-05 ENCOUNTER — Ambulatory Visit (INDEPENDENT_AMBULATORY_CARE_PROVIDER_SITE_OTHER): Payer: Medicare Other | Admitting: Pharmacist

## 2018-03-05 ENCOUNTER — Other Ambulatory Visit: Payer: Self-pay | Admitting: Family Medicine

## 2018-03-05 DIAGNOSIS — I10 Essential (primary) hypertension: Secondary | ICD-10-CM

## 2018-03-05 LAB — IRON AND TIBC
Iron Saturation: 29 % (ref 15–55)
Iron: 82 ug/dL (ref 27–139)
Total Iron Binding Capacity: 283 ug/dL (ref 250–450)
UIBC: 201 ug/dL (ref 118–369)

## 2018-03-05 LAB — FERRITIN: Ferritin: 17 ng/mL (ref 15–150)

## 2018-03-05 NOTE — Progress Notes (Signed)
   S:    Patient arrives in good spirits and ambulating without assistance.    Presents to the clinic for ambulatory blood pressure evaluation.  Patient was referred on 03/04/2018.  Patient was last seen by Primary Care Provider on 03/04/2018.  Diagnosed with Hypertension in the year of 2008 per Problem List.    Medication compliance is reported to be "good I only miss pills 5 times/month".  Discussed procedure for wearing the monitor and gave patient written instructions. Monitor was placed on non-dominant arm with instructions to return in the morning.   Current BP Medications include:  amlodipine 10 mg daily, carvedilol 25 mg twice daily, isosorbide mononitrate 60 mg daily, lisinopril-HCTZ 20-12.5 mg 2 tabs daily.   Antihypertensives tried in the past include: clonidine 0.1 mg tablet twice daily (unclear if patient was adherent to this med; appears to have been d/c'd for controlled BP at one point to reduce pill burden).   Patient returned to the clinic and reported no issues with device.  Took off device early this AM and did not put it back on due to her being irritated with the device.   O:   Last 3 Office BP readings: BP Readings from Last 3 Encounters:  03/05/18 (!) 144/64  03/04/18 (!) 146/60  01/28/18 136/68    Today's Office BP reading: 144/64 mmHg (manual reading)  ABPM Study Data: Arm Placement left arm   Overall Mean 24hr BP:   142/63 mmHg HR: 70   Daytime Mean BP:  147/65 mmHg HR: 70   Nighttime Mean BP:  120/54 mmHg HR: 68   Dipping Pattern: Yes.    Sys:   18.5%   Dia: 17%   [normal dipping ~10-20%]   Non-hypertensive ABPM thresholds: daytime BP <135/85 mmHg, sleeptime BP <120/70 mmHg NICE Hypertension Guidelines ([place]) using ABPM: Stage I: >135/85 mmHg, Stage 2: >150/95 mmHg)   BMET    Component Value Date/Time   NA 142 08/29/2017 0808   K 3.8 08/29/2017 0808   CL 104 08/29/2017 0808   CO2 31 08/29/2017 0808   GLUCOSE 93 08/29/2017 0808   BUN 13  08/29/2017 0808   CREATININE 0.90 08/29/2017 0808   CREATININE 0.84 08/06/2016 1135   CALCIUM 9.7 08/29/2017 0808   GFRNONAA >60 08/29/2017 0808   GFRNONAA 69 08/06/2016 1135   GFRAA >60 08/29/2017 0808   GFRAA 80 08/06/2016 1135    A/P: History of hypertension since at least 2008 found to have isolated systolic hypertension given 24-hour ambulatory blood pressure demonstrates uncontrolled Stage 1 hypertension with an average blood pressure of 142/63 mmHg, and a nocturnal dipping pattern that is normal.  No changes to medications recommended at of this time due to low diastolic BP values and increased risk of hypotension. Patient counseled on non-pharmacological ways to lower BP including stress reduction.     Results reviewed and written information provided.  Total time in face-to-face counseling 15 minutes.   F/U Clinic Visit with Dr. Gwendlyn Deutscher.  Patient seen with Hildred Alamin, PharmD Candidate and Jalene Mullet, PharmD, PGY1 Resident and Deirdre Pippins, PGY2 Pharmacy Resident, PharmD, BCPS.

## 2018-03-05 NOTE — Patient Instructions (Addendum)
Wearing the Blood Pressure Monitor  The cuff will inflate every 20 minutes during the day and every 30 minutes while you sleep.  Your blood pressure readings will NOT display after cuff inflation  Fill out the blood pressure-activity diary during the day, especially during activities that may affect your reading -- such as exercise, stress, walking, taking your blood pressure meditions  Important things to know:  Avoid taking the monitor off for the next 24 hours, unless it causes you discomfort or pain.  Do NOT get the monitor wet and do NOT dry to clean the monitor with any cleaning products.  Do NOT drop the monitor.  Do NOT put the monitor on anyone else's arm.  When the cuff inflates, avoid excess movement. Let the cuffed arm hang loosely, slightly away from the body. Avoid flexing the muscles or moving the hand/fingers.  When you go to sleep, make sure that the hose is not kinked.  Remember to fill out the blood pressure activity diary.  If you experience severe pain or unusual pain (not associated with getting your blood pressure checked), remove the monitor.  Troubleshooting:  Code  Troubleshooting   1  Check cuff position, tighten cuff   2, 3  Remain still during reading   4, 87  Check air hose connections and make sure cuff is tight   85, 89  Check hose connections and make tubing is not crimped   86  Push START/STOP to restart reading   88, 91  Retry by pushing START/STOP   90  Replace batteries. If problem persists, remove monitor and bring back to   clinic at follow up   97, 98, 99  Service required - Remove monitor and bring back to clinic at follow up    Blood Pressure Activity Diary  Time Lying down/ Sleeping Walking/ Exercise Stressed/ Angry Headache/ Pain Dizzy  9 AM       10 AM       11 AM       12 PM       1 PM       2 PM       Time Lying down/ Sleeping Walking/ Exercise Stressed/ Angry Headache/ Pain Dizzy  3 PM       4 PM        5 PM       6 PM        7 PM       8 PM       Time Lying down/ Sleeping Walking/ Exercise Stressed/ Angry Headache/ Pain Dizzy  9 PM       10 PM       11 PM       12 AM       1 AM       2 AM       3 AM       Time Lying down/ Sleeping Walking/ Exercise Stressed/ Angry Headache/ Pain Dizzy  4 AM       5 AM       6 AM       7 AM       8 AM       9 AM       10 AM        Time you woke up: _________                  Time you went to sleep:__________  Come back tomorrow at ___________ to have the monitor removed  Call the [facility] if you have any questions before then [phone])

## 2018-03-06 NOTE — Assessment & Plan Note (Signed)
History of hypertension since at least 2008 found to have isolated systolic hypertension given 24-hour ambulatory blood pressure demonstrates uncontrolled Stage 1 hypertension with an average blood pressure of 142/63 mmHg, and a nocturnal dipping pattern that is normal.  No changes to medications recommended at of this time due to low diastolic BP values and increased risk of hypotension. Patient counseled on non-pharmacological ways to lower BP including stress reduction.

## 2018-03-06 NOTE — Progress Notes (Signed)
Patient ID: [name], female   DOB: Oct 04, 1941, 77 y.o.   MRN: [id number] Reviewed: agree with Dr. Graylin Shiver documentation and management.

## 2018-03-16 ENCOUNTER — Encounter: Payer: Self-pay | Admitting: Family Medicine

## 2018-04-03 ENCOUNTER — Other Ambulatory Visit: Payer: Self-pay | Admitting: Family Medicine

## 2018-04-25 ENCOUNTER — Other Ambulatory Visit: Payer: Self-pay | Admitting: Family Medicine

## 2018-05-15 ENCOUNTER — Other Ambulatory Visit: Payer: Self-pay | Admitting: Family Medicine

## 2018-06-15 ENCOUNTER — Other Ambulatory Visit: Payer: Self-pay | Admitting: Family Medicine

## 2018-06-26 ENCOUNTER — Encounter: Payer: Self-pay | Admitting: Family Medicine

## 2018-06-26 ENCOUNTER — Ambulatory Visit (INDEPENDENT_AMBULATORY_CARE_PROVIDER_SITE_OTHER): Payer: Medicare Other | Admitting: Family Medicine

## 2018-06-26 ENCOUNTER — Other Ambulatory Visit: Payer: Self-pay

## 2018-06-26 VITALS — BP 140/62 | HR 71 | Temp 98.7°F | Wt 161.0 lb

## 2018-06-26 DIAGNOSIS — M545 Low back pain, unspecified: Secondary | ICD-10-CM

## 2018-06-26 DIAGNOSIS — E118 Type 2 diabetes mellitus with unspecified complications: Secondary | ICD-10-CM

## 2018-06-26 DIAGNOSIS — G8929 Other chronic pain: Secondary | ICD-10-CM

## 2018-06-26 DIAGNOSIS — E1159 Type 2 diabetes mellitus with other circulatory complications: Secondary | ICD-10-CM | POA: Diagnosis not present

## 2018-06-26 DIAGNOSIS — Z794 Long term (current) use of insulin: Secondary | ICD-10-CM

## 2018-06-26 DIAGNOSIS — I10 Essential (primary) hypertension: Secondary | ICD-10-CM

## 2018-06-26 LAB — POCT GLYCOSYLATED HEMOGLOBIN (HGB A1C): HBA1C, POC (CONTROLLED DIABETIC RANGE): 7 % (ref 0.0–7.0)

## 2018-06-26 MED ORDER — BACLOFEN 5 MG PO TABS: 5.0000 mg | ORAL_TABLET | Freq: Three times a day (TID) | ORAL | 0 refills | Status: DC | PRN

## 2018-06-26 MED ORDER — IBUPROFEN 400 MG PO TABS: 400.0000 mg | ORAL_TABLET | Freq: Three times a day (TID) | ORAL | 0 refills | Status: DC | PRN

## 2018-06-26 NOTE — Assessment & Plan Note (Signed)
Systolic BP slightly high. Watch on current regimen.

## 2018-06-26 NOTE — Assessment & Plan Note (Signed)
What she called hip pain seems more like left sided lumbar spine pain. Hip exam benign. Mild tenderness or left flank area/ left lumbar region. ?? Muscle pain vs lumbar spine DJD. Trial of NSAID and muscle relaxant. She will call me if no improvement for xray. Return precaution discussed.

## 2018-06-26 NOTE — Progress Notes (Signed)
baclofen Subjective:     Patient ID: [name], female   DOB: 1941-05-13, 77 y.o.   MRN: [id number]  Hip Pain   Incident onset: Started 6 months ago, but a flare for 1 week. Incident location: No trauma, she fell 1 week ago on her right side, it was not a hard fall, she lost her balance while pulling on something. Injury mechanism: She has been moving cloths and hanging them in the last week and that was how she fell on her right side, although her pain is on the left. The pain is present in the left hip. The quality of the pain is described as aching. The pain is at a severity of 8/10 (10/10 in severity, but she took Tylenol already and now it is about 8/10 in severity). The pain is moderate. The pain has been constant since onset. Associated symptoms include an inability to bear weight and a loss of motion. Pertinent negatives include no loss of sensation, numbness or tingling. The symptoms are aggravated by movement and weight bearing. She has tried acetaminophen for the symptoms. The treatment provided mild relief.  DM2/HTN: COmpliant with meds. Currently on Lantus 25 units daily and Metformin XR 500 mg 2 tabs BID. Home CBG running fine. Denies any BP concern.  Current Outpatient Medications on File Prior to Visit  Medication Sig Dispense Refill  . amLODipine (NORVASC) 10 MG tablet TAKE ONE TABLET BY MOUTH DAILY (Patient taking differently: Take 10 mg by mouth once a day) 90 tablet 1  . aspirin 81 MG chewable tablet Chew 81 mg by mouth daily.      [name] atorvastatin (LIPITOR) 20 MG tablet TAKE ONE TABLET BY MOUTH DAILY AT 6PM 90 tablet 1  . BD VEO INSULIN SYRINGE U/F 31G X 15/64" 1 ML MISC USE ONE SYRINGE ONCE DAILY AS DIRECTED 100 each 11  . Blood Glucose Monitoring Suppl (ONE TOUCH ULTRA MINI) w/Device KIT Check blood sugar TID  before meals. Diabetes Mellitus Type 2. E11.9 1 each 0  . carvedilol (COREG) 25 MG tablet TAKE ONE TABLET BY MOUTH TWICE DAILY WITH MEALS 180 tablet 0  . ferrous  sulfate 325 (65 FE) MG tablet Take 325 mg by mouth daily with breakfast.    . fluticasone (FLONASE) 50 MCG/ACT nasal spray Place 2 sprays into the nose daily as needed. For allergies 16 g 6  . Garlic 4580 MG CAPS Take 1,000 mg by mouth daily.    [name] glucose blood (ONE TOUCH ULTRA TEST) test strip USE TO TEST BLOOD SUGAR 3 TIMES DAILY, BEFORE MEALS 100 each 4  . isosorbide mononitrate (IMDUR) 60 MG 24 hr tablet TAKE ONE TABLET BY MOUTH DAILY 90 tablet 2  . LANTUS 100 UNIT/ML injection INJECT 25 UNITS UNDER THE SKIN EVERY MORNING - HOLD IF GLUCOSE LEVEL IS LESS THAN 70 10 mL 3  . lisinopril-hydrochlorothiazide (PRINZIDE,ZESTORETIC) 20-12.5 MG tablet TAKE TWO TABLETS BY MOUTH DAILY 180 tablet 0  . loratadine (CLARITIN) 10 MG tablet Take 10 mg by mouth daily as needed for allergies.     . metFORMIN (GLUCOPHAGE-XR) 500 MG 24 hr tablet TAKE TWO TABLETS BY MOUTH TWICE A DAY 360 tablet 2  . Multiple Vitamin (MULTIVITAMIN WITH MINERALS) TABS tablet Take 1 tablet by mouth daily. Reported on 04/02/2016    . nitroGLYCERIN (NITROSTAT) 0.4 MG SL tablet Place 1 tablet (0.4 mg total) under the tongue every 5 (five) minutes as needed for chest pain. (Patient not taking: Reported on 03/05/2018) 25 tablet 3  .  ONETOUCH DELICA LANCETS 51Z MISC Check blood sugar TID  before meals. Diabetes Mellitus Type 2. E11.9 100 each 4   No current facility-administered medications on file prior to visit.    Past Medical History:  Diagnosis Date  . Anemia   . Aortic arch atherosclerosis ([place]) 08/29/2017  . Atypical chest pain 11/25/2011  . Cor pulmonale ([place])   . Coronary artery disease    a. Remote PTCA of the PDA;  b. 02/2009 PCI/CBA to the D1;  c. 11/2011 Cath: LM nl, LAD 20p, 89m D1 patent, RI 20p, LCX nl, RCA 30d, EF 55-65%.  . CTS (carpal tunnel syndrome)   . Diabetes mellitus, type II ([place]   . ETD (eustachian tube dysfunction)   . Gastritis   . GERD (gastroesophageal reflux disease)   . 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 History of thyroid cancer   . Hypercholesteremia   . Hypertension, benign   . Hypothyroid   . Myocardial infarction ([place]   . Nodule of right lung 08/29/2017   3 mm stable nodule right upper lobe on CTA Chest 08/28/17  . Postmenopausal   . 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 Subscapular pain, left 08/29/2017      Review of Systems  Respiratory: Negative.   Cardiovascular: Negative.   Gastrointestinal: Negative.   Musculoskeletal: Positive for arthralgias.       Left lower back and flank pain  Neurological: Negative for tingling and numbness.  All other systems reviewed and are negative.      Objective:   Physical Exam  Constitutional: She appears well-developed. No distress.  Cardiovascular: Normal rate, regular rhythm and normal heart sounds.  No murmur heard. Pulmonary/Chest: Effort normal and breath sounds normal. No respiratory distress.  Abdominal: Soft. Bowel sounds are normal. She exhibits no distension. There is no tenderness.  Musculoskeletal:       Right hip: Normal.       Left hip: Normal.       Lumbar back: She exhibits tenderness. She exhibits normal range of motion.  Nursing note and vitals reviewed.      Assessment:     Low back pain/left flank area DM2 HTN    Plan:     Check problem list.

## 2018-06-26 NOTE — Patient Instructions (Signed)
A1C went up a bit. Please continue same regimen. Watch your diet. See me in 3 months. I will send pain meds and muscle relaxant to your pharmacy. Call if pain persists.

## 2018-06-26 NOTE — Assessment & Plan Note (Signed)
A1C slightly worsened. She endorsed poor diet in the last few weeks,. Diet control and continue current regimen. F/U in 3 months.

## 2018-07-24 ENCOUNTER — Other Ambulatory Visit: Payer: Self-pay | Admitting: Family Medicine

## 2018-09-01 ENCOUNTER — Other Ambulatory Visit: Payer: Self-pay | Admitting: Family Medicine

## 2018-09-01 NOTE — Telephone Encounter (Signed)
Will forward to MD to advise.  Patient will likely need an appointment.  Jazmin Hartsell,CMA

## 2018-09-01 NOTE — Telephone Encounter (Signed)
LVM for patient to call the office back. When she does, please assist in scheduling a same day apt for her hip pain. Thanks!

## 2018-09-01 NOTE — Telephone Encounter (Signed)
Please have her come in to be seen

## 2018-09-01 NOTE — Telephone Encounter (Signed)
Pt called and would like to know if Dr. Gwendlyn Deutscher would be able to send something to her pharmacy for her hip pain. She said it has started bothering her again.

## 2018-09-06 ENCOUNTER — Emergency Department (HOSPITAL_COMMUNITY): Payer: Medicare Other

## 2018-09-06 ENCOUNTER — Other Ambulatory Visit: Payer: Self-pay

## 2018-09-06 ENCOUNTER — Emergency Department (HOSPITAL_COMMUNITY)
Admission: EM | Admit: 2018-09-06 | Discharge: 2018-09-07 | Disposition: A | Discharge status: Home / Self Care | Payer: Medicare Other | Attending: Emergency Medicine | Admitting: Emergency Medicine

## 2018-09-06 DIAGNOSIS — Z794 Long term (current) use of insulin: Secondary | ICD-10-CM | POA: Insufficient documentation

## 2018-09-06 DIAGNOSIS — M545 Low back pain, unspecified: Secondary | ICD-10-CM

## 2018-09-06 DIAGNOSIS — Z7982 Long term (current) use of aspirin: Secondary | ICD-10-CM | POA: Diagnosis not present

## 2018-09-06 DIAGNOSIS — Z87891 Personal history of nicotine dependence: Secondary | ICD-10-CM | POA: Diagnosis not present

## 2018-09-06 DIAGNOSIS — E039 Hypothyroidism, unspecified: Secondary | ICD-10-CM | POA: Insufficient documentation

## 2018-09-06 DIAGNOSIS — Z79899 Other long term (current) drug therapy: Secondary | ICD-10-CM | POA: Insufficient documentation

## 2018-09-06 DIAGNOSIS — I2510 Atherosclerotic heart disease of native coronary artery without angina pectoris: Secondary | ICD-10-CM | POA: Diagnosis not present

## 2018-09-06 DIAGNOSIS — N3000 Acute cystitis without hematuria: Secondary | ICD-10-CM | POA: Insufficient documentation

## 2018-09-06 DIAGNOSIS — I252 Old myocardial infarction: Secondary | ICD-10-CM | POA: Diagnosis not present

## 2018-09-06 DIAGNOSIS — E119 Type 2 diabetes mellitus without complications: Secondary | ICD-10-CM | POA: Diagnosis not present

## 2018-09-06 DIAGNOSIS — I10 Essential (primary) hypertension: Secondary | ICD-10-CM | POA: Diagnosis not present

## 2018-09-06 DIAGNOSIS — E7800 Pure hypercholesterolemia, unspecified: Secondary | ICD-10-CM | POA: Insufficient documentation

## 2018-09-06 LAB — CBC WITH DIFFERENTIAL/PLATELET
Abs Immature Granulocytes: 0 10*3/uL (ref 0.0–0.1)
BASOS PCT: 1 %
Basophils Absolute: 0.1 10*3/uL (ref 0.0–0.1)
EOS ABS: 0.1 10*3/uL (ref 0.0–0.7)
Eosinophils Relative: 2 %
HEMATOCRIT: 31.8 % — AB (ref 36.0–46.0)
HEMOGLOBIN: 9.6 g/dL — AB (ref 12.0–15.0)
Immature Granulocytes: 0 %
LYMPHS ABS: 2.3 10*3/uL (ref 0.7–4.0)
Lymphocytes Relative: 37 %
MCH: 27.6 pg (ref 26.0–34.0)
MCHC: 30.2 g/dL (ref 30.0–36.0)
MCV: 91.4 fL (ref 78.0–100.0)
MONO ABS: 0.8 10*3/uL (ref 0.1–1.0)
MONOS PCT: 14 %
Neutro Abs: 2.7 10*3/uL (ref 1.7–7.7)
Neutrophils Relative %: 46 %
Platelets: 136 10*3/uL — ABNORMAL LOW (ref 150–400)
RBC: 3.48 MIL/uL — ABNORMAL LOW (ref 3.87–5.11)
RDW: 15.1 % (ref 11.5–15.5)
WBC: 6 10*3/uL (ref 4.0–10.5)

## 2018-09-06 LAB — COMPREHENSIVE METABOLIC PANEL
ALBUMIN: 3.6 g/dL (ref 3.5–5.0)
ALK PHOS: 79 U/L (ref 38–126)
ALT: 22 U/L (ref 0–44)
AST: 19 U/L (ref 15–41)
Anion gap: 9 (ref 5–15)
BILIRUBIN TOTAL: 0.5 mg/dL (ref 0.3–1.2)
BUN: 16 mg/dL (ref 8–23)
CALCIUM: 9.6 mg/dL (ref 8.9–10.3)
CO2: 27 mmol/L (ref 22–32)
CREATININE: 1.01 mg/dL — AB (ref 0.44–1.00)
Chloride: 104 mmol/L (ref 98–111)
GFR calc Af Amer: >60 mL/min (ref >60)
GFR calc non Af Amer: 53 mL/min — ABNORMAL LOW (ref >60)
Glucose, Bld: 197 mg/dL — ABNORMAL HIGH (ref 70–99)
Potassium: 3.6 mmol/L (ref 3.5–5.1)
Sodium: 140 mmol/L (ref 135–145)
TOTAL PROTEIN: 6.6 g/dL (ref 6.5–8.1)

## 2018-09-06 LAB — URINALYSIS, ROUTINE W REFLEX MICROSCOPIC
BILIRUBIN URINE: NEGATIVE
Glucose, UA: NEGATIVE mg/dL
HGB URINE DIPSTICK: NEGATIVE
Ketones, ur: NEGATIVE mg/dL
NITRITE: POSITIVE — AB
PROTEIN: NEGATIVE mg/dL
Specific Gravity, Urine: 1.015 (ref 1.005–1.030)
pH: 5 (ref 5.0–8.0)

## 2018-09-06 LAB — LIPASE, BLOOD: Lipase: 37 U/L (ref 11–51)

## 2018-09-06 MED ORDER — SODIUM CHLORIDE 0.9 % IV SOLN
1.0000 g | Freq: Once | INTRAVENOUS | Status: AC
  Administered 2018-09-06: 1 g via INTRAVENOUS
  Filled 2018-09-06: qty 10

## 2018-09-06 MED ORDER — METHOCARBAMOL 500 MG PO TABS
500.0000 mg | ORAL_TABLET | Freq: Once | ORAL | Status: AC
  Administered 2018-09-06: 500 mg via ORAL
  Filled 2018-09-06: qty 1

## 2018-09-06 MED ORDER — KETOROLAC TROMETHAMINE 30 MG/ML IJ SOLN
30.0000 mg | Freq: Once | INTRAMUSCULAR | Status: AC
  Administered 2018-09-06: 30 mg via INTRAVENOUS
  Filled 2018-09-06: qty 1

## 2018-09-06 MED ORDER — METHOCARBAMOL 500 MG PO TABS: 500.0000 mg | ORAL_TABLET | Freq: Two times a day (BID) | ORAL | 0 refills | Status: DC

## 2018-09-06 MED ORDER — LEVOFLOXACIN 750 MG PO TABS: 750.0000 mg | ORAL_TABLET | Freq: Every day | ORAL | 0 refills | Status: AC

## 2018-09-06 NOTE — ED Notes (Signed)
Pt returned from CT, medicated per protocol. Family at bedside

## 2018-09-06 NOTE — ED Notes (Signed)
Pt to CT via stretcher

## 2018-09-06 NOTE — Discharge Instructions (Addendum)

## 2018-09-06 NOTE — ED Triage Notes (Signed)
Patient c/o left back pain that started one week ago that radiated to left hip, and has not radiated to right side of back.

## 2018-09-06 NOTE — ED Provider Notes (Signed)
Emergency Department Provider Note   I have reviewed the triage vital signs and the nursing notes.   HISTORY  Chief Complaint Back Pain   HPI [name] is a 77 y.o. female with PMH of CAD, HTN, and HLD presents to the ED with lower back pain. Symptoms have been ongoing for several days. Pain does not radiate down the legs. No numbness, tingling, or weakness. No fever or chills. Patient states the pain does radiate across the lower back. No abdominal pain. Pain is worse with movement.    Past Medical History:  Diagnosis Date  . Anemia   . Aortic arch atherosclerosis ([place]) 08/29/2017  . Atypical chest pain 11/25/2011  . Cor pulmonale ([place])   . Coronary artery disease    a. Remote PTCA of the PDA;  b. 02/2009 PCI/CBA to the D1;  c. 11/2011 Cath: LM nl, LAD 20p, 11m, D1 patent, RI 20p, LCX nl, RCA 30d, EF 55-65%.  . CTS (carpal tunnel syndrome)   . Diabetes mellitus, type II ([place])   . ETD (eustachian tube dysfunction)   . Gastritis   . GERD (gastroesophageal reflux disease)   . 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 History of thyroid cancer   . Hypercholesteremia   . Hypertension, benign   . Hypothyroid   . Myocardial infarction ([place])   . Nodule of right lung 08/29/2017   3 mm stable nodule right upper lobe on CTA Chest 08/28/17  . Postmenopausal   . 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 Subscapular pain, left 08/29/2017    Patient Active Problem List   Diagnosis Date Noted  . Back pain 06/26/2018  . Subscapular pain, left 08/29/2017  . 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 Nodule of right lung 08/29/2017  . Aortic arch atherosclerosis ([place]) 08/29/2017  . Liver cyst   . Chest pain 08/28/2017  . Acute left-sided low back pain without sciatica 05/13/2017  . Thickened nail 03/01/2016  . IBS (irritable bowel syndrome) 12/27/2014  . Left thyroid nodule 07/27/2014    . Thyroid enlargement 04/12/2013  . History of thyroid cancer 04/12/2013  . HYPOTHYROIDISM, UNSPECIFIED 02/19/2007  . Diabetes mellitus type 2, controlled ([place]) 02/19/2007  . HYPERCHOLESTEROLEMIA 02/19/2007  . ANEMIA, OTHER, UNSPECIFIED 02/19/2007  . CARPAL TUNNEL SYNDROME 02/19/2007  . HYPERTENSION, BENIGN SYSTEMIC 02/19/2007  . Coronary atherosclerosis 02/19/2007  . COR PULMONALE 02/19/2007    Past Surgical History:  Procedure Laterality Date  . ANGIOPLASTY     PDA 90-40 %  . BARTHOLIN GLAND CYST EXCISION  09-13-2004   Keratosis- no CA  . BREAST BIOPSY     LEFT SPOT REMOVED  . BREAST EXCISIONAL BIOPSY Left   . CARDIAC CATHETERIZATION  03/15/2009   stenosis of diagonal branch of LAD- PTCA performed  . CHOLECYSTECTOMY  08-23-2002  . CORONARY ANGIOPLASTY    . ENDOMETRIAL BIOPSY  02-20-1998  . LEFT HEART CATHETERIZATION WITH CORONARY ANGIOGRAM N/A 11/26/2011   Procedure: LEFT HEART CATHETERIZATION WITH CORONARY ANGIOGRAM;  Surgeon: Peter M Martinique, MD;  Location: [facility] CATH LAB;  Service: Cardiovascular;  Laterality: N/A;  . PTCA  10/23/2000 and 03/15/09  . 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 TONSILLECTOMY    . TUBAL LIGATION      Allergies Codeine; Sulfamethoxazole-trimethoprim; and Tramadol  Family History  Problem Relation Age of Onset  . Diabetes Mother  Deceased- hypoglycemic coma   . Hypertension Mother   . Other Father        unaware of his health history - died when pt was 77 yr old.  [name] Heart attack Sister   . Diabetes Mellitus II Brother   . Heart attack Brother   . Heart attack Brother   . Diabetes Mellitus II Brother   . Epilepsy Brother   . Breast cancer Daughter     Social History Social History   Tobacco Use  . Smoking status: Former Smoker    Types: Cigarettes    Last attempt to quit: 12/23/1980    Years since quitting: 37.7  . Smokeless tobacco: Former Systems developer    Types: [place] date: 08/23/1994  Substance Use  Topics  . Alcohol use: No  . Drug use: No    Review of Systems  Constitutional: No fever/chills Eyes: No visual changes. ENT: No sore throat. Cardiovascular: Denies chest pain. Respiratory: Denies shortness of breath. Gastrointestinal: No abdominal pain.  No nausea, no vomiting.  No diarrhea.  No constipation. Genitourinary: Positive for dysuria. Musculoskeletal: Positive for back pain. Skin: Negative for rash. Neurological: Negative for headaches, focal weakness or numbness.  10-point ROS otherwise negative.  ____________________________________________   PHYSICAL EXAM:  VITAL SIGNS: ED Triage Vitals  Enc Vitals Group     BP 09/06/18 2041 (!) 164/58     Pulse Rate 09/06/18 2041 81     Resp 09/06/18 2041 17     Temp 09/06/18 2041 98.5 F (36.9 C)     Temp Source 09/06/18 2041 Oral     SpO2 09/06/18 2041 98 %     Weight 09/06/18 2044 160 lb (72.6 kg)     Height 09/06/18 2044 5' 2.5" (1.588 m)     Pain Score 09/06/18 2041 6   Constitutional: Alert and oriented. Well appearing and in no acute distress. Eyes: Conjunctivae are normal. Head: Atraumatic. Nose: No congestion/rhinnorhea. Mouth/Throat: Mucous membranes are moist.  Oropharynx non-erythematous. Neck: No stridor.   Cardiovascular: Normal rate, regular rhythm. Good peripheral circulation. Grossly normal heart sounds.   Respiratory: Normal respiratory effort.  No retractions. Lungs CTAB. Gastrointestinal: Soft and nontender. No distention.  Musculoskeletal: No lower extremity tenderness nor edema. No gross deformities of extremities. Mild paraspinal tenderness to palpation of the lumbar spine.  Neurologic:  Normal speech and language. No gross focal neurologic deficits are appreciated.  Skin:  Skin is warm, dry and intact. No rash noted.  ____________________________________________   LABS (all labs ordered are listed, but only abnormal results are displayed)  Labs Reviewed  URINALYSIS, ROUTINE W REFLEX  MICROSCOPIC - Abnormal; Notable for the following components:      Result Value   APPearance HAZY (*)    Nitrite POSITIVE (*)    Leukocytes, UA SMALL (*)    Bacteria, UA FEW (*)    All other components within normal limits  COMPREHENSIVE METABOLIC PANEL - Abnormal; Notable for the following components:   Glucose, Bld 197 (*)    Creatinine, Ser 1.01 (*)    GFR calc non Af Amer 53 (*)    All other components within normal limits  CBC WITH DIFFERENTIAL/PLATELET - Abnormal; Notable for the following components:   RBC 3.48 (*)    Hemoglobin 9.6 (*)    HCT 31.8 (*)    Platelets 136 (*)    All other components within normal limits  LIPASE, BLOOD   ____________________________________________  EKG   EKG Interpretation  Date/Time:  Sunday September 06 2018 20:39:10 EDT Ventricular Rate:  77 PR Interval:  150 QRS Duration: 84 QT Interval:  348 QTC Calculation: 393 R Axis:   32 Text Interpretation:  Normal sinus rhythm Anterior infarct , age undetermined ST & T wave abnormality, consider inferolateral ischemia Abnormal ECG No STEMI.  Confirmed by Nanda Quinton [phone]) on 09/07/2018 9:34:52 AM       ____________________________________________  RADIOLOGY  Ct Renal Stone Study  Result Date: 09/06/2018 CLINICAL DATA:  Bilateral flank pain EXAM: CT ABDOMEN AND PELVIS WITHOUT CONTRAST TECHNIQUE: Multidetector CT imaging of the abdomen and pelvis was performed following the standard protocol without IV contrast. COMPARISON:  Ultrasound 08/29/2017 FINDINGS: Lower chest: Lung bases are clear. Heart size within normal limits. Moderate hiatal hernia. Hepatobiliary: Cysts within the liver. Status post cholecystectomy without biliary dilatation Pancreas: Unremarkable. No pancreatic ductal dilatation or surrounding inflammatory changes. Spleen: Normal in size without focal abnormality. Adrenals/Urinary Tract: Adrenal glands are within normal limits. No hydronephrosis. No ureteral stone. Possible  cystocele. Stomach/Bowel: Stomach is within normal limits. Appendix appears normal. No evidence of bowel wall thickening, distention, or inflammatory changes. Sigmoid and descending colon diverticular disease without acute inflammatory process Vascular/Lymphatic: Nonaneurysmal aorta. Extensive aortic atherosclerosis. Prominent left inguinal lymph node measuring up to 11 mm short axis. Reproductive: Multiple calcified fibroids no adnexal mass. Other: Negative for free air or free fluid. Fat in the umbilical region. Musculoskeletal: No acute or significant osseous findings. IMPRESSION: 1. Negative for hydronephrosis or ureteral stone 2. Moderate hiatal hernia 3. 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 Fibroid uterus 5. Diverticular disease of the left colon without acute inflammatory process Electronically Signed   By: Donavan Foil M.D.   On: 09/06/2018 22:39    ____________________________________________   PROCEDURES  Procedure(s) performed:   Procedures  None ____________________________________________   INITIAL IMPRESSION / ASSESSMENT AND PLAN / ED COURSE  Pertinent labs & imaging results that were available during my care of the patient were reviewed by me and considered in my medical decision making (see chart for details).  Patient presents to the ED with lower back pain and mild dysuria. Suspect MSK etiology but some UTI findings on UA. No fever or concern for developing sepsis. CT negative for renal stone. No concern for hip injury/fracture. Patient feeling better after Robaxin. Will cover with Levaquin given back pain and have the patient f/u with PCP.   At this time, I do not feel there is any life-threatening condition present. I have reviewed and discussed all results (EKG, imaging, lab, urine as appropriate), exam findings with patient. I have reviewed nursing notes and appropriate previous records.  I feel the patient is safe to be discharged home  without further emergent workup. Discussed usual and customary return precautions. Patient and family (if present) verbalize understanding and are comfortable with this plan.  Patient will follow-up with their primary care provider. If they do not have a primary care provider, information for follow-up has been provided to them. All questions have been answered.  ____________________________________________  FINAL CLINICAL IMPRESSION(S) / ED DIAGNOSES  Final diagnoses:  Acute cystitis without hematuria  Acute bilateral low back pain without sciatica     MEDICATIONS GIVEN DURING THIS VISIT:  Medications  ketorolac (TORADOL) 30 MG/ML injection 30 mg (30 mg Intravenous Given 09/06/18 2233)  methocarbamol (ROBAXIN) tablet 500 mg (500 mg Oral Given 09/06/18 2234)  cefTRIAXone (ROCEPHIN) 1 g in sodium chloride 0.9 % 100 mL IVPB (0 g Intravenous Stopped  09/07/18 0012)     NEW OUTPATIENT MEDICATIONS STARTED DURING THIS VISIT:  Discharge Medication List as of 09/06/2018 11:43 PM    START taking these medications   Details  levofloxacin (LEVAQUIN) 750 MG tablet Take 1 tablet (750 mg total) by mouth daily for 5 days., Starting Sun 09/06/2018, Until Fri 09/11/2018, Print    methocarbamol (ROBAXIN) 500 MG tablet Take 1 tablet (500 mg total) by mouth 2 (two) times daily., Starting Sun 09/06/2018, Print        Note:  This document was prepared using Dragon voice recognition software and may include unintentional dictation errors.  Nanda Quinton, MD Emergency Medicine    Nyaira Hodgens, [facility], MD 09/07/18 [phone]

## 2018-09-10 ENCOUNTER — Telehealth: Payer: Self-pay | Admitting: Family Medicine

## 2018-09-10 ENCOUNTER — Other Ambulatory Visit: Payer: Self-pay | Admitting: *Deleted

## 2018-09-10 ENCOUNTER — Other Ambulatory Visit: Payer: Self-pay | Admitting: Family Medicine

## 2018-09-10 ENCOUNTER — Telehealth: Payer: Self-pay

## 2018-09-10 MED ORDER — LIDOCAINE 5 % EX PTCH: 1.0000 | MEDICATED_PATCH | CUTANEOUS | 0 refills | Status: DC

## 2018-09-10 NOTE — Telephone Encounter (Signed)
I attempted to call again without success. HIPAA compliant cal back message left. I will need to talk to her to discuss options prior to refill. Thanks.

## 2018-09-10 NOTE — Telephone Encounter (Signed)
Pt calling to let us know she can't afford Lidoderm patches. Pt would like something cheaper called in that will be covered by insurance. Please let pt know what we decide to do.  Ottis Stain, CMA

## 2018-09-10 NOTE — Telephone Encounter (Signed)
I called all listed numbers to discuss NSAID for pain and mild kidney impairment. However, I was unable to reach her.    I will go ahead and escribe Lidoderm. F/U with me soon for reassessment.

## 2018-09-10 NOTE — Telephone Encounter (Signed)
**  After Hours/ Emergency Line Call**  Received a call to report that [name] was having worsening muscle spasms since her visit to the ED on 9/15. Robaxin was not helping. She was scared to use the lidocaine patch. Reassured her that lidocaine patch should be relatively safe to use. Endorsing some nausea and vomiting yesterday that has resolved.  Denying fevers and chills.  She is able to eat and drink normally. Recommended that she come into the office tomorrow morning, and she can come around 9:30.  Red flags discussed.  Will forward to PCP.  Martinique Tully Mcinturff, DO PGY-2, [facility] 09/10/2018 6:08 PM

## 2018-09-10 NOTE — Telephone Encounter (Signed)
Pt informed. Gwyn Hieronymus Dawn, CMA  

## 2018-09-10 NOTE — Telephone Encounter (Signed)
Pt calls and states that the Robaxin is not helping with her back spasms.  She is using heating pads with no relief.  She wants to know if something else can be called in. Brandan Robicheaux, Salome Spotted, CMA

## 2018-09-10 NOTE — Telephone Encounter (Signed)
I escribed Lidoderm

## 2018-09-11 ENCOUNTER — Ambulatory Visit (INDEPENDENT_AMBULATORY_CARE_PROVIDER_SITE_OTHER): Payer: Medicare Other | Admitting: Family Medicine

## 2018-09-11 VITALS — BP 140/80 | Temp 98.5°F

## 2018-09-11 DIAGNOSIS — M62830 Muscle spasm of back: Secondary | ICD-10-CM

## 2018-09-11 MED ORDER — DICLOFENAC SODIUM 1 % TD GEL: 4.0000 g | Freq: Four times a day (QID) | TRANSDERMAL | 0 refills | Status: DC

## 2018-09-11 MED ORDER — CYCLOBENZAPRINE HCL 7.5 MG PO TABS: 7.5000 mg | ORAL_TABLET | Freq: Three times a day (TID) | ORAL | 0 refills | Status: DC | PRN

## 2018-09-11 MED ORDER — MELOXICAM 15 MG PO TABS: 15.0000 mg | ORAL_TABLET | Freq: Every day | ORAL | 0 refills | Status: DC

## 2018-09-11 NOTE — Patient Instructions (Addendum)
It was great meeting you today!  I am sorry you had symmetrical your back.  Typically like to tackle these muscle spasms in a few different ways.  With a relaxer, anti-inflammatory, and topical gel.  I have given you around a 2-week supply of an anti-inflammatory called Mobic.  This is taken 1 time per day with breakfast.  I also given urine out a 2-week supply of a new muscle relaxant called Flexeril, he could take this up to 3 times per day.  I sent in a prescription for a topical gel called Voltaren gel, which I think works a little better than lidocaine patches.  If this proves to be too expensive continue lidocaine patches.  If you do not feel any better by early next week, please come back and see [place].  If you are making slow steady progress I would like to continue your current medications, just ask for refills.

## 2018-09-12 ENCOUNTER — Encounter: Payer: Self-pay | Admitting: Family Medicine

## 2018-09-12 DIAGNOSIS — M62830 Muscle spasm of back: Principal | ICD-10-CM

## 2018-09-12 NOTE — Progress Notes (Signed)
   HPI 77 year old female who presents for bilateral lower back pain.  Patient states approximately 1 week ago she was getting out of a wooden church pew and she felt sharp pain.  She thought it was just over when she sat, but pain is gradually gotten worse since then.  She was seen on 09/06/2018 for back pain.  Notably she also had positive nitrites on a UA and was treated with Levaquin.  She was not really having any burning to urination symptoms.  She is also given methocarbamol 500 mg 2 times daily.  She states that these have not helped her at all.  She is also advised to get lidocaine patches.  She states these have not helped her all either.  She is not having any radiating down her legs.  She is having no incontinence, no lower extremity weakness.  All her mobility is limited by pain does not due to muscle weakness.  CC: Low back pain   ROS:   Review of Systems See HPI for ROS.   CC, SH/smoking status, and VS noted  Objective: BP 140/80   Temp 98.5 F (36.9 C) (Oral)   LMP 11/24/1992   SpO2 97%  Gen: NAD, alert, cooperative, and pleasant.  Elderly African-American female, resting comfortably in wheelchair. CV: RRR, no murmur Resp: CTAB, no wheezes, non-labored Abd: SNTND, BS present, no guarding or organomegaly Neuro: Alert and oriented, Speech clear, No gross deficits.  Negative straight leg raise bilaterally.  Sensation intact bilateral lower extremities.5/5 strength in all lower extremity muscle groups Tenderness bilateral lower back L4-L5 distribution.  Exacerbated with muscle palpation.  No midline tenderness.   Assessment and plan:  Muscle spasm of back Patient has lumbar back pain consistent with muscle spasm.  There may also be some aspect of lumbar ligament strain.  We will expect her to start getting better even without medication the next couple of days.  No alarm signs.  Will treat with Mobic 50 mg tablet daily, Flexeril 7.5 mg 3 times daily, and Voltaren gel.  If  patient not feeling better the next couple of days gave return precautions.   No orders of the defined types were placed in this encounter.   Meds ordered this encounter  Medications  . DISCONTD: meloxicam (MOBIC) 15 MG tablet    Sig: Take 1 tablet (15 mg total) by mouth daily.    Dispense:  30 tablet    Refill:  0  . DISCONTD: cyclobenzaprine (FEXMID) 7.5 MG tablet    Sig: Take 1 tablet (7.5 mg total) by mouth 3 (three) times daily as needed for muscle spasms.    Dispense:  30 tablet    Refill:  0  . diclofenac sodium (VOLTAREN) 1 % GEL    Sig: Apply 4 g topically 4 (four) times daily.    Dispense:  100 g    Refill:  0  . cyclobenzaprine (FEXMID) 7.5 MG tablet    Sig: Take 1 tablet (7.5 mg total) by mouth 3 (three) times daily as needed for muscle spasms.    Dispense:  30 tablet    Refill:  0  . meloxicam (MOBIC) 15 MG tablet    Sig: Take 1 tablet (15 mg total) by mouth daily.    Dispense:  15 tablet    Refill:  0     Guadalupe Dawn MD PGY-2 Family Medicine Resident  09/12/2018 12:12 PM

## 2018-09-12 NOTE — Assessment & Plan Note (Signed)
Patient has lumbar back pain consistent with muscle spasm.  There may also be some aspect of lumbar ligament strain.  We will expect her to start getting better even without medication the next couple of days.  No alarm signs.  Will treat with Mobic 50 mg tablet daily, Flexeril 7.5 mg 3 times daily, and Voltaren gel.  If patient not feeling better the next couple of days gave return precautions.

## 2018-09-14 NOTE — Telephone Encounter (Signed)
Error

## 2018-09-29 ENCOUNTER — Encounter: Payer: Self-pay | Admitting: Family Medicine

## 2018-09-29 ENCOUNTER — Ambulatory Visit: Payer: Medicare Other | Admitting: Family Medicine

## 2018-09-29 ENCOUNTER — Other Ambulatory Visit: Payer: Self-pay

## 2018-09-29 VITALS — BP 124/60 | HR 72 | Temp 98.1°F | Ht 63.0 in | Wt 164.0 lb

## 2018-09-29 DIAGNOSIS — E1159 Type 2 diabetes mellitus with other circulatory complications: Secondary | ICD-10-CM | POA: Diagnosis not present

## 2018-09-29 DIAGNOSIS — G8929 Other chronic pain: Secondary | ICD-10-CM

## 2018-09-29 DIAGNOSIS — I10 Essential (primary) hypertension: Secondary | ICD-10-CM | POA: Diagnosis not present

## 2018-09-29 DIAGNOSIS — Z23 Encounter for immunization: Secondary | ICD-10-CM

## 2018-09-29 DIAGNOSIS — M545 Low back pain, unspecified: Secondary | ICD-10-CM

## 2018-09-29 LAB — POCT GLYCOSYLATED HEMOGLOBIN (HGB A1C): HBA1C, POC (CONTROLLED DIABETIC RANGE): 7.1 % — AB (ref 0.0–7.0)

## 2018-09-29 NOTE — Patient Instructions (Signed)

## 2018-09-29 NOTE — Assessment & Plan Note (Signed)
BP looks good. No meds change.

## 2018-09-29 NOTE — Assessment & Plan Note (Addendum)
A1C increased by 0.1 point. Continue current Lantus at 25 units daily. Metformin 1000 mg BID Continue home CBG monitoring. Diet discussed in addition. F/U in 3-4 months.

## 2018-09-29 NOTE — Progress Notes (Signed)
Subjective:     Patient ID: [name], female   DOB: 12-24-1940, 77 y.o.   MRN: [id number]  HPI DM2/HTN: Here for f/u. She is compliant with her meds as instructed. How CBG highest was 135 and lowest was 94. No hypoglycemic symptoms. Back pain: C/O low back pain on going for 5 weeks, no injury.  She had been to the ED and clinic multiple times and had been on different meds without a major improvement. Vlotren gel is helping some. Since her symptoms started, she had fallen 4 times. No head injury or LOC.  Current Outpatient Medications on File Prior to Visit  Medication Sig Dispense Refill  . amLODipine (NORVASC) 10 MG tablet TAKE ONE TABLET BY MOUTH DAILY (Patient taking differently: Take 10 mg by mouth once a day) 90 tablet 1  . aspirin 81 MG chewable tablet Chew 81 mg by mouth daily.      [name] atorvastatin (LIPITOR) 20 MG tablet TAKE ONE TABLET BY MOUTH DAILY AT 6PM 90 tablet 3  . carvedilol (COREG) 25 MG tablet TAKE ONE TABLET BY MOUTH TWICE A DAY WITH MEALS 180 tablet 0  . BD VEO INSULIN SYRINGE U/F 31G X 15/64" 1 ML MISC USE ONE SYRINGE ONCE DAILY AS DIRECTED 100 each 11  . Blood Glucose Monitoring Suppl (ONE TOUCH ULTRA MINI) w/Device KIT Check blood sugar TID  before meals. Diabetes Mellitus Type 2. E11.9 1 each 0  . diclofenac sodium (VOLTAREN) 1 % GEL Apply 4 g topically 4 (four) times daily. 100 g 0  . ferrous sulfate 325 (65 FE) MG tablet Take 325 mg by mouth daily with breakfast.    . fluticasone (FLONASE) 50 MCG/ACT nasal spray Place 2 sprays into the nose daily as needed. For allergies 16 g 6  . Garlic 2353 MG CAPS Take 1,000 mg by mouth daily.    [name] glucose blood (ONE TOUCH ULTRA TEST) test strip USE TO TEST BLOOD SUGAR 3 TIMES DAILY, BEFORE MEALS 100 each 4  . ibuprofen (ADVIL,MOTRIN) 400 MG tablet Take 1 tablet (400 mg total) by mouth every 8 (eight) hours as needed. 60 tablet 0  . isosorbide mononitrate (IMDUR) 60 MG 24 hr tablet TAKE ONE TABLET BY MOUTH DAILY 90 tablet  2  . LANTUS 100 UNIT/ML injection INJECT 25 UNITS UNDER THE SKIN EVERY MORNING - HOLD IF GLUCOSE LEVEL IS LESS THAN 70 10 mL 3  . lisinopril-hydrochlorothiazide (PRINZIDE,ZESTORETIC) 20-12.5 MG tablet TAKE TWO TABLETS BY MOUTH DAILY 180 tablet 1  . loratadine (CLARITIN) 10 MG tablet Take 10 mg by mouth daily as needed for allergies.     . meloxicam (MOBIC) 15 MG tablet Take 1 tablet (15 mg total) by mouth daily. 15 tablet 0  . metFORMIN (GLUCOPHAGE-XR) 500 MG 24 hr tablet TAKE TWO TABLETS BY MOUTH TWICE A DAY 360 tablet 2  . Multiple Vitamin (MULTIVITAMIN WITH MINERALS) TABS tablet Take 1 tablet by mouth daily. Reported on 04/02/2016    . nitroGLYCERIN (NITROSTAT) 0.4 MG SL tablet Place 1 tablet (0.4 mg total) under the tongue every 5 (five) minutes as needed for chest pain. (Patient not taking: Reported on 03/05/2018) 25 tablet 3  . ONETOUCH DELICA LANCETS 61W MISC Check blood sugar TID  before meals. Diabetes Mellitus Type 2. E11.9 100 each 4   No current facility-administered medications on file prior to visit.    Past Medical History:  Diagnosis Date  . Anemia   . Aortic arch atherosclerosis ([place]) 08/29/2017  . Atypical chest  pain 11/25/2011  . Cor pulmonale ([place])   . Coronary artery disease    a. Remote PTCA of the PDA;  b. 02/2009 PCI/CBA to the D1;  c. 11/2011 Cath: LM nl, LAD 20p, 26m D1 patent, RI 20p, LCX nl, RCA 30d, EF 55-65%.  . CTS (carpal tunnel syndrome)   . Diabetes mellitus, type II ([place]   . ETD (eustachian tube dysfunction)   . Gastritis   . GERD (gastroesophageal reflux disease)   . 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 History of thyroid cancer   . Hypercholesteremia   . Hypertension, benign   . Hypothyroid   . Myocardial infarction ([place]   . Nodule of right lung 08/29/2017   3 mm stable nodule right upper lobe on CTA Chest 08/28/17  . Postmenopausal   . 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  Subscapular pain, left 08/29/2017   Vitals:   09/29/18 1016  BP: 124/60  Pulse: 72  Temp: 98.1 F (36.7 C)  TempSrc: Oral  SpO2: 99%  Weight: 164 lb (74.4 kg)  Height: '5\' 3"'  (1.6 m)     Review of Systems  Respiratory: Negative.   Cardiovascular: Negative.   Gastrointestinal: Negative.   Musculoskeletal: Positive for back pain.  Neurological: Negative.   All other systems reviewed and are negative.      Objective:   Physical Exam  Constitutional: She appears well-developed. No distress.  Cardiovascular: Normal rate, regular rhythm and normal heart sounds.  No murmur heard. Pulmonary/Chest: Effort normal and breath sounds normal. No stridor. No respiratory distress. She has no wheezes.  Abdominal: Soft. Bowel sounds are normal. There is no tenderness.  Musculoskeletal:       Right shoulder: She exhibits tenderness. She exhibits normal range of motion.  Neurological: She has normal strength and normal reflexes. No cranial nerve deficit or sensory deficit. She displays a negative Romberg sign.  Nursing note and vitals reviewed.      Assessment:     DM2 HTN Lumbar pain    Plan:     Check problem list.

## 2018-09-29 NOTE — Assessment & Plan Note (Signed)
??   Arthritis. Xray ordered. Continue Voltaren gel. Consider PT if there is no improvement. She agreed with the plan.

## 2018-10-14 ENCOUNTER — Other Ambulatory Visit: Payer: Self-pay | Admitting: Family Medicine

## 2018-10-14 DIAGNOSIS — Z1231 Encounter for screening mammogram for malignant neoplasm of breast: Secondary | ICD-10-CM

## 2018-10-22 ENCOUNTER — Other Ambulatory Visit: Payer: Self-pay | Admitting: Family Medicine

## 2018-11-13 ENCOUNTER — Other Ambulatory Visit: Payer: Self-pay

## 2018-11-13 ENCOUNTER — Ambulatory Visit (INDEPENDENT_AMBULATORY_CARE_PROVIDER_SITE_OTHER): Payer: Medicare Other | Admitting: Family Medicine

## 2018-11-13 ENCOUNTER — Encounter: Payer: Self-pay | Admitting: Family Medicine

## 2018-11-13 ENCOUNTER — Ambulatory Visit (HOSPITAL_COMMUNITY)
Admission: RE | Admit: 2018-11-13 | Discharge: 2018-11-13 | Disposition: A | Discharge status: Home / Self Care | Payer: Medicare Other | Source: Ambulatory Visit | Attending: Family Medicine | Admitting: Family Medicine

## 2018-11-13 VITALS — BP 106/60 | HR 67 | Temp 98.0°F | Wt 160.0 lb

## 2018-11-13 DIAGNOSIS — M545 Low back pain, unspecified: Secondary | ICD-10-CM

## 2018-11-13 DIAGNOSIS — R1310 Dysphagia, unspecified: Secondary | ICD-10-CM

## 2018-11-13 DIAGNOSIS — G8929 Other chronic pain: Secondary | ICD-10-CM

## 2018-11-13 HISTORY — DX: Dysphagia, unspecified: R13.10

## 2018-11-13 NOTE — Progress Notes (Signed)
Subjective:     Patient ID: [name], female   DOB: [birth year], 77 y.o.   MRN: [id number]  Back Pain  This is a chronic problem. The current episode started more than 1 month ago (2 months). The problem occurs intermittently. The problem has been gradually improving since onset. The pain is present in the lumbar spine. The pain does not radiate. The pain is at a severity of 5/10. The pain is mild. The symptoms are aggravated by position, bending and twisting. Associated symptoms include abdominal pain. Pertinent negatives include no chest pain, fever, leg pain, numbness, tingling or weakness. Treatments tried: She was using NSAID but stopped all medication now that she is getting better.  Abdominal Pain  This is a new problem. The current episode started more than 1 month ago. The onset quality is gradual. The problem occurs intermittently. The most recent episode lasted 1 month. The problem has been waxing and waning. The pain is located in the epigastric region. The pain is at a severity of 3/10. The pain is mild. The quality of the pain is dull and a sensation of fullness. The abdominal pain does not radiate. Pertinent negatives include no anorexia, constipation, diarrhea, fever, flatus, hematochezia or melena. Associated symptoms comments: Wheneever she eats, feels like food stuck un her chest and she will drink water to push it down. Loose stool on and off. Currently no diarrhea. The pain is aggravated by eating. The pain is relieved by liquids. She has tried nothing for the symptoms. The treatment provided moderate relief.   Current Outpatient Medications on File Prior to Visit  Medication Sig Dispense Refill  . amLODipine (NORVASC) 10 MG tablet TAKE ONE TABLET BY MOUTH DAILY (Patient taking differently: Take 10 mg by mouth once a day) 90 tablet 1  . aspirin 81 MG chewable tablet Chew 81 mg by mouth daily.      . carvedilol (COREG) 25 MG tablet TAKE ONE TABLET BY MOUTH TWICE A DAY WITH  MEALS 180 tablet 1  . ferrous sulfate 325 (65 FE) MG tablet Take 325 mg by mouth daily with breakfast.    . Garlic 5597 MG CAPS Take 1,000 mg by mouth daily.    . isosorbide mononitrate (IMDUR) 60 MG 24 hr tablet TAKE ONE TABLET BY MOUTH DAILY 90 tablet 2  . LANTUS 100 UNIT/ML injection INJECT 25 UNITS UNDER THE SKIN EVERY MORNING - HOLD IF GLUCOSE LEVEL IS LESS THAN 70 10 mL 3  . lisinopril-hydrochlorothiazide (PRINZIDE,ZESTORETIC) 20-12.5 MG tablet TAKE TWO TABLETS BY MOUTH DAILY 180 tablet 1  . loratadine (CLARITIN) 10 MG tablet Take 10 mg by mouth daily as needed for allergies.     . metFORMIN (GLUCOPHAGE-XR) 500 MG 24 hr tablet TAKE TWO TABLETS BY MOUTH TWICE A DAY 360 tablet 2  . Multiple Vitamin (MULTIVITAMIN WITH MINERALS) TABS tablet Take 1 tablet by mouth daily. Reported on 04/02/2016    . atorvastatin (LIPITOR) 20 MG tablet TAKE ONE TABLET BY MOUTH DAILY AT 6PM 90 tablet 3  . BD VEO INSULIN SYRINGE U/F 31G X 15/64" 1 ML MISC USE ONE SYRINGE ONCE DAILY AS DIRECTED 100 each 11  . Blood Glucose Monitoring Suppl (ONE TOUCH ULTRA MINI) w/Device KIT Check blood sugar TID  before meals. Diabetes Mellitus Type 2. E11.9 1 each 0  . diclofenac sodium (VOLTAREN) 1 % GEL Apply 4 g topically 4 (four) times daily. (Patient not taking: Reported on 11/13/2018) 100 g 0  . fluticasone (FLONASE) 50 MCG/ACT nasal  spray Place 2 sprays into the nose daily as needed. For allergies (Patient not taking: Reported on 11/13/2018) 16 g 6  . glucose blood (ONE TOUCH ULTRA TEST) test strip USE TO TEST BLOOD SUGAR 3 TIMES DAILY, BEFORE MEALS 100 each 4  . ibuprofen (ADVIL,MOTRIN) 400 MG tablet Take 1 tablet (400 mg total) by mouth every 8 (eight) hours as needed. (Patient not taking: Reported on 11/13/2018) 60 tablet 0  . meloxicam (MOBIC) 15 MG tablet Take 1 tablet (15 mg total) by mouth daily. (Patient not taking: Reported on 09/29/2018) 15 tablet 0  . nitroGLYCERIN (NITROSTAT) 0.4 MG SL tablet Place 1 tablet (0.4 mg  total) under the tongue every 5 (five) minutes as needed for chest pain. (Patient not taking: Reported on 03/05/2018) 25 tablet 3  . ONETOUCH DELICA LANCETS 76E MISC Check blood sugar TID  before meals. Diabetes Mellitus Type 2. E11.9 100 each 4   No current facility-administered medications on file prior to visit.    Past Medical History:  Diagnosis Date  . Anemia   . Aortic arch atherosclerosis ([place]) 08/29/2017  . Atypical chest pain 11/25/2011  . Cor pulmonale ([place])   . Coronary artery disease    a. Remote PTCA of the PDA;  b. 02/2009 PCI/CBA to the D1;  c. 11/2011 Cath: LM nl, LAD 20p, 43m D1 patent, RI 20p, LCX nl, RCA 30d, EF 55-65%.  . CTS (carpal tunnel syndrome)   . Diabetes [place], type II ([place]   . ETD (eustachian tube dysfunction)   . Gastritis   . GERD (gastroesophageal reflux disease)   . 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 History of thyroid cancer   . Hypercholesteremia   . Hypertension, benign   . Hypothyroid   . Myocardial infarction ([place]   . Nodule of right lung 08/29/2017   3 mm stable nodule right upper lobe on CTA Chest 08/28/17  . Postmenopausal   . 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 Subscapular pain, left 08/29/2017   Vitals:   11/13/18 1043  BP: 106/60  Pulse: 67  Temp: 98 F (36.7 C)  TempSrc: Oral  SpO2: 99%  Weight: 160 lb (72.6 kg)     Review of Systems  Constitutional: Negative for fever.  Respiratory: Negative.   Cardiovascular: Negative.  Negative for chest pain.  Gastrointestinal: Positive for abdominal pain. Negative for anorexia, constipation, diarrhea, flatus, hematochezia and melena.  Genitourinary: Negative.   Musculoskeletal: Positive for back pain.  Neurological: Negative for tingling, weakness and numbness.  All other systems reviewed and are negative.      Objective:   Physical Exam  Constitutional: She is oriented to person, place, and time. She  appears well-developed. No distress.  Cardiovascular: Normal rate, regular rhythm, normal heart sounds and intact distal pulses.  No murmur heard. Pulmonary/Chest: Effort normal and breath sounds normal. No stridor. No respiratory distress. She has no wheezes.  Abdominal: Soft. Bowel sounds are normal. She exhibits no distension and no mass. There is no tenderness. There is no rebound. No hernia.  Musculoskeletal: Normal range of motion. She exhibits no edema.       Lumbar back: Normal. She exhibits normal range of motion and no tenderness.  Neurological: She is alert and oriented to person, place, and time. No cranial nerve deficit. Coordination normal.  Nursing note and vitals reviewed.      Assessment:     Back pain Dysphagia  Plan:     Check problem list

## 2018-11-13 NOTE — Assessment & Plan Note (Signed)
Like steakhouse syndrome. ?? Achalasia vs esophageal stricture. Referral to GI done for EGD if recommended.

## 2018-11-13 NOTE — Assessment & Plan Note (Signed)
Improving. She will get her xray done today. May use Tylenol as needed for pain.

## 2018-11-13 NOTE — Patient Instructions (Signed)

## 2018-11-14 ENCOUNTER — Other Ambulatory Visit: Payer: Self-pay | Admitting: Family Medicine

## 2018-11-16 ENCOUNTER — Telehealth: Payer: Self-pay | Admitting: Family Medicine

## 2018-11-16 MED ORDER — DICLOFENAC SODIUM 1 % TD GEL: 4.0000 g | Freq: Four times a day (QID) | TRANSDERMAL | 2 refills | Status: DC

## 2018-11-16 NOTE — Telephone Encounter (Signed)
Xray result discussed with her. PT recommended but will like to hold off for now since she is much better. She requested refill of her pain meds. Voltaren refilled.

## 2018-11-24 ENCOUNTER — Ambulatory Visit
Admission: RE | Admit: 2018-11-24 | Discharge: 2018-11-24 | Disposition: A | Discharge status: Home / Self Care | Payer: Medicare Other | Source: Ambulatory Visit | Attending: Family Medicine | Admitting: Family Medicine

## 2018-11-24 DIAGNOSIS — Z1231 Encounter for screening mammogram for malignant neoplasm of breast: Secondary | ICD-10-CM

## 2019-01-05 ENCOUNTER — Other Ambulatory Visit: Payer: Self-pay

## 2019-01-05 ENCOUNTER — Encounter: Payer: Self-pay | Admitting: Family Medicine

## 2019-01-05 ENCOUNTER — Ambulatory Visit (INDEPENDENT_AMBULATORY_CARE_PROVIDER_SITE_OTHER): Payer: Medicare Other | Admitting: Family Medicine

## 2019-01-05 VITALS — BP 136/60 | Temp 98.9°F | Wt 159.0 lb

## 2019-01-05 DIAGNOSIS — E1159 Type 2 diabetes mellitus with other circulatory complications: Secondary | ICD-10-CM | POA: Diagnosis not present

## 2019-01-05 DIAGNOSIS — N898 Other specified noninflammatory disorders of vagina: Secondary | ICD-10-CM | POA: Diagnosis not present

## 2019-01-05 LAB — POCT WET PREP (WET MOUNT)
Clue Cells Wet Prep Whiff POC: NEGATIVE
TRICHOMONAS WET PREP HPF POC: ABSENT

## 2019-01-05 LAB — POCT GLYCOSYLATED HEMOGLOBIN (HGB A1C): HbA1c, POC (controlled diabetic range): 7.9 % — AB (ref 0.0–7.0)

## 2019-01-05 MED ORDER — FLUCONAZOLE 150 MG PO TABS: 150.0000 mg | ORAL_TABLET | Freq: Once | ORAL | 0 refills | Status: AC

## 2019-01-05 NOTE — Patient Instructions (Addendum)
It was great seeing you today! I am sorry that you have been having so much itching and discharge. While the wet prep came back negative, this is only as good as the sample I was able to get. Given your symptoms, I will go ahead and treat. I think that the odds that the test was inaccurate are greater than any potential side effects from diflucan. I will give you two doses of the diflucan. If it is not any better after these, please come back and see [place].  Regarding your a1c. It is higher today. It went from 7.1 to 7.9. It is very common for this to be higher after the holidays and for this reason I usually cut people some slack. Come back and see [place] in 3 months and if it is still this high, we might need to adjust your medications.

## 2019-01-08 ENCOUNTER — Encounter: Payer: Self-pay | Admitting: Family Medicine

## 2019-01-08 DIAGNOSIS — N898 Other specified noninflammatory disorders of vagina: Secondary | ICD-10-CM | POA: Insufficient documentation

## 2019-01-08 NOTE — Assessment & Plan Note (Signed)
Will treat as yeast infection. Diflucan 150 mg x1

## 2019-01-08 NOTE — Assessment & Plan Note (Signed)
A1C increased from 7.1 to 7.9. Patient feels as though this is due to poor diet and decreased exercise over the holidays. She would prefer to try and get her a1c back down with diet and exercise. Follow up in 3 months.

## 2019-01-08 NOTE — Progress Notes (Signed)
   HPI 78 year old female who presents for vaginal itching, discharge.  Patient states that she feels like she has a yeast infection.  She states that does not feel like it is urinary tract infection has not had any burning with urination.  She says the discharge has been white although she has not noticed any malodor to it.  She had no symptoms.  Wet prep was performed in office.  Was negative although the sample was felt to be adequate.  Hemoglobin A1c performed and increased from 7.1-7.9.  Patient states that she has not been eating well and not been as active due to the holidays.  She states she is been doing a little better since the new year and has been trying to "get back on the horse".  CC: Yeast infection   ROS:   Review of Systems See HPI for ROS.   CC, SH/smoking status, and VS noted  Objective: BP 136/60   Temp 98.9 F (37.2 C) (Oral)   Wt 159 lb (72.1 kg)   LMP 11/24/1992   BMI 28.17 kg/m  Gen: Well-appearing 78 year old African-American female, resting comfortably, no acute distress CV: RRR, no murmur Resp: CTAB, no wheezes, non-labored Abd: SNTND, BS present, no guarding or organomegaly Neuro: Alert and oriented, Speech clear, No gross deficits GU: Inserted easily with no difficulty.  Very little discharge noted.  No tenderness during GU exam   Assessment and plan:  Diabetes mellitus type 2, controlled (HCC) A1C increased from 7.1 to 7.9. Patient feels as though this is due to poor diet and decreased exercise over the holidays. She would prefer to try and get her a1c back down with diet and exercise. Follow up in 3 months.  Vaginal discharge Will treat as yeast infection. Diflucan 150 mg x1   Orders Placed This Encounter  Procedures  . HgB A1c  . POCT Wet Prep [facility])    Meds ordered this encounter  Medications  . fluconazole (DIFLUCAN) 150 MG tablet    Sig: Take 1 tablet (150 mg total) by mouth once for 1 dose.    Dispense:  2 tablet   Refill:  0     Guadalupe Dawn MD PGY-2 Family Medicine Resident  01/08/2019 11:41 PM

## 2019-01-19 ENCOUNTER — Other Ambulatory Visit: Payer: Self-pay

## 2019-01-19 ENCOUNTER — Encounter: Payer: Self-pay | Admitting: Family Medicine

## 2019-01-19 ENCOUNTER — Ambulatory Visit (INDEPENDENT_AMBULATORY_CARE_PROVIDER_SITE_OTHER): Payer: Medicare Other | Admitting: Family Medicine

## 2019-01-19 VITALS — BP 134/60 | HR 74 | Temp 98.8°F | Ht 63.0 in | Wt 159.0 lb

## 2019-01-19 DIAGNOSIS — D509 Iron deficiency anemia, unspecified: Secondary | ICD-10-CM

## 2019-01-19 DIAGNOSIS — R918 Other nonspecific abnormal finding of lung field: Secondary | ICD-10-CM | POA: Diagnosis not present

## 2019-01-19 DIAGNOSIS — E079 Disorder of thyroid, unspecified: Secondary | ICD-10-CM | POA: Diagnosis not present

## 2019-01-19 DIAGNOSIS — E1159 Type 2 diabetes mellitus with other circulatory complications: Secondary | ICD-10-CM

## 2019-01-19 DIAGNOSIS — R911 Solitary pulmonary nodule: Secondary | ICD-10-CM

## 2019-01-19 DIAGNOSIS — E785 Hyperlipidemia, unspecified: Secondary | ICD-10-CM

## 2019-01-19 DIAGNOSIS — E7800 Pure hypercholesterolemia, unspecified: Secondary | ICD-10-CM

## 2019-01-19 DIAGNOSIS — E039 Hypothyroidism, unspecified: Secondary | ICD-10-CM

## 2019-01-19 DIAGNOSIS — I10 Essential (primary) hypertension: Secondary | ICD-10-CM

## 2019-01-19 LAB — GLUCOSE, POCT (MANUAL RESULT ENTRY): POC Glucose: 164 mg/dl — AB (ref 70–99)

## 2019-01-19 NOTE — Assessment & Plan Note (Signed)
Stable BP. No adjustment needed.

## 2019-01-19 NOTE — Assessment & Plan Note (Signed)
Stable off meds. I reviewed and discussed CT report showing increase in her thyroid size.  She is aware and already discussed with her endocrinologist who recommended that they do nothing. TSH checked today. I will call with the result.

## 2019-01-19 NOTE — Progress Notes (Signed)
Subjective:     Patient ID: [name], female   DOB: 22-Jan-1941, 78 y.o.   MRN: [id number]  HPI DM2: Here for f/u. She is compliant with her medications but not with diet. Her highest home CBG readings in the past week was 140. Denies hypoglycemic episode.  HTN/HLD/Anemia:Here for f/u. No concern. Thyroid dysfunction:She sees endo once a year now. Per patient, she was cleared by them and told her she needed to come once a year. Pulm nodule:F/U. No concern.   Current Outpatient Medications on File Prior to Visit  Medication Sig Dispense Refill  . amLODipine (NORVASC) 10 MG tablet TAKE ONE TABLET BY MOUTH DAILY (Patient taking differently: Take 10 mg by mouth once a day) 90 tablet 1  . aspirin 81 MG chewable tablet Chew 81 mg by mouth daily.      [name] atorvastatin (LIPITOR) 20 MG tablet TAKE ONE TABLET BY MOUTH DAILY AT 6PM 90 tablet 3  . carvedilol (COREG) 25 MG tablet TAKE ONE TABLET BY MOUTH TWICE A DAY WITH MEALS 180 tablet 1  . ferrous sulfate 325 (65 FE) MG tablet Take 325 mg by mouth daily with breakfast.    . isosorbide mononitrate (IMDUR) 60 MG 24 hr tablet TAKE ONE TABLET BY MOUTH DAILY 90 tablet 1  . LANTUS 100 UNIT/ML injection INJECT 25 UNITS INTO THE SKIN EVERY MORNING *HOLD IF GLUCOSE LEVEL IS LESS THAN 70* 30 mL 1  . lisinopril-hydrochlorothiazide (PRINZIDE,ZESTORETIC) 20-12.5 MG tablet TAKE TWO TABLETS BY MOUTH DAILY 180 tablet 1  . metFORMIN (GLUCOPHAGE-XR) 500 MG 24 hr tablet TAKE TWO TABLETS BY MOUTH TWICE A DAY 360 tablet 2  . Multiple Vitamin (MULTIVITAMIN WITH MINERALS) TABS tablet Take 1 tablet by mouth daily. Reported on 04/02/2016    . BD VEO INSULIN SYRINGE U/F 31G X 15/64" 1 ML MISC USE ONE SYRINGE ONCE DAILY AS DIRECTED 100 each 11  . Blood Glucose Monitoring Suppl (ONE TOUCH ULTRA MINI) w/Device KIT Check blood sugar TID  before meals. Diabetes Mellitus Type 2. E11.9 1 each 0  . diclofenac sodium (VOLTAREN) 1 % GEL Apply 4 g topically 4 (four) times daily.  Use only when in pain. Hold if pain is controlled 100 g 2  . fluticasone (FLONASE) 50 MCG/ACT nasal spray Place 2 sprays into the nose daily as needed. For allergies (Patient not taking: Reported on 11/13/2018) 16 g 6  . Garlic 0623 MG CAPS Take 1,000 mg by mouth daily.    [name] glucose blood (ONE TOUCH ULTRA TEST) test strip USE TO TEST BLOOD SUGAR 3 TIMES DAILY, BEFORE MEALS 100 each 4  . loratadine (CLARITIN) 10 MG tablet Take 10 mg by mouth daily as needed for allergies.     . nitroGLYCERIN (NITROSTAT) 0.4 MG SL tablet Place 1 tablet (0.4 mg total) under the tongue every 5 (five) minutes as needed for chest pain. (Patient not taking: Reported on 03/05/2018) 25 tablet 3  . ONETOUCH DELICA LANCETS 76E MISC Check blood sugar TID  before meals. Diabetes Mellitus Type 2. E11.9 100 each 4   No current facility-administered medications on file prior to visit.    Past Medical History:  Diagnosis Date  . Anemia   . Aortic arch atherosclerosis ([place]) 08/29/2017  . Atypical chest pain 11/25/2011  . Cor pulmonale ([place])   . Coronary artery disease    a. Remote PTCA of the PDA;  b. 02/2009 PCI/CBA to the D1;  c. 11/2011 Cath: LM nl, LAD 20p, 21m D1 patent, RI  20p, LCX nl, RCA 30d, EF 55-65%.  . CTS (carpal tunnel syndrome)   . Diabetes mellitus, type II ([place])   . ETD (eustachian tube dysfunction)   . Gastritis   . GERD (gastroesophageal reflux disease)   . 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 History of thyroid cancer   . Hypercholesteremia   . Hypertension, benign   . Hypothyroid   . Myocardial infarction ([place])   . Nodule of right lung 08/29/2017   3 mm stable nodule right upper lobe on CTA Chest 08/28/17  . Postmenopausal   . 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 Subscapular pain, left 08/29/2017     Review of Systems  Respiratory: Negative.   Cardiovascular: Negative.   Gastrointestinal: Negative.   Musculoskeletal:  Negative.   Neurological: Negative.   All other systems reviewed and are negative.      Objective:   Physical Exam Vitals signs and nursing note reviewed.  Constitutional:      Appearance: Normal appearance. She is not ill-appearing.  Neck:     Musculoskeletal: Normal range of motion and neck supple.     Comments: + Thyromegaly,small nodule on the right. Cardiovascular:     Rate and Rhythm: Normal rate and regular rhythm.     Pulses: Normal pulses.     Heart sounds: Normal heart sounds. No murmur.  Pulmonary:     Effort: Pulmonary effort is normal.     Breath sounds: Normal breath sounds. No wheezing or rhonchi.  Abdominal:     General: Abdomen is flat. Bowel sounds are normal.     Palpations: There is no mass.     Tenderness: There is no abdominal tenderness.  Musculoskeletal: Normal range of motion.  Neurological:     General: No focal deficit present.     Mental Status: She is alert and oriented to person, place, and time.        Assessment:     DM2 HLD HTN Anemia Pulm nodule Hypothyroidism    Plan:     Check problem list.

## 2019-01-19 NOTE — Assessment & Plan Note (Signed)
Check FLP today. 

## 2019-01-19 NOTE — Patient Instructions (Signed)
Pulmonary Nodule  A pulmonary nodule is tissue that has grown on your lung. A nodule may be cancer, but most nodules are not cancer.  Follow these instructions at home:     Take over-the-counter and prescription medicines only as told by your doctor.   Do not use any products that have nicotine or tobacco, such as cigarettes and e-cigarettes. If you need help quitting, ask your doctor.   Keep all follow-up visits as told by your doctor. This is important.  Contact a doctor if:   You have trouble breathing when doing activities.   You feel sick.   You feel more tired than normal.   You do not feel like eating.   You lose weight without trying.   You have chills.   You have night sweats.  Get help right away if:   You cannot catch your breath.   You start making whistling sounds when breathing (wheezing).   You cannot stop coughing.   You cough up blood.   You get dizzy.   You feel like you are going to pass out (faint).   You have sudden chest pain.   You have a fever or symptoms for more than 2-3 days.   You have a fever and your symptoms suddenly get worse.  Summary   A pulmonary nodule is tissue that has grown on your lung.   Most nodules are not cancer.   Your doctor will do tests to know what kind of nodule you have, and whether you need treatment for it.  This information is not intended to replace advice given to you by your health care provider. Make sure you discuss any questions you have with your health care provider.  Document Released: 01/11/2011 Document Revised: 01/07/2017 Document Reviewed: 01/07/2017  Elsevier Interactive Patient Education  2019 [employer]

## 2019-01-19 NOTE — Assessment & Plan Note (Signed)
Asymptomatic. Check CBC today.

## 2019-01-19 NOTE — Assessment & Plan Note (Signed)
Asymptomatic. CT discussed with her again. She will like to recheck lungs. CT ordered.

## 2019-01-19 NOTE — Assessment & Plan Note (Signed)
A1C worsen due to poor diet. Counseling provided. She will work on it. Continue current DM regimen. Return in 3 months for A1C check.

## 2019-01-20 ENCOUNTER — Telehealth: Payer: Self-pay | Admitting: Family Medicine

## 2019-01-20 LAB — LIPID PANEL
Chol/HDL Ratio: 2.8 ratio (ref 0.0–4.4)
Cholesterol, Total: 120 mg/dL (ref 100–199)
HDL: 43 mg/dL (ref >39)
LDL Calculated: 51 mg/dL (ref 0–99)
Triglycerides: 131 mg/dL (ref 0–149)
VLDL Cholesterol Cal: 26 mg/dL (ref 5–40)

## 2019-01-20 LAB — CBC
HEMATOCRIT: 29.1 % — AB (ref 34.0–46.6)
HEMOGLOBIN: 9.3 g/dL — AB (ref 11.1–15.9)
MCH: 27.4 pg (ref 26.6–33.0)
MCHC: 32 g/dL (ref 31.5–35.7)
MCV: 86 fL (ref 79–97)
Platelets: 126 10*3/uL — ABNORMAL LOW (ref 150–450)
RBC: 3.39 x10E6/uL — AB (ref 3.77–5.28)
RDW: 14.2 % (ref 11.7–15.4)
WBC: 4.5 10*3/uL (ref 3.4–10.8)

## 2019-01-20 LAB — TSH: TSH: 0.875 u[IU]/mL (ref 0.450–4.500)

## 2019-01-20 NOTE — Telephone Encounter (Signed)
Tes result discussed. Anemia persist: Start Iron supplement which she had been on in the past. Return for anemia panel in 2-3 weeks.  Thrombocytopenia. Etiology unclear.  Zestoretic and Coreg can cause anemia and thrombocytopenia. Continue meds for now. Call if bleeding or bruising.  Repeat labs in 2 weeks.  Consider medication adjustment if still abnormal.  She agreed with the plan.

## 2019-01-25 ENCOUNTER — Ambulatory Visit
Admission: RE | Admit: 2019-01-25 | Discharge: 2019-01-25 | Disposition: A | Discharge status: Home / Self Care | Payer: Medicare Other | Source: Ambulatory Visit | Attending: Family Medicine | Admitting: Family Medicine

## 2019-01-25 DIAGNOSIS — R918 Other nonspecific abnormal finding of lung field: Secondary | ICD-10-CM

## 2019-02-09 ENCOUNTER — Ambulatory Visit: Payer: Medicare Other | Admitting: Family Medicine

## 2019-02-09 ENCOUNTER — Encounter: Payer: Self-pay | Admitting: Family Medicine

## 2019-02-09 ENCOUNTER — Other Ambulatory Visit: Payer: Self-pay

## 2019-02-09 VITALS — BP 134/52 | HR 73 | Temp 98.8°F | Wt 158.4 lb

## 2019-02-09 DIAGNOSIS — E1159 Type 2 diabetes mellitus with other circulatory complications: Secondary | ICD-10-CM | POA: Diagnosis not present

## 2019-02-09 DIAGNOSIS — D649 Anemia, unspecified: Secondary | ICD-10-CM | POA: Diagnosis not present

## 2019-02-09 DIAGNOSIS — D696 Thrombocytopenia, unspecified: Secondary | ICD-10-CM | POA: Diagnosis not present

## 2019-02-09 NOTE — Assessment & Plan Note (Signed)
No changes to her medication. She will obtain ophthalmology report for review.

## 2019-02-09 NOTE — Assessment & Plan Note (Signed)
May be medication induced. Repeat lab today. Consider medication adjustment if needed.

## 2019-02-09 NOTE — Progress Notes (Signed)
Subjective:     Patient ID: [name], female   DOB: 1941-07-22, 78 y.o.   MRN: [id number]  HPI Anemia:Denies GI bleed. Denies dizziness or weakness. She is currently on Iron supplement. Low platelet: Denies excessive bleeding or bruising. Here for follow-up. DM2:Compliant with her meds. No concern. She had her eye exam done in January.   Current Outpatient Medications on File Prior to Visit  Medication Sig Dispense Refill  . amLODipine (NORVASC) 10 MG tablet TAKE ONE TABLET BY MOUTH DAILY (Patient taking differently: Take 10 mg by mouth once a day) 90 tablet 1  . aspirin 81 MG chewable tablet Chew 81 mg by mouth daily.      [name] atorvastatin (LIPITOR) 20 MG tablet TAKE ONE TABLET BY MOUTH DAILY AT 6PM 90 tablet 3  . BD VEO INSULIN SYRINGE U/F 31G X 15/64" 1 ML MISC USE ONE SYRINGE ONCE DAILY AS DIRECTED 100 each 11  . Blood Glucose Monitoring Suppl (ONE TOUCH ULTRA MINI) w/Device KIT Check blood sugar TID  before meals. Diabetes Mellitus Type 2. E11.9 1 each 0  . carvedilol (COREG) 25 MG tablet TAKE ONE TABLET BY MOUTH TWICE A DAY WITH MEALS 180 tablet 1  . diclofenac sodium (VOLTAREN) 1 % GEL Apply 4 g topically 4 (four) times daily. Use only when in pain. Hold if pain is controlled 100 g 2  . ferrous sulfate 325 (65 FE) MG tablet Take 325 mg by mouth daily with breakfast.    . fluticasone (FLONASE) 50 MCG/ACT nasal spray Place 2 sprays into the nose daily as needed. For allergies (Patient not taking: Reported on 11/13/2018) 16 g 6  . Garlic 3833 MG CAPS Take 1,000 mg by mouth daily.    [name] glucose blood (ONE TOUCH ULTRA TEST) test strip USE TO TEST BLOOD SUGAR 3 TIMES DAILY, BEFORE MEALS 100 each 4  . isosorbide mononitrate (IMDUR) 60 MG 24 hr tablet TAKE ONE TABLET BY MOUTH DAILY 90 tablet 1  . LANTUS 100 UNIT/ML injection INJECT 25 UNITS INTO THE SKIN EVERY MORNING *HOLD IF GLUCOSE LEVEL IS LESS THAN 70* 30 mL 1  . lisinopril-hydrochlorothiazide (PRINZIDE,ZESTORETIC) 20-12.5 MG  tablet TAKE TWO TABLETS BY MOUTH DAILY 180 tablet 1  . loratadine (CLARITIN) 10 MG tablet Take 10 mg by mouth daily as needed for allergies.     . metFORMIN (GLUCOPHAGE-XR) 500 MG 24 hr tablet TAKE TWO TABLETS BY MOUTH TWICE A DAY 360 tablet 2  . Multiple Vitamin (MULTIVITAMIN WITH MINERALS) TABS tablet Take 1 tablet by mouth daily. Reported on 04/02/2016    . nitroGLYCERIN (NITROSTAT) 0.4 MG SL tablet Place 1 tablet (0.4 mg total) under the tongue every 5 (five) minutes as needed for chest pain. (Patient not taking: Reported on 03/05/2018) 25 tablet 3  . ONETOUCH DELICA LANCETS 38V MISC Check blood sugar TID  before meals. Diabetes Mellitus Type 2. E11.9 100 each 4   No current facility-administered medications on file prior to visit.    Past Medical History:  Diagnosis Date  . Anemia   . Aortic arch atherosclerosis ([place]) 08/29/2017  . Atypical chest pain 11/25/2011  . Cor pulmonale ([place])   . Coronary artery disease    a. Remote PTCA of the PDA;  b. 02/2009 PCI/CBA to the D1;  c. 11/2011 Cath: LM nl, LAD 20p, 74m D1 patent, RI 20p, LCX nl, RCA 30d, EF 55-65%.  . CTS (carpal tunnel syndrome)   . Diabetes mellitus, type II ([place]   . ETD (eustachian  tube dysfunction)   . Gastritis   . GERD (gastroesophageal reflux disease)   . 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 History of thyroid cancer   . Hypercholesteremia   . Hypertension, benign   . Hypothyroid   . Myocardial infarction ([place])   . Nodule of right lung 08/29/2017   3 mm stable nodule right upper lobe on CTA Chest 08/28/17  . Postmenopausal   . 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 Subscapular pain, left 08/29/2017   Vitals:   02/09/19 1115  BP: (!) 134/52  Pulse: 73  Temp: 98.8 F (37.1 C)  TempSrc: Oral  SpO2: 98%  Weight: 158 lb 6 oz (71.8 kg)     Review of Systems  Constitutional: Negative.   Respiratory: Negative.   Cardiovascular: Negative.    Gastrointestinal: Negative.   Genitourinary: Negative.   Skin: Negative.   Neurological: Negative.   All other systems reviewed and are negative.      Objective:   Physical Exam Vitals signs and nursing note reviewed.  Constitutional:      Appearance: Normal appearance.  Cardiovascular:     Rate and Rhythm: Normal rate and regular rhythm.     Heart sounds: Normal heart sounds. No murmur.  Pulmonary:     Effort: Pulmonary effort is normal. No respiratory distress.     Breath sounds: Normal breath sounds. No wheezing.  Abdominal:     General: Abdomen is flat. Bowel sounds are normal. There is no distension.     Palpations: There is no mass.     Tenderness: There is no abdominal tenderness.  Neurological:     Mental Status: She is alert.        Assessment:     Anemia Thrombocytopenia DM2    Plan:     Check problem list.

## 2019-02-09 NOTE — Patient Instructions (Signed)
Anemia  Anemia is a condition in which you do not have enough red blood cells or hemoglobin. Hemoglobin is a substance in red blood cells that carries oxygen. When you do not have enough red blood cells or hemoglobin (are anemic), your body cannot get enough oxygen and your organs may not work properly. As a result, you may feel very tired or have other problems. What are the causes? Common causes of anemia include:  Excessive bleeding. Anemia can be caused by excessive bleeding inside or outside the body, including bleeding from the intestine or from periods in women.  Poor nutrition.  Long-lasting (chronic) kidney, thyroid, and liver disease.  Bone marrow disorders.  Cancer and treatments for cancer.  HIV (human immunodeficiency virus) and AIDS (acquired immunodeficiency syndrome).  Treatments for HIV and AIDS.  Spleen problems.  Blood disorders.  Infections, medicines, and autoimmune disorders that destroy red blood cells. What are the signs or symptoms? Symptoms of this condition include:  Minor weakness.  Dizziness.  Headache.  Feeling heartbeats that are irregular or faster than normal (palpitations).  Shortness of breath, especially with exercise.  Paleness.  Cold sensitivity.  Indigestion.  Nausea.  Difficulty sleeping.  Difficulty concentrating. Symptoms may occur suddenly or develop slowly. If your anemia is mild, you may not have symptoms. How is this diagnosed? This condition is diagnosed based on:  Blood tests.  Your medical history.  A physical exam.  Bone marrow biopsy. Your health care provider may also check your stool (feces) for blood and may do additional testing to look for the cause of your bleeding. You may also have other tests, including:  Imaging tests, such as a CT scan or MRI.  Endoscopy.  Colonoscopy. How is this treated? Treatment for this condition depends on the cause. If you continue to lose a lot of blood, you may  need to be treated at a hospital. Treatment may include:  Taking supplements of iron, vitamin S31, or folic acid.  Taking a hormone medicine (erythropoietin) that can help to stimulate red blood cell growth.  Having a blood transfusion. This may be needed if you lose a lot of blood.  Making changes to your diet.  Having surgery to remove your spleen. Follow these instructions at home:  Take over-the-counter and prescription medicines only as told by your health care provider.  Take supplements only as told by your health care provider.  Follow any diet instructions that you were given.  Keep all follow-up visits as told by your health care provider. This is important. Contact a health care provider if:  You develop new bleeding anywhere in the body. Get help right away if:  You are very weak.  You are short of breath.  You have pain in your abdomen or chest.  You are dizzy or feel faint.  You have trouble concentrating.  You have bloody or black, tarry stools.  You vomit repeatedly or you vomit up blood. Summary  Anemia is a condition in which you do not have enough red blood cells or enough of a substance in your red blood cells that carries oxygen (hemoglobin).  Symptoms may occur suddenly or develop slowly.  If your anemia is mild, you may not have symptoms.  This condition is diagnosed with blood tests as well as a medical history and physical exam. Other tests may be needed.  Treatment for this condition depends on the cause of the anemia. This information is not intended to replace advice given to you by  your health care provider. Make sure you discuss any questions you have with your health care provider. Document Released: 01/16/2005 Document Revised: 01/10/2017 Document Reviewed: 01/10/2017 Elsevier Interactive Patient Education  2019 [employer].

## 2019-02-09 NOTE — Assessment & Plan Note (Signed)
Continue ferrous sulfate. Lab rechecked today with anemia panel. I will contact her with the result.

## 2019-02-10 ENCOUNTER — Other Ambulatory Visit: Payer: Self-pay | Admitting: Family Medicine

## 2019-02-10 DIAGNOSIS — D649 Anemia, unspecified: Secondary | ICD-10-CM

## 2019-02-10 LAB — ANEMIA PROFILE B
BASOS: 1 %
Basophils Absolute: 0.1 10*3/uL (ref 0.0–0.2)
EOS (ABSOLUTE): 0.2 10*3/uL (ref 0.0–0.4)
EOS: 3 %
Ferritin: 23 ng/mL (ref 15–150)
Folate: >20 ng/mL (ref >3.0)
HEMATOCRIT: 28.9 % — AB (ref 34.0–46.6)
Hemoglobin: 9.6 g/dL — ABNORMAL LOW (ref 11.1–15.9)
IMMATURE GRANS (ABS): 0 10*3/uL (ref 0.0–0.1)
IMMATURE GRANULOCYTES: 0 %
IRON SATURATION: 20 % (ref 15–55)
IRON: 70 ug/dL (ref 27–139)
LYMPHS ABS: 2.3 10*3/uL (ref 0.7–3.1)
Lymphs: 43 %
MCH: 27.6 pg (ref 26.6–33.0)
MCHC: 33.2 g/dL (ref 31.5–35.7)
MCV: 83 fL (ref 79–97)
MONOS ABS: 0.5 10*3/uL (ref 0.1–0.9)
Monocytes: 10 %
NEUTROS ABS: 2.3 10*3/uL (ref 1.4–7.0)
NEUTROS PCT: 43 %
Platelets: 132 10*3/uL — ABNORMAL LOW (ref 150–450)
RBC: 3.48 x10E6/uL — ABNORMAL LOW (ref 3.77–5.28)
RDW: 14.3 % (ref 11.7–15.4)
Retic Ct Pct: 1.1 % (ref 0.6–2.6)
Total Iron Binding Capacity: 343 ug/dL (ref 250–450)
UIBC: 273 ug/dL (ref 118–369)
Vitamin B-12: 567 pg/mL (ref 232–1245)
WBC: 5.3 10*3/uL (ref 3.4–10.8)

## 2019-02-10 NOTE — Progress Notes (Signed)
I called [facility] and added peripheral blood smear on to her Anemia panel. They will let me know if the blood specimen is adequate for add-on

## 2019-02-12 ENCOUNTER — Telehealth: Payer: Self-pay | Admitting: Family Medicine

## 2019-02-12 DIAGNOSIS — D649 Anemia, unspecified: Secondary | ICD-10-CM

## 2019-02-12 DIAGNOSIS — D696 Thrombocytopenia, unspecified: Secondary | ICD-10-CM

## 2019-02-12 LAB — PATHOLOGIST SMEAR REVIEW
Basophils Absolute: 0.1 10*3/uL (ref 0.0–0.2)
Basos: 1 %
EOS (ABSOLUTE): 0.2 10*3/uL (ref 0.0–0.4)
Eos: 3 %
Hematocrit: 31.9 % — ABNORMAL LOW (ref 34.0–46.6)
Hemoglobin: 9.7 g/dL — ABNORMAL LOW (ref 11.1–15.9)
Immature Grans (Abs): 0 10*3/uL (ref 0.0–0.1)
Immature Granulocytes: 1 %
LYMPHS ABS: 2.3 10*3/uL (ref 0.7–3.1)
Lymphs: 43 %
MCH: 27.6 pg (ref 26.6–33.0)
MCHC: 30.4 g/dL — AB (ref 31.5–35.7)
MCV: 91 fL (ref 79–97)
MONOCYTES: 9 %
Monocytes Absolute: 0.5 10*3/uL (ref 0.1–0.9)
NEUTROS ABS: 2.3 10*3/uL (ref 1.4–7.0)
Neutrophils: 43 %
PLATELETS: 142 10*3/uL — AB (ref 150–450)
RBC: 3.51 x10E6/uL — AB (ref 3.77–5.28)
RDW: 14.4 % (ref 11.7–15.4)
WBC: 5.3 10*3/uL (ref 3.4–10.8)

## 2019-02-12 LAB — SPECIMEN STATUS REPORT

## 2019-02-12 NOTE — Telephone Encounter (Signed)
Result discussed with patient.  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 Recovery effort not seen" and "Thrombocytopenia. Morphology normal. Cause not evident"  ?? Related to meds vs Bone marrow disease. Referral to hematology recommended. She agreed with the plan. Referral placed.

## 2019-02-19 ENCOUNTER — Telehealth: Payer: Self-pay | Admitting: Internal Medicine

## 2019-02-19 NOTE — Telephone Encounter (Signed)
A new hem appt has been scheduled for the pt to see Dr. Walden Field on 3/5 at 1050am. Pt has been made aware of the appt date and time.

## 2019-02-25 ENCOUNTER — Inpatient Hospital Stay: Payer: Medicare Other | Attending: Internal Medicine | Admitting: Internal Medicine

## 2019-02-25 ENCOUNTER — Telehealth: Payer: Self-pay | Admitting: Internal Medicine

## 2019-02-25 ENCOUNTER — Other Ambulatory Visit: Payer: Self-pay

## 2019-02-25 ENCOUNTER — Encounter: Payer: Self-pay | Admitting: Internal Medicine

## 2019-02-25 VITALS — BP 174/60 | HR 77 | Temp 98.6°F | Resp 17 | Ht 63.0 in | Wt 158.6 lb

## 2019-02-25 DIAGNOSIS — Z7982 Long term (current) use of aspirin: Secondary | ICD-10-CM | POA: Diagnosis not present

## 2019-02-25 DIAGNOSIS — E119 Type 2 diabetes mellitus without complications: Secondary | ICD-10-CM | POA: Diagnosis not present

## 2019-02-25 DIAGNOSIS — Z794 Long term (current) use of insulin: Secondary | ICD-10-CM | POA: Insufficient documentation

## 2019-02-25 DIAGNOSIS — Z85850 Personal history of malignant neoplasm of thyroid: Secondary | ICD-10-CM | POA: Diagnosis not present

## 2019-02-25 DIAGNOSIS — E7800 Pure hypercholesterolemia, unspecified: Secondary | ICD-10-CM | POA: Diagnosis not present

## 2019-02-25 DIAGNOSIS — D509 Iron deficiency anemia, unspecified: Secondary | ICD-10-CM | POA: Diagnosis present

## 2019-02-25 DIAGNOSIS — E039 Hypothyroidism, unspecified: Secondary | ICD-10-CM | POA: Insufficient documentation

## 2019-02-25 DIAGNOSIS — D696 Thrombocytopenia, unspecified: Secondary | ICD-10-CM | POA: Diagnosis not present

## 2019-02-25 DIAGNOSIS — Z87891 Personal history of nicotine dependence: Secondary | ICD-10-CM | POA: Diagnosis not present

## 2019-02-25 DIAGNOSIS — K649 Unspecified hemorrhoids: Secondary | ICD-10-CM | POA: Diagnosis not present

## 2019-02-25 DIAGNOSIS — K589 Irritable bowel syndrome without diarrhea: Secondary | ICD-10-CM | POA: Insufficient documentation

## 2019-02-25 DIAGNOSIS — I252 Old myocardial infarction: Secondary | ICD-10-CM | POA: Insufficient documentation

## 2019-02-25 DIAGNOSIS — I10 Essential (primary) hypertension: Secondary | ICD-10-CM | POA: Insufficient documentation

## 2019-02-25 DIAGNOSIS — K219 Gastro-esophageal reflux disease without esophagitis: Secondary | ICD-10-CM | POA: Insufficient documentation

## 2019-02-25 DIAGNOSIS — Z79899 Other long term (current) drug therapy: Secondary | ICD-10-CM | POA: Diagnosis not present

## 2019-02-25 DIAGNOSIS — I2510 Atherosclerotic heart disease of native coronary artery without angina pectoris: Secondary | ICD-10-CM | POA: Insufficient documentation

## 2019-02-25 DIAGNOSIS — D508 Other iron deficiency anemias: Secondary | ICD-10-CM

## 2019-02-25 NOTE — Telephone Encounter (Signed)
Scheduled appt per 3/5 los. ° °Printed calendar and avs. °

## 2019-02-25 NOTE — Progress Notes (Signed)
Referring physician: Dr.  Gwendlyn Deutscher  Diagnosis Other iron deficiency anemia - Plan: CBC with Differential/Platelet, Comprehensive metabolic panel, Ferritin, Lactate dehydrogenase, Protein electrophoresis, serum, Hemoglobinopathy evaluation, Vitamin B12, Folate, Methylmalonic acid, serum, Haptoglobin, Iron and TIBC, Transferrin Saturation  Staging Cancer Staging No matching staging information was found for the patient.  Assessment and Plan:  1.  Iron deficiency anemia. 78 year old patient referred for consultation due to anemia.  She reports she has been on iron tablets for quite some time.  The patient had blood work done January 19, 2019 which showed a white count 4.5 hemoglobin 9.3 platelets 126,000.  Blood counts done February 09, 2019 showed a white count 5.3 hemoglobin 9.6 platelets 132,000.  She has a normal differential.  Ferritin was decreased at 23.  Folate was normal B12 was normal.  The patient reports history of diverticulitis.  She also reports history of hemorrhoids.  Last colonoscopy was 07/02/2013 and showed diverticuli as well as hemorrhoids.  Patient denies any craving of ice or starch.  She is seen today for consultation due to iron deficiency anemia.  Ferritin level is decreased at 23 which is consistent with iron deficiency anemia.  She has reportedly been taking iron tablets but has an intolerance for oral iron.  She is recommended for Feraheme 510 mg IV day 1 and day 8.  Side effects of the medication were reviewed with the patient which include an allergic reaction.  She will follow-up in 6 to 8 weeks after IV iron for repeat labs to assess response to IV iron therapy.  She will be referred to GI for evaluation.  2.  Thrombocytopenia.  Platelet count is adequate at 132,000.  She has a normal differential.  Will repeat labs after IV iron.  3.  Hypertension.  Blood pressure is 174/60.  She should follow-up with primary care physician.  4.  Health maintenance.  Patient had a  mammogram December 2019 that was negative.  Screening mammogram is recommended.  Patient will be referred to GI for evaluation due to iron deficiency anemia.  40 minutes spent with more than 50% in review of records, counseling and coordination of care.  HPI: 78 year old patient referred for consultation due to anemia.  She reports she has been on iron tablets for quite some time.  The patient had blood work done January 19, 2019 which showed a white count 4.5 hemoglobin 9.3 platelets 126,000.  Blood counts done February 09, 2019 showed a white count 5.3 hemoglobin 9.6 platelets 132,000.  She has a normal differential.  Ferritin was decreased at 23.  Folate was normal B12 was normal.  The patient reports history of diverticulitis.  She also reports history of hemorrhoids.  Last colonoscopy was 07/02/2013 and showed diverticuli as well as hemorrhoids.  Patient denies any craving of ice or starch.  She is seen today for consultation due to iron deficiency anemia.  Problem List Patient Active Problem List   Diagnosis Date Noted  . Iron deficiency anemia [D50.9] 02/25/2019  . Thrombocytopenia ([place]) [D69.6] 02/09/2019  . Dysphagia [R13.10] 11/13/2018  . Muscle spasm of back [M62.830] 09/12/2018  . Low back pain [M54.5] 06/26/2018  . Subscapular pain, left [M25.512] 08/29/2017  . 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 [Z95.5] 08/29/2017  . Nodule of right lung [R91.1] 08/29/2017  . 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 Liver cyst [K76.89]   . Thickened nail [L60.2] 03/01/2016  . IBS (irritable bowel syndrome) [K58.9] 12/27/2014  . Left thyroid nodule [E04.1] 07/27/2014  .  Thyroid enlargement [E04.9] 04/12/2013  . History of thyroid cancer [Z85.850] 04/12/2013  . Hypothyroidism [E03.9] 02/19/2007  . Diabetes mellitus type 2, controlled ([place]) [E11.9] 02/19/2007  . HYPERCHOLESTEROLEMIA [E78.00] 02/19/2007  . ANEMIA, OTHER, UNSPECIFIED [D64.9] 02/19/2007  . CARPAL  TUNNEL SYNDROME [G56.00] 02/19/2007  . HYPERTENSION, BENIGN SYSTEMIC [I10] 02/19/2007  . Coronary atherosclerosis [I25.10] 02/19/2007  . 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 Anemia   . Aortic arch atherosclerosis ([place]) 08/29/2017  . Atypical chest pain 11/25/2011  . Cor pulmonale ([place])   . Coronary artery disease    a. Remote PTCA of the PDA;  b. 02/2009 PCI/CBA to the D1;  c. 11/2011 Cath: LM nl, LAD 20p, 60m D1 patent, RI 20p, LCX nl, RCA 30d, EF 55-65%.  . CTS (carpal tunnel syndrome)   . Diabetes mellitus, type II ([place]   . ETD (eustachian tube dysfunction)   . Gastritis   . GERD (gastroesophageal reflux disease)   . 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 History of thyroid cancer   . Hypercholesteremia   . Hypertension, benign   . Hypothyroid   . Iron deficiency anemia 02/25/2019  . Myocardial infarction ([place]   . Nodule of right lung 08/29/2017   3 mm stable nodule right upper lobe on CTA Chest 08/28/17  . Postmenopausal   . 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 Subscapular pain, left 08/29/2017    Past Surgical History Past Surgical History:  Procedure Laterality Date  . ANGIOPLASTY     PDA 90-40 %  . BARTHOLIN GLAND CYST EXCISION  09-13-2004   Keratosis- no CA  . BREAST BIOPSY     LEFT SPOT REMOVED  . BREAST EXCISIONAL BIOPSY Left   . CARDIAC CATHETERIZATION  03/15/2009   stenosis of diagonal branch of LAD- PTCA performed  . CHOLECYSTECTOMY  08-23-2002  . CORONARY ANGIOPLASTY    . ENDOMETRIAL BIOPSY  02-20-1998  . LEFT HEART CATHETERIZATION WITH CORONARY ANGIOGRAM N/A 11/26/2011   Procedure: LEFT HEART CATHETERIZATION WITH CORONARY ANGIOGRAM;  Surgeon: Peter M JMartinique MD;  [facility];  Service: Cardiovascular;  Laterality: N/A;  . PTCA  10/23/2000 and 03/15/09  . 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  TONSILLECTOMY    . TUBAL LIGATION      Family History Family History  Problem Relation Age of Onset  . Diabetes Mother        Deceased- hypoglycemic coma   . Hypertension Mother   . Other Father        unaware of his health history - died when pt was 78yr old.  .Marland KitchenHeart attack Sister   . Diabetes Mellitus II Brother   . Heart attack Brother   . Heart attack Brother   . Diabetes Mellitus II Brother   . Epilepsy Brother   . Breast cancer Daughter      Social History  reports that she quit smoking about 38 years ago. Her smoking use included cigarettes. She quit smokeless tobacco use about 24 years ago.  Her smokeless tobacco use included chew. She reports that she does not drink alcohol or use drugs.  Medications  Current Outpatient Medications:  .  amLODipine (NORVASC) 10 MG tablet, TAKE ONE TABLET BY MOUTH DAILY (Patient taking differently: Take 10 mg by mouth once a day), Disp: 90 tablet, Rfl: 1 .  aspirin 81 MG chewable tablet, Chew 81 mg by mouth daily.  , Disp: , Rfl:  .  atorvastatin (LIPITOR) 20 MG tablet, TAKE ONE TABLET BY MOUTH DAILY AT 6PM, Disp: 90 tablet, Rfl: 3 .  BD VEO INSULIN SYRINGE U/F 31G X 15/64" 1 ML MISC, USE ONE SYRINGE ONCE DAILY AS DIRECTED, Disp: 100 each, Rfl: 11 .  Blood Glucose Monitoring Suppl (ONE TOUCH ULTRA MINI) w/Device KIT, Check blood sugar TID  before meals. Diabetes Mellitus Type 2. E11.9, Disp: 1 each, Rfl: 0 .  carvedilol (COREG) 25 MG tablet, TAKE ONE TABLET BY MOUTH TWICE A DAY WITH MEALS, Disp: 180 tablet, Rfl: 1 .  ferrous sulfate 325 (65 FE) MG tablet, Take 325 mg by mouth daily with breakfast., Disp: , Rfl:  .  fluticasone (FLONASE) 50 MCG/ACT nasal spray, Place 2 sprays into the nose daily as needed. For allergies (Patient not taking: Reported on 11/13/2018), Disp: 16 g, Rfl: 6 .  Garlic 8338 MG CAPS, Take 1,000 mg by mouth daily., Disp: , Rfl:  .  glucose blood (ONE TOUCH ULTRA TEST) test strip, USE TO TEST BLOOD SUGAR 3 TIMES DAILY,  BEFORE MEALS, Disp: 100 each, Rfl: 4 .  isosorbide mononitrate (IMDUR) 60 MG 24 hr tablet, TAKE ONE TABLET BY MOUTH DAILY, Disp: 90 tablet, Rfl: 1 .  LANTUS 100 UNIT/ML injection, INJECT 25 UNITS INTO THE SKIN EVERY MORNING *HOLD IF GLUCOSE LEVEL IS LESS THAN 70*, Disp: 30 mL, Rfl: 1 .  lisinopril-hydrochlorothiazide (PRINZIDE,ZESTORETIC) 20-12.5 MG tablet, TAKE TWO TABLETS BY MOUTH DAILY, Disp: 180 tablet, Rfl: 1 .  loratadine (CLARITIN) 10 MG tablet, Take 10 mg by mouth daily as needed for allergies. , Disp: , Rfl:  .  metFORMIN (GLUCOPHAGE-XR) 500 MG 24 hr tablet, TAKE TWO TABLETS BY MOUTH TWICE A DAY, Disp: 360 tablet, Rfl: 2 .  Multiple Vitamin (MULTIVITAMIN WITH MINERALS) TABS tablet, Take 1 tablet by mouth daily. Reported on 04/02/2016, Disp: , Rfl:  .  nitroGLYCERIN (NITROSTAT) 0.4 MG SL tablet, Place 1 tablet (0.4 mg total) under the tongue every 5 (five) minutes as needed for chest pain. (Patient not taking: Reported on 03/05/2018), Disp: 25 tablet, Rfl: 3 .  ONETOUCH DELICA LANCETS 25K MISC, Check blood sugar TID  before meals. Diabetes Mellitus Type 2. E11.9, Disp: 100 each, Rfl: 4  Allergies Codeine; Sulfamethoxazole-trimethoprim; and Tramadol  Review of Systems Review of Systems - Oncology ROS negative   Physical Exam  Vitals Wt Readings from Last 3 Encounters:  02/25/19 158 lb 9.6 oz (71.9 kg)  02/09/19 158 lb 6 oz (71.8 kg)  01/19/19 159 lb (72.1 kg)   Temp Readings from Last 3 Encounters:  02/25/19 98.6 F (37 C) (Oral)  02/09/19 98.8 F (37.1 C) (Oral)  01/19/19 98.8 F (37.1 C) (Oral)   BP Readings from Last 3 Encounters:  02/25/19 (!) 174/60  02/09/19 (!) 134/52  01/19/19 134/60   Pulse Readings from Last 3 Encounters:  02/25/19 77  02/09/19 73  01/19/19 74   Constitutional: Well-developed, well-nourished, and in no distress.   HENT: Head: Normocephalic and atraumatic.  Mouth/Throat: No oropharyngeal exudate. Mucosa moist. Eyes: Pupils are equal,  round, and reactive to light. Conjunctivae are normal. No scleral icterus.  Neck: Normal range of motion. Neck supple. No JVD present.  Cardiovascular: Normal rate, regular rhythm and normal heart sounds.  Exam reveals no gallop and no friction rub.   No murmur heard. Pulmonary/Chest: Effort normal and breath sounds normal. No respiratory distress. No  wheezes.No rales.  Abdominal: Soft. Bowel sounds are normal. No distension. There is no tenderness. There is no guarding.  Musculoskeletal: No edema or tenderness.  Lymphadenopathy: No cervical,axillary or supraclavicular adenopathy.  Neurological: Alert and oriented to person, place, and time. No cranial nerve deficit.  Skin: Skin is warm and dry. No rash noted. No erythema. No pallor.  Psychiatric: Affect and judgment normal.   Labs No visits with results within 3 Day(s) from this visit.  Latest known visit with results is:  Office Visit on 02/09/2019  Component Date Value Ref Range Status  . Total Iron Binding Capacity 02/09/2019 343  250 - 450 ug/dL Final  . UIBC 02/09/2019 273  118 - 369 ug/dL Final  . Iron 02/09/2019 70  27 - 139 ug/dL Final  . Iron Saturation 02/09/2019 20  15 - 55 % Final  . Ferritin 02/09/2019 23  15 - 150 ng/mL Final  . Vitamin B-12 02/09/2019 567  232 - 1,245 pg/mL Final  . Folate 02/09/2019 >20.0  >3.0 ng/mL Final   Comment: A serum folate concentration of less than 3.1 ng/mL is considered to represent clinical deficiency.   . WBC 02/09/2019 5.3  3.4 - 10.8 x10E3/uL Final  . RBC 02/09/2019 3.48* 3.77 - 5.28 x10E6/uL Final  . Hemoglobin 02/09/2019 9.6* 11.1 - 15.9 g/dL Final  . Hematocrit 02/09/2019 28.9* 34.0 - 46.6 % Final  . MCV 02/09/2019 83  79 - 97 fL Final  . MCH 02/09/2019 27.6  26.6 - 33.0 pg Final  . MCHC 02/09/2019 33.2  31.5 - 35.7 g/dL Final  . RDW 02/09/2019 14.3  11.7 - 15.4 % Final  . Platelets 02/09/2019 132* 150 - 450 x10E3/uL Final  . Neutrophils 02/09/2019 43  Not Estab. % Final  .  Lymphs 02/09/2019 43  Not Estab. % Final  . Monocytes 02/09/2019 10  Not Estab. % Final  . Eos 02/09/2019 3  Not Estab. % Final  . Basos 02/09/2019 1  Not Estab. % Final  . Neutrophils Absolute 02/09/2019 2.3  1.4 - 7.0 x10E3/uL Final  . Lymphocytes Absolute 02/09/2019 2.3  0.7 - 3.1 x10E3/uL Final  . Monocytes Absolute 02/09/2019 0.5  0.1 - 0.9 x10E3/uL Final  . EOS (ABSOLUTE) 02/09/2019 0.2  0.0 - 0.4 x10E3/uL Final  . Basophils Absolute 02/09/2019 0.1  0.0 - 0.2 x10E3/uL Final  . Immature Granulocytes 02/09/2019 0  Not Estab. % Final  . Immature Grans (Abs) 02/09/2019 0.0  0.0 - 0.1 x10E3/uL Final  . Retic Ct Pct 02/09/2019 1.1  0.6 - 2.6 % Final  . Path Rev WBC 02/09/2019 Comment   Final   Absolute WBC subset counts are normal.  . Path Rev RBC 02/09/2019 Comment*  Final   Anemia due to erythropenia. Recovery effort not seen.  . Path Rev PLTs 02/09/2019 Comment*  Final   Thrombocytopenia. Morphology normal. Cause not evident.  [name] PATHOLOGIST NAME 02/09/2019 Comment   Final   Reviewed by:  Delorse Lek, MD  Pathologist  . WBC 02/09/2019 5.3  3.4 - 10.8 x10E3/uL Final  . RBC 02/09/2019 3.51* 3.77 - 5.28 x10E6/uL Final  . Hemoglobin 02/09/2019 9.7* 11.1 - 15.9 g/dL Final  . Hematocrit 02/09/2019 31.9* 34.0 - 46.6 % Final  . MCV 02/09/2019 91  79 - 97 fL Final  . MCH 02/09/2019 27.6  26.6 - 33.0 pg Final  . MCHC 02/09/2019 30.4* 31.5 - 35.7 g/dL Final  . RDW 02/09/2019 14.4  11.7 - 15.4 % Final  . Platelets  02/09/2019 142* 150 - 450 x10E3/uL Final  . Neutrophils 02/09/2019 43  Not Estab. % Final  . Lymphs 02/09/2019 43  Not Estab. % Final  . Monocytes 02/09/2019 9  Not Estab. % Final  . Eos 02/09/2019 3  Not Estab. % Final  . Basos 02/09/2019 1  Not Estab. % Final  . Neutrophils Absolute 02/09/2019 2.3  1.4 - 7.0 x10E3/uL Final  . Lymphocytes Absolute 02/09/2019 2.3  0.7 - 3.1 x10E3/uL Final  . Monocytes Absolute 02/09/2019 0.5  0.1 - 0.9 x10E3/uL Final  . EOS (ABSOLUTE) 02/09/2019  0.2  0.0 - 0.4 x10E3/uL Final  . Basophils Absolute 02/09/2019 0.1  0.0 - 0.2 x10E3/uL Final  . Immature Granulocytes 02/09/2019 1  Not Estab. % Final  . Immature Grans (Abs) 02/09/2019 0.0  0.0 - 0.1 x10E3/uL Final  . Hematology Comments: 02/09/2019 Note:   Final   Verified by microscopic examination.  [name] specimen status report 02/09/2019 Comment   Final   Comment: Written Authorization Written Authorization Written Authorization Received. Authorization received from Andrena Mews 02-11-2019 Logged by Marchelle Gearing      Pathology Orders Placed This Encounter  Procedures  . CBC with Differential/Platelet    Standing Status:   Future    Standing Expiration Date:   02/25/2020  . Comprehensive metabolic panel    Standing Status:   Future    Standing Expiration Date:   02/25/2020  . Ferritin    Standing Status:   Future    Standing Expiration Date:   02/25/2020  . Lactate dehydrogenase    Standing Status:   Future    Standing Expiration Date:   02/25/2020  . Protein electrophoresis, serum    Standing Status:   Future    Standing Expiration Date:   02/25/2020  . Hemoglobinopathy evaluation    Standing Status:   Future    Standing Expiration Date:   02/25/2020  . Vitamin B12    Standing Status:   Future    Standing Expiration Date:   02/25/2020  . Folate    Standing Status:   Future    Standing Expiration Date:   02/25/2020  . Methylmalonic acid, serum    Standing Status:   Future    Standing Expiration Date:   02/25/2020  . Haptoglobin    Standing Status:   Future    Standing Expiration Date:   02/25/2020  . Iron and TIBC    Standing Status:   Future    Standing Expiration Date:   02/25/2020  . Transferrin Saturation    Standing Status:   Future    Standing Expiration Date:   02/25/2020       Zoila Shutter MD

## 2019-03-03 ENCOUNTER — Other Ambulatory Visit: Payer: Self-pay | Admitting: Family Medicine

## 2019-03-04 ENCOUNTER — Other Ambulatory Visit: Payer: Self-pay

## 2019-03-04 ENCOUNTER — Inpatient Hospital Stay: Payer: Medicare Other

## 2019-03-04 VITALS — BP 149/56 | HR 73 | Temp 99.0°F | Resp 18

## 2019-03-04 DIAGNOSIS — D508 Other iron deficiency anemias: Secondary | ICD-10-CM

## 2019-03-04 DIAGNOSIS — D509 Iron deficiency anemia, unspecified: Secondary | ICD-10-CM | POA: Diagnosis not present

## 2019-03-04 MED ORDER — SODIUM CHLORIDE 0.9 % IV SOLN
510.0000 mg | Freq: Once | INTRAVENOUS | Status: AC
  Administered 2019-03-04: 510 mg via INTRAVENOUS
  Filled 2019-03-04: qty 17

## 2019-03-04 MED ORDER — SODIUM CHLORIDE 0.9 % IV SOLN
Freq: Once | INTRAVENOUS | Status: AC
  Administered 2019-03-04: 15:00:00 via INTRAVENOUS
  Filled 2019-03-04: qty 250

## 2019-03-04 NOTE — Patient Instructions (Signed)

## 2019-03-11 ENCOUNTER — Other Ambulatory Visit: Payer: Self-pay

## 2019-03-11 ENCOUNTER — Inpatient Hospital Stay: Payer: Medicare Other

## 2019-03-11 VITALS — BP 128/53 | HR 74 | Temp 98.6°F | Resp 18

## 2019-03-11 DIAGNOSIS — D508 Other iron deficiency anemias: Secondary | ICD-10-CM

## 2019-03-11 DIAGNOSIS — D509 Iron deficiency anemia, unspecified: Secondary | ICD-10-CM | POA: Diagnosis not present

## 2019-03-11 MED ORDER — SODIUM CHLORIDE 0.9 % IV SOLN
510.0000 mg | Freq: Once | INTRAVENOUS | Status: AC
  Administered 2019-03-11: 510 mg via INTRAVENOUS
  Filled 2019-03-11: qty 17

## 2019-03-11 MED ORDER — SODIUM CHLORIDE 0.9 % IV SOLN
Freq: Once | INTRAVENOUS | Status: AC
  Administered 2019-03-11: 12:00:00 via INTRAVENOUS
  Filled 2019-03-11: qty 250

## 2019-03-11 NOTE — Patient Instructions (Signed)

## 2019-03-12 ENCOUNTER — Telehealth (INDEPENDENT_AMBULATORY_CARE_PROVIDER_SITE_OTHER): Payer: Medicare Other | Admitting: Family Medicine

## 2019-03-12 DIAGNOSIS — J069 Acute upper respiratory infection, unspecified: Secondary | ICD-10-CM

## 2019-03-12 DIAGNOSIS — B9789 Other viral agents as the cause of diseases classified elsewhere: Secondary | ICD-10-CM

## 2019-03-12 NOTE — Telephone Encounter (Signed)
[facility] Visit  Patient consented to have visit conducted via telephone.  Encounter participants: Patient: [name]  Provider: Caroline More  Others (if applicable): None  Chief Complaint: concern of flu  HPI: Concern for flu Patient calling for concern of flu.  States that yesterday she began to have a cough and then started to have chills.  Her chills have resolved but she is continues to have cough.  No fevers, no body aches.  Her chest does become sore only after increased coughing.  No vomiting or diarrhea.  No shortness of breath.  No recent travel or exposure to others that were sick.  Patient did go to [facility] yesterday for an iron infusion, but otherwise no exposure to people.  Patient was just worried she is starting to get the flu because she had chills yesterday.  ROS: See HPI, otherwise negative review of systems  Pertinent PMHx: Anemia, allergies   Assessment/Plan:  Viral URI with cough Patient with symptoms consistent of viral URI.  Likely patient does not have fever or body aches.  Low concern for flu given no symptoms of fever body aches. Per chart review patient is up-to-date on flu vaccine and pneumonia vaccine.  Low concern for COVID-19 at this time has patient only has cough without fever or exposure to sick contacts or recent travel.  Patient allowed for telephone visit to avoid coming into the office.  Tricked return precautions given.  Advised that if she develops a fever or body aches to please call back.  At that time we can reevaluate for flu vs. COVID-19.  No past medical history of COPD which is reassuring.  Patient also has no shortness of breath.   Note routed to preceptor, Dr. Ardelia Mems  Time spent on phone with patient: 12 minutes

## 2019-03-12 NOTE — Assessment & Plan Note (Signed)
Patient with symptoms consistent of viral URI.  Likely patient does not have fever or body aches.  Low concern for flu given no symptoms of fever body aches. Per chart review patient is up-to-date on flu vaccine and pneumonia vaccine.  Low concern for COVID-19 at this time has patient only has cough without fever or exposure to sick contacts or recent travel.  Patient allowed for telephone visit to avoid coming into the office.  Tricked return precautions given.  Advised that if she develops a fever or body aches to please call back.  At that time we can reevaluate for flu vs. COVID-19.  No past medical history of COPD which is reassuring.  Patient also has no shortness of breath.

## 2019-03-15 ENCOUNTER — Telehealth: Payer: Self-pay

## 2019-03-15 ENCOUNTER — Telehealth: Payer: Self-pay | Admitting: Family Medicine

## 2019-03-15 NOTE — Telephone Encounter (Signed)
I spoke with the patient. She stated that her URI is getting better. She does not have a fever or SOB. She is concern about the fact that the provider who spoke with her mentioned that her Insurance will be charged for the visit.  I explained our new Televisit policy with her. All questions were answered. Return precaution discussed. She currently feels better with her symptoms.

## 2019-03-15 NOTE — Telephone Encounter (Signed)
Spoke with her

## 2019-03-15 NOTE — Telephone Encounter (Signed)
Pt called nurse line. Pt would like to talk to Dr. Gwendlyn Deutscher. Pt spoke to another Dr. On Friday but feels like they just rushed her off the phone. Pt would like Dr. Gwendlyn Deutscher to call her. Pt said she onlly wants to talk to her Dr. [phone]. Ottis Stain, CMA

## 2019-04-05 ENCOUNTER — Other Ambulatory Visit: Payer: Self-pay | Admitting: Family Medicine

## 2019-04-29 ENCOUNTER — Inpatient Hospital Stay: Payer: Medicare Other | Attending: Internal Medicine

## 2019-04-29 ENCOUNTER — Other Ambulatory Visit: Payer: Self-pay

## 2019-04-29 DIAGNOSIS — I10 Essential (primary) hypertension: Secondary | ICD-10-CM | POA: Diagnosis not present

## 2019-04-29 DIAGNOSIS — D509 Iron deficiency anemia, unspecified: Secondary | ICD-10-CM | POA: Diagnosis not present

## 2019-04-29 DIAGNOSIS — D696 Thrombocytopenia, unspecified: Secondary | ICD-10-CM | POA: Diagnosis not present

## 2019-04-29 DIAGNOSIS — Z87891 Personal history of nicotine dependence: Secondary | ICD-10-CM | POA: Diagnosis not present

## 2019-04-29 DIAGNOSIS — Z803 Family history of malignant neoplasm of breast: Secondary | ICD-10-CM | POA: Insufficient documentation

## 2019-04-29 DIAGNOSIS — D563 Thalassemia minor: Secondary | ICD-10-CM | POA: Insufficient documentation

## 2019-04-29 DIAGNOSIS — D508 Other iron deficiency anemias: Secondary | ICD-10-CM

## 2019-04-29 LAB — CBC WITH DIFFERENTIAL/PLATELET
Abs Immature Granulocytes: 0.01 10*3/uL (ref 0.00–0.07)
Basophils Absolute: 0.1 10*3/uL (ref 0.0–0.1)
Basophils Relative: 1 %
Eosinophils Absolute: 0.2 10*3/uL (ref 0.0–0.5)
Eosinophils Relative: 4 %
HCT: 30.8 % — ABNORMAL LOW (ref 36.0–46.0)
Hemoglobin: 9.4 g/dL — ABNORMAL LOW (ref 12.0–15.0)
Immature Granulocytes: 0 %
Lymphocytes Relative: 39 %
Lymphs Abs: 1.8 10*3/uL (ref 0.7–4.0)
MCH: 28.1 pg (ref 26.0–34.0)
MCHC: 30.5 g/dL (ref 30.0–36.0)
MCV: 92.2 fL (ref 80.0–100.0)
Monocytes Absolute: 0.6 10*3/uL (ref 0.1–1.0)
Monocytes Relative: 13 %
Neutro Abs: 2 10*3/uL (ref 1.7–7.7)
Neutrophils Relative %: 43 %
Platelets: 146 10*3/uL — ABNORMAL LOW (ref 150–400)
RBC: 3.34 MIL/uL — ABNORMAL LOW (ref 3.87–5.11)
RDW: 16.4 % — ABNORMAL HIGH (ref 11.5–15.5)
WBC: 4.6 10*3/uL (ref 4.0–10.5)
nRBC: 0 % (ref 0.0–0.2)

## 2019-04-29 LAB — COMPREHENSIVE METABOLIC PANEL
ALT: 23 U/L (ref 0–44)
AST: 21 U/L (ref 15–41)
Albumin: 3.6 g/dL (ref 3.5–5.0)
Alkaline Phosphatase: 78 U/L (ref 38–126)
Anion gap: 8 (ref 5–15)
BUN: 11 mg/dL (ref 8–23)
CO2: 32 mmol/L (ref 22–32)
Calcium: 9.6 mg/dL (ref 8.9–10.3)
Chloride: 102 mmol/L (ref 98–111)
Creatinine, Ser: 0.89 mg/dL (ref 0.44–1.00)
GFR calc Af Amer: >60 mL/min (ref >60)
GFR calc non Af Amer: >60 mL/min (ref >60)
Glucose, Bld: 135 mg/dL — ABNORMAL HIGH (ref 70–99)
Potassium: 3.7 mmol/L (ref 3.5–5.1)
Sodium: 142 mmol/L (ref 135–145)
Total Bilirubin: 0.6 mg/dL (ref 0.3–1.2)
Total Protein: 7.1 g/dL (ref 6.5–8.1)

## 2019-04-29 LAB — IRON AND TIBC
Iron: 98 ug/dL (ref 41–142)
Saturation Ratios: 41 % (ref 21–57)
TIBC: 238 ug/dL (ref 236–444)
UIBC: 140 ug/dL (ref 120–384)

## 2019-04-29 LAB — VITAMIN B12: Vitamin B-12: 396 pg/mL (ref 180–914)

## 2019-04-29 LAB — FOLATE: Folate: 59.4 ng/mL (ref >5.9)

## 2019-04-29 LAB — FERRITIN: Ferritin: 216 ng/mL (ref 11–307)

## 2019-04-29 LAB — LACTATE DEHYDROGENASE: LDH: 175 U/L (ref 98–192)

## 2019-04-30 LAB — PROTEIN ELECTROPHORESIS, SERUM
A/G Ratio: 1.2 (ref 0.7–1.7)
Albumin ELP: 3.5 g/dL (ref 2.9–4.4)
Alpha-1-Globulin: 0.2 g/dL (ref 0.0–0.4)
Alpha-2-Globulin: 1 g/dL (ref 0.4–1.0)
Beta Globulin: 0.9 g/dL (ref 0.7–1.3)
Gamma Globulin: 0.9 g/dL (ref 0.4–1.8)
Globulin, Total: 3 g/dL (ref 2.2–3.9)
Total Protein ELP: 6.5 g/dL (ref 6.0–8.5)

## 2019-04-30 LAB — METHYLMALONIC ACID, SERUM: Methylmalonic Acid, Quantitative: 159 nmol/L (ref 0–378)

## 2019-04-30 LAB — HAPTOGLOBIN: Haptoglobin: 292 mg/dL (ref 42–346)

## 2019-05-01 ENCOUNTER — Other Ambulatory Visit: Payer: Self-pay | Admitting: Family Medicine

## 2019-05-03 LAB — HEMOGLOBINOPATHY EVALUATION
Hgb A2 Quant: 1.5 % — ABNORMAL LOW (ref 1.8–3.2)
Hgb A: 95.6 % — ABNORMAL LOW (ref 96.4–98.8)
Hgb C: 0 %
Hgb F Quant: 2.9 % — ABNORMAL HIGH (ref 0.0–2.0)
Hgb S Quant: 0 %
Hgb Variant: 0 %

## 2019-05-06 ENCOUNTER — Inpatient Hospital Stay: Payer: Medicare Other

## 2019-05-06 ENCOUNTER — Other Ambulatory Visit: Payer: Self-pay

## 2019-05-06 ENCOUNTER — Encounter: Payer: Self-pay | Admitting: Internal Medicine

## 2019-05-06 ENCOUNTER — Inpatient Hospital Stay: Payer: Medicare Other | Admitting: Internal Medicine

## 2019-05-06 VITALS — BP 188/65 | HR 78 | Temp 98.5°F | Resp 18 | Ht 63.0 in | Wt 157.6 lb

## 2019-05-06 DIAGNOSIS — D509 Iron deficiency anemia, unspecified: Secondary | ICD-10-CM

## 2019-05-06 DIAGNOSIS — I10 Essential (primary) hypertension: Secondary | ICD-10-CM

## 2019-05-06 DIAGNOSIS — Z803 Family history of malignant neoplasm of breast: Secondary | ICD-10-CM | POA: Diagnosis not present

## 2019-05-06 DIAGNOSIS — D508 Other iron deficiency anemias: Secondary | ICD-10-CM

## 2019-05-06 DIAGNOSIS — Z87891 Personal history of nicotine dependence: Secondary | ICD-10-CM

## 2019-05-06 DIAGNOSIS — D696 Thrombocytopenia, unspecified: Secondary | ICD-10-CM

## 2019-05-06 NOTE — Progress Notes (Signed)
Diagnosis Other iron deficiency anemia - Plan: Alpha-Thalassemia GenotypR, Ambulatory referral to Gastroenterology  Staging Cancer Staging No matching staging information was found for the patient.  Assessment and Plan:   1.  Iron deficiency anemia. 78 year old patient referred for consultation due to anemia.  She reports she has been on iron tablets for quite some time.  The patient had blood work done January 19, 2019 which showed a white count 4.5 hemoglobin 9.3 platelets 126,000.  Blood counts done February 09, 2019 showed a white count 5.3 hemoglobin 9.6 platelets 132,000.  She has a normal differential.  Ferritin was decreased at 23.  Folate was normal B12 was normal.  The patient reports history of diverticulitis.  She also reports history of hemorrhoids.  Last colonoscopy was 07/02/2013 and showed diverticuli as well as hemorrhoids.  Patient denies any craving of ice or starch.    Ferritin level was decreased at 23 which is consistent with iron deficiency anemia.  She has reportedly been taking iron tablets but has an intolerance for oral iron.  She was recommended for [facility] and was treated 03/04/2019 and 03/11/2019.    Labs done 04/29/2019 reviewed and showed WBC 4.6 HB 9.4 plts 146,000.  Chemistries showed K+ 3.7 Cr 0.89 and normal LFTs.  Ferritin is improved at 216.  Pt has normal haptoglobin, SPEP, B12 and folate.  HB electrophoresis shows elevation of Hgb F which may indicate the presence of a hereditary persistence gene. Low Hgb A2 may indicate an alpha-thalassemia or iron deficiency.  Will check thalassemia genotype study for further evaluation. Pt will have phone follow-up in 2 weeks to go over results.  Due to IDA she is referred to [facility] for evaluation.    2.  Thrombocytopenia.  Platelet count is adequate at 146,000.  She has a normal differential.    3.  Hypertension.  Blood pressure is 188/65.  She should follow-up with primary care physician.  4.  Health maintenance.  Patient  had a mammogram December 2019 that was negative.  Continue screening mammogram as recommended.  Patient will be referred to GI for evaluation due to iron deficiency anemia.  25  minutes spent with more than 50% in review of records, counseling and coordination of care.  Interval history:  Historical data obtained from note dated 02/25/2019.  78 year old patient referred for consultation due to anemia.  She reports she has been on iron tablets for quite some time.  The patient had blood work done January 19, 2019 which showed a white count 4.5 hemoglobin 9.3 platelets 126,000.  Blood counts done February 09, 2019 showed a white count 5.3 hemoglobin 9.6 platelets 132,000.  She has a normal differential.  Ferritin was decreased at 23.  Folate was normal B12 was normal.  The patient reports history of diverticulitis.  She also reports history of hemorrhoids.  Last colonoscopy was 07/02/2013 and showed diverticuli as well as hemorrhoids.  Patient denies any craving of ice or starch.    Current Status:  Pt is seen today for follow-up to go over labs.    Problem List Patient Active Problem List   Diagnosis Date Noted  . Iron deficiency anemia [D50.9] 02/25/2019  . Thrombocytopenia ([place]) [D69.6] 02/09/2019  . Dysphagia [R13.10] 11/13/2018  . Muscle spasm of back [M62.830] 09/12/2018  . Low back pain [M54.5] 06/26/2018  . Subscapular pain, left [M25.512] 08/29/2017  . 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 [Z95.5] 08/29/2017  . Nodule of right lung [R91.1] 08/29/2017  . Aortic  arch atherosclerosis ([place]) [I70.0] 08/29/2017  . Liver cyst [K76.89]   . Thickened nail [L60.2] 03/01/2016  . IBS (irritable bowel syndrome) [K58.9] 12/27/2014  . Left thyroid nodule [E04.1] 07/27/2014  . Thyroid enlargement [E04.9] 04/12/2013  . History of thyroid cancer [Z85.850] 04/12/2013  . Viral URI with cough [J06.9, B97.89] 03/12/2012  . Hypothyroidism [E03.9] 02/19/2007  . Diabetes mellitus type 2,  controlled ([place]) [E11.9] 02/19/2007  . HYPERCHOLESTEROLEMIA [E78.00] 02/19/2007  . ANEMIA, OTHER, UNSPECIFIED [D64.9] 02/19/2007  . CARPAL TUNNEL SYNDROME [G56.00] 02/19/2007  . HYPERTENSION, BENIGN SYSTEMIC [I10] 02/19/2007  . Coronary atherosclerosis [I25.10] 02/19/2007  . 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 Anemia   . Aortic arch atherosclerosis ([place]) 08/29/2017  . Atypical chest pain 11/25/2011  . Cor pulmonale ([place])   . Coronary artery disease    a. Remote PTCA of the PDA;  b. 02/2009 PCI/CBA to the D1;  c. 11/2011 Cath: LM nl, LAD 20p, 75m D1 patent, RI 20p, LCX nl, RCA 30d, EF 55-65%.  . CTS (carpal tunnel syndrome)   . Diabetes mellitus, type II ([place]   . ETD (eustachian tube dysfunction)   . Gastritis   . GERD (gastroesophageal reflux disease)   . 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 History of thyroid cancer   . Hypercholesteremia   . Hypertension, benign   . Hypothyroid   . Iron deficiency anemia 02/25/2019  . Myocardial infarction ([place]   . Nodule of right lung 08/29/2017   3 mm stable nodule right upper lobe on CTA Chest 08/28/17  . Postmenopausal   . 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 Subscapular pain, left 08/29/2017    Past Surgical History Past Surgical History:  Procedure Laterality Date  . ANGIOPLASTY     PDA 90-40 %  . BARTHOLIN GLAND CYST EXCISION  09-13-2004   Keratosis- no CA  . BREAST BIOPSY     LEFT SPOT REMOVED  . BREAST EXCISIONAL BIOPSY Left   . CARDIAC CATHETERIZATION  03/15/2009   stenosis of diagonal branch of LAD- PTCA performed  . CHOLECYSTECTOMY  08-23-2002  . CORONARY ANGIOPLASTY    . ENDOMETRIAL BIOPSY  02-20-1998  . LEFT HEART CATHETERIZATION WITH CORONARY ANGIOGRAM N/A 11/26/2011   Procedure: LEFT HEART CATHETERIZATION WITH CORONARY ANGIOGRAM;  Surgeon: Peter M JMartinique MD;  [facility];  Service:  Cardiovascular;  Laterality: N/A;  . PTCA  10/23/2000 and 03/15/09  . 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 TONSILLECTOMY    . TUBAL LIGATION      Family History Family History  Problem Relation Age of Onset  . Diabetes Mother        Deceased- hypoglycemic coma   . Hypertension Mother   . Other Father        unaware of his health history - died when pt was 78yr old.  .Marland KitchenHeart attack Sister   . Diabetes Mellitus II Brother   . Heart attack Brother   . Heart attack Brother   . Diabetes Mellitus II Brother   . Epilepsy Brother   . Breast cancer Daughter      Social History  reports that she quit smoking about 38 years ago. Her smoking use included cigarettes. She quit smokeless tobacco use about 24 years ago.  Her smokeless tobacco use included chew. She reports that she does not  drink alcohol or use drugs.  Medications  Current Outpatient Medications:  .  amLODipine (NORVASC) 10 MG tablet, TAKE ONE TABLET BY MOUTH DAILY (Patient taking differently: Take 10 mg by mouth once a day), Disp: 90 tablet, Rfl: 1 .  aspirin 81 MG chewable tablet, Chew 81 mg by mouth daily.  , Disp: , Rfl:  .  atorvastatin (LIPITOR) 20 MG tablet, TAKE ONE TABLET BY MOUTH DAILY AT 6PM, Disp: 90 tablet, Rfl: 3 .  BD VEO INSULIN SYRINGE U/F 31G X 15/64" 1 ML MISC, USE ONE SYRINGE ONCE DAILY AS DIRECTED, Disp: 100 each, Rfl: 11 .  Blood Glucose Monitoring Suppl (ONE TOUCH ULTRA MINI) w/Device KIT, Check blood sugar TID  before meals. Diabetes Mellitus Type 2. E11.9, Disp: 1 each, Rfl: 0 .  carvedilol (COREG) 25 MG tablet, TAKE ONE TABLET BY MOUTH TWICE A DAY WITH MEALS, Disp: 180 tablet, Rfl: 1 .  ferrous sulfate 325 (65 FE) MG tablet, Take 325 mg by mouth daily with breakfast., Disp: , Rfl:  .  fluticasone (FLONASE) 50 MCG/ACT nasal spray, Place 2 sprays into the nose daily as needed. For allergies (Patient not taking: Reported on 11/13/2018), Disp: 16 g, Rfl: 6 .  Garlic 3875 MG  CAPS, Take 1,000 mg by mouth daily., Disp: , Rfl:  .  glucose blood (ONE TOUCH ULTRA TEST) test strip, USE TO TEST BLOOD SUGAR 3 TIMES DAILY, BEFORE MEALS, Disp: 100 each, Rfl: 4 .  isosorbide mononitrate (IMDUR) 60 MG 24 hr tablet, TAKE ONE TABLET BY MOUTH DAILY, Disp: 90 tablet, Rfl: 1 .  LANTUS 100 UNIT/ML injection, INJECT 25 UNITS INTO THE SKIN EVERY MORNING *HOLD IF GLUCOSE LEVEL IS LESS THAN 70*, Disp: 30 mL, Rfl: 1 .  lisinopril-hydrochlorothiazide (PRINZIDE,ZESTORETIC) 20-12.5 MG tablet, TAKE TWO TABLETS BY MOUTH DAILY, Disp: 180 tablet, Rfl: 2 .  loratadine (CLARITIN) 10 MG tablet, Take 10 mg by mouth daily as needed for allergies. , Disp: , Rfl:  .  metFORMIN (GLUCOPHAGE-XR) 500 MG 24 hr tablet, TAKE TWO TABLETS BY MOUTH TWICE A DAY, Disp: 360 tablet, Rfl: 1 .  Multiple Vitamin (MULTIVITAMIN WITH MINERALS) TABS tablet, Take 1 tablet by mouth daily. Reported on 04/02/2016, Disp: , Rfl:  .  nitroGLYCERIN (NITROSTAT) 0.4 MG SL tablet, Place 1 tablet (0.4 mg total) under the tongue every 5 (five) minutes as needed for chest pain. (Patient not taking: Reported on 03/05/2018), Disp: 25 tablet, Rfl: 3 .  ONETOUCH DELICA LANCETS 64P MISC, Check blood sugar TID  before meals. Diabetes Mellitus Type 2. E11.9, Disp: 100 each, Rfl: 4  Allergies Codeine; Sulfamethoxazole-trimethoprim; and Tramadol  Review of Systems Review of Systems - Oncology ROS negative   Physical Exam  Vitals Wt Readings from Last 3 Encounters:  05/06/19 157 lb 9.6 oz (71.5 kg)  02/25/19 158 lb 9.6 oz (71.9 kg)  02/09/19 158 lb 6 oz (71.8 kg)   Temp Readings from Last 3 Encounters:  05/06/19 98.5 F (36.9 C) (Oral)  03/11/19 98.6 F (37 C) (Oral)  03/04/19 99 F (37.2 C) (Oral)   BP Readings from Last 3 Encounters:  05/06/19 (!) 188/65  03/11/19 (!) 128/53  03/04/19 (!) 149/56   Pulse Readings from Last 3 Encounters:  05/06/19 78  03/11/19 74  03/04/19 73   Constitutional: Well-developed, well-nourished,  and in no distress.   HENT: Head: Normocephalic and atraumatic.  Mouth/Throat: No oropharyngeal exudate. Mucosa moist. Eyes: Pupils are equal, round, and reactive to light. Conjunctivae are normal. No scleral icterus.  Neck: Normal range of motion. Neck supple. No JVD present.  Cardiovascular: Normal rate, regular rhythm and normal heart sounds.  Exam reveals no gallop and no friction rub.   No murmur heard. Pulmonary/Chest: Effort normal and breath sounds normal. No respiratory distress. No wheezes.No rales.  Abdominal: Soft. Bowel sounds are normal. No distension. There is no tenderness. There is no guarding.  Musculoskeletal: No edema or tenderness.  Lymphadenopathy: No cervical, axillary or supraclavicular adenopathy.  Neurological: Alert and oriented to person, place, and time. No cranial nerve deficit.  Skin: Skin is warm and dry. No rash noted. No erythema. No pallor.  Psychiatric: Affect and judgment normal.   Labs No visits with results within 3 Day(s) from this visit.  Latest known visit with results is:  Appointment on 04/29/2019  Component Date Value Ref Range Status  . Iron 04/29/2019 98  41 - 142 ug/dL Final  . TIBC 04/29/2019 238  236 - 444 ug/dL Final  . Saturation Ratios 04/29/2019 41  21 - 57 % Final  . UIBC 04/29/2019 140  120 - 384 ug/dL Final   Performed at [facility], [place] [address], [place], [place] [address]  . Haptoglobin 04/29/2019 292  42 - 346 mg/dL Final   Comment: (NOTE) Performed At: [facility] [place], [place] [id number] Rush Farmer MD [id number]   . Methylmalonic Acid, Quantitative 04/29/2019 159  0 - 378 nmol/L Final  . Disclaimer: 04/29/2019 Comment   Final   Comment: (NOTE) This test was developed and its performance characteristics determined by [facility]. It has not been cleared or approved by the Food and Drug Administration. Performed At: [facility] [place], [place] [id number] Rush Farmer MD [id number]   . Folate 04/29/2019 59.4  >5.9 ng/mL Final   Comment: RESULTS CONFIRMED BY MANUAL DILUTION Performed at [place] [address], [place], [place] [address]   . Vitamin B-12 04/29/2019 396  180 - 914 pg/mL Final   Comment: (NOTE) This assay is not validated for testing neonatal or myeloproliferative syndrome specimens for Vitamin B12 levels. Performed at [facility], [place] [address], [place],  [address]   . Hgb A2 Quant 04/29/2019 1.5* 1.8 - 3.2 % Final  . Hgb F Quant 04/29/2019 2.9* 0.0 - 2.0 % Final  . Hgb S Quant 04/29/2019 0.0  0.0 % Final  . Hgb C 04/29/2019 0.0  0.0 % Corrected  . Hgb A 04/29/2019 95.6* 96.4 - 98.8 % Final  . Hgb Variant 04/29/2019 0.0  0.0 % Corrected  . Please Note: 04/29/2019 Comment   Corrected   Comment: (NOTE) Hemoglobin pattern and concentrations reveal an elevation of Hgb F which may indicate the presence of a hereditary persistence gene. However, elevations of Hgb F have also been reported to occur with certain anemias, pregnancy, porphyrias, and some malignancies. Low Hgb A2 may indicate an alpha-thalassemia or iron deficiency. Suggest clinical and hematologic correlation. Performed At: [facility] [place], [place] [id number] Rush Farmer MD [id number]   . Total Protein ELP 04/29/2019 6.5  6.0 - 8.5 g/dL Final  . Albumin ELP 04/29/2019 3.5  2.9 - 4.4 g/dL Final  . Alpha-1-Globulin 04/29/2019 0.2  0.0 - 0.4 g/dL Final  . Alpha-2-Globulin 04/29/2019 1.0  0.4 - 1.0 g/dL Final  . Beta Globulin 04/29/2019 0.9  0.7 - 1.3 g/dL Final  . Gamma Globulin 04/29/2019 0.9  0.4 - 1.8 g/dL Final  .  M-Spike, % 04/29/2019 Not Observed  Not Observed g/dL Final  . SPE Interp. 04/29/2019 Comment   Final   Comment: (NOTE) The SPE pattern appears unremarkable. Evidence of monoclonal protein is not apparent. Performed At: [facility] [place], [place] [id number] Rush Farmer MD [id number]   . Comment 04/29/2019 Comment   Final   Comment: (NOTE) Protein electrophoresis scan will follow via computer, mail, or courier delivery.   . Globulin, Total 04/29/2019 3.0  2.2 - 3.9 g/dL Corrected  . A/G Ratio 04/29/2019 1.2  0.7 - 1.7 Corrected  . LDH 04/29/2019 175  98 - 192 U/L Final   Performed at [facility], [place] [address] [place], [place] [address]  . Ferritin 04/29/2019 216  11 - 307 ng/mL Final   Performed at [facility], [place] [address] [place], [place] [address]  . Sodium 04/29/2019 142  135 - 145 mmol/L Final  . Potassium 04/29/2019 3.7  3.5 - 5.1 mmol/L Final  . Chloride 04/29/2019 102  98 - 111 mmol/L Final  . CO2 04/29/2019 32  22 - 32 mmol/L Final  . Glucose, Bld 04/29/2019 135* 70 - 99 mg/dL Final  . BUN 04/29/2019 11  8 - 23 mg/dL Final  . Creatinine, Ser 04/29/2019 0.89  0.44 - 1.00 mg/dL Final  . Calcium 04/29/2019 9.6  8.9 - 10.3 mg/dL Final  . Total Protein 04/29/2019 7.1  6.5 - 8.1 g/dL Final  . Albumin 04/29/2019 3.6  3.5 - 5.0 g/dL Final  . AST 04/29/2019 21  15 - 41 U/L Final  . ALT 04/29/2019 23  0 - 44 U/L Final  . Alkaline Phosphatase 04/29/2019 78  38 - 126 U/L Final  . Total Bilirubin 04/29/2019 0.6  0.3 - 1.2 mg/dL Final  . GFR calc non Af Amer 04/29/2019 >60  >60 mL/min Final  . GFR calc Af Amer 04/29/2019 >60  >60 mL/min Final  . Anion gap 04/29/2019 8  5 - 15 Final   Performed at [facility], [place] [address] [place], [place] [address]  . WBC 04/29/2019 4.6  4.0 - 10.5 K/uL Final  . RBC 04/29/2019 3.34* 3.87 - 5.11 MIL/uL Final  . Hemoglobin 04/29/2019 9.4* 12.0 - 15.0 g/dL Final  . HCT 04/29/2019 30.8* 36.0 - 46.0 % Final  . MCV 04/29/2019 92.2  80.0 - 100.0 fL Final  . MCH 04/29/2019 28.1  26.0 - 34.0 pg Final  . MCHC 04/29/2019 30.5  30.0 - 36.0 g/dL Final  . RDW  04/29/2019 16.4* 11.5 - 15.5 % Final  . Platelets 04/29/2019 146* 150 - 400 K/uL Final  . nRBC 04/29/2019 0.0  0.0 - 0.2 % Final  . Neutrophils Relative % 04/29/2019 43  % Final  . Neutro Abs 04/29/2019 2.0  1.7 - 7.7 K/uL Final  . Lymphocytes Relative 04/29/2019 39  % Final  . Lymphs Abs 04/29/2019 1.8  0.7 - 4.0 K/uL Final  . Monocytes Relative 04/29/2019 13  % Final  . Monocytes Absolute 04/29/2019 0.6  0.1 - 1.0 K/uL Final  . Eosinophils Relative 04/29/2019 4  % Final  . Eosinophils Absolute 04/29/2019 0.2  0.0 - 0.5 K/uL Final  . Basophils Relative 04/29/2019 1  % Final  . Basophils Absolute 04/29/2019 0.1  0.0 - 0.1 K/uL Final  . Immature Granulocytes 04/29/2019 0  % Final  . Abs Immature Granulocytes 04/29/2019 0.01  0.00 - 0.07 K/uL Final   Performed  at [facility], [place] [address] [place], [place] [address]     Pathology Orders Placed This Encounter  Procedures  . Alpha-Thalassemia GenotypR    Standing Status:   Future    Number of Occurrences:   1    Standing Expiration Date:   05/05/2020  . Ambulatory referral to Gastroenterology    Referral Priority:   Routine    Referral Type:   Consultation    Referral Reason:   Specialty Services Required    Number of Visits Requested:   1       Zoila Shutter MD

## 2019-05-07 ENCOUNTER — Telehealth: Payer: Self-pay | Admitting: Internal Medicine

## 2019-05-07 NOTE — Telephone Encounter (Signed)
Tried to reach regarding schedule °

## 2019-05-10 ENCOUNTER — Other Ambulatory Visit: Payer: Self-pay | Admitting: Family Medicine

## 2019-05-14 ENCOUNTER — Other Ambulatory Visit: Payer: Self-pay | Admitting: Family Medicine

## 2019-05-14 DIAGNOSIS — Z794 Long term (current) use of insulin: Secondary | ICD-10-CM

## 2019-05-14 DIAGNOSIS — E1169 Type 2 diabetes mellitus with other specified complication: Secondary | ICD-10-CM

## 2019-05-14 LAB — ALPHA-THALASSEMIA GENOTYPR

## 2019-05-15 ENCOUNTER — Other Ambulatory Visit: Payer: Self-pay | Admitting: Family Medicine

## 2019-05-19 ENCOUNTER — Other Ambulatory Visit: Payer: Self-pay

## 2019-05-19 DIAGNOSIS — R7309 Other abnormal glucose: Secondary | ICD-10-CM

## 2019-05-19 MED ORDER — ONETOUCH DELICA LANCETS 33G MISC: 4 refills | Status: DC

## 2019-05-19 MED ORDER — "INSULIN SYRINGE-NEEDLE U-100 31G X 15/64"" 1 ML MISC": 3 refills | Status: DC

## 2019-05-20 ENCOUNTER — Inpatient Hospital Stay (HOSPITAL_BASED_OUTPATIENT_CLINIC_OR_DEPARTMENT_OTHER): Payer: Medicare Other | Admitting: Internal Medicine

## 2019-05-20 DIAGNOSIS — D563 Thalassemia minor: Secondary | ICD-10-CM | POA: Diagnosis not present

## 2019-05-20 DIAGNOSIS — D508 Other iron deficiency anemias: Secondary | ICD-10-CM

## 2019-05-20 NOTE — Progress Notes (Signed)
Virtual Visit via Telephone Note  I connected with [name] on 05/20/19 at  9:10 AM EDT by telephone and verified that I am speaking with the correct person using two identifiers.   I discussed the limitations, risks, security and privacy concerns of performing an evaluation and management service by telephone and the availability of in person appointments. I also discussed with the patient that there may be a patient responsible charge related to this service. The patient expressed understanding and agreed to proceed.  Interval history:  Historical data obtained from note dated 02/25/2019.  78 year old patient referred for consultation due to anemia.  She reports she has been on iron tablets for quite some time.  The patient had blood work done January 19, 2019 which showed a white count 4.5 hemoglobin 9.3 platelets 126,000.  Blood counts done February 09, 2019 showed a white count 5.3 hemoglobin 9.6 platelets 132,000.  She has a normal differential.  Ferritin was decreased at 23.  Folate was normal B12 was normal.  The patient reports history of diverticulitis.  She also reports history of hemorrhoids.  Last colonoscopy was 07/02/2013 and showed diverticuli as well as hemorrhoids.  Patient denies any craving of ice or starch.    Observations/Objective: Discuss labs from 05/06/2019.    Assessment and Plan: 1.  Iron deficiency anemia. 78 year old patient referred for consultation due to anemia.  She reports she has been on iron tablets for quite some time.  The patient had blood work done January 19, 2019 which showed a white count 4.5 hemoglobin 9.3 platelets 126,000.  Blood counts done February 09, 2019 showed a white count 5.3 hemoglobin 9.6 platelets 132,000.  She has a normal differential.  Ferritin was decreased at 23.  Folate was normal B12 was normal.  The patient reports history of diverticulitis.  She also reports history of hemorrhoids.  Last colonoscopy was 07/02/2013 and showed diverticuli  as well as hemorrhoids.  Patient denies any craving of ice or starch.    Ferritin level was decreased at 23 which is consistent with iron deficiency anemia.  She has reportedly been taking iron tablets but has an intolerance for oral iron.  She was [facility] for [facility] and was treated 03/04/2019 and 03/11/2019.    Labs done 04/29/2019 reviewed and showed WBC 4.6 HB 9.4 plts 146,000.  Chemistries showed K+ 3.7 Cr 0.89 and normal LFTs.  Ferritin is improved at 216.  Pt has normal haptoglobin, SPEP, B12 and folate.    HB electrophoresis shows elevation of Hgb F which may indicate the presence of a hereditary persistence gene. Low Hgb A2 may indicate an alpha-thalassemia or iron deficiency.    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    This is likely explaination mild anemia and microcytosis.  This is usually an asymptomatic condition.  Due to IDA Pt should continue follow-up with [facility].    2.  Thrombocytopenia.  Platelet count is adequate at 146,000.  She has a normal differential.    3.  Hypertension.  Blood pressure was 188/65.  She should follow-up with primary care physician.  4.  Health maintenance.  Patient had a mammogram December 2019 that was negative.  Continue screening mammogram as recommended.  Continue GI follow-up as recommended.  Follow Up Instructions: Pt will have labs and follow-up in 09/2019.      I discussed the assessment and treatment plan with the patient. The patient was provided an opportunity to ask questions and all were answered. The patient agreed with the plan and demonstrated an understanding of the instructions.   The patient was advised to call back or seek  an in-person evaluation if the symptoms worsen or if the condition fails to improve as anticipated.  I provided 15 minutes of non-face-to-face time during this encounter.   Zoila Shutter, MD

## 2019-05-21 ENCOUNTER — Telehealth: Payer: Self-pay | Admitting: Internal Medicine

## 2019-05-21 NOTE — Telephone Encounter (Signed)
Will schedule once template is available °

## 2019-06-18 ENCOUNTER — Encounter: Payer: Self-pay | Admitting: Family Medicine

## 2019-06-18 ENCOUNTER — Ambulatory Visit (INDEPENDENT_AMBULATORY_CARE_PROVIDER_SITE_OTHER): Payer: Medicare Other | Admitting: Family Medicine

## 2019-06-18 ENCOUNTER — Other Ambulatory Visit: Payer: Self-pay

## 2019-06-18 VITALS — BP 162/64 | HR 75 | Ht 63.0 in | Wt 158.2 lb

## 2019-06-18 DIAGNOSIS — E1159 Type 2 diabetes mellitus with other circulatory complications: Secondary | ICD-10-CM | POA: Diagnosis not present

## 2019-06-18 DIAGNOSIS — I10 Essential (primary) hypertension: Secondary | ICD-10-CM | POA: Diagnosis not present

## 2019-06-18 DIAGNOSIS — R1310 Dysphagia, unspecified: Secondary | ICD-10-CM | POA: Diagnosis not present

## 2019-06-18 DIAGNOSIS — M47816 Spondylosis without myelopathy or radiculopathy, lumbar region: Secondary | ICD-10-CM | POA: Insufficient documentation

## 2019-06-18 DIAGNOSIS — I252 Old myocardial infarction: Secondary | ICD-10-CM | POA: Insufficient documentation

## 2019-06-18 DIAGNOSIS — D508 Other iron deficiency anemias: Secondary | ICD-10-CM | POA: Diagnosis not present

## 2019-06-18 NOTE — Progress Notes (Signed)
  Subjective:     Patient ID: [name], female   DOB: 15-Oct-1941, 78 y.o.   MRN: [id number]  HPI DM2: She is compliant with Lantus 25 units daily and Metformin 1000 mg BID. Here for f/u. HTN: BP has been running high lately despite compliance with meds. She is currently on Coreg 25 mg BID, Imdur ER 60 mg qd,Zestoretic 20/12.5 mg BID. Dysphagia:Feels better. Per patient she already had GI evaluation and she got EGD done in April.  Anemia:She is compliant with hemonc f/u. Per patient she was started on Feraheme treatment and was told to stop taking oral iron supplements.  Review of Systems  Respiratory: Negative.   Cardiovascular: Negative.   Musculoskeletal: Negative.   Psychiatric/Behavioral: Negative.   All other systems reviewed and are negative.      Objective:   Physical Exam Vitals signs and nursing note reviewed.  Constitutional:      Appearance: She is not ill-appearing.  Neck:     Musculoskeletal: Normal range of motion.  Cardiovascular:     Rate and Rhythm: Normal rate and regular rhythm.     Heart sounds: Normal heart sounds. No murmur.  Pulmonary:     Effort: Pulmonary effort is normal. No respiratory distress.     Breath sounds: Normal breath sounds. No wheezing or rhonchi.  Abdominal:     General: Abdomen is flat.     Palpations: There is no mass.     Tenderness: There is no abdominal tenderness.  Neurological:     Mental Status: She is alert.        Assessment:     DM2: HTN: Dysphagia: Anemia    Plan:     Check problem list.   NB: A1C machine was not working today. Patient was asked to wait and get it done when it started working and I also informed her that I will come back in to discuss her BP. However, when I returned, CMA had let her go home since the A1C machine is still broken.  Plan is to give her a follow-up call to complete HTN and DM recommendation.  See telephone note.

## 2019-06-19 NOTE — Assessment & Plan Note (Signed)
Dr. Wenda Low note reviewed. Looks like she might have some underlying thalassemia. Unclear why she was told to d/c iron. Looks like she will be getting Feraheme infusion instead. I will defer management to H[facility].

## 2019-06-19 NOTE — Assessment & Plan Note (Signed)
BP elevated. It was also elevated at her recent hematology visit. Plan is to increase her Coreg. However, her HR was not documented and she already left by the time I returned to talk to her. I gave her a follow-up call and schedule a nurse appointment to check her BP and HR. I will adjust her regimen then. In the mean time, I advised her to continue home BP check. She agreed with the plan.

## 2019-06-19 NOTE — Assessment & Plan Note (Signed)
SHe is compliant with meds. No hypoglycemic episodes. A1C Machine not working. I gave her a f/u call and schedule nurse and lab appointment for her to get A1C checked. She agreed with the plan.

## 2019-06-19 NOTE — Assessment & Plan Note (Signed)
Improved. Continue GI F/U. She will obtain record for [place].

## 2019-06-21 ENCOUNTER — Other Ambulatory Visit: Payer: Medicare Other

## 2019-06-21 ENCOUNTER — Ambulatory Visit (INDEPENDENT_AMBULATORY_CARE_PROVIDER_SITE_OTHER): Payer: Medicare Other | Admitting: *Deleted

## 2019-06-21 ENCOUNTER — Other Ambulatory Visit: Payer: Self-pay

## 2019-06-21 ENCOUNTER — Telehealth: Payer: Self-pay | Admitting: Family Medicine

## 2019-06-21 VITALS — BP 142/60 | HR 71

## 2019-06-21 DIAGNOSIS — E1159 Type 2 diabetes mellitus with other circulatory complications: Secondary | ICD-10-CM | POA: Diagnosis not present

## 2019-06-21 LAB — POCT GLYCOSYLATED HEMOGLOBIN (HGB A1C): HbA1c, POC (controlled diabetic range): 6.5 % (ref 0.0–7.0)

## 2019-06-21 NOTE — Progress Notes (Signed)
Patient here today for BP and A1C check per Dr. Gwendlyn Deutscher.  It is a follow-up from last appt.   BP today is 142/60 Pulse: 71.  Checked BP in left arm with regular cuff.  Symptoms present: none.  Patient last took BP med 8:30am today.   Her BP's from the weekend are as follows: Friday - 154/85 pulse: 68 Saturday - 160/99 pulse: 73 Sunday - 136/79 pulse:62 Monday: 144/77 pulse: 67 Pt states these were taken in the am but did not have a specific time.  Per MD's note, she wanted to be consulted while pt in clinic.  However, she is not here this afternoon and I am unable to reach her.  Will document and send her the note given that her BP is not as high as last time.  Verified with preceptors who will defer to Dr. Gwendlyn Deutscher.  Pt informed that her A1C had improved from 7.9 to 6.5.   Routed note to PCP.    Christen Bame, CMA

## 2019-06-21 NOTE — Telephone Encounter (Signed)
CMA's note reviewed. BP looks better today and her A1C has improved.  As discussed with her, we will not make any changes to her regimen for now.  F/U in 3 months as planned.  Continue home BP monitoring and return sooner, if it remains elevated.

## 2019-07-19 ENCOUNTER — Other Ambulatory Visit: Payer: Self-pay | Admitting: Family Medicine

## 2019-08-06 ENCOUNTER — Other Ambulatory Visit: Payer: Self-pay | Admitting: Family Medicine

## 2019-09-17 ENCOUNTER — Telehealth: Payer: Self-pay | Admitting: Family Medicine

## 2019-09-17 NOTE — Telephone Encounter (Signed)
Pt left a voice mail on my line requesting one of the nurses call her. jw

## 2019-09-17 NOTE — Telephone Encounter (Signed)
Attempted to call patient on number given, but it was her place of work and her daughter said that she is not there today.  She thinks that patient needs a referral for her heart doctor.  LM for patient on her home number asking her to call back and confirm what is needed from [place].  If she is currently having concerns then she will need to make an appt with her PCP to discuss and then decide on referral after.  Jazmin Hartsell,CMA

## 2019-09-20 ENCOUNTER — Telehealth: Payer: Self-pay | Admitting: Hematology and Oncology

## 2019-09-20 NOTE — Telephone Encounter (Signed)
[facility] [place]. Left message re 10/28 lab/fu. Patient asked to call to confirm details of appointment. Schedule mailed.

## 2019-09-25 ENCOUNTER — Other Ambulatory Visit: Payer: Self-pay | Admitting: Family Medicine

## 2019-09-28 ENCOUNTER — Encounter: Payer: Self-pay | Admitting: Family Medicine

## 2019-09-28 ENCOUNTER — Other Ambulatory Visit: Payer: Self-pay

## 2019-09-28 ENCOUNTER — Ambulatory Visit: Payer: Medicare Other | Admitting: Family Medicine

## 2019-09-28 ENCOUNTER — Telehealth: Payer: Self-pay | Admitting: *Deleted

## 2019-09-28 VITALS — BP 148/60 | HR 76 | Ht 63.0 in | Wt 163.2 lb

## 2019-09-28 DIAGNOSIS — Z955 Presence of coronary angioplasty implant and graft: Secondary | ICD-10-CM

## 2019-09-28 DIAGNOSIS — I10 Essential (primary) hypertension: Secondary | ICD-10-CM

## 2019-09-28 DIAGNOSIS — E2839 Other primary ovarian failure: Secondary | ICD-10-CM | POA: Diagnosis not present

## 2019-09-28 DIAGNOSIS — E1159 Type 2 diabetes mellitus with other circulatory complications: Secondary | ICD-10-CM

## 2019-09-28 DIAGNOSIS — E039 Hypothyroidism, unspecified: Secondary | ICD-10-CM

## 2019-09-28 DIAGNOSIS — I700 Atherosclerosis of aorta: Secondary | ICD-10-CM

## 2019-09-28 LAB — POCT GLYCOSYLATED HEMOGLOBIN (HGB A1C): HbA1c, POC (controlled diabetic range): 6.6 % (ref 0.0–7.0)

## 2019-09-28 NOTE — Assessment & Plan Note (Signed)
Discharged from endo per patient. F/U as needed.

## 2019-09-28 NOTE — Assessment & Plan Note (Signed)
A1C 6.6 which is goal for her.  Continue Lantus 25 units qd and Metformin 500 mg 2 tab BID. Monitor CBG closely at home.

## 2019-09-28 NOTE — Telephone Encounter (Signed)
A message was left, re: new patient appointment. 

## 2019-09-28 NOTE — Progress Notes (Signed)
Subjective:     Patient ID: [name], female   DOB: June 06, 1941, 78 y.o.   MRN: [id number]  HPI DM2/HTN/HLD: Here for follow-up. She is compliant with her meds. Her home CBG and BP has been running fine. No other concern today. Hypothyroid: Patient stated that she was cleared last year by her endocrinologist. She does not need to f/u with them again. No thyroid complaint today.  Aortic Atherosclerosis: She denies any chest pain, no SOB. She stated that she had not followed up with her cardiologist in 3 years and will like to reestablish care with them again. Hx of MI in the past.  Current Outpatient Medications on File Prior to Visit  Medication Sig Dispense Refill  . amLODipine (NORVASC) 10 MG tablet TAKE ONE TABLET BY MOUTH DAILY (Patient taking differently: Take 10 mg by mouth once a day) 90 tablet 1  . aspirin 81 MG chewable tablet Chew 81 mg by mouth daily.      [name] atorvastatin (LIPITOR) 20 MG tablet TAKE ONE TABLET BY MOUTH DAILY AT 6PM 90 tablet 2  . Blood Glucose Monitoring Suppl (ONE TOUCH ULTRA MINI) w/Device KIT Check blood sugar TID  before meals. Diabetes Mellitus Type 2. E11.9 1 each 0  . carvedilol (COREG) 25 MG tablet TAKE ONE TABLET BY MOUTH TWICE A DAY WITH MEALS 180 tablet 2  . fluticasone (FLONASE) 50 MCG/ACT nasal spray Place 2 sprays into the nose daily as needed. For allergies (Patient not taking: Reported on 11/13/2018) 16 g 6  . Garlic 4081 MG CAPS Take 1,000 mg by mouth daily.    . Insulin Syringe-Needle U-100 (RELION INSULIN SYRINGE) 31G X 15/64" 1 ML MISC USE  ONCE DAILY AS DIRECTED 100 each 3  . isosorbide mononitrate (IMDUR) 60 MG 24 hr tablet TAKE ONE TABLET BY MOUTH DAILY 90 tablet 1  . LANTUS 100 UNIT/ML injection INJECT 25 UNITS INTO THE SKIN EVERY MORNING.  *HOLD IF GLUCOSE LEVEL IS LESS THAN 70* 10 mL 3  . lisinopril-hydrochlorothiazide (PRINZIDE,ZESTORETIC) 20-12.5 MG tablet TAKE TWO TABLETS BY MOUTH DAILY 180 tablet 2  . loratadine (CLARITIN) 10 MG  tablet Take 10 mg by mouth daily as needed for allergies.     . metFORMIN (GLUCOPHAGE-XR) 500 MG 24 hr tablet TAKE TWO TABLETS BY MOUTH TWICE A DAY 360 tablet 1  . Multiple Vitamin (MULTIVITAMIN WITH MINERALS) TABS tablet Take 1 tablet by mouth daily. Reported on 04/02/2016    . nitroGLYCERIN (NITROSTAT) 0.4 MG SL tablet Place 1 tablet (0.4 mg total) under the tongue every 5 (five) minutes as needed for chest pain. (Patient not taking: Reported on 03/05/2018) 25 tablet 3  . OneTouch Delica Lancets 44Y MISC Check blood sugar TID  before meals. Diabetes Mellitus Type 2. E11.9 100 each 4  . ONETOUCH ULTRA test strip USE  STRIP TO CHECK GLUCOSE THREE TIMES DAILY BEFORE MEAL(S) 100 each 2   No current facility-administered medications on file prior to visit.    Past Medical History:  Diagnosis Date  . Aortic arch atherosclerosis ([place]) 08/29/2017  . Coronary artery disease    a. Remote PTCA of the PDA;  b. 02/2009 PCI/CBA to the D1;  c. 11/2011 Cath: LM nl, LAD 20p, 77m D1 patent, RI 20p, LCX nl, RCA 30d, EF 55-65%.  . CTS (carpal tunnel syndrome)   . GERD (gastroesophageal reflux disease)   . 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 History of thyroid cancer   .  Hypothyroid   . Nodule of right lung 08/29/2017   3 mm stable nodule right upper lobe on CTA Chest 08/28/17  . 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 Subscapular pain, left 08/29/2017    Vitals:   09/28/19 0907 09/28/19 0916  BP: (!) 160/58 (!) 148/60  Pulse: 76   SpO2: 95%   Weight: 163 lb 4 oz (74 kg)   Height: '5\' 3"'  (1.6 m)      Review of Systems  Respiratory: Negative.   Cardiovascular: Negative.   Gastrointestinal: Negative.   Neurological: Negative.   All other systems reviewed and are negative.      Objective:   Physical Exam Vitals signs and nursing note reviewed.  Constitutional:      Appearance: Normal appearance.  Neck:     Comments: No  thyromegaly Cardiovascular:     Rate and Rhythm: Normal rate and regular rhythm.     Heart sounds: Normal heart sounds. No murmur.  Pulmonary:     Effort: Pulmonary effort is normal. No respiratory distress.     Breath sounds: Normal breath sounds. No wheezing or rhonchi.  Abdominal:     General: Abdomen is flat. Bowel sounds are normal.     Palpations: Abdomen is soft. There is no mass.  Musculoskeletal:     Right lower leg: No edema.     Left lower leg: No edema.  Neurological:     Mental Status: She is alert.        Assessment:     DM2 HTN HLD Aortic atherosclerosis HM: Dexa scan ordered. She will schedule appointment.    Plan:     Check problem list.

## 2019-09-28 NOTE — Assessment & Plan Note (Signed)
No acute findings. Compliant with ASA and Statin. Cards referral placed.

## 2019-09-28 NOTE — Patient Instructions (Signed)
Bone Density Test The bone density test uses a special type of X-ray to measure the amount of calcium and other minerals in your bones. It can measure bone density in the hip and the spine. The test procedure is similar to having a regular X-ray. This test may also be called:  Bone densitometry.  Bone mineral density test.  Dual-energy X-ray absorptiometry (DEXA). You may have this test to:  Diagnose a condition that causes weak or thin bones (osteoporosis).  Screen you for osteoporosis.  Predict your risk for a broken bone (fracture).  Determine how well your osteoporosis treatment is working. Tell a health care provider about:  Any allergies you have.  All medicines you are taking, including vitamins, herbs, eye drops, creams, and over-the-counter medicines.  Any problems you or family members have had with anesthetic medicines.  Any blood disorders you have.  Any surgeries you have had.  Any medical conditions you have.  Whether you are pregnant or may be pregnant.  Any medical tests you have had within the past 14 days that used contrast material. What are the risks? Generally, this is a safe procedure. However, it does expose you to a small amount of radiation, which can slightly increase your cancer risk. What happens before the procedure?  Do not take any calcium supplements starting 24 hours before your test.  Remove all metal jewelry, eyeglasses, dental appliances, and any other metal objects. What happens during the procedure?   You will lie down on an exam table. There will be an X-ray generator below you and an imaging device above you.  Other devices, such as boxes or braces, may be used to position your body properly for the scan.  The machine will slowly scan your body. You will need to keep still.  The images will show up on a screen in the room. Images will be examined by a specialist after your test is done. The procedure may vary among health care  providers and hospitals. What happens after the procedure?  It is up to you to get your test results. Ask your health care provider, or the department that is doing the test, when your results will be ready. Summary  A bone density test is an imaging test that uses a type of X-ray to measure the amount of calcium and other minerals in your bones.  The test may be used to diagnose or screen you for a condition that causes weak or thin bones (osteoporosis), predict your risk for a broken bone (fracture), or determine how well your osteoporosis treatment is working.  Do not take any calcium supplements starting 24 hours before your test.  Ask your health care provider, or the department that is doing the test, when your results will be ready. This information is not intended to replace advice given to you by your health care provider. Make sure you discuss any questions you have with your health care provider. Document Released: 12/31/2004 Document Revised: 12/25/2017 Document Reviewed: 10/13/2017 Elsevier Patient Education  2020 [employer].

## 2019-09-28 NOTE — Assessment & Plan Note (Signed)
No acute findings. Continue ASA and Statin. Cards referral placed per her request.

## 2019-09-28 NOTE — Assessment & Plan Note (Signed)
BP slightly elevated but improved with recheck. Continue current regimen. F/U in 2-4 weeks.

## 2019-10-13 ENCOUNTER — Other Ambulatory Visit: Payer: Self-pay

## 2019-10-13 ENCOUNTER — Ambulatory Visit
Admission: RE | Admit: 2019-10-13 | Discharge: 2019-10-13 | Disposition: A | Discharge status: Home / Self Care | Payer: Medicare Other | Source: Ambulatory Visit | Attending: Family Medicine | Admitting: Family Medicine

## 2019-10-13 DIAGNOSIS — E2839 Other primary ovarian failure: Secondary | ICD-10-CM

## 2019-10-14 ENCOUNTER — Telehealth: Payer: Self-pay | Admitting: Family Medicine

## 2019-10-14 NOTE — Telephone Encounter (Signed)
Dexa Scan discussed.  I recommended OscalD. She said she is already on Vit D and Calcium which I don't see listed. I will f/u when she come in to see me.

## 2019-10-19 ENCOUNTER — Other Ambulatory Visit: Payer: Self-pay

## 2019-10-19 ENCOUNTER — Encounter: Payer: Self-pay | Admitting: Family Medicine

## 2019-10-19 ENCOUNTER — Ambulatory Visit: Payer: Medicare Other | Admitting: Family Medicine

## 2019-10-19 VITALS — BP 168/69 | HR 75 | Wt 162.8 lb

## 2019-10-19 DIAGNOSIS — I10 Essential (primary) hypertension: Secondary | ICD-10-CM

## 2019-10-19 DIAGNOSIS — Z23 Encounter for immunization: Secondary | ICD-10-CM

## 2019-10-19 MED ORDER — EPLERENONE 25 MG PO TABS: 25.0000 mg | ORAL_TABLET | Freq: Every day | ORAL | 1 refills | Status: DC

## 2019-10-19 NOTE — Patient Instructions (Addendum)
It was nice seeing you today. Your BP is still running high. I wLikely because you did not take your medicine this morning. Please take medicine 2 hours before coming to the clinic.  I will like your BP to be between 100/60 and 140/90. If you fall off range, or you feel weak, dizzy or tired please call. We need to check your potassium in 5-7 days with this new medication. See me back in 1 week.

## 2019-10-19 NOTE — Progress Notes (Signed)
Subjective:     Patient ID: [name], female   DOB: 29-Jan-1941, 78 y.o.   MRN: [id number]  HPI HTN: Here for follow-up. She did not take her AM BP meds today. Her home BP ranges from 831D to 176 systolic. Denies any concern today.   Current Outpatient Medications on File Prior to Visit  Medication Sig Dispense Refill  . amLODipine (NORVASC) 10 MG tablet TAKE ONE TABLET BY MOUTH DAILY (Patient taking differently: Take 10 mg by mouth once a day) 90 tablet 1  . aspirin 81 MG chewable tablet Chew 81 mg by mouth daily.      [name] atorvastatin (LIPITOR) 20 MG tablet TAKE ONE TABLET BY MOUTH DAILY AT 6PM 90 tablet 2  . Blood Glucose Monitoring Suppl (ONE TOUCH ULTRA MINI) w/Device KIT Check blood sugar TID  before meals. Diabetes Mellitus Type 2. E11.9 1 each 0  . carvedilol (COREG) 25 MG tablet TAKE ONE TABLET BY MOUTH TWICE A DAY WITH MEALS 180 tablet 2  . fluticasone (FLONASE) 50 MCG/ACT nasal spray Place 2 sprays into the nose daily as needed. For allergies (Patient not taking: Reported on 11/13/2018) 16 g 6  . Garlic 1607 MG CAPS Take 1,000 mg by mouth daily.    . Insulin Syringe-Needle U-100 (RELION INSULIN SYRINGE) 31G X 15/64" 1 ML MISC USE  ONCE DAILY AS DIRECTED 100 each 3  . isosorbide mononitrate (IMDUR) 60 MG 24 hr tablet TAKE ONE TABLET BY MOUTH DAILY 90 tablet 1  . LANTUS 100 UNIT/ML injection INJECT 25 UNITS INTO THE SKIN EVERY MORNING.  *HOLD IF GLUCOSE LEVEL IS LESS THAN 70* 10 mL 3  . lisinopril-hydrochlorothiazide (PRINZIDE,ZESTORETIC) 20-12.5 MG tablet TAKE TWO TABLETS BY MOUTH DAILY 180 tablet 2  . loratadine (CLARITIN) 10 MG tablet Take 10 mg by mouth daily as needed for allergies.     . metFORMIN (GLUCOPHAGE-XR) 500 MG 24 hr tablet TAKE TWO TABLETS BY MOUTH TWICE A DAY 360 tablet 1  . Multiple Vitamin (MULTIVITAMIN WITH MINERALS) TABS tablet Take 1 tablet by mouth daily. Reported on 04/02/2016    . nitroGLYCERIN (NITROSTAT) 0.4 MG SL tablet Place 1 tablet (0.4 mg total)  under the tongue every 5 (five) minutes as needed for chest pain. (Patient not taking: Reported on 03/05/2018) 25 tablet 3  . OneTouch Delica Lancets 37T MISC Check blood sugar TID  before meals. Diabetes Mellitus Type 2. E11.9 100 each 4  . ONETOUCH ULTRA test strip USE  STRIP TO CHECK GLUCOSE THREE TIMES DAILY BEFORE MEAL(S) 100 each 2   No current facility-administered medications on file prior to visit.    Past Medical History:  Diagnosis Date  . Aortic arch atherosclerosis ([place]) 08/29/2017  . Coronary artery disease    a. Remote PTCA of the PDA;  b. 02/2009 PCI/CBA to the D1;  c. 11/2011 Cath: LM nl, LAD 20p, 105m D1 patent, RI 20p, LCX nl, RCA 30d, EF 55-65%.  . CTS (carpal tunnel syndrome)   . GERD (gastroesophageal reflux disease)   . 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 History of thyroid cancer   . Hypothyroid   . Nodule of right lung 08/29/2017   3 mm stable nodule right upper lobe on CTA Chest 08/28/17  . 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 Subscapular pain, left 08/29/2017    Vitals:   10/19/19 0858 10/19/19 0910  BP: (!) 138/58 (!) 159/70  Pulse: 75  SpO2: 93%   Weight: 162 lb 12.8 oz (73.8 kg)     Review of Systems  Respiratory: Negative.   Cardiovascular: Negative.   Gastrointestinal: Negative.   Musculoskeletal: Negative.   Neurological: Negative.   All other systems reviewed and are negative.      Objective:   Physical Exam Vitals signs and nursing note reviewed.  Constitutional:      Appearance: Normal appearance.  Cardiovascular:     Rate and Rhythm: Normal rate and regular rhythm.     Heart sounds: Normal heart sounds. No murmur.  Pulmonary:     Effort: Pulmonary effort is normal. No respiratory distress.     Breath sounds: Normal breath sounds. No stridor. No wheezing or rhonchi.  Abdominal:     General: Bowel sounds are normal. There is no distension.     Palpations: There  is no mass.     Tenderness: There is no abdominal tenderness.  Musculoskeletal:     Right lower leg: No edema.     Left lower leg: No edema.  Neurological:     Mental Status: She is alert.        Assessment:     HTN    Plan:     Check problem list.   175/86 Flu shot given today.

## 2019-10-19 NOTE — Progress Notes (Signed)
[facility] CONSULT NOTE  Patient Care Team: [name], MD as PCP - General (Family Medicine)  CHIEF COMPLAINTS/PURPOSE OF CONSULTATION:  History of iron deficiency anemia   HISTORY OF PRESENTING ILLNESS:  [name] 78 y.o. female is here because of a history of iron deficiency anemia. She has a history of diverticulitis and hemorrhoids. She has previously been treated with oral iron and IV iron. She is a former patient of Dr. Walden Field and is transferring to my care. She presents to the clinic today for initial evaluation.  She reports no increased symptoms of fatigue.  I reviewed her history as well as all the testing that she had done over the several years.  I reviewed her records extensively and collaborated the history with the patient.  MEDICAL HISTORY:  Past Medical History:  Diagnosis Date  . Aortic arch atherosclerosis ([place]) 08/29/2017  . Coronary artery disease    a. Remote PTCA of the PDA;  b. 02/2009 PCI/CBA to the D1;  c. 11/2011 Cath: LM nl, LAD 20p, 48m D1 patent, RI 20p, LCX nl, RCA 30d, EF 55-65%.  . CTS (carpal tunnel syndrome)   . GERD (gastroesophageal reflux disease)   . 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 History of thyroid cancer   . Hypothyroid   . Nodule of right lung 08/29/2017   3 mm stable nodule right upper lobe on CTA Chest 08/28/17  . 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 Subscapular pain, left 08/29/2017    SURGICAL HISTORY: Past Surgical History:  Procedure Laterality Date  . ANGIOPLASTY     PDA 90-40 %  . BARTHOLIN GLAND CYST EXCISION  09-13-2004   Keratosis- no CA  . BREAST BIOPSY     LEFT SPOT REMOVED  . BREAST EXCISIONAL BIOPSY Left   . CARDIAC CATHETERIZATION  03/15/2009   stenosis of diagonal branch of LAD- PTCA performed  . CHOLECYSTECTOMY  08-23-2002  . CORONARY ANGIOPLASTY    . ENDOMETRIAL BIOPSY  02-20-1998  . LEFT HEART CATHETERIZATION  WITH CORONARY ANGIOGRAM N/A 11/26/2011   Procedure: LEFT HEART CATHETERIZATION WITH CORONARY ANGIOGRAM;  Surgeon: Peter M JMartinique MD;  [facility];  Service: Cardiovascular;  Laterality: N/A;  . PTCA  10/23/2000 and 03/15/09  . 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 TONSILLECTOMY    . TUBAL LIGATION      SOCIAL HISTORY: Social History   Socioeconomic History  . Marital status: Widowed    Spouse name: Not on file  . Number of children: 6  . Years of education: 14 . Highest education level: Not on file  Occupational History  . Occupation: HTEFL teacher [employer] . Financial resource strain: Not on file  . Food insecurity    Worry: Not on file    Inability: Not on file  . Transportation needs    Medical: Not on file    Non-medical: Not on file  Tobacco Use  . Smoking status: Former Smoker    Types: Cigarettes    Quit date: 12/23/1980    Years since quitting: 38.8  . Smokeless tobacco: Former USystems developer   Types: [place]: 08/23/1994  Substance and Sexual Activity  . Alcohol use: No  . Drug use: No  . Sexual activity: Never  Lifestyle  . Physical activity    Days per  week: Not on file    Minutes per session: Not on file  . Stress: Not on file  Relationships  . Social Herbalist on phone: Not on file    Gets together: Not on file    Attends religious service: Not on file    Active member of club or organization: Not on file    Attends meetings of clubs or organizations: Not on file    Relationship status: Not on file  . Intimate partner violence    Fear of current or ex partner: Not on file    Emotionally abused: Not on file    Physically abused: Not on file    Forced sexual activity: Not on file  Other Topics Concern  . Not on file  Social History Narrative   Lives in [place] with Granddaughter.  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  She is not routinely exercising.    Has her GED    FAMILY HISTORY: Family History  Problem Relation Age of Onset  . Diabetes Mother        Deceased- hypoglycemic coma   . Hypertension Mother   . Other Father        unaware of his health history - died when pt was 78 yr old.  [name] Heart attack Sister   . Diabetes Mellitus II Brother   . Heart attack Brother   . Heart attack Brother   . Diabetes Mellitus II Brother   . Epilepsy Brother   . Breast cancer Daughter     ALLERGIES:  is allergic to codeine; sulfamethoxazole-trimethoprim; and tramadol.  MEDICATIONS:  Current Outpatient Medications  Medication Sig Dispense Refill  . amLODipine (NORVASC) 10 MG tablet TAKE ONE TABLET BY MOUTH DAILY (Patient taking differently: Take 10 mg by mouth once a day) 90 tablet 1  . aspirin 81 MG chewable tablet Chew 81 mg by mouth daily.      [name] atorvastatin (LIPITOR) 20 MG tablet TAKE ONE TABLET BY MOUTH DAILY AT 6PM 90 tablet 2  . Blood Glucose Monitoring Suppl (ONE TOUCH ULTRA MINI) w/Device KIT Check blood sugar TID  before meals. Diabetes Mellitus Type 2. E11.9 1 each 0  . carvedilol (COREG) 25 MG tablet TAKE ONE TABLET BY MOUTH TWICE A DAY WITH MEALS 180 tablet 2  . fluticasone (FLONASE) 50 MCG/ACT nasal spray Place 2 sprays into the nose daily as needed. For allergies (Patient not taking: Reported on 11/13/2018) 16 g 6  . Garlic 2641 MG CAPS Take 1,000 mg by mouth daily.    . Insulin Syringe-Needle U-100 (RELION INSULIN SYRINGE) 31G X 15/64" 1 ML MISC USE  ONCE DAILY AS DIRECTED 100 each 3  . isosorbide mononitrate (IMDUR) 60 MG 24 hr tablet TAKE ONE TABLET BY MOUTH DAILY 90 tablet 1  . LANTUS 100 UNIT/ML injection INJECT 25 UNITS INTO THE SKIN EVERY MORNING.  *HOLD IF GLUCOSE LEVEL IS LESS THAN 70* 10 mL 3  . lisinopril-hydrochlorothiazide (PRINZIDE,ZESTORETIC) 20-12.5 MG tablet TAKE TWO TABLETS BY MOUTH DAILY 180 tablet 2  . loratadine (CLARITIN) 10 MG tablet Take 10 mg by mouth daily as needed for allergies.     . metFORMIN  (GLUCOPHAGE-XR) 500 MG 24 hr tablet TAKE TWO TABLETS BY MOUTH TWICE A DAY 360 tablet 1  . Multiple Vitamin (MULTIVITAMIN WITH MINERALS) TABS tablet Take 1 tablet by mouth daily. Reported on 04/02/2016    . nitroGLYCERIN (NITROSTAT) 0.4 MG SL tablet Place 1 tablet (0.4  mg total) under the tongue every 5 (five) minutes as needed for chest pain. (Patient not taking: Reported on 03/05/2018) 25 tablet 3  . OneTouch Delica Lancets 16X MISC Check blood sugar TID  before meals. Diabetes Mellitus Type 2. E11.9 100 each 4  . ONETOUCH ULTRA test strip USE  STRIP TO CHECK GLUCOSE THREE TIMES DAILY BEFORE MEAL(S) 100 each 2   No current facility-administered medications for this visit.     REVIEW OF SYSTEMS:   Constitutional: Denies fevers, chills or abnormal night sweats Eyes: Denies blurriness of vision, double vision or watery eyes Ears, nose, mouth, throat, and face: Denies mucositis or sore throat Respiratory: Denies cough, dyspnea or wheezes Cardiovascular: Denies palpitation, chest discomfort or lower extremity swelling Gastrointestinal:  Denies nausea, heartburn or change in bowel habits Skin: Denies abnormal skin rashes Lymphatics: Denies new lymphadenopathy or easy bruising Neurological:Denies numbness, tingling or new weaknesses Behavioral/Psych: Mood is stable, no new changes  Breast: Denies any palpable lumps or discharge All other systems were reviewed with the patient and are negative.  PHYSICAL EXAMINATION: ECOG PERFORMANCE STATUS: 1 - Symptomatic but completely ambulatory  Vitals:   10/20/19 1156  BP: (!) 175/56  Pulse: 84  Resp: 18  Temp: 98.7 F (37.1 C)  SpO2: 97%   Filed Weights   10/20/19 1156  Weight: 159 lb 14.4 oz (72.5 kg)    GENERAL:alert, no distress and comfortable SKIN: skin color, texture, turgor are normal, no rashes or significant lesions EYES: normal, conjunctiva are pink and non-injected, sclera clear OROPHARYNX:no exudate, no erythema and lips, buccal  mucosa, and tongue normal  NECK: supple, thyroid normal size, non-tender, without nodularity LYMPH:  no palpable lymphadenopathy in the cervical, axillary or inguinal LUNGS: clear to auscultation and percussion with normal breathing effort HEART: regular rate & rhythm and no murmurs and no lower extremity edema ABDOMEN:abdomen soft, non-tender and normal bowel sounds Musculoskeletal:no cyanosis of digits and no clubbing  PSYCH: alert & oriented x 3 with fluent speech NEURO: no focal motor/sensory deficits  LABORATORY DATA:  I have reviewed the data as listed Lab Results  Component Value Date   WBC 4.7 10/20/2019   HGB 9.7 (L) 10/20/2019   HCT 30.6 (L) 10/20/2019   MCV 91.3 10/20/2019   PLT 132 (L) 10/20/2019   Lab Results  Component Value Date   NA 146 (H) 10/20/2019   K 4.1 10/20/2019   CL 105 10/20/2019   CO2 29 10/20/2019    RADIOGRAPHIC STUDIES: I have personally reviewed the radiological reports and agreed with the findings in the report.  ASSESSMENT AND PLAN:  Iron deficiency anemia January 19, 2019 hemoglobin 9.3 platelets 126,000.   February 09, 2019 hemoglobin 9.6 platelets 132,000 Ferritin 23.  Folate was normal B12 was normal.  The patient reports history of diverticulitis.and  hemorrhoids.  Last colonoscopy was 07/02/2013 and showed diverticuli as well as hemorrhoids. 04/29/2019: Hemoglobin 9.4, ferritin 216  Work-up did not reveal thalassemia. IV iron treatment: March 2020  Lab review 10/20/2019: Hemoglobin stable at 9.7, MCV 91.3 I discussed with the patient that the results indicate stable anemia.  It is not iron deficient at this time. 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 She does not have any symptoms and therefore we can watch and monitor this at this time.  I discussed with her about erythropoietin replacement therapy as a treatment for anemia of chronic kidney disease.  She is not keen on injections or any needles. 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 We will watch and monitor her every 6 months.  All questions were answered. The patient knows to call the clinic with any problems, questions or concerns.   Rulon Eisenmenger, MD, MPH 10/20/2019    I, Molly Dorshimer, am acting as scribe for Nicholas Lose, MD.  I have reviewed the above documentation for accuracy and completeness, and I agree with the above.

## 2019-10-19 NOTE — Assessment & Plan Note (Signed)
She came in with her BP machine and her read on her home machine during this visit was 175/86. I rechecked BP multiple times and it was elevated. She later mentioned she did not take her BP meds this morning since she has not had breakfast and her tummy feels funny. I canceled Eplenarone which I wanted to start her on and called the pharmacy to cancel the e-prescription. Gerald Stabs was the pharmacist or tech I spoke with). She is advised to return in 2 weeks for BP check and ensure she took her BP meds 2 hours prior to presentation. She agreed with the plan.

## 2019-10-20 ENCOUNTER — Inpatient Hospital Stay: Payer: Medicare Other

## 2019-10-20 ENCOUNTER — Other Ambulatory Visit: Payer: Self-pay

## 2019-10-20 ENCOUNTER — Inpatient Hospital Stay: Payer: Medicare Other | Attending: Hematology and Oncology | Admitting: Hematology and Oncology

## 2019-10-20 DIAGNOSIS — D509 Iron deficiency anemia, unspecified: Secondary | ICD-10-CM | POA: Diagnosis not present

## 2019-10-20 DIAGNOSIS — D563 Thalassemia minor: Secondary | ICD-10-CM

## 2019-10-20 DIAGNOSIS — D508 Other iron deficiency anemias: Secondary | ICD-10-CM

## 2019-10-20 DIAGNOSIS — Z79899 Other long term (current) drug therapy: Secondary | ICD-10-CM | POA: Diagnosis not present

## 2019-10-20 LAB — CMP (CANCER CENTER ONLY)
ALT: 14 U/L (ref 0–44)
AST: 14 U/L — ABNORMAL LOW (ref 15–41)
Albumin: 3.8 g/dL (ref 3.5–5.0)
Alkaline Phosphatase: 78 U/L (ref 38–126)
Anion gap: 12 (ref 5–15)
BUN: 17 mg/dL (ref 8–23)
CO2: 29 mmol/L (ref 22–32)
Calcium: 9.8 mg/dL (ref 8.9–10.3)
Chloride: 105 mmol/L (ref 98–111)
Creatinine: 1.13 mg/dL — ABNORMAL HIGH (ref 0.44–1.00)
GFR, Est AFR Am: 55 mL/min — ABNORMAL LOW (ref >60)
GFR, Estimated: 47 mL/min — ABNORMAL LOW (ref >60)
Glucose, Bld: 99 mg/dL (ref 70–99)
Potassium: 4.1 mmol/L (ref 3.5–5.1)
Sodium: 146 mmol/L — ABNORMAL HIGH (ref 135–145)
Total Bilirubin: 0.6 mg/dL (ref 0.3–1.2)
Total Protein: 7.1 g/dL (ref 6.5–8.1)

## 2019-10-20 LAB — CBC WITH DIFFERENTIAL (CANCER CENTER ONLY)
Abs Immature Granulocytes: 0.02 10*3/uL (ref 0.00–0.07)
Basophils Absolute: 0 10*3/uL (ref 0.0–0.1)
Basophils Relative: 1 %
Eosinophils Absolute: 0.1 10*3/uL (ref 0.0–0.5)
Eosinophils Relative: 2 %
HCT: 30.6 % — ABNORMAL LOW (ref 36.0–46.0)
Hemoglobin: 9.7 g/dL — ABNORMAL LOW (ref 12.0–15.0)
Immature Granulocytes: 0 %
Lymphocytes Relative: 39 %
Lymphs Abs: 1.8 10*3/uL (ref 0.7–4.0)
MCH: 29 pg (ref 26.0–34.0)
MCHC: 31.7 g/dL (ref 30.0–36.0)
MCV: 91.3 fL (ref 80.0–100.0)
Monocytes Absolute: 0.5 10*3/uL (ref 0.1–1.0)
Monocytes Relative: 11 %
Neutro Abs: 2.2 10*3/uL (ref 1.7–7.7)
Neutrophils Relative %: 47 %
Platelet Count: 132 10*3/uL — ABNORMAL LOW (ref 150–400)
RBC: 3.35 MIL/uL — ABNORMAL LOW (ref 3.87–5.11)
RDW: 14.4 % (ref 11.5–15.5)
WBC Count: 4.7 10*3/uL (ref 4.0–10.5)
nRBC: 0 % (ref 0.0–0.2)

## 2019-10-20 LAB — LACTATE DEHYDROGENASE: LDH: 156 U/L (ref 98–192)

## 2019-10-20 LAB — FERRITIN: Ferritin: 67 ng/mL (ref 11–307)

## 2019-10-20 NOTE — Assessment & Plan Note (Signed)
January 19, 2019 hemoglobin 9.3 platelets 126,000.   February 09, 2019 hemoglobin 9.6 platelets 132,000 Ferritin 23.  Folate was normal B12 was normal.  The patient reports history of diverticulitis.and  hemorrhoids.  Last colonoscopy was 07/02/2013 and showed diverticuli as well as hemorrhoids. 04/29/2019: Hemoglobin 9.4, ferritin 216  Work-up did not reveal thalassemia. IV iron treatment: March 2020  Lab review 10/20/2019:

## 2019-10-21 ENCOUNTER — Telehealth: Payer: Self-pay | Admitting: Hematology and Oncology

## 2019-10-21 NOTE — Telephone Encounter (Signed)
I left a message regarding schedule  

## 2019-10-29 NOTE — Progress Notes (Signed)
CARDIOLOGY CONSULT NOTE       Patient ID: [name] MRN: [id number] DOB/AGE: [birth year] 78 y.o.  Admit date: (Not on file) Referring Physician: Gwendlyn [name] Primary Physician: Michele Feil, MD Primary Cardiologist: [name]  Reason for Consultation: CAD  Active Problems:   * No active hospital problems. *   HPI:  78 y.o. last seen by me in 2015. History of CAD with PTCA PDA in 2001 and cutting balloon to diagonal in 2010. Last cath 11/2011 no obstructive disease  CRF;s include HTN, IDDM, HLD. Also has low thyroid. Seen in ER 08/29/17 with left shoulder pain. R/O no acute ECG changes not thought to be cardiac in nature A1c levated 7.9 poorly controlled BS. 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 Aortic arch atherosclerosis ([place]) 08/29/2017  . Carpal tunnel syndrome 02/19/2007   Qualifier: Diagnosis of  By: [name]    . Coronary artery disease    a. Remote PTCA of the PDA;  b. 02/2009 PCI/CBA to the D1;  c. 11/2011 Cath: LM nl, LAD 20p, 21m, D1 patent, RI 20p, LCX nl, RCA 30d, EF 55-65%.  . CTS (carpal tunnel syndrome)   . GERD (gastroesophageal reflux disease)   . 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 History of thyroid cancer   . Hypothyroid   . Nodule of right lung 08/29/2017   3 mm stable nodule right upper lobe on CTA Chest 08/28/17  . 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 Subscapular pain, left 08/29/2017    Family History  Problem Relation Age of Onset  . Diabetes Mother        Deceased- hypoglycemic coma   . Hypertension Mother   . Other  Father        unaware of his health history - died when pt was 78 yr old.  [name] Heart attack Sister   . Diabetes Mellitus II Brother   . Heart attack Brother   . Heart attack Brother   . Diabetes Mellitus II Brother   . Epilepsy Brother   . Breast cancer Daughter     Social History   Socioeconomic History  . Marital status: Widowed    Spouse name: Not on file  . Number of children: 6  . Years of education: 10  . Highest education level: Not on file  Occupational History  . Occupation: TEFL teacher: [employer]  . Financial resource strain: Not on file  . Food insecurity    Worry: Not on file    Inability: Not on file  . Transportation needs    Medical: Not on file    Non-medical: Not on file  Tobacco Use  . Smoking status: Former Smoker    Types: Cigarettes    Quit date: 12/23/1980    Years since quitting: 38.8  . Smokeless tobacco: Former Systems developer    Types: [place] date: 08/23/1994  Substance and Sexual Activity  . Alcohol use: No  . Drug use:  No  . Sexual activity: Never  Lifestyle  . Physical activity    Days per week: Not on file    Minutes per session: Not on file  . Stress: Not on file  Relationships  . Social Herbalist on phone: Not on file    Gets together: Not on file    Attends religious service: Not on file    Active member of club or organization: Not on file    Attends meetings of clubs or organizations: Not on file    Relationship status: Not on file  . Intimate partner violence    Fear of current or ex partner: Not on file    Emotionally abused: Not on file    Physically abused: Not on file    Forced sexual activity: Not on file  Other Topics Concern  . Not on file  Social History Narrative   Lives in [name] with Granddaughter.  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  She is not routinely exercising.   Has her GED    Past Surgical History:  Procedure Laterality Date  . ANGIOPLASTY     PDA  90-40 %  . BARTHOLIN GLAND CYST EXCISION  09-13-2004   Keratosis- no CA  . BREAST BIOPSY     LEFT SPOT REMOVED  . BREAST EXCISIONAL BIOPSY Left   . CARDIAC CATHETERIZATION  03/15/2009   stenosis of diagonal branch of LAD- PTCA performed  . CHOLECYSTECTOMY  08-23-2002  . CORONARY ANGIOPLASTY    . ENDOMETRIAL BIOPSY  02-20-1998  . LEFT HEART CATHETERIZATION WITH CORONARY ANGIOGRAM N/A 11/26/2011   Procedure: LEFT HEART CATHETERIZATION WITH CORONARY ANGIOGRAM;  Surgeon: Michele Hinnant M Martinique, MD;  Location: [facility] C[facility] LAB;  Service: Cardiovascular;  Laterality: N/A;  . PTCA  10/23/2000 and 03/15/09  . 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 TONSILLECTOMY    . TUBAL LIGATION          Physical Exam: Blood pressure (!) 150/68, pulse 67, height 5\' 3"  (1.6 m), weight 162 lb (73.5 kg), last menstrual period 11/24/1992, SpO2 98 %.   Affect appropriate Healthy:  appears stated age 20: normal Neck supple with no adenopathy JVP normal no bruits no thyromegaly Lungs clear with no wheezing and good diaphragmatic motion Heart:  S1/S2 no murmur, no rub, gallop or click PMI normal Abdomen: benighn, BS positve, no tenderness, no AAA no bruit.  No HSM or HJR Distal pulses intact with no bruits No edema Neuro non-focal Skin warm and dry No muscular weakness   Labs:   Lab Results  Component Value Date   WBC 4.7 10/20/2019   HGB 9.7 (L) 10/20/2019   HCT 30.6 (L) 10/20/2019   MCV 91.3 10/20/2019   PLT 132 (L) 10/20/2019   No results for input(s): NA, K, CL, CO2, BUN, CREATININE, CALCIUM, PROT, BILITOT, ALKPHOS, ALT, AST, GLUCOSE in the last 168 hours.  Invalid input(s): LABALBU Lab Results  Component Value Date   CKTOTAL 84 11/26/2011   CKMB 2.2 11/26/2011   TROPONINI <0.03 08/29/2017    Lab Results  Component Value Date   CHOL 120 01/19/2019   CHOL 111 (L) 08/06/2016   CHOL 117 01/18/2014   Lab Results  Component Value Date   HDL 43 01/19/2019   HDL 50  08/06/2016   HDL 45 01/18/2014   Lab Results  Component Value Date   LDLCALC 51 01/19/2019   LDLCALC 37 08/06/2016   [place]  49 01/18/2014   Lab Results  Component Value Date   TRIG 131 01/19/2019   TRIG 120 08/06/2016   TRIG 113 01/18/2014   Lab Results  Component Value Date   CHOLHDL 2.8 01/19/2019   CHOLHDL 2.2 08/06/2016   CHOLHDL 2.6 01/18/2014   Lab Results  Component Value Date   LDLDIRECT 76 02/06/2011      Radiology: Dg Bone Density  Result Date: 10/13/2019 EXAM: DUAL X-RAY ABSORPTIOMETRY (DXA) FOR BONE MINERAL DENSITY IMPRESSION: Referring Physician:  Kinnie [name] Your patient completed a BMD test using Lunar IDXA DXA system ( analysis version: 16 ) manufactured by [employer]. Technologist: AW PATIENT: Name: Michele, [name] Patient ID: [id number] Birth Date: 1941-02-16 Height: 63.0 in. Sex: Female Measured: 10/13/2019 Weight: 163.3 lbs. Indications: Advanced Age, Estrogen Deficient, Hypothyroid, Insulin for Diabetes, Postmenopausal Fractures: None Treatments: Multivitamin ASSESSMENT: The BMD measured at Forearm Radius 33% is 0.957 g/cm2 with a T-score of 0.8. This patient is considered normal according to [facility]) criteria. There has been a statistically significant decrease in BMD of Left hip since prior exam dated 10/11/2008. The scan quality is good. Lumbar spine was not utilized due to advanced degenerative changes. Prior DXA exam performed on Hologic device measures only unilateral hip (not Total Mean Hip). Therefore, current Total Mean Hip cannot be compared to prior exam. Site Region Measured Date Measured Age YA BMD Significant CHANGE T-score DualFemur Neck Left 10/13/2019 76.8 0.10 1.158 g/cm2 * DualFemur Neck Left 10/11/2008 65.8 1.8 1.271 g/cm2 DualFemur Total Mean 10/13/2019 76.8 1.1 1.141 g/cm2 Left Forearm Radius 33% 10/13/2019 76.8 0.8 0.957 g/cm2 World Health Organization [facility]) criteria for post-menopausal, Caucasian Women: Normal        T-score at or above -1 SD Osteopenia   T-score between -1 and -2.5 SD Osteoporosis T-score at or below -2.5 SD RECOMMENDATION: 1. All patients should optimize calcium and vitamin D intake. 2. Consider FDA approved medical therapies in postmenopausal women and men aged 8 years and older, based on the following: a. A hip or vertebral (clinical or morphometric) fracture b. T- score < or = -2.5 at the femoral neck or spine after appropriate evaluation to exclude secondary causes c. Low bone mass (T-score between -1.0 and -2.5 at the femoral neck or spine) and a 10 year probability of a hip fracture > or = 3% or a 10 year probability of a major osteoporosis-related fracture > or = 20% based on the [place]-adapted WHO algorithm d. Clinician judgment and/or patient preferences may indicate treatment for people with 10-year fracture probabilities above or below these levels FOLLOW-UP: Patients with diagnosis of osteoporosis or at high risk for fracture should have regular bone mineral density tests. For patients eligible for Medicare, routine testing is allowed once every 2 years. The testing frequency can be increased to one year for patients who have rapidly progressing disease, those who are receiving or discontinuing medical therapy to restore bone mass, or have additional risk factors. I have reviewed this report and agree with the above findings. [facility] Radiology Electronically Signed   By: Lowella Grip III M.D.   On: 10/13/2019 13:57    EKG: 11/03/19  SR rate 67 chronic inferior lateral T wave inversions    ASSESSMENT AND PLAN:   1. CAD:  Distant history of PCI PDA/Diagonal.  No obstructive disease by cath in 2012  No agnina [name] nitro called in  2. HLD:  Continue statin labs with primary  3. DM:  Discussed low carb  diet.  Target hemoglobin A1c is 6.5 or less.  Continue current medications.  4. Anemia:  ? Of chronic disease stable f/u with Dr [name] has had Rx with IV iron March 2020  5. HTN:   Well controlled.  Continue current medications and low sodium Dash type diet.      SignedJenkins Rouge 11/03/2019, 2:37 PM

## 2019-11-01 ENCOUNTER — Encounter: Payer: Self-pay | Admitting: Family Medicine

## 2019-11-02 ENCOUNTER — Other Ambulatory Visit: Payer: Self-pay

## 2019-11-02 ENCOUNTER — Other Ambulatory Visit: Payer: Self-pay | Admitting: Family Medicine

## 2019-11-02 ENCOUNTER — Ambulatory Visit (INDEPENDENT_AMBULATORY_CARE_PROVIDER_SITE_OTHER): Payer: Medicare Other | Admitting: Family Medicine

## 2019-11-02 ENCOUNTER — Telehealth: Payer: Self-pay

## 2019-11-02 ENCOUNTER — Encounter: Payer: Self-pay | Admitting: Family Medicine

## 2019-11-02 DIAGNOSIS — E1159 Type 2 diabetes mellitus with other circulatory complications: Secondary | ICD-10-CM

## 2019-11-02 DIAGNOSIS — E119 Type 2 diabetes mellitus without complications: Secondary | ICD-10-CM

## 2019-11-02 DIAGNOSIS — I10 Essential (primary) hypertension: Secondary | ICD-10-CM | POA: Diagnosis not present

## 2019-11-02 MED ORDER — ZOSTER VAC RECOMB ADJUVANTED 50 MCG/0.5ML IM SUSR: 0.5000 mL | Freq: Once | INTRAMUSCULAR | 0 refills | Status: DC

## 2019-11-02 MED ORDER — ZOSTER VAC RECOMB ADJUVANTED 50 MCG/0.5ML IM SUSR: 0.5000 mL | Freq: Once | INTRAMUSCULAR | 1 refills | Status: AC

## 2019-11-02 NOTE — Assessment & Plan Note (Signed)
Her BP is fairly okay today. We will continue current regimen. Continue home BP monitoring. BP parameters given. F/U in 4 weeks with BP log. All questions were answered.

## 2019-11-02 NOTE — Telephone Encounter (Signed)
You are awesome Page. Thanks.

## 2019-11-02 NOTE — Progress Notes (Signed)
ccm 

## 2019-11-02 NOTE — Assessment & Plan Note (Signed)
She is doing well on current regimen. No dose adjustment needed. Continue home CBG monitoring.

## 2019-11-02 NOTE — Patient Instructions (Signed)
Zoster Vaccine, Recombinant injection What is this medicine? ZOSTER VACCINE (ZOS ter vak SEEN) is used to prevent shingles in adults 77 years old and over. This vaccine is not used to treat shingles or nerve pain from shingles. This medicine may be used for other purposes; ask your health care provider or pharmacist if you have questions. COMMON BRAND NAME(S): SHINGRIX What should I tell my health care provider before I take this medicine? They need to know if you have any of these conditions:  blood disorders or disease  cancer like leukemia or lymphoma  immune system problems or therapy  an unusual or allergic reaction to vaccines, other medications, foods, dyes, or preservatives  pregnant or trying to get pregnant  breast-feeding How should I use this medicine? This vaccine is for injection in a muscle. It is given by a health care professional. Talk to your pediatrician regarding the use of this medicine in children. This medicine is not approved for use in children. Overdosage: If you think you have taken too much of this medicine contact a poison control center or emergency room at once. NOTE: This medicine is only for you. Do not share this medicine with others. What if I miss a dose? Keep appointments for follow-up (booster) doses as directed. It is important not to miss your dose. Call your doctor or health care professional if you are unable to keep an appointment. What may interact with this medicine?  medicines that suppress your immune system  medicines to treat cancer  steroid medicines like prednisone or cortisone This list may not describe all possible interactions. Give your health care provider a list of all the medicines, herbs, non-prescription drugs, or dietary supplements you use. Also tell them if you smoke, drink alcohol, or use illegal drugs. Some items may interact with your medicine. What should I watch for while using this medicine? Visit your doctor for  regular check ups. This vaccine, like all vaccines, may not fully protect everyone. What side effects may I notice from receiving this medicine? Side effects that you should report to your doctor or health care professional as soon as possible:  allergic reactions like skin rash, itching or hives, swelling of the face, lips, or tongue  breathing problems Side effects that usually do not require medical attention (report these to your doctor or health care professional if they continue or are bothersome):  chills  headache  fever  nausea, vomiting  redness, warmth, pain, swelling or itching at site where injected  tiredness This list may not describe all possible side effects. Call your doctor for medical advice about side effects. You may report side effects to FDA at 1-800-FDA-1088. Where should I keep my medicine? This vaccine is only given in a clinic, pharmacy, doctor's office, or other health care setting and will not be stored at home. NOTE: This sheet is a summary. It may not cover all possible information. If you have questions about this medicine, talk to your doctor, pharmacist, or health care provider.  2020 Elsevier/Gold Standard (2017-07-21 13:20:30)  

## 2019-11-02 NOTE — Telephone Encounter (Signed)
Patient calls nurse line returning PCP phone call. I informed her PCP sent in Shingrix for her. I informed patient she will need to receive two doses despite received Zostavax. Patient verbalized understanding.

## 2019-11-02 NOTE — Progress Notes (Signed)
Subjective:     Patient ID: [name], female   DOB: [birth year], 78 y.o.   MRN: [id number]  HPI HTN/DM2: Here for f/u. She is compliant with Norvasc 10 mg qd, Coreg 25 mg BID, Imdur 60 mg QD and Lisinopril/HCTZ 20/12.5 mg 2 tabs daily for her BP. Her home SBP ranges from 140-150 and DBP ranges from 50 - 90. She denies chest pain, no SOB, no headache. For her DM she is compliant with Lantus 25 units qd,Metformin 1000 mg BID. Home CBG ranges from 90s to 135. She denies hypoglycemic episode. HM: Per patient, she will be due for eye exam in January. She mentioned that she got her shingles shot at [facility] many years ago and never went back for a second shot.  Current Outpatient Medications on File Prior to Visit  Medication Sig Dispense Refill  . amLODipine (NORVASC) 10 MG tablet TAKE ONE TABLET BY MOUTH DAILY (Patient taking differently: Take 10 mg by mouth once a day) 90 tablet 1  . aspirin 81 MG chewable tablet Chew 81 mg by mouth daily.      [name] atorvastatin (LIPITOR) 20 MG tablet TAKE ONE TABLET BY MOUTH DAILY AT 6PM 90 tablet 2  . Blood Glucose Monitoring Suppl (ONE TOUCH ULTRA MINI) w/Device KIT Check blood sugar TID  before meals. Diabetes Mellitus Type 2. E11.9 1 each 0  . carvedilol (COREG) 25 MG tablet TAKE ONE TABLET BY MOUTH TWICE A DAY WITH MEALS 180 tablet 2  . fluticasone (FLONASE) 50 MCG/ACT nasal spray Place 2 sprays into the nose daily as needed. For allergies (Patient not taking: Reported on 11/13/2018) 16 g 6  . Garlic 8563 MG CAPS Take 1,000 mg by mouth daily.    . Insulin Syringe-Needle U-100 (RELION INSULIN SYRINGE) 31G X 15/64" 1 ML MISC USE  ONCE DAILY AS DIRECTED 100 each 3  . isosorbide mononitrate (IMDUR) 60 MG 24 hr tablet TAKE ONE TABLET BY MOUTH DAILY 90 tablet 1  . LANTUS 100 UNIT/ML injection INJECT 25 UNITS INTO THE SKIN EVERY MORNING.  *HOLD IF GLUCOSE LEVEL IS LESS THAN 70* 10 mL 3  . lisinopril-hydrochlorothiazide (PRINZIDE,ZESTORETIC) 20-12.5 MG  tablet TAKE TWO TABLETS BY MOUTH DAILY 180 tablet 2  . loratadine (CLARITIN) 10 MG tablet Take 10 mg by mouth daily as needed for allergies.     . metFORMIN (GLUCOPHAGE-XR) 500 MG 24 hr tablet TAKE TWO TABLETS BY MOUTH TWICE A DAY 360 tablet 1  . Multiple Vitamin (MULTIVITAMIN WITH MINERALS) TABS tablet Take 1 tablet by mouth daily. Reported on 04/02/2016    . nitroGLYCERIN (NITROSTAT) 0.4 MG SL tablet Place 1 tablet (0.4 mg total) under the tongue every 5 (five) minutes as needed for chest pain. (Patient not taking: Reported on 03/05/2018) 25 tablet 3  . OneTouch Delica Lancets 14H MISC Check blood sugar TID  before meals. Diabetes Mellitus Type 2. E11.9 100 each 4  . ONETOUCH ULTRA test strip USE  STRIP TO CHECK GLUCOSE THREE TIMES DAILY BEFORE MEAL(S) 100 each 2   No current facility-administered medications on file prior to visit.    Past Medical History:  Diagnosis Date  . Aortic arch atherosclerosis ([place]) 08/29/2017  . Carpal tunnel syndrome 02/19/2007   Qualifier: Diagnosis of  By: Samara Snide    . Coronary artery disease    a. Remote PTCA of the PDA;  b. 02/2009 PCI/CBA to the D1;  c. 11/2011 Cath: LM nl, LAD 20p, 80m D1 patent, RI 20p, LCX nl,  RCA 30d, EF 55-65%.  . CTS (carpal tunnel syndrome)   . GERD (gastroesophageal reflux disease)   . 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 History of thyroid cancer   . Hypothyroid   . Nodule of right lung 08/29/2017   3 mm stable nodule right upper lobe on CTA Chest 08/28/17  . 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 Subscapular pain, left 08/29/2017   Vitals:   11/02/19 1123 11/02/19 1133 11/02/19 1134  BP: (!) 148/60 (!) 158/69 (!) 142/60  Pulse: 70    SpO2: 98%    Weight: 162 lb (73.5 kg)       Review of Systems  Respiratory: Negative.   Cardiovascular: Negative.   Gastrointestinal: Negative.   Neurological: Negative.   All other systems reviewed and are  negative.      Objective:   Physical Exam Vitals signs and nursing note reviewed.  Constitutional:      Appearance: Normal appearance.  Cardiovascular:     Rate and Rhythm: Normal rate and regular rhythm.     Heart sounds: Normal heart sounds. No murmur.  Pulmonary:     Effort: Pulmonary effort is normal. No respiratory distress.     Breath sounds: Normal breath sounds. No wheezing or rhonchi.  Abdominal:     General: Abdomen is flat. Bowel sounds are normal. There is no distension.     Palpations: Abdomen is soft. There is no mass.     Tenderness: There is no abdominal tenderness.  Neurological:     Mental Status: She is alert.        Assessment:     HTN DM2 Health maintenance    Plan:     Check problem list.  I called [facility] to confirm her Shingles shot. I spoke with [name] who confirmed that she got Zostavax in 2016. I attempted to reach her to let her know that I have escribed Shingrix and she will still need two doses and not one despite receiving Zostavax. Message left for her to call me back. Shingrix escribed. Note: Initial shingrix order for one shot with no refill was canceled and confirmed by [name].

## 2019-11-03 ENCOUNTER — Ambulatory Visit: Payer: Self-pay

## 2019-11-03 ENCOUNTER — Ambulatory Visit: Payer: Medicare Other | Admitting: Cardiovascular Disease

## 2019-11-03 ENCOUNTER — Encounter: Payer: Self-pay | Admitting: Cardiovascular Disease

## 2019-11-03 VITALS — BP 150/68 | HR 67 | Ht 63.0 in | Wt 162.0 lb

## 2019-11-03 DIAGNOSIS — I2510 Atherosclerotic heart disease of native coronary artery without angina pectoris: Secondary | ICD-10-CM

## 2019-11-03 DIAGNOSIS — I10 Essential (primary) hypertension: Secondary | ICD-10-CM

## 2019-11-03 MED ORDER — NITROGLYCERIN 0.4 MG SL SUBL: 0.4000 mg | SUBLINGUAL_TABLET | SUBLINGUAL | 3 refills | Status: DC | PRN

## 2019-11-03 NOTE — Patient Instructions (Addendum)
Visit Information  Goals Addressed            This Visit's Progress   . " I need to know what to eat in the mornings" (pt-stated)       .Current Barriers:  [name] Knowledge Deficits related to self management of food intake.   Nurse Case Manager Clinical Goal(s):  [name] Over the next 30 days, patient will verbalize understanding of plan for adding protein into her diet . Over the next 14 days, patient will work with RN Case Manager to address needs related to Food intake and how to incorporate foods that she likes into different meal plans  Interventions:  . Advised patient to add protein to her diet . Provided education to patient re: nutrition  Patient Self Care Activities:  . Self administers medications as prescribed . Attends all scheduled provider appointments . Performs ADL's independently . Performs IADL's independently . Patient is unable to self manage food intake to sustain proper nutrition  Initial goal documentation     . "Sometimes my pressure goes up and sometimes it is good." (pt-stated)       Current Barriers:  [name] Knowledge Deficits related to basic understanding of hypertension pathophysiology and self care management  Case Manager Clinical Goal(s):  [name] Over the next 60 days, patient will verbalize basic understanding of hypertension disease process and self health management plan as evidenced by the patient keeping her blood pressure at or below 140/90.  Interventions:  . Evaluation of current treatment plan related to hypertension self management and patient's adherence to plan as established by provider. . Reviewed medications with patient and discussed importance of compliance . Discussed plans with patient for ongoing care management follow up and provided patient with direct contact information for care management team  Patient Self Care Activities:  . Self administers medications as prescribed . Checks BP and records as discussed . Follows a low sodium  diet/DASH diet  Initial goal documentation         Ms. [name] was given information about Care Management services today including:  1. Care Management services include personalized support from designated clinical staff supervised by her physician, including individualized plan of care and coordination with other care providers 2. 24/7 contact phone numbers for assistance for urgent and routine care needs. 3. The patient may stop CCM services at any time (effective at the end of the month) by phone call to the office staff.  Patient agreed to services and verbal consent obtained.   The patient verbalized understanding of instructions provided today and declined a print copy of patient instruction materials.   Telephone follow up appointment with care management team member scheduled for: 14 days  The follow educational information was mailed to the patient for review  Handout Small Meals 5 Times  A Day   Lazaro Arms RN, BSN, [place] Management Coordinator [place] Phone: [phone]  Office: [phone] Fax: [phone]    Hypertension, Adult Hypertension is another name for high blood pressure. High blood pressure forces your heart to work harder to pump blood. This can cause problems over time. There are two numbers in a blood pressure reading. There is a top number (systolic) over a bottom number (diastolic). It is best to have a blood pressure that is below 120/80. Healthy choices can help lower your blood pressure, or you may need medicine to help lower it. What are the causes? The cause of this condition is not known. Some conditions  may be related to high blood pressure. What increases the risk? Smoking. Having type 2 diabetes mellitus, high cholesterol, or both. Not getting enough exercise or physical activity. Being overweight. Having too much fat, sugar, calories, or salt (sodium) in your diet. Drinking too much alcohol. Having  long-term (chronic) kidney disease. Having a family history of high blood pressure. Age. Risk increases with age. Race. You may be at higher risk if you are African American. Gender. Men are at higher risk than women before age 23. After age 61, women are at higher risk than men. Having obstructive sleep apnea. Stress. What are the signs or symptoms? High blood pressure may not cause symptoms. Very high blood pressure (hypertensive crisis) may cause: Headache. Feelings of worry or nervousness (anxiety). Shortness of breath. Nosebleed. A feeling of being sick to your stomach (nausea). Throwing up (vomiting). Changes in how you see. Very bad chest pain. Seizures. How is this treated? This condition is treated by making healthy lifestyle changes, such as: Eating healthy foods. Exercising more. Drinking less alcohol. Your health care provider may prescribe medicine if lifestyle changes are not enough to get your blood pressure under control, and if: Your top number is above 130. Your bottom number is above 80. Your personal target blood pressure may vary. Follow these instructions at home: Eating and drinking  If told, follow the DASH eating plan. To follow this plan: Fill one half of your plate at each meal with fruits and vegetables. Fill one fourth of your plate at each meal with whole grains. Whole grains include whole-wheat pasta, brown rice, and whole-grain bread. Eat or drink low-fat dairy products, such as skim milk or low-fat yogurt. Fill one fourth of your plate at each meal with low-fat (lean) proteins. Low-fat proteins include fish, chicken without skin, eggs, beans, and tofu. Avoid fatty meat, cured and processed meat, or chicken with skin. Avoid pre-made or processed food. Eat less than 1,500 mg of salt each day. Do not drink alcohol if: Your doctor tells you not to drink. You are pregnant, may be pregnant, or are planning to become pregnant. If you drink  alcohol: Limit how much you use to: 0-1 drink a day for women. 0-2 drinks a day for men. Be aware of how much alcohol is in your drink. In the [place] one drink equals one 12 oz bottle of beer (355 mL), one 5 oz glass of wine (148 mL), or one 1 oz glass of hard liquor (44 mL). Lifestyle  Work with your doctor to stay at a healthy weight or to lose weight. Ask your doctor what the best weight is for you. Get at least 30 minutes of exercise most days of the week. This may include walking, swimming, or biking. Get at least 30 minutes of exercise that strengthens your muscles (resistance exercise) at least 3 days a week. This may include lifting weights or doing Pilates. Do not use any products that contain nicotine or tobacco, such as cigarettes, e-cigarettes, and chewing tobacco. If you need help quitting, ask your doctor. Check your blood pressure at home as told by your doctor. Keep all follow-up visits as told by your doctor. This is important. Medicines Take over-the-counter and prescription medicines only as told by your doctor. Follow directions carefully. Do not skip doses of blood pressure medicine. The medicine does not work as well if you skip doses. Skipping doses also puts you at risk for problems. Ask your doctor about side effects or reactions to medicines  that you should watch for. Contact a doctor if you: Think you are having a reaction to the medicine you are taking. Have headaches that keep coming back (recurring). Feel dizzy. Have swelling in your ankles. Have trouble with your vision. Get help right away if you: Get a very bad headache. Start to feel mixed up (confused). Feel weak or numb. Feel faint. Have very bad pain in your: Chest. Belly (abdomen). Throw up more than once. Have trouble breathing. Summary Hypertension is another name for high blood pressure. High blood pressure forces your heart to work harder to pump blood. For most people, a normal blood  pressure is less than 120/80. Making healthy choices can help lower blood pressure. If your blood pressure does not get lower with healthy choices, you may need to take medicine. This information is not intended to replace advice given to you by your health care provider. Make sure you discuss any questions you have with your health care provider. Document Released: 05/27/2008 Document Revised: 08/19/2018 Document Reviewed: 08/19/2018 Elsevier Patient Education  2020 [employer].  Blood Pressure Record Sheet To take your blood pressure, you will need a blood pressure machine. You can buy a blood pressure machine (blood pressure monitor) at your clinic, drug store, or online. When choosing one, consider:  An automatic monitor that has an arm cuff.  A cuff that wraps snugly around your upper arm. You should be able to fit only one finger between your arm and the cuff.  A device that stores blood pressure reading results.  Do not choose a monitor that measures your blood pressure from your wrist or finger. Follow your health care provider's instructions for how to take your blood pressure. To use this form:  Get one reading in the morning (a.m.) before you take any medicines.  Get one reading in the evening (p.m.) before supper.  Take at least 2 readings with each blood pressure check. This makes sure the results are correct. Wait 1-2 minutes between measurements.  Write down the results in the spaces on this form.  Repeat this once a week, or as told by your health care provider.  Make a follow-up appointment with your health care provider to discuss the results. Blood pressure log Date: _______________________  a.m. _____________________(1st reading) _____________________(2nd reading)  p.m. _____________________(1st reading) _____________________(2nd reading) Date: _______________________  a.m. _____________________(1st reading) _____________________(2nd reading)  p.m.  _____________________(1st reading) _____________________(2nd reading) Date: _______________________  a.m. _____________________(1st reading) _____________________(2nd reading)  p.m. _____________________(1st reading) _____________________(2nd reading) Date: _______________________  a.m. _____________________(1st reading) _____________________(2nd reading)  p.m. _____________________(1st reading) _____________________(2nd reading) Date: _______________________  a.m. _____________________(1st reading) _____________________(2nd reading)  p.m. _____________________(1st reading) _____________________(2nd reading) This information is not intended to replace advice given to you by your health care provider. Make sure you discuss any questions you have with your health care provider. Document Released: 09/07/2003 Document Revised: 02/06/2018 Document Reviewed: 12/09/2017 Elsevier Patient Education  2020 [employer].

## 2019-11-03 NOTE — Chronic Care Management (AMB) (Signed)
Care Management   Initial Visit Note  11/03/2019 Name: [name] MRN: [id number] DOB: 05/05/1941  Subjective:   Objective:  Assessment: [name] is a 78 y.o. year old female who sees [name], MD for primary care. The care management team was consulted for assistance with care management and care coordination needs related to Disease Management .   Review of patient status, including review of consultants reports, relevant laboratory and other test results, and collaboration with appropriate care team members and the patient's provider was performed as part of comprehensive patient evaluation and provision of care management services.    SDOH (Social Determinants of Health) screening performed today: None. See Care Plan for related entries.   Advanced Directives: See Care Plan and Vynca application for related entries.   Outpatient Encounter Medications as of 11/03/2019  Medication Sig Note  . amLODipine (NORVASC) 10 MG tablet TAKE ONE TABLET BY MOUTH DAILY (Patient taking differently: Take 10 mg by mouth once a day)   . aspirin 81 MG chewable tablet Chew 81 mg by mouth daily.     [name] atorvastatin (LIPITOR) 20 MG tablet TAKE ONE TABLET BY MOUTH DAILY AT 6PM   . Blood Glucose Monitoring Suppl (ONE TOUCH ULTRA MINI) w/Device KIT Check blood sugar TID  before meals. Diabetes Mellitus Type 2. E11.9   . carvedilol (COREG) 25 MG tablet TAKE ONE TABLET BY MOUTH TWICE A DAY WITH MEALS   . Garlic 0881 MG CAPS Take 1,000 mg by mouth daily.   . Insulin Syringe-Needle U-100 (RELION INSULIN SYRINGE) 31G X 15/64" 1 ML MISC USE  ONCE DAILY AS DIRECTED   . isosorbide mononitrate (IMDUR) 60 MG 24 hr tablet TAKE ONE TABLET BY MOUTH DAILY   . LANTUS 100 UNIT/ML injection INJECT 25 UNITS INTO THE SKIN EVERY MORNING.  *HOLD IF GLUCOSE LEVEL IS LESS THAN 70*   . lisinopril-hydrochlorothiazide (PRINZIDE,ZESTORETIC) 20-12.5 MG tablet TAKE TWO TABLETS BY MOUTH DAILY   . loratadine  (CLARITIN) 10 MG tablet Take 10 mg by mouth daily as needed for allergies.    . metFORMIN (GLUCOPHAGE-XR) 500 MG 24 hr tablet TAKE TWO TABLETS BY MOUTH TWICE A DAY   . Multiple Vitamin (MULTIVITAMIN WITH MINERALS) TABS tablet Take 1 tablet by mouth daily. Reported on 04/02/2016   . OneTouch Delica Lancets 10R MISC Check blood sugar TID  before meals. Diabetes Mellitus Type 2. E11.9   . ONETOUCH ULTRA test strip USE  STRIP TO CHECK GLUCOSE THREE TIMES DAILY BEFORE MEAL(S)   . fluticasone (FLONASE) 50 MCG/ACT nasal spray Place 2 sprays into the nose daily as needed. For allergies   . [DISCONTINUED] nitroGLYCERIN (NITROSTAT) 0.4 MG SL tablet Place 1 tablet (0.4 mg total) under the tongue every 5 (five) minutes as needed for chest pain. 03/05/2018: Does not have active refill    No facility-administered encounter medications on file as of 11/03/2019.     Goals Addressed            This Visit's Progress   . " I need to know what to eat in the mornings" (pt-stated)       .Current Barriers:  [name] Knowledge Deficits related to self management of food intake.   Nurse Case Manager Clinical Goal(s):  [name] Over the next 30 days, patient will verbalize understanding of plan for adding protein into her diet . Over the next 14 days, patient will work with RN Case Freight forwarder to address needs related to Food intake and how to  incorporate foods that she likes into different meal plans  Interventions:  . Advised patient to add protein to her diet . Provided education to patient re: nutrition  Patient Self Care Activities:  . Self administers medications as prescribed . Attends all scheduled provider appointments . Performs ADL's independently . Performs IADL's independently . Patient is unable to self manage food intake to sustain proper nutrition  Initial goal documentation     . "Sometimes my pressure goes up and sometimes it is good." (pt-stated)       Current Barriers:  [name] Knowledge Deficits related to  basic understanding of hypertension pathophysiology and self care management  Case Manager Clinical Goal(s):  [name] Over the next 60 days, patient will verbalize basic understanding of hypertension disease process and self health management plan as evidenced by the patient keeping her blood pressure at or below 140/90.  Interventions:  . Evaluation of current treatment plan related to hypertension self management and patient's adherence to plan as established by provider. . Reviewed medications with patient and discussed importance of compliance . Discussed plans with patient for ongoing care management follow up and provided patient with direct contact information for care management team  Patient Self Care Activities:  . Self administers medications as prescribed . Checks BP and records as discussed . Follows a low sodium diet/DASH diet  Initial goal documentation         This SmartLink has not been configured with any valid records.   BP Readings from Last 3 Encounters:  11/03/19 (!) 150/68  11/02/19 (!) 142/60  10/20/19 (!) 175/56     Follow up plan:  The care management team will reach out to the patient again over the next 14 days.  The patient has been provided with contact information for the care management team and has been advised to call with any health related questions or concerns.   [name] was given information about Care Management services today including:  1. Care Management services include personalized support from designated clinical staff supervised by a physician, including individualized plan of care and coordination with other care providers 2. 24/7 contact phone numbers for assistance for urgent and routine care needs. 3. The patient may stop Care Management services at any time (effective at the end of the month) by phone call to the office staff.  Patient agreed to services and verbal consent obtained.  Lazaro Arms RN, BSN, [facility] Care Management  Coordinator [place] Phone: [phone]  Office: [phone] Fax: [phone]

## 2019-11-03 NOTE — Patient Instructions (Signed)
Medication Instructions:   *If you need a refill on your cardiac medications before your next appointment, please call your pharmacy*  Lab Work:  If you have labs (blood work) drawn today and your tests are completely normal, you will receive your results only by: [name] MyChart Message (if you have MyChart) OR . A paper copy in the mail If you have any lab test that is abnormal or we need to change your treatment, we will call you to review the results.  Testing/Procedures: None ordered today.  Follow-Up: At [facility], you and your health needs are our priority.  As part of our continuing mission to provide you with exceptional heart care, we have created designated Provider Care Teams.  These Care Teams include your primary Cardiologist (physician) and Advanced Practice Providers (APPs -  Physician Assistants and Nurse Practitioners) who all work together to provide you with the care you need, when you need it.  Your next appointment:   5 months  The format for your next appointment:   In Person  Provider:   You may see Dr. Johnsie Cancel or one of the following Advanced Practice Providers on your designated Care Team:    Truitt Merle, NP  Cecilie Kicks, NP  Kathyrn Drown, NP

## 2019-11-15 ENCOUNTER — Encounter: Payer: Self-pay | Admitting: Family Medicine

## 2019-11-15 ENCOUNTER — Other Ambulatory Visit: Payer: Self-pay | Admitting: Family Medicine

## 2019-11-15 ENCOUNTER — Ambulatory Visit (INDEPENDENT_AMBULATORY_CARE_PROVIDER_SITE_OTHER): Payer: Medicare Other | Admitting: Family Medicine

## 2019-11-15 ENCOUNTER — Other Ambulatory Visit: Payer: Self-pay

## 2019-11-15 DIAGNOSIS — N1831 Chronic kidney disease, stage 3a: Secondary | ICD-10-CM

## 2019-11-15 DIAGNOSIS — M19071 Primary osteoarthritis, right ankle and foot: Secondary | ICD-10-CM | POA: Diagnosis not present

## 2019-11-15 DIAGNOSIS — I10 Essential (primary) hypertension: Secondary | ICD-10-CM | POA: Diagnosis not present

## 2019-11-15 DIAGNOSIS — N1832 Chronic kidney disease, stage 3b: Secondary | ICD-10-CM | POA: Insufficient documentation

## 2019-11-15 MED ORDER — COLCHICINE 0.6 MG PO TABS: 0.6000 mg | ORAL_TABLET | Freq: Two times a day (BID) | ORAL | 1 refills | Status: DC

## 2019-11-15 NOTE — Progress Notes (Signed)
   Subjective:    Patient ID: [name], female    DOB: [birth year], 78 y.o.   MRN: [id number]  HPI  CC acute onset of right ankle pain and swelling.  78 yo female with several chronic medical problems awoke this morning with a painful, swollen right foot.  She localizes her pain the the lateral ankle.  States the pain seems to be more in the joint than on the skin.    No systemic complaints.  No left leg swelling.  Denies injury, fever, SOB, recent change in medications.  Of note, she is on HCTZ as a part of a multi drug antihypertensive regimen.    No previous acute joint problems.  No history of gout.  Review of labs, no previous uric acid drawn.    Review of Systems     Objective:   Physical Exam  Lungs clear Cardiac RRR without m or g Ext. Left leg normal Right ankle swollen and mildly red laterally.  Very painful to touch and with movement of ankle joint.  Has antalgic gait.        Assessment & Plan:

## 2019-11-15 NOTE — Assessment & Plan Note (Addendum)
History suggests gout or pseudogout.  Will check uric acid and Rx with cochicine  (No NSAID due to age and CKD.)  Called.  Lab tests suggest gout unlikely.  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  Patient states she is much improved today.

## 2019-11-15 NOTE — Patient Instructions (Signed)
I sent in a prescription to treat possible gout to [facility] I will tomorrow with the blood test results.  We will talk more. Plan on keeping your appointment with Dr. Gwendlyn Deutscher.  I am not making any changes in your blood pressure medications.

## 2019-11-15 NOTE — Assessment & Plan Note (Signed)
Sub optimal control.  Low diastolics make me worry about over treatment.  Will not change today, given high systolic pressure may be due to pain.  If clincial dx of gout is right, likely need to DC HCTZ.

## 2019-11-15 NOTE — Assessment & Plan Note (Signed)
Avoid NSAIDs  Will treat with cochicine.

## 2019-11-16 LAB — SEDIMENTATION RATE: Sed Rate: 13 mm/hr (ref 0–40)

## 2019-11-16 LAB — URIC ACID: Uric Acid: 6.1 mg/dL (ref 2.5–7.1)

## 2019-11-17 ENCOUNTER — Ambulatory Visit: Payer: Medicare Other

## 2019-11-17 NOTE — Patient Instructions (Addendum)
Visit Information  Goals Addressed            This Visit's Progress   . " I need to know what to eat in the mornings" (pt-stated)       .Current Barriers:  [name] Knowledge Deficits related to self management of food intake.   Nurse Case Manager Clinical Goal(s):  [name] Over the next 60 days, patient will verbalize understanding of plan for adding protein into her diet . Over the next 30 days, patient will work with RN Case Manager to address needs related to Food intake and how to incorporate foods that she likes into different meal plans  Interventions:  . Advised patient to add protein to her diet patient states that she has added eggs, [place] bacon . Provided education to patient re: nutrition She is now eating breakfast including grits,toast,          sausage . She has not drank any sodas in 3 days . She has increased her water intake . She received information sent on nutrition . Congratulated the patient on the improvements that she has made this far  Patient Self Care Activities:  . Self administers medications as prescribed . Attends all scheduled provider appointments . Performs ADL's independently . Performs IADL's independently . Patient is unable to self manage food intake to sustain proper nutrition  Please see past updates related to this goal by clicking on the "Past Updates" button in the selected goal      . "My blood sugars are doing good (pt-stated)       Current Barriers:  . Chronic Disease Management support, education,  related to DMII  Clinical Goal(s) related to DMII:  Over the next 90 days, patient will:  . Work with the care management team to address educational and diease management needs  . Begin or continue self health monitoring activities as directed today Measure and record cbg (blood glucose) TID times daily . Call provider office for new or worsened signs and symptoms Blood glucose findings outside established parameters . Call care management  team with questions or concerns . Verbalize basic understanding of patient centered plan of care established today  Interventions related to DMII:  . Evaluation of current treatment plans and patient's adherence to plan as established by provider . Assessed patient understanding of disease states . Assessed patient's education and care coordination needs . Provided disease specific education to patient  . Collaborated with appropriate clinical care team members regarding patient needs  Patient Self Care Activities related to DMII:  . Self administers medications as prescribed . Attends all scheduled provider appointments . Performs ADL's independently . Performs IADL's independently .  Initial goal documentation     . "Sometimes my pressure goes up and sometimes it is good." (pt-stated)       Current Barriers:  [name] Knowledge Deficits related to basic understanding of hypertension pathophysiology and self care management  Case Manager Clinical Goal(s):  [name] Over the next 60 days, patient will verbalize basic understanding of hypertension disease process and self health management plan as evidenced by the patient keeping her blood pressure at or below 140/90.  Interventions:  . Evaluation of current treatment plan related to hypertension self management and patient's adherence to plan as established by provider. . Reviewed medications with patient and discussed importance of compliance . Discussed plans with patient for ongoing care management follow up and provided patient with direct contact information for care management team . The patient has been  checking her blood pressures and recording values. 11/23 before medication in am 143/58 after medications 132/58 11/24 before medication am 167/60 after medication 138/54. [name] Patient is to continue to monitor salt in her diet . Advised if she uses vegetables in the can pour out the liquid that the vegetables are stored in and rinse them off  and then cook them.  . Patient is schedule to come in for an appointment on 11/23/19 and will bring in a record of her values.  Patient Self Care Activities:  . Self administers medications as prescribed . Checks BP and records as discussed . Follows a low sodium diet/DASH diet  Please see past updates related to this goal by clicking on the "Past Updates" button in the selected goal           1. Care Management services include personalized support from designated clinical staff supervised by her physician, including individualized plan of care and coordination with other care providers 2. 24/7 contact phone numbers for assistance for urgent and routine care needs. 3. The patient may stop CCM services at any time (effective at the end of the month) by phone call to the office staff.  Patient agreed to services and verbal consent obtained.   The patient verbalized understanding of instructions provided today and declined a print copy of patient instruction materials.   The care management team will reach out to the patient again over the next 14 days.  The patient has been provided with contact information for the care management team and has been advised to call with any health related questions or concerns.   Lazaro Arms RN, BSN, [facility] Care Management Coordinator [place] Phone: [phone] I Office: [phone] Fax: [phone]

## 2019-11-17 NOTE — Chronic Care Management (AMB) (Signed)
Care Management   Follow Up Note   11/17/2019 Name: [name] MRN: [id number] DOB: 10/05/1941  Referred by: [name], MD Reason for referral : Care Coordination (Care Management F/U)   [name] is a 78 y.o. year old female who is a primary care patient of [name], MD. The care management team was consulted for assistance with care management and care coordination needs.    Review of patient status, including review of consultants reports, relevant laboratory and other test results, and collaboration with appropriate care team members and the patient's provider was performed as part of comprehensive patient evaluation and provision of chronic care management services.    SDOH (Social Determinants of Health) screening performed today: None. See Care Plan for related entries.   Advanced Directives: See Care Plan and Vynca application for related entries.   Goals Addressed            This Visit's Progress   . " I need to know what to eat in the mornings" (pt-stated)       .Current Barriers:  [name] Knowledge Deficits related to self management of food intake.   Nurse Case Manager Clinical Goal(s):  [name] Over the next 60 days, patient will verbalize understanding of plan for adding protein into her diet . Over the next 30 days, patient will work with RN Case Manager to address needs related to Food intake and how to incorporate foods that she likes into different meal plans  Interventions:  . Advised patient to add protein to her diet patient states that she has added eggs, [place] bacon . Provided education to patient re: nutrition She is now eating breakfast including grits,toast,          sausage . She has not drank any sodas in 3 days . She has increased her water intake . She received information sent on nutrition . Congratulated the patient on the improvements that she has made this far  Patient Self Care Activities:  . Self administers  medications as prescribed . Attends all scheduled provider appointments . Performs ADL's independently . Performs IADL's independently . Patient is unable to self manage food intake to sustain proper nutrition  Please see past updates related to this goal by clicking on the "Past Updates" button in the selected goal      . "My blood sugars are doing good (pt-stated)       Current Barriers:  . Chronic Disease Management support, education,  related to DMII  Clinical Goal(s) related to DMII:  Over the next 90 days, patient will:  . Work with the care management team to address educational and diease management needs  . Begin or continue self health monitoring activities as directed today Measure and record cbg (blood glucose) TID times daily . Call provider office for new or worsened signs and symptoms Blood glucose findings outside established parameters . Call care management team with questions or concerns . Verbalize basic understanding of patient centered plan of care established today  Interventions related to DMII:  . Evaluation of current treatment plans and patient's adherence to plan as established by provider . Assessed patient understanding of disease states . Assessed patient's education and care coordination needs . Provided disease specific education to patient  . Collaborated with appropriate clinical care team members regarding patient needs  Patient Self Care Activities related to DMII:  . Self administers medications as prescribed . Attends all scheduled provider appointments . Performs ADL's independently .  Performs IADL's independently .  Initial goal documentation     . "Sometimes my pressure goes up and sometimes it is good." (pt-stated)       Current Barriers:  [name] Knowledge Deficits related to basic understanding of hypertension pathophysiology and self care management  Case Manager Clinical Goal(s):  [name] Over the next 60 days, patient will verbalize basic  understanding of hypertension disease process and self health management plan as evidenced by the patient keeping her blood pressure at or below 140/90.  Interventions:  . Evaluation of current treatment plan related to hypertension self management and patient's adherence to plan as established by provider. . Reviewed medications with patient and discussed importance of compliance . Discussed plans with patient for ongoing care management follow up and provided patient with direct contact information for care management team . The patient has been checking her blood pressures and recording values. 11/23 before medication in am 143/58 after medications 132/58 11/24 before medication am 167/60 after medication 138/54. [name] Patient is to continue to monitor salt in her diet . Advised if she uses vegetables in the can pour out the liquid that the vegetables are stored in and rinse them off and then cook them.  . Patient is schedule to come in for an appointment on 11/23/19 and will bring in a record of her values.  Patient Self Care Activities:  . Self administers medications as prescribed . Checks BP and records as discussed . Follows a low sodium diet/DASH diet  Please see past updates related to this goal by clicking on the "Past Updates" button in the selected goal           BP Readings from Last 3 Encounters:  11/15/19 (!) 162/60  11/03/19 (!) 150/68  11/02/19 (!) 142/60   Lab Results  Component Value Date   HGBA1C 6.6 09/28/2019   Wt Readings from Last 3 Encounters:  11/03/19 162 lb (73.5 kg)  11/02/19 162 lb (73.5 kg)  10/20/19 159 lb 14.4 oz (72.5 kg)      The care management team will reach out to the patient again over the next 14 days.  The patient has been provided with contact information for the care management team and has been advised to call with any health related questions or concerns.   Lazaro Arms RN, BSN, [facility] Care Management Coordinator [place] Phone: [phone] I Office: [phone] Fax: [phone]

## 2019-11-23 ENCOUNTER — Ambulatory Visit (INDEPENDENT_AMBULATORY_CARE_PROVIDER_SITE_OTHER): Payer: Medicare Other | Admitting: Family Medicine

## 2019-11-23 ENCOUNTER — Encounter: Payer: Self-pay | Admitting: Family Medicine

## 2019-11-23 ENCOUNTER — Other Ambulatory Visit: Payer: Self-pay

## 2019-11-23 DIAGNOSIS — I10 Essential (primary) hypertension: Secondary | ICD-10-CM | POA: Diagnosis not present

## 2019-11-23 MED ORDER — AMLODIPINE BESYLATE 5 MG PO TABS: 5.0000 mg | ORAL_TABLET | Freq: Every day | ORAL | 1 refills | Status: DC

## 2019-11-23 NOTE — Progress Notes (Deleted)
Subjective:     Patient ID: [name], female   DOB: August 26, 1941, 78 y.o.   MRN: [id number]  HPI HTN: Patient is here for f/u. She is currently on Coreg 25 mg BID, Imdur 60 mg qd, Prinzide 20/12.5 mg 2 tab daily. She denies being on Norvasc although on her med list.  Current Outpatient Medications on File Prior to Visit  Medication Sig Dispense Refill  . aspirin 81 MG chewable tablet Chew 81 mg by mouth daily.      [name] atorvastatin (LIPITOR) 20 MG tablet TAKE ONE TABLET BY MOUTH DAILY AT 6PM 90 tablet 2  . carvedilol (COREG) 25 MG tablet TAKE ONE TABLET BY MOUTH TWICE A DAY WITH MEALS 180 tablet 2  . Garlic 3570 MG CAPS Take 1,000 mg by mouth daily.    . isosorbide mononitrate (IMDUR) 60 MG 24 hr tablet TAKE ONE TABLET BY MOUTH DAILY 90 tablet 1  . LANTUS 100 UNIT/ML injection INJECT 25 UNITS INTO THE SKIN EVERY MORNING.  *HOLD IF GLUCOSE LEVEL IS LESS THAN 70* 10 mL 3  . lisinopril-hydrochlorothiazide (PRINZIDE,ZESTORETIC) 20-12.5 MG tablet TAKE TWO TABLETS BY MOUTH DAILY 180 tablet 2  . loratadine (CLARITIN) 10 MG tablet Take 10 mg by mouth daily as needed for allergies.     . metFORMIN (GLUCOPHAGE-XR) 500 MG 24 hr tablet TAKE TWO TABLETS BY MOUTH TWICE A DAY 360 tablet 3  . Multiple Vitamin (MULTIVITAMIN WITH MINERALS) TABS tablet Take 1 tablet by mouth daily. Reported on 04/02/2016    . amLODipine (NORVASC) 10 MG tablet TAKE ONE TABLET BY MOUTH DAILY (Patient taking differently: Take 10 mg by mouth once a day) 90 tablet 1  . Blood Glucose Monitoring Suppl (ONE TOUCH ULTRA MINI) w/Device KIT Check blood sugar TID  before meals. Diabetes Mellitus Type 2. E11.9 1 each 0  . fluticasone (FLONASE) 50 MCG/ACT nasal spray Place 2 sprays into the nose daily as needed. For allergies 16 g 6  . Insulin Syringe-Needle U-100 (RELION INSULIN SYRINGE) 31G X 15/64" 1 ML MISC USE  ONCE DAILY AS DIRECTED 100 each 3  . nitroGLYCERIN (NITROSTAT) 0.4 MG SL tablet Place 1 tablet (0.4 mg total) under the  tongue every 5 (five) minutes as needed for chest pain. (Patient not taking: Reported on 11/23/2019) 25 tablet 3  . OneTouch Delica Lancets 17B MISC Check blood sugar TID  before meals. Diabetes Mellitus Type 2. E11.9 100 each 4  . ONETOUCH ULTRA test strip USE  STRIP TO CHECK GLUCOSE THREE TIMES DAILY BEFORE MEAL(S) 100 each 2   No current facility-administered medications on file prior to visit.    Past Medical History:  Diagnosis Date  . Aortic arch atherosclerosis ([place]) 08/29/2017  . Carpal tunnel syndrome 02/19/2007   Qualifier: Diagnosis of  By: Samara Snide    . Coronary artery disease    a. Remote PTCA of the PDA;  b. 02/2009 PCI/CBA to the D1;  c. 11/2011 Cath: LM nl, LAD 20p, 77m D1 patent, RI 20p, LCX nl, RCA 30d, EF 55-65%.  . CTS (carpal tunnel syndrome)   . GERD (gastroesophageal reflux disease)   . 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 History of thyroid cancer   . Hypothyroid   . Nodule of right lung 08/29/2017   3 mm stable nodule right upper lobe on CTA Chest 08/28/17  . 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 Subscapular pain, left 08/29/2017  Vitals:   11/23/19 1125 11/23/19 1146 11/23/19 1148  BP: 134/60 (!) 152/70 (!) 148/60  Pulse: 74    SpO2: 95%       Review of Systems  Respiratory: Negative.   Cardiovascular: Negative.   Gastrointestinal: Negative.   Neurological: Negative.   All other systems reviewed and are negative.        Objective:   Physical Exam Vitals signs and nursing note reviewed.  Constitutional:      Appearance: She is not ill-appearing.  Cardiovascular:     Rate and Rhythm: Normal rate and regular rhythm.     Heart sounds: Normal heart sounds. No murmur.  Pulmonary:     Effort: Pulmonary effort is normal. No respiratory distress.     Breath sounds: Normal breath sounds. No stridor. No wheezing or rhonchi.  Abdominal:     General: Abdomen is flat. Bowel sounds  are normal. There is no distension.     Palpations: Abdomen is soft. There is no mass.     Tenderness: There is no abdominal tenderness. There is no guarding.  Musculoskeletal:     Right lower leg: No edema.     Left lower leg: No edema.  Neurological:     Mental Status: She is alert.        Assessment:     HTN    Plan:     Check problem list.

## 2019-11-23 NOTE — Assessment & Plan Note (Signed)
BP still up. Home readings reviewed. Upon review of her meds, it appears that she has not had her Norvasc in a while. I contacted her Pharmacy and they don't have it on her list. She did not come with her med bottles today. I called her later after visit to check on her bottles and she said she does not have it. No documentation of allergies to Norvasc. I will restart her at a lower dose of 5 mg qd and have her return to me in 2 weeks. Continue home BP monitoring.

## 2019-11-23 NOTE — Patient Instructions (Signed)

## 2019-11-23 NOTE — Progress Notes (Signed)
Patient ID: [name], female   DOB: 06-25-1941, 78 y.o.   MRN: [id number]  HPI HTN: Patient is here for f/u. She is currently on Coreg 25 mg BID, Imdur 60 mg qd, Prinzide 20/12.5 mg 2 tab daily. She denies being on Norvasc although on her med list.  Current Outpatient Medications on File Prior to Visit  Medication Sig Dispense Refill  . aspirin 81 MG chewable tablet Chew 81 mg by mouth daily.      [name] atorvastatin (LIPITOR) 20 MG tablet TAKE ONE TABLET BY MOUTH DAILY AT 6PM 90 tablet 2  . carvedilol (COREG) 25 MG tablet TAKE ONE TABLET BY MOUTH TWICE A DAY WITH MEALS 180 tablet 2  . Garlic 4818 MG CAPS Take 1,000 mg by mouth daily.    . isosorbide mononitrate (IMDUR) 60 MG 24 hr tablet TAKE ONE TABLET BY MOUTH DAILY 90 tablet 1  . LANTUS 100 UNIT/ML injection INJECT 25 UNITS INTO THE SKIN EVERY MORNING.  *HOLD IF GLUCOSE LEVEL IS LESS THAN 70* 10 mL 3  . lisinopril-hydrochlorothiazide (PRINZIDE,ZESTORETIC) 20-12.5 MG tablet TAKE TWO TABLETS BY MOUTH DAILY 180 tablet 2  . loratadine (CLARITIN) 10 MG tablet Take 10 mg by mouth daily as needed for allergies.     . metFORMIN (GLUCOPHAGE-XR) 500 MG 24 hr tablet TAKE TWO TABLETS BY MOUTH TWICE A DAY 360 tablet 3  . Multiple Vitamin (MULTIVITAMIN WITH MINERALS) TABS tablet Take 1 tablet by mouth daily. Reported on 04/02/2016    . amLODipine (NORVASC) 10 MG tablet TAKE ONE TABLET BY MOUTH DAILY (Patient taking differently: Take 10 mg by mouth once a day) 90 tablet 1  . Blood Glucose Monitoring Suppl (ONE TOUCH ULTRA MINI) w/Device KIT Check blood sugar TID  before meals. Diabetes Mellitus Type 2. E11.9 1 each 0  . fluticasone (FLONASE) 50 MCG/ACT nasal spray Place 2 sprays into the nose daily as needed. For allergies 16 g 6  . Insulin Syringe-Needle U-100 (RELION INSULIN SYRINGE) 31G X 15/64" 1 ML MISC USE  ONCE DAILY AS DIRECTED 100 each 3  . nitroGLYCERIN (NITROSTAT) 0.4 MG SL tablet Place 1 tablet (0.4 mg total) under the tongue every 5 (five)  minutes as needed for chest pain. (Patient not taking: Reported on 11/23/2019) 25 tablet 3  . OneTouch Delica Lancets 56D MISC Check blood sugar TID  before meals. Diabetes Mellitus Type 2. E11.9 100 each 4  . ONETOUCH ULTRA test strip USE  STRIP TO CHECK GLUCOSE THREE TIMES DAILY BEFORE MEAL(S) 100 each 2   No current facility-administered medications on file prior to visit.    Past Medical History:  Diagnosis Date  . Aortic arch atherosclerosis ([place]) 08/29/2017  . Carpal tunnel syndrome 02/19/2007   Qualifier: Diagnosis of  By: Samara Snide    . Coronary artery disease    a. Remote PTCA of the PDA;  b. 02/2009 PCI/CBA to the D1;  c. 11/2011 Cath: LM nl, LAD 20p, 45m D1 patent, RI 20p, LCX nl, RCA 30d, EF 55-65%.  . CTS (carpal tunnel syndrome)   . GERD (gastroesophageal reflux disease)   . 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 History of thyroid cancer   . Hypothyroid   . Nodule of right lung 08/29/2017   3 mm stable nodule right upper lobe on CTA Chest 08/28/17  . 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 Subscapular pain, left 08/29/2017   Vitals:   11/23/19 1125  11/23/19 1146 11/23/19 1148  BP: 134/60 (!) 152/70 (!) 148/60  Pulse: 74    SpO2: 95%       Review of Systems  Respiratory: Negative.   Cardiovascular: Negative.   Gastrointestinal: Negative.   Neurological: Negative.   All other systems reviewed and are negative.        Objective:   Physical Exam Vitals signs and nursing note reviewed.  Constitutional:      Appearance: She is not ill-appearing.  Cardiovascular:     Rate and Rhythm: Normal rate and regular rhythm.     Heart sounds: Normal heart sounds. No murmur.  Pulmonary:     Effort: Pulmonary effort is normal. No respiratory distress.     Breath sounds: Normal breath sounds. No stridor. No wheezing or rhonchi.  Abdominal:     General: Abdomen is flat. Bowel sounds are normal. There is no  distension.     Palpations: Abdomen is soft. There is no mass.     Tenderness: There is no abdominal tenderness. There is no guarding.  Musculoskeletal:     Right lower leg: No edema.     Left lower leg: No edema.  Neurological:     Mental Status: She is alert.        Assessment:     HTN    Plan:     Check problem list.

## 2019-11-26 ENCOUNTER — Other Ambulatory Visit: Payer: Self-pay | Admitting: Family Medicine

## 2019-12-06 ENCOUNTER — Encounter: Payer: Self-pay | Admitting: Family Medicine

## 2019-12-06 ENCOUNTER — Ambulatory Visit: Payer: Medicare Other | Admitting: Family Medicine

## 2019-12-06 ENCOUNTER — Other Ambulatory Visit: Payer: Self-pay

## 2019-12-06 DIAGNOSIS — I10 Essential (primary) hypertension: Secondary | ICD-10-CM | POA: Diagnosis not present

## 2019-12-06 NOTE — Progress Notes (Addendum)
     Subjective:     Patient ID: [name], female   DOB: [birth year], 78 y.o.   MRN: [id number]  HPI HTN: here for f/u. She is compliant with Norvasc 5 mg qd, Coreg 25 mg qd, Lisinopril.HCTZ 20/12.5 mg 2 tab daily, Imdur 60 mg qd. Denies any chest pain, headache, blurry vision or SOB.  Review of Systems  Respiratory: Negative.   Cardiovascular: Negative.   Gastrointestinal: Negative.   Neurological: Negative.   All other systems reviewed and are negative.  Vitals:   12/06/19 0834 12/06/19 0852  BP: (!) 150/64 138/60  Pulse: 75   SpO2: 99%         Objective:   Physical Exam Vitals and nursing note reviewed.  Constitutional:      Appearance: She is not ill-appearing.  Cardiovascular:     Rate and Rhythm: Normal rate and regular rhythm.     Pulses: Normal pulses.     Heart sounds: No murmur.  Pulmonary:     Effort: Pulmonary effort is normal. No respiratory distress.     Breath sounds: Normal breath sounds. No rhonchi.  Abdominal:     General: Abdomen is flat. Bowel sounds are normal. There is no distension.     Palpations: Abdomen is soft.     Tenderness: There is no abdominal tenderness. There is no guarding.  Musculoskeletal:     Right lower leg: No edema.     Left lower leg: No edema.  Neurological:     Mental Status: She is alert.        Assessment:     HTN    Plan:     Check problem list

## 2019-12-06 NOTE — Assessment & Plan Note (Signed)
BP improved. Continue current regimen. F/U in 3-4 weeks.

## 2019-12-06 NOTE — Patient Instructions (Signed)

## 2019-12-22 ENCOUNTER — Ambulatory Visit: Payer: Medicare Other

## 2019-12-22 NOTE — Patient Instructions (Signed)
Visit Information  Goals Addressed            This Visit's Progress   . " I need to know what to eat in the mornings" (pt-stated)   On track    .Current Barriers:  [name] Knowledge Deficits related to self management of food intake.   Nurse Case Manager Clinical Goal(s):  [name] Over the next 60 days, patient will verbalize understanding of plan for adding protein into her diet . Over the next 30 days, patient will work with RN Case Manager to address needs related to Food intake and how to incorporate foods that she likes into different meal plans  Interventions:  . Advised patient to add protein to her diet patient states that she has added eggs, [place] bacon added oatmeal . Provided education to patient re: nutrition She is now eating breakfast including grits,toast,          sausage . She has increased her water intake . She received information sent on nutrition . Congratulated the patient on the improvements   Patient Self Care Activities:  . Self administers medications as prescribed . Attends all scheduled provider appointments . Performs ADL's independently . Performs IADL's independently . Patient is unable to self manage food intake to sustain proper nutrition  Please see past updates related to this goal by clicking on the "Past Updates" button in the selected goal      . "My blood sugars are doing good (pt-stated)   On track    Current Barriers:  . Chronic Disease Management support, education,  related to DMII  Clinical Goal(s) related to DMII:  Over the next 90 days, patient will:  . Work with the care management team to address educational and diease management needs  . Begin or continue self health monitoring activities as directed today Measure and record cbg (blood glucose) TID times daily . Call provider office for new or worsened signs and symptoms Blood glucose findings outside established parameters . Call care management team with questions or  concerns . Verbalize basic understanding of patient centered plan of care established today  Interventions related to DMII:  . Evaluation of current treatment plans and patient's adherence to plan as established by provider . Assessed patient understanding of disease states . Assessed patient's education and care coordination needs . Provided disease specific education to patient  . Collaborated with appropriate clinical care team members regarding patient needs . Blood sugar today was 138 . Continue medications as prescibed  Patient Self Care Activities related to DMII:  . Self administers medications as prescribed . Attends all scheduled provider appointments . Performs ADL's independently . Performs IADL's independently .  Initial goal documentation     . "Sometimes my pressure goes up and sometimes it is good." (pt-stated)   On track    Current Barriers:  [name] Knowledge Deficits related to basic understanding of hypertension pathophysiology and self care management  Case Manager Clinical Goal(s):  [name] Over the next 60 days, patient will verbalize basic understanding of hypertension disease process and self health management plan as evidenced by the patient keeping her blood pressure at or below 140/90.  Interventions:  . Evaluation of current treatment plan related to hypertension self management and patient's adherence to plan as established by provider. . Reviewed medications with patient and discussed importance of compliance . Discussed plans with patient for ongoing care management follow up and provided patient with direct contact information for care management team . The patient has  been checking her blood pressures and recording values. Bid  as prescribed by physician  bp this am 149/77 hr 70 after medication 133/77 hr 77 .  Patient is to continue to monitor salt in her diet . Advised if she uses vegetables in the can pour out the liquid that the vegetables are stored in and  rinse them off and then cook them.  . Congratulated her on her continued efforts.  Patient Self Care Activities:  . Self administers medications as prescribed . Checks BP and records as discussed . Follows a low sodium diet/DASH diet  Please see past updates related to this goal by clicking on the "Past Updates" button in the selected goal          Ms. Mathey was given information about Care Management services today including:  1. Care Management services include personalized support from designated clinical staff supervised by her physician, including individualized plan of care and coordination with other care providers 2. 24/7 contact phone numbers for assistance for urgent and routine care needs. 3. The patient may stop CCM services at any time (effective at the end of the month) by phone call to the office staff.  Patient agreed to services and verbal consent obtained.   The patient verbalized understanding of instructions provided today and declined a print copy of patient instruction materials.   The care management team will reach out to the patient again over the next 30 days.  The patient has been provided with contact information for the care management team and has been advised to call with any health related questions or concerns.   Lazaro Arms RN, BSN, [facility] Care Management Coordinator [place] Phone: [phone] Office: [phone] Fax: [phone]

## 2019-12-22 NOTE — Chronic Care Management (AMB) (Signed)
Care Management   Follow Up Note   12/22/2019 Name: CARMALETA ZEIN MRN: [id number] DOB: 1941-03-02  Referred by: [name], MD Reason for referral : Care Coordination (Care Management RNCM DM II HTN)   [name] is a 78 y.o. year old female who is a primary care patient of [name], MD. The care management team was consulted for assistance with care management and care coordination needs.    Review of patient status, including review of consultants reports, relevant laboratory and other test results, and collaboration with appropriate care team members and the patient's provider was performed as part of comprehensive patient evaluation and provision of chronic care management services.    SDOH (Social Determinants of Health) screening performed today: None. See Care Plan for related entries.   Advanced Directives: See Care Plan and Vyn[employer] application for related entries.   Goals Addressed            This Visit's Progress   . " I need to know what to eat in the mornings" (pt-stated)   On track    .Current Barriers:  [name] Knowledge Deficits related to self management of food intake.   Nurse Case Manager Clinical Goal(s):  [name] Over the next 60 days, patient will verbalize understanding of plan for adding protein into her diet . Over the next 30 days, patient will work with RN Case Manager to address needs related to Food intake and how to incorporate foods that she likes into different meal plans  Interventions:  . Advised patient to add protein to her diet patient states that she has added eggs, [place] bacon added oatmeal . Provided education to patient re: nutrition She is now eating breakfast including grits,toast,          sausage . She has increased her water intake . She received information sent on nutrition . Congratulated the patient on the improvements   Patient Self Care Activities:  . Self administers medications as prescribed . Attends all  scheduled provider appointments . Performs ADL's independently . Performs IADL's independently . Patient is unable to self manage food intake to sustain proper nutrition  Please see past updates related to this goal by clicking on the "Past Updates" button in the selected goal      . "My blood sugars are doing good (pt-stated)   On track    Current Barriers:  . Chronic Disease Management support, education,  related to DMII  Clinical Goal(s) related to DMII:  Over the next 90 days, patient will:  . Work with the care management team to address educational and diease management needs  . Begin or continue self health monitoring activities as directed today Measure and record cbg (blood glucose) TID times daily . Call provider office for new or worsened signs and symptoms Blood glucose findings outside established parameters . Call care management team with questions or concerns . Verbalize basic understanding of patient centered plan of care established today  Interventions related to DMII:  . Evaluation of current treatment plans and patient's adherence to plan as established by provider . Assessed patient understanding of disease states . Assessed patient's education and care coordination needs . Provided disease specific education to patient  . Collaborated with appropriate clinical care team members regarding patient needs . Blood sugar today was 138 . Continue medications as prescibed  Patient Self Care Activities related to DMII:  . Self administers medications as prescribed . Attends all scheduled provider appointments . Performs  ADL's independently . Performs IADL's independently .  Initial goal documentation     . "Sometimes my pressure goes up and sometimes it is good." (pt-stated)   On track    Current Barriers:  [name] Knowledge Deficits related to basic understanding of hypertension pathophysiology and self care management  Case Manager Clinical Goal(s):  [name] Over the  next 60 days, patient will verbalize basic understanding of hypertension disease process and self health management plan as evidenced by the patient keeping her blood pressure at or below 140/90.  Interventions:  . Evaluation of current treatment plan related to hypertension self management and patient's adherence to plan as established by provider. . Reviewed medications with patient and discussed importance of compliance . Discussed plans with patient for ongoing care management follow up and provided patient with direct contact information for care management team . The patient has been checking her blood pressures and recording values. Bid  as prescribed by physician  bp this am 149/77 hr 70 after medication 133/77 hr 77 .  Patient is to continue to monitor salt in her diet . Advised if she uses vegetables in the can pour out the liquid that the vegetables are stored in and rinse them off and then cook them.  . Congratulated her on her continued efforts.  Patient Self Care Activities:  . Self administers medications as prescribed . Checks BP and records as discussed . Follows a low sodium diet/DASH diet  Please see past updates related to this goal by clicking on the "Past Updates" button in the selected goal           The care management team will reach out to the patient again over the next 30 days.  The patient has been provided with contact information for the care management team and has been advised to call with any health related questions or concerns.   Lazaro Arms RN, BSN, [facility] Care Management Coordinator [place] Phone: [phone] Office: [phone] Fax: [phone]

## 2019-12-27 ENCOUNTER — Encounter: Payer: Self-pay | Admitting: Family Medicine

## 2019-12-28 ENCOUNTER — Other Ambulatory Visit: Payer: Self-pay

## 2019-12-28 ENCOUNTER — Encounter: Payer: Self-pay | Admitting: Family Medicine

## 2019-12-28 ENCOUNTER — Ambulatory Visit (INDEPENDENT_AMBULATORY_CARE_PROVIDER_SITE_OTHER): Payer: Medicare Other | Admitting: Family Medicine

## 2019-12-28 VITALS — BP 134/60 | HR 63

## 2019-12-28 DIAGNOSIS — E119 Type 2 diabetes mellitus without complications: Secondary | ICD-10-CM | POA: Diagnosis not present

## 2019-12-28 DIAGNOSIS — I10 Essential (primary) hypertension: Secondary | ICD-10-CM

## 2019-12-28 DIAGNOSIS — E7800 Pure hypercholesterolemia, unspecified: Secondary | ICD-10-CM

## 2019-12-28 DIAGNOSIS — N1831 Chronic kidney disease, stage 3a: Secondary | ICD-10-CM

## 2019-12-28 LAB — POCT GLYCOSYLATED HEMOGLOBIN (HGB A1C): HbA1c, POC (controlled diabetic range): 7 % (ref 0.0–7.0)

## 2019-12-28 NOTE — Patient Instructions (Addendum)
It was nice seeing you today. Your A1C worsened a bit. However, you are still within goal. Please let us keep an eye on your diet and take your medicine regularly. Continue current dose of your blood pressure medications. I will like to see you back in 3 months.

## 2019-12-28 NOTE — Assessment & Plan Note (Signed)
Lipitor 20 mg qd. Continue same.

## 2019-12-28 NOTE — Assessment & Plan Note (Signed)
A1C worsened a bit, but still within goal. Diet and med compliance discussed. Continue current regimen. F/U in 3 months.

## 2019-12-28 NOTE — Assessment & Plan Note (Signed)
I don't think she quite met the diagnosis of CKD. Recheck kidney functions today. She has not questions at this point. I will contact her with results.

## 2019-12-28 NOTE — Assessment & Plan Note (Addendum)
Stable on her current regimen. Home BP report, reviewed and it looks good. Continue same: Norvasc 5 mg qd, Coreg 25 mg BID, Imdur XL 60 mg qd and Prinzide, 20/12.5 mg 2 tabs qd. Monitor closely at home.

## 2019-12-28 NOTE — Progress Notes (Signed)
     Subjective:     Patient ID: [name], female   DOB: 09-Sep-1941, 79 y.o.   MRN: [id number]  HPI DM2: Here for f/u. She stated on few occasions, she missed her insulin dose. She is currently on Lantus 25 units qq and Metformin 1000 mg BID. Denies any concerns. [id number] with meds. Here for f/u. She came in with her home BP report. [id number] with meds. Here for f/u. CKD: Documented on her recent visit to a different provider. Here for follow-up. No GU symptoms.  Vitals:   12/28/19 1112 12/28/19 1122  BP: (!) 146/62 134/60  Pulse: 63   SpO2: 98%      Review of Systems  Respiratory: Negative.   Cardiovascular: Negative for chest pain, palpitations and leg swelling.  Gastrointestinal: Negative.   Genitourinary: Negative.   Neurological: Negative.   All other systems reviewed and are negative.      Objective:   Physical Exam Vitals and nursing note reviewed.  Cardiovascular:     Rate and Rhythm: Normal rate and regular rhythm.     Heart sounds: Normal heart sounds. No murmur.  Pulmonary:     Effort: Pulmonary effort is normal. No respiratory distress.     Breath sounds: Normal breath sounds. No stridor. No wheezing or rhonchi.  Abdominal:     General: Abdomen is flat. Bowel sounds are normal. There is no distension.     Palpations: Abdomen is soft. There is no mass.     Tenderness: There is no abdominal tenderness.  Musculoskeletal:     Cervical back: Normal range of motion.     Right lower leg: No edema.     Left lower leg: No edema.  Neurological:     Mental Status: She is alert.        Assessment:     DM2 HTN HLD CKD    Plan:     Check problem list.

## 2019-12-29 ENCOUNTER — Telehealth: Payer: Self-pay | Admitting: Family Medicine

## 2019-12-29 LAB — CMP14+EGFR
ALT: 14 IU/L (ref 0–32)
AST: 13 IU/L (ref 0–40)
Albumin/Globulin Ratio: 2.1 (ref 1.2–2.2)
Albumin: 4.7 g/dL (ref 3.7–4.7)
Alkaline Phosphatase: 86 IU/L (ref 39–117)
BUN/Creatinine Ratio: 20 (ref 12–28)
BUN: 21 mg/dL (ref 8–27)
Bilirubin Total: 0.6 mg/dL (ref 0.0–1.2)
CO2: 26 mmol/L (ref 20–29)
Calcium: 10.1 mg/dL (ref 8.7–10.3)
Chloride: 102 mmol/L (ref 96–106)
Creatinine, Ser: 1.07 mg/dL — ABNORMAL HIGH (ref 0.57–1.00)
GFR calc Af Amer: 58 mL/min/{1.73_m2} — ABNORMAL LOW (ref >59)
GFR calc non Af Amer: 50 mL/min/{1.73_m2} — ABNORMAL LOW (ref >59)
Globulin, Total: 2.2 g/dL (ref 1.5–4.5)
Glucose: 197 mg/dL — ABNORMAL HIGH (ref 65–99)
Potassium: 4.4 mmol/L (ref 3.5–5.2)
Sodium: 141 mmol/L (ref 134–144)
Total Protein: 6.9 g/dL (ref 6.0–8.5)

## 2019-12-29 NOTE — Telephone Encounter (Signed)
Discussed Cmet result with her. Has CKD stage 3a with GFR of 38. I discussed BP and Glucose control. Consider [facility] referral in the future if this worsens. She agreed with the plan.

## 2020-01-11 ENCOUNTER — Other Ambulatory Visit: Payer: Self-pay | Admitting: Family Medicine

## 2020-01-11 DIAGNOSIS — Z1231 Encounter for screening mammogram for malignant neoplasm of breast: Secondary | ICD-10-CM

## 2020-01-13 ENCOUNTER — Other Ambulatory Visit: Payer: Self-pay

## 2020-01-13 ENCOUNTER — Ambulatory Visit: Payer: Medicare PPO

## 2020-01-13 NOTE — Chronic Care Management (AMB) (Signed)
Care Management   Follow Up Note   01/13/2020 Name: [name] MRN: [id number] DOB: 02/24/1941  Referred by: [name], MD Reason for referral : Care Coordination (Care Management RNCM HTN/ DMII)   [name] is a 79 y.o. year old female who is a primary care patient of [name], MD. The care management team was consulted for assistance with care management and care coordination needs.    Review of patient status, including review of consultants reports, relevant laboratory and other test results, and collaboration with appropriate care team members and the patient's provider was performed as part of comprehensive patient evaluation and provision of chronic care management services.    SDOH (Social Determinants of Health) screening performed today: None. See Care Plan for related entries.   Advanced Directives: See Care Plan and Vynca application for related entries.   Goals Addressed            This Visit's Progress   . "My blood sugars are doing good (pt-stated)       Current Barriers:  . Chronic Disease Management support, education,  related to DMII  Clinical Goal(s) related to DMII:  Over the next 90 days, patient will:  . Work with the care management team to address educational and diease management needs  . Begin or continue self health monitoring activities as directed today Measure and record cbg (blood glucose) TID times daily . Call provider office for new or worsened signs and symptoms Blood glucose findings outside established parameters . Call care management team with questions or concerns . Verbalize basic understanding of patient centered plan of care established today  Interventions related to DMII:  . Evaluation of current treatment plans and patient's adherence to plan as established by provider . Assessed patient understanding of disease states . Assessed patient's education and care coordination needs . Provided disease  specific education to patient  . Collaborated with appropriate clinical care team members regarding patient needs . Blood sugar today was 138 . Continue medications as prescribed  . 01/13/20 . Patient is checking her blood sugars daily. . Blood sugar this morning 95, other numbers 135, 140 . She is still learning with foods taking one day at a time. . Drinking water . Will call care management team with questions or concerns    Patient Self Care Activities related to DMII:  . Self administers medications as prescribed . Attends all scheduled provider appointments . Performs ADL's independently . Performs IADL's independently .  Initial goal documentation     . "Sometimes my pressure goes up and sometimes it is good." (pt-stated)       Current Barriers:  [name] Knowledge Deficits related to basic understanding of hypertension pathophysiology and self care management  Case Manager Clinical Goal(s):  [name] Over the next 60 days, patient will verbalize basic understanding of hypertension disease process and self health management plan as evidenced by the patient keeping her blood pressure at or below 140/90.  Interventions:  . Evaluation of current treatment plan related to hypertension self management and patient's adherence to plan as established by provider. . Reviewed medications with patient and discussed importance of compliance . Discussed plans with patient for ongoing care management follow up and provided patient with direct contact information for care management team . The patient has been checking her blood pressures and recording values. Bid  as prescribed by physician  bp this am 149/77 hr 70 after medication 133/77 hr 77 .  Patient  is to continue to monitor salt in her diet . Advised if she uses vegetables in the can pour out the liquid that the vegetables are stored in and rinse them off and then cook them.  . Congratulated her on her continued efforts.  . 01/13/20 . Patient  is checking her BP before medication and after medications.  Before 148/69 hr 81 147/56 hr 76 After 137/72 hr 76 120/74 hr 80 . She is monitoring the sodium in her diet .  Her and her daughter are working together with meals and drinking water . She is taking her medications as prescribed  Patient Self Care Activities:  . Self administers medications as prescribed . Checks BP and records as discussed . Follows a low sodium diet/DASH diet  Please see past updates related to this goal by clicking on the "Past Updates" button in the selected goal           The care management team will reach out to the patient again over the next 14 days.  The patient has been provided with contact information for the care management team and has been advised to call with any health related questions or concerns.   Lazaro Arms RN, BSN, [facility] Care Management Coordinator [place] Phone: [phone] Fax: [phone]

## 2020-01-13 NOTE — Patient Instructions (Signed)
Visit Information  Goals Addressed            This Visit's Progress   . "My blood sugars are doing good (pt-stated)       Current Barriers:  . Chronic Disease Management support, education,  related to DMII  Clinical Goal(s) related to DMII:  Over the next 90 days, patient will:  . Work with the care management team to address educational and diease management needs  . Begin or continue self health monitoring activities as directed today Measure and record cbg (blood glucose) TID times daily . Call provider office for new or worsened signs and symptoms Blood glucose findings outside established parameters . Call care management team with questions or concerns . Verbalize basic understanding of patient centered plan of care established today  Interventions related to DMII:  . Evaluation of current treatment plans and patient's adherence to plan as established by provider . Assessed patient understanding of disease states . Assessed patient's education and care coordination needs . Provided disease specific education to patient  . Collaborated with appropriate clinical care team members regarding patient needs . Blood sugar today was 138 . Continue medications as prescribed  . 01/13/20 . Patient is checking her blood sugars daily. . Blood sugar this morning 95, other numbers 135, 140 . She is still learning with foods taking one day at a time. . Drinking water . Will call care management team with questions or concerns    Patient Self Care Activities related to DMII:  . Self administers medications as prescribed . Attends all scheduled provider appointments . Performs ADL's independently . Performs IADL's independently .  Initial goal documentation     . "Sometimes my pressure goes up and sometimes it is good." (pt-stated)       Current Barriers:  [name] Knowledge Deficits related to basic understanding of hypertension pathophysiology and self care management  Case Manager  Clinical Goal(s):  [name] Over the next 60 days, patient will verbalize basic understanding of hypertension disease process and self health management plan as evidenced by the patient keeping her blood pressure at or below 140/90.  Interventions:  . Evaluation of current treatment plan related to hypertension self management and patient's adherence to plan as established by provider. . Reviewed medications with patient and discussed importance of compliance . Discussed plans with patient for ongoing care management follow up and provided patient with direct contact information for care management team . The patient has been checking her blood pressures and recording values. Bid  as prescribed by physician  bp this am 149/77 hr 70 after medication 133/77 hr 77 .  Patient is to continue to monitor salt in her diet . Advised if she uses vegetables in the can pour out the liquid that the vegetables are stored in and rinse them off and then cook them.  . Congratulated her on her continued efforts.  . 01/13/20 . Patient is checking her BP before medication and after medications.  Before 148/69 hr 81 147/56 hr 76 After 137/72 hr 76 120/74 hr 80 . She is monitoring the sodium in her diet .  Her and her daughter are working together with meals and drinking water . She is taking her medications as prescribed  Patient Self Care Activities:  . Self administers medications as prescribed . Checks BP and records as discussed . Follows a low sodium diet/DASH diet  Please see past updates related to this goal by clicking on the "Past Updates" button in the  selected goal          [name] was given information about Care Management services today including:  1. Care Management services include personalized support from designated clinical staff supervised by her physician, including individualized plan of care and coordination with other care providers 2. 24/7 contact phone numbers for assistance for  urgent and routine care needs. 3. The patient may stop CCM services at any time (effective at the end of the month) by phone call to the office staff.  Patient agreed to services and verbal consent obtained.   The patient verbalized understanding of instructions provided today and declined a print copy of patient instruction materials.   The care management team will reach out to the patient again over the next 14 days.  The patient has been provided with contact information for the care management team and has been advised to call with any health related questions or concerns.   Lazaro Arms RN, BSN, [facility] Care Management Coordinator [place] Phone: [phone] Fax: [phone]

## 2020-01-27 ENCOUNTER — Ambulatory Visit (INDEPENDENT_AMBULATORY_CARE_PROVIDER_SITE_OTHER): Payer: Medicare PPO | Admitting: Family Medicine

## 2020-01-27 ENCOUNTER — Other Ambulatory Visit: Payer: Self-pay

## 2020-01-27 ENCOUNTER — Ambulatory Visit (HOSPITAL_COMMUNITY)
Admission: RE | Admit: 2020-01-27 | Discharge: 2020-01-27 | Disposition: A | Discharge status: Home / Self Care | Payer: Medicare PPO | Source: Ambulatory Visit | Attending: Family Medicine | Admitting: Family Medicine

## 2020-01-27 VITALS — BP 134/62 | HR 77 | Wt 161.0 lb

## 2020-01-27 DIAGNOSIS — M5489 Other dorsalgia: Secondary | ICD-10-CM

## 2020-01-27 DIAGNOSIS — R9431 Abnormal electrocardiogram [ECG] [EKG]: Secondary | ICD-10-CM | POA: Insufficient documentation

## 2020-01-27 DIAGNOSIS — M25512 Pain in left shoulder: Secondary | ICD-10-CM | POA: Insufficient documentation

## 2020-01-27 DIAGNOSIS — Z961 Presence of intraocular lens: Secondary | ICD-10-CM | POA: Diagnosis not present

## 2020-01-27 DIAGNOSIS — S46912A Strain of unspecified muscle, fascia and tendon at shoulder and upper arm level, left arm, initial encounter: Secondary | ICD-10-CM | POA: Insufficient documentation

## 2020-01-27 DIAGNOSIS — E119 Type 2 diabetes mellitus without complications: Secondary | ICD-10-CM | POA: Diagnosis not present

## 2020-01-27 DIAGNOSIS — Z794 Long term (current) use of insulin: Secondary | ICD-10-CM | POA: Diagnosis not present

## 2020-01-27 LAB — HM DIABETES EYE EXAM

## 2020-01-27 NOTE — Assessment & Plan Note (Addendum)
EKG obtained in clinic with no new changes when compared to September 2019 EKG, little concern for MI at this time given history and EKG. Left sided scapular soreness most likely due to muscle strain from her picking-up heavy groceries.  Patient also with palpable knot over paraspinal muscles c/w likely muscle strain.  Patient reassured -Since tylenol and heating pad help alleviate her symptoms we agreed to continue this plan with the addition of as needed diclofenac gel.  -Patient given return precautions if she experiences shortness of breath or chest pain.  -Strict return precautions discussed, patient voiced understanding

## 2020-01-27 NOTE — Progress Notes (Signed)
   CHIEF COMPLAINT / HPI:  Left scapular soreness  - Details waking up around 5:30 am 02/03 with pain in the region of her left scapula. She described the pain as "sore and achy." The pain does not inhibit her activities of daily living. Her pain does not radiate to her jaw or upper extremities  - Tylenol and heating pads help alleviate her symptoms. She cannot think of anything that makes her pain worse - Prior to tylenol and heating pads she quantified her pain as 7/10. Following treatment she quantified her pain as 5/10.  - Denies recent trauma to the area or exercising. She does describe picking-up and moving heavy groceries to her home on 02/02  -Patient reports that her previous heart attacks have both presented with back pain and so she wanted to come into the office in order to determine that she is not currently having heart attack. - She denies fevers, chills, diaphoresis headaches, dizziness, shortness of breath, chest pain, chest palpitations or abdominal pain   PERTINENT  PMH / PSH: HTN, cor pulmonale, anemia, dysphagia   OBJECTIVE: BP 134/62   Pulse 77   Wt 161 lb (73 kg)   LMP 11/24/1992   SpO2 97%   BMI 28.52 kg/m   Physical Exam  Constitutional: She is well-developed, well-nourished, and in no distress. No distress.  Cardiovascular: Normal rate, regular rhythm and normal heart sounds. Exam reveals no gallop and no friction rub.  No murmur heard. Pulmonary/Chest: Effort normal and breath sounds normal. No respiratory distress. She has no wheezes. She has no rales.  Abdominal: Soft. There is no abdominal tenderness.  Skin: She is not diaphoretic.  Extremities: Visible myofascial trigger release point around the region of patient left scapula. Tenderness to palpation over left scapula consistent with muscle strain. 5/5 bilateral upper extremity strength. Sensation intact. ROM intact.   ASSESSMENT / PLAN:  Muscle strain of scapular region, left, initial encounter EKG  obtained in clinic with no new changes when compared to September 2019 EKG, little concern for MI at this time given history and EKG. Left sided scapular soreness most likely due to muscle strain from her picking-up heavy groceries.  Patient also with palpable knot over paraspinal muscles c/w likely muscle strain.  Patient reassured -Since tylenol and heating pad help alleviate her symptoms we agreed to continue this plan with the addition of as needed diclofenac gel.  -Patient given return precautions if she experiences shortness of breath or chest pain.  -Strict return precautions discussed, patient voiced understanding   Maudry Diego, [place]  I have personally seen and examined this patient with the medical student and agree with the above note.  I have made any adjustments needed to the above note.    Martinique Shirley, DO PGY-3, [facility]

## 2020-01-27 NOTE — Patient Instructions (Addendum)
Thank you for coming to see me today. It was a pleasure! Today we talked about:   For your back pain we recommend that you use diclofenac gel.  I have sent this to your pharmacy but if it is not covered by your insurance and it is also available over-the-counter.  Please apply this to the area on your back concerning.  You may continue to use Tylenol as needed.  If you develop any chest pain, shortness of breath or worsening back pain and do not hesitate to go to the emergency room, urgent care or call our office during office hours.   If the Tylenol, diclofenac gel and heating pad are not helping with the pain then you may consider using naproxen (Aleve) 1 tablet twice daily.   Please schedule an eye exam soon as you are able.  Please follow-up as needed.  If you have any questions or concerns, please do not hesitate to call the office at [phone].  Take Care,   Martinique Matthe Sloane, DO

## 2020-01-29 ENCOUNTER — Other Ambulatory Visit: Payer: Self-pay | Admitting: Family Medicine

## 2020-02-17 ENCOUNTER — Telehealth: Payer: Self-pay

## 2020-02-17 NOTE — Telephone Encounter (Signed)
I called the patient and confirmed she wanted her glucometer script faxed to [facility]. Hand written script completed and placed in the front office for faxing.

## 2020-02-17 NOTE — Telephone Encounter (Signed)
Pt LVM on nurse line asking Dr. Gwendlyn Deutscher to send [facility] a request for a new meter and lancets. Please give pt a call and let her know when this has been done. Ottis Stain, CMA

## 2020-02-18 ENCOUNTER — Ambulatory Visit
Admission: RE | Admit: 2020-02-18 | Discharge: 2020-02-18 | Disposition: A | Discharge status: Home / Self Care | Payer: Medicare PPO | Source: Ambulatory Visit | Attending: Family Medicine | Admitting: Family Medicine

## 2020-02-18 ENCOUNTER — Other Ambulatory Visit: Payer: Self-pay

## 2020-02-18 DIAGNOSIS — Z1231 Encounter for screening mammogram for malignant neoplasm of breast: Secondary | ICD-10-CM | POA: Diagnosis not present

## 2020-03-20 ENCOUNTER — Other Ambulatory Visit: Payer: Self-pay | Admitting: Family Medicine

## 2020-03-20 ENCOUNTER — Telehealth: Payer: Self-pay | Admitting: *Deleted

## 2020-03-20 MED ORDER — ACCU-CHEK GUIDE W/DEVICE KIT: 1.0000 | PACK | Freq: Three times a day (TID) | 0 refills | Status: DC

## 2020-03-20 MED ORDER — ACCU-CHEK GUIDE VI STRP: ORAL_STRIP | 12 refills | Status: DC

## 2020-03-20 MED ORDER — ACCU-CHEK SOFTCLIX LANCETS MISC: 12 refills | Status: DC

## 2020-03-20 NOTE — Telephone Encounter (Signed)
Glucometer reordered.

## 2020-03-20 NOTE — Telephone Encounter (Signed)
Pt calls because she is unable to pick up the test strips that we sent in because "they will cost too much and I have never paid for them before"  Called [facility], it seems that Accu-check is the preferred machine for [employer] (that is the reading they are receiving when they run the one touch delica's)  Will forward to PCP to send in new Accucheck meter and teset strips, hopefully this will be covered at no cost.   Dr. Gwendlyn Deutscher,  Please be sure to have specific instructions and a Dx code.  Please send [facility] [place]  Also, pt reports not taking her insulin in 2 days because she was "scared to since she could not check her sugar"

## 2020-03-21 NOTE — Telephone Encounter (Signed)
See refill encounter.  Kwali Wrinkle,CMA

## 2020-04-07 NOTE — Progress Notes (Addendum)
Virtual Visit via Telephone Note   This visit type was conducted due to national recommendations for restrictions regarding the COVID-19 Pandemic (e.g. social distancing) in an effort to limit this patient's exposure and mitigate transmission in our community.  Due to her co-morbid illnesses, this patient is at least at moderate risk for complications without adequate follow up.  This format is felt to be most appropriate for this patient at this time.  The patient did not have access to video technology/had technical difficulties with video requiring transitioning to audio format only (telephone).  All issues noted in this document were discussed and addressed.  No physical exam could be performed with this format.  Please refer to the patient's chart for her  consent to telehealth for [facility].   The patient was identified using 2 identifiers.  Date:  04/10/2020   ID:  [name], DOB 1941-11-15, MRN [id number]  Patient Location: Home Provider Location: Home  PCP:  [name], MD  Cardiologist:  Jenkins Rouge, MD  Electrophysiologist:  None   Evaluation Performed:  Follow-Up Visit  Chief Complaint:  6 month follow up   History of Present Illness:    [name] is a 79 y.o. female with a hx of CAD s/p PTCA to PDA in 2001 and cutting balloon to diagonal in 2010. Last cath 11/2011 with non-obstructive disease. Also with a hx of HTN, DM2, HLD and chronic anemia for which she follows with Dr. Lindi Adie at the [facility] for intermittent IV iron infusions.    [name] was last seen 11/03/2019 in follow-up and was doing well from a CV standpoint with no angina.  No changes were made  Today she is being seen via telemedicine visit and reports she is doing very well from a CV standpoint.  She follows very closely with her PCP who has been monitoring her BP in the outpatient setting.  Apparently, her BP has been fluctuating more lately however is very stable  today.  There is room for up titration of medications if needed however given her reading today we will keep things the same.  Reviewed increased exercise, low-sodium diet for which she follows both.  She denies chest pain, shortness of breath, increased fatigue, palpitations or dizziness.  Has follow-up with her hematologist this week for full lab work.  Overall is doing very well.  Last LDL, 51 on 01/19/2019. Last hemoglobin A1c 7.0 on 12/28/2019.  Last hemoglobin 9.7 on 10/20/2019.  The patient does not have symptoms concerning for COVID-19 infection (fever, chills, cough, or new shortness of breath).   Past Medical History:  Diagnosis Date  . Aortic arch atherosclerosis ([place]) 08/29/2017  . Carpal tunnel syndrome 02/19/2007   Qualifier: Diagnosis of  By: Samara Snide    . Coronary artery disease    a. Remote PTCA of the PDA;  b. 02/2009 PCI/CBA to the D1;  c. 11/2011 Cath: LM nl, LAD 20p, 30m D1 patent, RI 20p, LCX nl, RCA 30d, EF 55-65%.  . CTS (carpal tunnel syndrome)   . GERD (gastroesophageal reflux disease)   . 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 History of thyroid cancer   . Hypothyroid   . IBS (irritable bowel syndrome) 12/27/2014  . Nodule of right lung 08/29/2017   3 mm stable nodule right upper lobe on CTA Chest 08/28/17  . 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 Subscapular pain, left 08/29/2017  Past Surgical History:  Procedure Laterality Date  . ANGIOPLASTY     PDA 90-40 %  . BARTHOLIN GLAND CYST EXCISION  09-13-2004   Keratosis- no CA  . BREAST BIOPSY     LEFT SPOT REMOVED  . BREAST EXCISIONAL BIOPSY Left   . CARDIAC CATHETERIZATION  03/15/2009   stenosis of diagonal branch of LAD- PTCA performed  . CHOLECYSTECTOMY  08-23-2002  . CORONARY ANGIOPLASTY    . ENDOMETRIAL BIOPSY  02-20-1998  . LEFT HEART CATHETERIZATION WITH CORONARY ANGIOGRAM N/A 11/26/2011   Procedure: LEFT HEART CATHETERIZATION WITH CORONARY  ANGIOGRAM;  Surgeon: Peter M Martinique, MD;  Location: [facility] CATH LAB;  Service: Cardiovascular;  Laterality: N/A;  . PTCA  10/23/2000 and 03/15/09  . 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 TONSILLECTOMY    . TUBAL LIGATION       Current Meds  Medication Sig  . Accu-Chek Softclix Lancets lancets Use to check capillary glucose TID  . amLODipine (NORVASC) 5 MG tablet Take 1 tablet (5 mg total) by mouth daily.  [name] aspirin 81 MG chewable tablet Chew 81 mg by mouth daily.    [name] atorvastatin (LIPITOR) 20 MG tablet TAKE ONE TABLET BY MOUTH DAILY AT 6PM  . Blood Glucose Monitoring Suppl (ACCU-CHEK GUIDE) w/Device KIT 1 kit by Does not apply route in the morning, at noon, and at bedtime.  . carvedilol (COREG) 25 MG tablet TAKE ONE TABLET BY MOUTH TWICE A DAY WITH MEALS  . glucose blood (ACCU-CHEK GUIDE) test strip Use to check capillary glucose TID  . Insulin Syringe-Needle U-100 (RELION INSULIN SYRINGE) 31G X 15/64" 1 ML MISC USE  ONCE DAILY AS DIRECTED  . isosorbide mononitrate (IMDUR) 60 MG 24 hr tablet TAKE ONE TABLET BY MOUTH DAILY  . LANTUS 100 UNIT/ML injection INJECT 25 UNITS INTO THE SKIN EVERY MORNING AS DIRECTED. HOLD IF BLOOD GLUCOSE IS LESS THAN 70.  . lisinopril-hydrochlorothiazide (PRINZIDE,ZESTORETIC) 20-12.5 MG tablet TAKE TWO TABLETS BY MOUTH DAILY  . loratadine (CLARITIN) 10 MG tablet Take 10 mg by mouth daily as needed for allergies.   . metFORMIN (GLUCOPHAGE-XR) 500 MG 24 hr tablet TAKE TWO TABLETS BY MOUTH TWICE A DAY  . Multiple Vitamin (MULTIVITAMIN WITH MINERALS) TABS tablet Take 1 tablet by mouth daily. Reported on 04/02/2016  . nitroGLYCERIN (NITROSTAT) 0.4 MG SL tablet Place 1 tablet (0.4 mg total) under the tongue every 5 (five) minutes as needed for chest pain.     Allergies:   Codeine, Sulfamethoxazole-trimethoprim, and Tramadol   Social History   Tobacco Use  . Smoking status: Former Smoker    Types: Cigarettes    Quit date: 12/23/1980     Years since quitting: 39.3  . Smokeless tobacco: Former Systems developer    Types: [place] date: 08/23/1994  Substance Use Topics  . Alcohol use: No  . Drug use: No     Family Hx: The patient's family history includes Breast cancer in her daughter; Diabetes in her mother; Diabetes Mellitus II in her brother and brother; Epilepsy in her brother; Heart attack in her brother, brother, and sister; Hypertension in her mother; Other in her father.  ROS:   Please see the history of present illness.     All other systems reviewed and are negative.  Prior CV studies:   The following studies were reviewed today: None   Labs/Other Tests and Data Reviewed:    EKG:  No ECG reviewed.  Recent Labs: 10/20/2019: Hemoglobin  9.7; Platelet Count 132 12/28/2019: ALT 14; BUN 21; Creatinine, Ser 1.07; Potassium 4.4; Sodium 141   Recent Lipid Panel Lab Results  Component Value Date/Time   CHOL 120 01/19/2019 12:02 PM   TRIG 131 01/19/2019 12:02 PM   HDL 43 01/19/2019 12:02 PM   CHOLHDL 2.8 01/19/2019 12:02 PM   CHOLHDL 2.2 08/06/2016 11:35 AM   LDLCALC 51 01/19/2019 12:02 PM   LDLDIRECT 76 02/06/2011 11:34 AM    Wt Readings from Last 3 Encounters:  04/10/20 152 lb (68.9 kg)  01/27/20 161 lb (73 kg)  11/03/19 162 lb (73.5 kg)     Objective:    Vital Signs:  BP 130/81   Pulse 71   Ht _0  (1.6 m)   Wt 152 lb (68.9 kg)   LMP 11/24/1992   BMI 26.93 kg/m    VITAL SIGNS:  reviewed GEN:  no acute distress NEURO:  alert and oriented x 3, no obvious focal deficit PSYCH:  normal affect  ASSESSMENT & PLAN:    1. CAD: -Remote hx of PCI to PDA/Diagonal with last cath showing non-obstructive disease in 2012.  -Denies anginal symptoms -Continue ASA, statin, BB  2. HLD: -Last LDL, 51 on 01/19/19 -Continue statin   3. DM2:  -Last HbA1c, 7.0 on 12/28/2019 -Target HbA1c <6.5 -Continue current regimen   4. Anemia: -Hb, 9.7 on 10/20/2019 -Appears to be chronic>>follows with Dr. [facility]i [facility] at the  [facility]  -Has appointment this week for full lab work and follow up   5. HTN: -Stable, 130/81 -Continue current regimen  -No changes needed at this time   COVID-19 Education: The signs and symptoms of COVID-19 were discussed with the patient and how to seek care for testing (follow up with PCP or arrange E-visit). The importance of social distancing was discussed today.  Time:   Today, I have spent 15 minutes with the patient with telehealth technology discussing the above problems.     Medication Adjustments/Labs and Tests Ordered: Current medicines are reviewed at length with the patient today.  Concerns regarding medicines are outlined above.   Tests Ordered: No orders of the defined types were placed in this encounter.   Medication Changes: No orders of the defined types were placed in this encounter.   Follow Up:  Either In Person or Virtual Dr. Johnsie Cancel or myself   Signed, Kathyrn Drown, NP  04/10/2020 9:55 AM    [place]

## 2020-04-10 ENCOUNTER — Other Ambulatory Visit: Payer: Self-pay

## 2020-04-10 ENCOUNTER — Telehealth (INDEPENDENT_AMBULATORY_CARE_PROVIDER_SITE_OTHER): Payer: Medicare PPO | Admitting: Cardiology

## 2020-04-10 ENCOUNTER — Encounter: Payer: Self-pay | Admitting: Cardiology

## 2020-04-10 VITALS — BP 130/81 | HR 71 | Ht 63.0 in | Wt 152.0 lb

## 2020-04-10 DIAGNOSIS — Z87891 Personal history of nicotine dependence: Secondary | ICD-10-CM | POA: Diagnosis not present

## 2020-04-10 DIAGNOSIS — E7800 Pure hypercholesterolemia, unspecified: Secondary | ICD-10-CM | POA: Diagnosis not present

## 2020-04-10 DIAGNOSIS — I10 Essential (primary) hypertension: Secondary | ICD-10-CM

## 2020-04-10 DIAGNOSIS — I2510 Atherosclerotic heart disease of native coronary artery without angina pectoris: Secondary | ICD-10-CM

## 2020-04-10 DIAGNOSIS — Z9861 Coronary angioplasty status: Secondary | ICD-10-CM | POA: Diagnosis not present

## 2020-04-10 DIAGNOSIS — E119 Type 2 diabetes mellitus without complications: Secondary | ICD-10-CM

## 2020-04-10 DIAGNOSIS — Z7189 Other specified counseling: Secondary | ICD-10-CM | POA: Diagnosis not present

## 2020-04-10 NOTE — Patient Instructions (Signed)
Medication Instructions:   Your physician recommends that you continue on your current medications as directed. Please refer to the Current Medication list given to you today.  *If you need a refill on your cardiac medications before your next appointment, please call your pharmacy*  Lab Work:  None ordered today  Testing/Procedures:  None ordered today  Follow-Up: At [facility], you and your health needs are our priority.  As part of our continuing mission to provide you with exceptional heart care, we have created designated Provider Care Teams.  These Care Teams include your primary Cardiologist (physician) and Advanced Practice Providers (APPs -  Physician Assistants and Nurse Practitioners) who all work together to provide you with the care you need, when you need it.  We recommend signing up for the patient portal called "MyChart".  Sign up information is provided on this After Visit Summary.  MyChart is used to connect with patients for Virtual Visits (Telemedicine).  Patients are able to view lab/test results, encounter notes, upcoming appointments, etc.  Non-urgent messages can be sent to your provider as well.   To learn more about what you can do with MyChart, go to [email or web address].    Your next appointment:   6 month(s)  The format for your next appointment:   In Person  Provider:    You may see Jenkins Rouge, MD or Kathyrn Drown, NP

## 2020-04-13 ENCOUNTER — Other Ambulatory Visit: Payer: Self-pay | Admitting: Family Medicine

## 2020-04-14 ENCOUNTER — Other Ambulatory Visit: Payer: Self-pay

## 2020-04-14 ENCOUNTER — Inpatient Hospital Stay: Payer: Medicare PPO | Attending: Hematology and Oncology

## 2020-04-14 DIAGNOSIS — D509 Iron deficiency anemia, unspecified: Secondary | ICD-10-CM | POA: Insufficient documentation

## 2020-04-14 DIAGNOSIS — Z79899 Other long term (current) drug therapy: Secondary | ICD-10-CM | POA: Diagnosis not present

## 2020-04-14 DIAGNOSIS — N289 Disorder of kidney and ureter, unspecified: Secondary | ICD-10-CM | POA: Diagnosis not present

## 2020-04-14 DIAGNOSIS — D508 Other iron deficiency anemias: Secondary | ICD-10-CM

## 2020-04-14 LAB — CMP (CANCER CENTER ONLY)
ALT: 16 U/L (ref 0–44)
AST: 16 U/L (ref 15–41)
Albumin: 3.9 g/dL (ref 3.5–5.0)
Alkaline Phosphatase: 79 U/L (ref 38–126)
Anion gap: 10 (ref 5–15)
BUN: 16 mg/dL (ref 8–23)
CO2: 31 mmol/L (ref 22–32)
Calcium: 11.3 mg/dL — ABNORMAL HIGH (ref 8.9–10.3)
Chloride: 104 mmol/L (ref 98–111)
Creatinine: 1.04 mg/dL — ABNORMAL HIGH (ref 0.44–1.00)
GFR, Est AFR Am: >60 mL/min (ref >60)
GFR, Estimated: 52 mL/min — ABNORMAL LOW (ref >60)
Glucose, Bld: 88 mg/dL (ref 70–99)
Potassium: 4.2 mmol/L (ref 3.5–5.1)
Sodium: 145 mmol/L (ref 135–145)
Total Bilirubin: 0.7 mg/dL (ref 0.3–1.2)
Total Protein: 7.4 g/dL (ref 6.5–8.1)

## 2020-04-14 LAB — CBC WITH DIFFERENTIAL (CANCER CENTER ONLY)
Abs Immature Granulocytes: 0.01 10*3/uL (ref 0.00–0.07)
Basophils Absolute: 0.1 10*3/uL (ref 0.0–0.1)
Basophils Relative: 1 %
Eosinophils Absolute: 0.3 10*3/uL (ref 0.0–0.5)
Eosinophils Relative: 5 %
HCT: 32.3 % — ABNORMAL LOW (ref 36.0–46.0)
Hemoglobin: 9.9 g/dL — ABNORMAL LOW (ref 12.0–15.0)
Immature Granulocytes: 0 %
Lymphocytes Relative: 36 %
Lymphs Abs: 2 10*3/uL (ref 0.7–4.0)
MCH: 27.5 pg (ref 26.0–34.0)
MCHC: 30.7 g/dL (ref 30.0–36.0)
MCV: 89.7 fL (ref 80.0–100.0)
Monocytes Absolute: 0.6 10*3/uL (ref 0.1–1.0)
Monocytes Relative: 12 %
Neutro Abs: 2.5 10*3/uL (ref 1.7–7.7)
Neutrophils Relative %: 46 %
Platelet Count: 131 10*3/uL — ABNORMAL LOW (ref 150–400)
RBC: 3.6 MIL/uL — ABNORMAL LOW (ref 3.87–5.11)
RDW: 14.9 % (ref 11.5–15.5)
WBC Count: 5.4 10*3/uL (ref 4.0–10.5)
nRBC: 0 % (ref 0.0–0.2)

## 2020-04-14 LAB — IRON AND TIBC
Iron: 79 ug/dL (ref 41–142)
Saturation Ratios: 26 % (ref 21–57)
TIBC: 308 ug/dL (ref 236–444)
UIBC: 229 ug/dL (ref 120–384)

## 2020-04-14 LAB — FERRITIN: Ferritin: 18 ng/mL (ref 11–307)

## 2020-04-14 LAB — VITAMIN B12: Vitamin B-12: 527 pg/mL (ref 180–914)

## 2020-04-14 LAB — FOLATE: Folate: 38.3 ng/mL (ref >5.9)

## 2020-04-17 NOTE — Progress Notes (Signed)
 Patient Care Team: Eniola, Kehinde T, MD as PCP - General (Family Medicine) Nishan, Peter C, MD as PCP - Cardiology (Cardiology) Coles, Traci, RN as Case Manager  DIAGNOSIS:    ICD-10-CM   1. Other iron deficiency anemia  D50.8     CHIEF COMPLIANT: Follow-up of anemia   INTERVAL HISTORY: [name] is a 78 y.o. with above-mentioned history of anemia previously treated with oral and IV iron. Labs on 04/14/20 showed: Hg 9.9, HCT 32.3, platelets 131, creatinine 1.04, iron saturation 26%, ferritin 18, B-12 527, folate 38.3. She presents to the clinic today for follow-up.   ALLERGIES:  is allergic to codeine; sulfamethoxazole-trimethoprim; and tramadol.  MEDICATIONS:  Current Outpatient Medications  Medication Sig Dispense Refill  . Accu-Chek Softclix Lancets lancets Use to check capillary glucose TID 100 each 12  . amLODipine (NORVASC) 5 MG tablet Take 1 tablet (5 mg total) by mouth daily. 90 tablet 1  . aspirin 81 MG chewable tablet Chew 81 mg by mouth daily.      . atorvastatin (LIPITOR) 20 MG tablet TAKE ONE TABLET BY MOUTH DAILY AT 6PM 90 tablet 2  . Blood Glucose Monitoring Suppl (ACCU-CHEK GUIDE) w/Device KIT 1 kit by Does not apply route in the morning, at noon, and at bedtime. 1 kit 0  . carvedilol (COREG) 25 MG tablet TAKE ONE TABLET BY MOUTH TWICE A DAY WITH MEALS 180 tablet 1  . glucose blood (ACCU-CHEK GUIDE) test strip Use to check capillary glucose TID 100 each 12  . Insulin Syringe-Needle U-100 (RELION INSULIN SYRINGE) 31G X 15/64" 1 ML MISC USE  ONCE DAILY AS DIRECTED 100 each 3  . isosorbide mononitrate (IMDUR) 60 MG 24 hr tablet TAKE ONE TABLET BY MOUTH DAILY 90 tablet 1  . LANTUS 100 UNIT/ML injection INJECT 25 UNITS INTO THE SKIN EVERY MORNING AS DIRECTED. HOLD IF BLOOD GLUCOSE IS LESS THAN 70. 10 mL 2  . lisinopril-hydrochlorothiazide (ZESTORETIC) 20-12.5 MG tablet TAKE TWO TABLETS BY MOUTH DAILY 180 tablet 1  . loratadine (CLARITIN) 10 MG tablet Take 10 mg by  mouth daily as needed for allergies.     . metFORMIN (GLUCOPHAGE-XR) 500 MG 24 hr tablet TAKE TWO TABLETS BY MOUTH TWICE A DAY 360 tablet 3  . Multiple Vitamin (MULTIVITAMIN WITH MINERALS) TABS tablet Take 1 tablet by mouth daily. Reported on 04/02/2016    . nitroGLYCERIN (NITROSTAT) 0.4 MG SL tablet Place 1 tablet (0.4 mg total) under the tongue every 5 (five) minutes as needed for chest pain. 25 tablet 3   No current facility-administered medications for this visit.    PHYSICAL EXAMINATION: ECOG PERFORMANCE STATUS: 1 - Symptomatic but completely ambulatory  There were no vitals filed for this visit. There were no vitals filed for this visit.  LABORATORY DATA:  I have reviewed the data as listed CMP Latest Ref Rng & Units 04/14/2020 12/28/2019 10/20/2019  Glucose 70 - 99 mg/dL 88 197(H) 99  BUN 8 - 23 mg/dL 16 21 17  Creatinine 0.44 - 1.00 mg/dL 1.04(H) 1.07(H) 1.13(H)  Sodium 135 - 145 mmol/L 145 141 146(H)  Potassium 3.5 - 5.1 mmol/L 4.2 4.4 4.1  Chloride 98 - 111 mmol/L 104 102 105  CO2 22 - 32 mmol/L 31 26 29  Calcium 8.9 - 10.3 mg/dL 11.3(H) 10.1 9.8  Total Protein 6.5 - 8.1 g/dL 7.4 6.9 7.1  Total Bilirubin 0.3 - 1.2 mg/dL 0.7 0.6 0.6  Alkaline Phos 38 - 126 U/L 79 86 78  AST   15 - 41 U/L 16 13 14(L)  ALT 0 - 44 U/L 16 14 14    Lab Results  Component Value Date   WBC 5.4 04/14/2020   HGB 9.9 (L) 04/14/2020   HCT 32.3 (L) 04/14/2020   MCV 89.7 04/14/2020   PLT 131 (L) 04/14/2020   NEUTROABS 2.5 04/14/2020    ASSESSMENT & PLAN:  Iron deficiency anemia January 19, 2019 hemoglobin 9.3 platelets 126,000.  February 09, 2019 hemoglobin 9.6 platelets 132,000Ferritin 23. Folate was normal B12 was normal. The patient reports history of diverticulitis.and  hemorrhoids. Last colonoscopy was 07/02/2013 and showed diverticuli as well as hemorrhoids. 04/29/2019: Hemoglobin 9.4, ferritin 216  Work-up did not reveal thalassemia. IV iron treatment: March 2020  Lab review  04/14/2020: Hemoglobin 9.9, platelets 131, MCV 89.7, B12 527, iron saturation 26%, ferritin 18, creatinine 1.04, calcium 11.3, folic acid 38.3  Because of anemia with hypercalcemia and renal insufficiency, I would like to get serum protein electrophoresis with immunofixation.  She has normocytic anemia and iron deficiency appears to be very mild. For elevated calcium levels: I would like to get PTH levels also drawn today.  I will call her with the results of this test in the next couple of days.  I suspect that given the ferritin coming down gradually over time that she might need IV iron in the future. We will see her back in 4 months with labs done ahead of time.   No orders of the defined types were placed in this encounter.  The patient has a good understanding of the overall plan. she agrees with it. she will call with any problems that may develop before the next visit here.  Total time spent: 20 mins including face to face time and time spent for planning, charting and coordination of care  Gudena, Vinay, MD 04/18/2020  I, Molly Dorshimer, am acting as scribe for Dr. Vinay Gudena.  I have reviewed the above documentation for accuracy and completeness, and I agree with the above.       

## 2020-04-18 ENCOUNTER — Inpatient Hospital Stay: Payer: Medicare PPO | Admitting: Hematology and Oncology

## 2020-04-18 ENCOUNTER — Emergency Department (HOSPITAL_COMMUNITY): Payer: Medicare PPO

## 2020-04-18 ENCOUNTER — Emergency Department (HOSPITAL_COMMUNITY)
Admission: EM | Admit: 2020-04-18 | Discharge: 2020-04-19 | Disposition: A | Discharge status: Home / Self Care | Payer: Medicare PPO | Attending: Emergency Medicine | Admitting: Emergency Medicine

## 2020-04-18 ENCOUNTER — Other Ambulatory Visit: Payer: Self-pay

## 2020-04-18 ENCOUNTER — Inpatient Hospital Stay: Payer: Medicare PPO

## 2020-04-18 ENCOUNTER — Ambulatory Visit: Payer: Medicare PPO

## 2020-04-18 ENCOUNTER — Other Ambulatory Visit: Payer: Self-pay | Admitting: Family Medicine

## 2020-04-18 ENCOUNTER — Other Ambulatory Visit: Payer: Medicare Other

## 2020-04-18 ENCOUNTER — Encounter (HOSPITAL_COMMUNITY): Payer: Self-pay

## 2020-04-18 VITALS — BP 146/51 | HR 79 | Temp 99.1°F | Resp 18 | Ht 63.0 in | Wt 158.9 lb

## 2020-04-18 DIAGNOSIS — D508 Other iron deficiency anemias: Secondary | ICD-10-CM | POA: Diagnosis not present

## 2020-04-18 DIAGNOSIS — D649 Anemia, unspecified: Secondary | ICD-10-CM | POA: Diagnosis not present

## 2020-04-18 DIAGNOSIS — M79602 Pain in left arm: Secondary | ICD-10-CM | POA: Diagnosis not present

## 2020-04-18 DIAGNOSIS — Z5321 Procedure and treatment not carried out due to patient leaving prior to being seen by health care provider: Secondary | ICD-10-CM | POA: Insufficient documentation

## 2020-04-18 DIAGNOSIS — R079 Chest pain, unspecified: Secondary | ICD-10-CM | POA: Diagnosis not present

## 2020-04-18 LAB — CBC
HCT: 31.2 % — ABNORMAL LOW (ref 36.0–46.0)
Hemoglobin: 9.8 g/dL — ABNORMAL LOW (ref 12.0–15.0)
MCH: 28.5 pg (ref 26.0–34.0)
MCHC: 31.4 g/dL (ref 30.0–36.0)
MCV: 90.7 fL (ref 80.0–100.0)
Platelets: 117 10*3/uL — ABNORMAL LOW (ref 150–400)
RBC: 3.44 MIL/uL — ABNORMAL LOW (ref 3.87–5.11)
RDW: 14.7 % (ref 11.5–15.5)
WBC: 5.3 10*3/uL (ref 4.0–10.5)
nRBC: 0 % (ref 0.0–0.2)

## 2020-04-18 LAB — BASIC METABOLIC PANEL
Anion gap: 9 (ref 5–15)
BUN: 18 mg/dL (ref 8–23)
CO2: 29 mmol/L (ref 22–32)
Calcium: 9.3 mg/dL (ref 8.9–10.3)
Chloride: 100 mmol/L (ref 98–111)
Creatinine, Ser: 1.1 mg/dL — ABNORMAL HIGH (ref 0.44–1.00)
GFR calc Af Amer: 56 mL/min — ABNORMAL LOW (ref >60)
GFR calc non Af Amer: 48 mL/min — ABNORMAL LOW (ref >60)
Glucose, Bld: 305 mg/dL — ABNORMAL HIGH (ref 70–99)
Potassium: 4 mmol/L (ref 3.5–5.1)
Sodium: 138 mmol/L (ref 135–145)

## 2020-04-18 LAB — RETICULOCYTES
Immature Retic Fract: 12.5 % (ref 2.3–15.9)
RBC.: 3.41 MIL/uL — ABNORMAL LOW (ref 3.87–5.11)
Retic Count, Absolute: 42.6 10*3/uL (ref 19.0–186.0)
Retic Ct Pct: 1.3 % (ref 0.4–3.1)

## 2020-04-18 LAB — TROPONIN I (HIGH SENSITIVITY): Troponin I (High Sensitivity): 4 ng/L (ref <18)

## 2020-04-18 MED ORDER — SODIUM CHLORIDE 0.9% FLUSH: 3.0000 mL | Freq: Once | INTRAVENOUS | Status: DC

## 2020-04-18 NOTE — Chronic Care Management (AMB) (Signed)
  Care Management   Outreach Note  04/18/2020 Name: [name] MRN: [id number] DOB: 03-16-1941  Referred by: [name], MD Reason for referral : Care Coordination (Care Management RNCM HTN/ DM)   An unsuccessful telephone outreach was attempted today. The patient was referred to the case management team for assistance with care management and care coordination.   Follow Up Plan: A HIPPA compliant phone message was left for the patient providing contact information and requesting a return call.  The care management team will reach out to the patient again over the next 5-7 days.   Lazaro Arms RN, BSN, [facility] Care Management Coordinator [place] Phone: [phone] Fax: [phone]

## 2020-04-18 NOTE — ED Notes (Signed)
[name] [phone], daughter's boyfriend wants to be called for update.

## 2020-04-18 NOTE — ED Triage Notes (Signed)
Onset 6pm left arm, posterior shoulder pain.  Pt took Tylenol x 2 tabs with little relief.  At 7:55pm pt NTG x 1 with some relief.  Denies chest pain, shortness of breath, dizziness, nausea.

## 2020-04-18 NOTE — Assessment & Plan Note (Signed)
January 19, 2019 hemoglobin 9.3 platelets 126,000.  February 09, 2019 hemoglobin 9.6 platelets 132,000Ferritin 23. Folate was normal B12 was normal. The patient reports history of diverticulitis.and  hemorrhoids. Last colonoscopy was 07/02/2013 and showed diverticuli as well as hemorrhoids. 04/29/2019: Hemoglobin 9.4, ferritin 216  Work-up did not reveal thalassemia. IV iron treatment: March 2020  Lab review 04/14/2020: Hemoglobin 9.9, platelets 131, MCV 89.7, B12 527, iron saturation 26%, ferritin 18, creatinine 1.04, calcium 99991111, folic acid 0000000  Because of anemia with hypercalcemia and renal insufficiency, I would like to get serum protein electrophoresis with immunofixation.  She has normocytic anemia and iron deficiency appears to be very mild.

## 2020-04-19 ENCOUNTER — Telehealth: Payer: Self-pay | Admitting: Cardiovascular Disease

## 2020-04-19 ENCOUNTER — Ambulatory Visit: Payer: Medicare PPO | Admitting: Physician Assistant

## 2020-04-19 ENCOUNTER — Telehealth (HOSPITAL_COMMUNITY): Payer: Self-pay | Admitting: *Deleted

## 2020-04-19 ENCOUNTER — Encounter: Payer: Self-pay | Admitting: Physician Assistant

## 2020-04-19 ENCOUNTER — Telehealth: Payer: Self-pay

## 2020-04-19 VITALS — BP 136/68 | HR 80 | Ht 62.5 in | Wt 160.0 lb

## 2020-04-19 DIAGNOSIS — D638 Anemia in other chronic diseases classified elsewhere: Secondary | ICD-10-CM

## 2020-04-19 DIAGNOSIS — I25119 Atherosclerotic heart disease of native coronary artery with unspecified angina pectoris: Secondary | ICD-10-CM

## 2020-04-19 DIAGNOSIS — N1831 Chronic kidney disease, stage 3a: Secondary | ICD-10-CM

## 2020-04-19 DIAGNOSIS — I10 Essential (primary) hypertension: Secondary | ICD-10-CM | POA: Diagnosis not present

## 2020-04-19 LAB — ERYTHROPOIETIN: Erythropoietin: 68.1 m[IU]/mL — ABNORMAL HIGH (ref 2.6–18.5)

## 2020-04-19 LAB — TROPONIN I (HIGH SENSITIVITY): Troponin I (High Sensitivity): 4 ng/L (ref <18)

## 2020-04-19 LAB — KAPPA/LAMBDA LIGHT CHAINS
Kappa free light chain: 32.4 mg/L — ABNORMAL HIGH (ref 3.3–19.4)
Kappa, lambda light chain ratio: 1.43 (ref 0.26–1.65)
Lambda free light chains: 22.6 mg/L (ref 5.7–26.3)

## 2020-04-19 LAB — PTH, INTACT AND CALCIUM
Calcium, Total (PTH): 9.8 mg/dL (ref 8.7–10.3)
PTH: 25 pg/mL (ref 15–65)

## 2020-04-19 MED ORDER — ISOSORBIDE MONONITRATE ER 60 MG PO TB24
90.0000 mg | ORAL_TABLET | Freq: Every day | ORAL | 11 refills | Status: DC
Start: 2020-04-19 — End: 2021-06-04

## 2020-04-19 NOTE — Telephone Encounter (Signed)
Pressure in my chest yesterday afternoon.  Started about 5:30 took some tylenol with no relief.  Took nitro about 8pm and this "eased pain off a whole lot".  She went to ED overnight d/t shoulder pain/chest discomfort but left d/t length of waitime in ED. She denies current CP, shoulder pain. No recent stressors, heavy lifting. Denies CP, shoulder pain, SOB, dizziness and edema. Pt scheduled to see Richardson Dopp,  PA this afternoon to further discuss issue/concern. Pt advised if CP/shoulder pain reoccurs she should go directly to ED. Patient verbalized understanding and agreeable to plan.

## 2020-04-19 NOTE — Telephone Encounter (Signed)
Patient calls nurse line regarding left arm pain. Patient reports going to the ED yesterday evening for evaluation. However, patient states that she left after triage due to long wait times. Patient denies current chest pain, SHOB, dizziness.. At this time patient only reports mild, dull pain in shoulder and left arm.   Patient is able to speak in full sentences and does not appear to be in any distress.   Spoke with Dr. Andria Frames regarding patient. Dr. Andria Frames to call patient. Please return call to patient at [phone] Forwarding to Dr. Andria Frames and PCP  Talbot Grumbling, RN

## 2020-04-19 NOTE — Telephone Encounter (Signed)
Patient given detailed instructions per Myocardial Perfusion Study Information Sheet for the test on 04/26/2020 at 1000. Patient notified to arrive 15 minutes early and that it is imperative to arrive on time for appointment to keep from having the test rescheduled.  If you need to cancel or reschedule your appointment, please call the office within 24 hours of your appointment. . Patient verbalized understanding.[name], [place] Patient does not use the [employer]

## 2020-04-19 NOTE — Telephone Encounter (Signed)
   Pt is having left shoulder pain, she went to ED after 2 hours waiting she lefts when she was told wait time is going to be 6-7 hours more. She called her pcp and was advised to see cards in the next 2 - 3 days. No available appt with Dr. Johnsie Cancel or care team, reached out to [facility] and advised to send pt to nurse triage for eval

## 2020-04-19 NOTE — ED Notes (Signed)
Pt called for vitals, no response. 

## 2020-04-19 NOTE — Patient Instructions (Addendum)
Medication Instructions:    START TAKING IMDUR 90 MG ONCE A  DAY   *If you need a refill on your cardiac medications before your next appointment, please call your pharmacy*   Lab Work: TROPONIN I  FOR SENSITIVITY STAT   If you have labs (blood work) drawn today and your tests are completely normal, you will receive your results only by: [name] MyChart Message (if you have MyChart) OR . A paper copy in the mail If you have any lab test that is abnormal or we need to change your treatment, we will call you to review the results.   Testing/Procedures: Your physician has requested that you have a lexiscan myoview. For further information please visit [email or web address]. Please follow instruction sheet, as given.   Follow-Up: At [facility], you and your health needs are our priority.  As part of our continuing mission to provide you with exceptional heart care, we have created designated Provider Care Teams.  These Care Teams include your primary Cardiologist (physician) and Advanced Practice Providers (APPs -  Physician Assistants and Nurse Practitioners) who all work together to provide you with the care you need, when you need it.  We recommend signing up for the patient portal called "MyChart".  Sign up information is provided on this After Visit Summary.  MyChart is used to connect with patients for Virtual Visits (Telemedicine).  Patients are able to view lab/test results, encounter notes, upcoming appointments, etc.  Non-urgent messages can be sent to your provider as well.   To learn more about what you can do with MyChart, go to [email or web address].    Your next appointment:   1 week(s)  The format for your next appointment:   In Person  Provider:   You may see Jenkins Rouge, MD or one of the following Advanced Practice Providers on your designated Care Team:    Truitt Merle, NP  Cecilie Kicks, NP  Kathyrn Drown, NP    Other Instructions  PLEASE GO TO EMERGENCY ROOM  IF YOU HAVE ANY MORE CHEST PAIN

## 2020-04-19 NOTE — Telephone Encounter (Signed)
Called.  Left shoulder and back pain last night.  Not relieved by tylenol or heating pad.  Also had nausea.  Then took NTG which helped both pain and nausea.   No unusual activities.  No SOB.  Did have pain down left arm briefly to her shoulder.  Reminds her of the pain she had with stent. Trop, EKG and CXR done in ER reassuring.   Recent visit to cards OK but not having any pain at the time  Minimal pain this morning.  Concern: Intermediate probability for cardiac origin to pain.  Does not sound like ACS since no pain right now.    Recommend: 1. Go to ER immediately if pain worsens. 2. Call cardiology office for prompt appointment.  They may consider this episode sufficient to warrant stress or other testing.

## 2020-04-19 NOTE — Progress Notes (Signed)
Cardiology Office Note:    Date:  04/19/2020   ID:  [name], DOB 1941/10/05, MRN [id number]  PCP:  [name], MD  Cardiologist:  Jenkins Rouge, MD   Electrophysiologist:  None   Referring MD: [name], MD   Chief Complaint:  Chest Pain    Patient Profile:    [name] is a 79 y.o. female with:   Coronary artery disease   S/p NSTEMI >> POBA to PDA in 2001  S/p cutting POBA to Dx in 2010  Cath in 2012: PDA, Dx sites patent, mild non-obs dz elsewhere   Myoview in 12/2013: no ischemia   Hypertension   Diabetes mellitus   Hyperlipidemia   Chronic anemia of chronic disease  Prior CV studies: Carotid US 03/31/2015 Bilateral ICA 1-39  Myoview 01/19/2014 No ischemia, EF 70; inferolateral scar  Myoview 03/07/2013 Inferolateral scar, no ischemia, EF 72  Cardiac catheterization 11/26/2011 LAD proximal 20, mid 30; D1 patent RI proximal 20 LCx small without significant disease RCA patent; PDA patent, 35 prior to bifurcation EF 55-65  Cardiac catheterization 03/14/2009 LAD mid 30-40; BX proximal 80-90 RCA 30; PDA 50 Normal LV function PCI: Cutting Balloon angioplasty to the diagonal  Echocardiogram 11/19/2006 EF 55  History of Present Illness:    Ms. [name] was last seen by Michele Drown, NP via telemedicine earlier this month.  She was doing well at that time.  Last night, she went to the emergency room with shoulder and chest discomfort.  Her initial high-sensitivity troponin was 4.  Chest x-ray was unremarkable.  Hemoglobin and creatinine were stable.  ECG was personally reviewed.  This demonstrated inferior and anterior T wave inversions.  This is similar to her prior tracing from 11/03/2019.  She was added on for further evaluation.  She developed fairly severe left arm and scapular discomfort last night about 5:30 PM.  She took some Tylenol.  She had some nausea and then decided to take nitroglycerin.  The nitroglycerin did improve  her symptoms somewhat but did not resolve them.  She went to the emergency room.  High-sensitivity troponin was drawn 21:02, which would have been about 3-1/2 hours after the onset of her symptoms.  This was negative at 4.  She decided to leave the emergency room without being seen.  She continued to have symptoms last evening and noted some mild left chest tightness/heaviness this morning.  This eventually resolved and she has been chest pain-free since.  She has not had shortness of breath, syncope, orthopnea or leg swelling.  Past Medical History:  Diagnosis Date  . Aortic arch atherosclerosis ([place]) 08/29/2017  . Carpal tunnel syndrome 02/19/2007   Qualifier: Diagnosis of  By: Samara Snide    . Coronary artery disease    a. Remote PTCA of the PDA;  b. 02/2009 PCI/CBA to the D1;  c. 11/2011 Cath: LM nl, LAD 20p, 15m D1 patent, RI 20p, LCX nl, RCA 30d, EF 55-65%.  . CTS (carpal tunnel syndrome)   . GERD (gastroesophageal reflux disease)   . 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 History of thyroid cancer   . Hypothyroid   . IBS (irritable bowel syndrome) 12/27/2014  . Nodule of right lung 08/29/2017   3 mm stable nodule right upper lobe on CTA Chest 08/28/17  . 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 Subscapular pain, left 08/29/2017    Current Medications: Current Meds  Medication Sig  . Accu-Chek Softclix Lancets lancets Use to check capillary glucose TID  . amLODipine (NORVASC) 5 MG tablet Take 1 tablet (5 mg total) by mouth daily.  [name] aspirin 81 MG chewable tablet Chew 81 mg by mouth daily.    [name] atorvastatin (LIPITOR) 20 MG tablet TAKE ONE TABLET BY MOUTH DAILY AT 6PM  . Blood Glucose Monitoring Suppl (ACCU-CHEK GUIDE) w/Device KIT 1 kit by Does not apply route in the morning, at noon, and at bedtime.  . carvedilol (COREG) 25 MG tablet TAKE ONE TABLET BY MOUTH TWICE A DAY WITH MEALS  . glucose blood (ACCU-CHEK GUIDE) test  strip Use to check capillary glucose TID  . Insulin Syringe-Needle U-100 (RELION INSULIN SYRINGE) 31G X 15/64" 1 ML MISC USE  ONCE DAILY AS DIRECTED  . LANTUS 100 UNIT/ML injection INJECT 25 UNITS INTO THE SKIN EVERY MORNING AS DIRECTED. HOLD IF BLOOD GLUCOSE IS LESS THAN 70.  . lisinopril-hydrochlorothiazide (ZESTORETIC) 20-12.5 MG tablet TAKE TWO TABLETS BY MOUTH DAILY  . loratadine (CLARITIN) 10 MG tablet Take 10 mg by mouth daily as needed for allergies.   . metFORMIN (GLUCOPHAGE-XR) 500 MG 24 hr tablet TAKE TWO TABLETS BY MOUTH TWICE A DAY  . Multiple Vitamin (MULTIVITAMIN WITH MINERALS) TABS tablet Take 1 tablet by mouth daily. Reported on 04/02/2016  . nitroGLYCERIN (NITROSTAT) 0.4 MG SL tablet Place 1 tablet (0.4 mg total) under the tongue every 5 (five) minutes as needed for chest pain.  . [DISCONTINUED] isosorbide mononitrate (IMDUR) 60 MG 24 hr tablet TAKE ONE TABLET BY MOUTH DAILY     Allergies:   Codeine, Sulfamethoxazole-trimethoprim, and Tramadol   Social History   Tobacco Use  . Smoking status: Former Smoker    Types: Cigarettes    Quit date: 12/23/1980    Years since quitting: 39.3  . Smokeless tobacco: Former Systems developer    Types: [place] date: 08/23/1994  Substance Use Topics  . Alcohol use: No  . Drug use: No     Family Hx: The patient's family history includes Breast cancer in her daughter; Diabetes in her mother; Diabetes Mellitus II in her brother and brother; Epilepsy in her brother; Heart attack in her brother, brother, and sister; Hypertension in her mother; Other in her father.  ROS   EKGs/Labs/Other Test Reviewed:    EKG:  EKG is   ordered today.  The ekg ordered today demonstrates normal sinus rhythm, heart rate 74, normal axis, T wave inversions 3, aVF, V3-V4, QTC 395.  T wave inversions are chronic and there has been no change since her last tracing in the office dated 11/03/2019.  Recent Labs: 04/14/2020: ALT 16 04/18/2020: BUN 18; Creatinine, Ser 1.10;  Hemoglobin 9.8; Platelets 117; Potassium 4.0; Sodium 138   Recent Lipid Panel Lab Results  Component Value Date/Time   CHOL 120 01/19/2019 12:02 PM   TRIG 131 01/19/2019 12:02 PM   HDL 43 01/19/2019 12:02 PM   CHOLHDL 2.8 01/19/2019 12:02 PM   CHOLHDL 2.2 08/06/2016 11:35 AM   LDLCALC 51 01/19/2019 12:02 PM   LDLDIRECT 76 02/06/2011 11:34 AM    Physical Exam:    VS:  BP 136/68   Pulse 80   Ht 5' 2.5" (1.588 m)   Wt 160 lb (72.6 kg)   LMP 11/24/1992   SpO2 99%   BMI 28.80 kg/m     Wt Readings from Last 3 Encounters:  04/19/20 160 lb (72.6 kg)  04/18/20 158 lb 14.4 oz (72.1  kg)  04/18/20 158 lb 14.4 oz (72.1 kg)     Constitutional:      Appearance: Healthy appearance. Not in distress.  Neck:     Thyroid: No thyromegaly.     Vascular: JVD normal.     Lymphadenopathy: No cervical adenopathy.  Pulmonary:     Breath sounds: No wheezing. No rales.  Cardiovascular:     Normal rate. Regular rhythm. Normal S1. Normal S2.     Murmurs: There is no murmur.  Edema:    Peripheral edema absent.  Abdominal:     Palpations: Abdomen is soft. There is no hepatomegaly.  Skin:    General: Skin is warm and dry.  Neurological:     General: No focal deficit present.     Mental Status: Alert and oriented to person, place and time.  Psychiatric:        Mood and Affect: Affect normal.       ASSESSMENT & PLAN:    1. Coronary artery disease involving native coronary artery of native heart with angina pectoris [facility]) History of prior angioplasty to the PDA in 2001 the setting of a non-ST elevation myocardial infarction.  She subsequently had angioplasty to the diagonal 2010.  Cardiac catheterization 2012 demonstrated patent PTCA sites and no significant disease elsewhere.  Her last stress test was in 2015 and demonstrated no ischemia.  She now presents with left arm, left chest and left scapular chest discomfort.  These symptoms went on for almost 12 hours before finally resolving.  She  had 1 high-sensitivity troponin 4 hours into her symptoms that was negative.  Her ECG has chronic inf and ant TW inversions and it is unchanged today.  She is currently pain free.  As she has 12 hours of symptoms and 1 neg hs-Troponin, I think it is reasonable to repeat her hs-Troponin.  If it remains low without significant delta, we can proceed with stress testing this week.  If her hs-Troponin is elevated with significant delta, we will need to send her back to the hospital for admission and possible cardiac catheterization.  We discussed this plan in detail and she is comfortable with this approach.  I have asked her to go back to the ED if she has any recurrent symptoms.    -Obtain hs-Troponin now and run stat  -Arrange Lexiscan Myoview this week  -If hs-Troponin elevated w/ significant delta, send to ED for admission  -Increase Isosorbide MN to 90 mg once daily   -Continue ASA, amlodipine, Carvedilol, Atorvastatin at current dose   2. Essential hypertension Blood pressure somewhat elevated.  Adjust isosorbide as noted above.  3. Stage 3a chronic kidney disease Recent creatinine stable.  4. Anemia of chronic disease She has been followed by hematology and has undergone iron infusions in the past.  Most recent hemoglobin stable at 9.8.    Dispo:  Return in about 2 weeks (around 05/03/2020) for Close Follow Up w/ Dr. Johnsie Cancel, or PA/NP, in person.   Medication Adjustments/Labs and Tests Ordered: Current medicines are reviewed at length with the patient today.  Concerns regarding medicines are outlined above.   Tests Ordered: Orders Placed This Encounter  Procedures  . MYOCARDIAL PERFUSION IMAGING  . EKG 12-Lead   Medication Changes: Meds ordered this encounter  Medications  . isosorbide mononitrate (IMDUR) 60 MG 24 hr tablet    Sig: Take 1.5 tablets (90 mg total) by mouth daily.    Dispense:  45 tablet    Refill:  11  Dose Increase    Order Specific Question:   Supervising  Provider    Answer:   Lelon Perla [[id number]]    Signed, Richardson Dopp, PA-C  04/19/2020 2:42 PM    [facility] [place], [place], [place]  [address] Phone: [phone]; Fax: [phone]

## 2020-04-21 LAB — MULTIPLE MYELOMA PANEL, SERUM
Albumin SerPl Elph-Mcnc: 3.7 g/dL (ref 2.9–4.4)
Albumin/Glob SerPl: 1.3 (ref 0.7–1.7)
Alpha 1: 0.2 g/dL (ref 0.0–0.4)
Alpha2 Glob SerPl Elph-Mcnc: 0.9 g/dL (ref 0.4–1.0)
B-Globulin SerPl Elph-Mcnc: 0.9 g/dL (ref 0.7–1.3)
Gamma Glob SerPl Elph-Mcnc: 0.9 g/dL (ref 0.4–1.8)
Globulin, Total: 2.9 g/dL (ref 2.2–3.9)
IgA: 149 mg/dL (ref 64–422)
IgG (Immunoglobin G), Serum: 1082 mg/dL (ref 586–1602)
IgM (Immunoglobulin M), Srm: 54 mg/dL (ref 26–217)
Total Protein ELP: 6.6 g/dL (ref 6.0–8.5)

## 2020-04-25 ENCOUNTER — Ambulatory Visit: Payer: Medicare PPO

## 2020-04-25 ENCOUNTER — Other Ambulatory Visit: Payer: Self-pay

## 2020-04-25 ENCOUNTER — Encounter (HOSPITAL_COMMUNITY): Payer: Medicare PPO

## 2020-04-25 NOTE — Patient Instructions (Signed)
Visit Information  Goals Addressed            This Visit's Progress   . "Sometimes my pressure goes up and sometimes it is good." (pt-stated)       Current Barriers:  [name] Knowledge Deficits related to basic understanding of hypertension pathophysiology and self care management  Case Manager Clinical Goal(s):  [name] Over the next 60 days, patient will verbalize basic understanding of hypertension disease process and self health management plan as evidenced by the patient keeping her blood pressure at or below 140/90.  Interventions:  . Evaluation of current treatment plan related to hypertension self management and patient's adherence to plan as established by provider. . Reviewed medications with patient and discussed importance of compliance . Discussed plans with patient for ongoing care management follow up and provided patient with direct contact information for care management team . The patient has been checking her blood pressures and recording values. Bid  as prescribed by physician  bp this am 149/77 hr 70 after medication 133/77 hr 77 .  Patient is to continue to monitor salt in her diet . Advised if she uses vegetables in the can pour out the liquid that the vegetables are stored in and rinse them off and then cook them.  . Congratulated her on her continued efforts.  . 01/13/20 . Patient is checking her BP before medication and after medications.  Before 148/69 hr 81 147/56 hr 76 After 137/72 hr 76 120/74 hr 80 . She is monitoring the sodium in her diet .  Her and her daughter are working together with meals and drinking water . She is taking her medications as prescribed . 04/25/20 . Spoke with patient today and she states that she has been doing well . Her blood pressures have been ranging= Today 133/65 HR 70 Monday 130/56 HR 81 Sunday 148/70 HR 71 that Evening  119/66 HR 68 Saturday 162/66 HR 70 . Patient states that she is monitoring her diet . She is drinking 3 bottles of  water a day and has limited her sodas  Patient Self Care Activities:  . Self administers medications as prescribed . Checks BP and records as discussed . Follows a low sodium diet/DASH diet  Please see past updates related to this goal by clicking on the "Past Updates" button in the selected goal          [name] was given information about Care Management services today including:  1. Care Management services include personalized support from designated clinical staff supervised by her physician, including individualized plan of care and coordination with other care providers 2. 24/7 contact phone numbers for assistance for urgent and routine care needs. 3. The patient may stop CCM services at any time (effective at the end of the month) by phone call to the office staff.  Patient agreed to services and verbal consent obtained.   The patient verbalized understanding of instructions provided today and declined a print copy of patient instruction materials.   The care management team will reach out to the patient again over the next 14 days.   Lazaro Arms RN, BSN, [facility] Care Management Coordinator [place] Phone: [phone] Fax: [phone]

## 2020-04-25 NOTE — Chronic Care Management (AMB) (Signed)
Care Management   Follow Up Note   04/25/2020 Name: [name] MRN: [id number] DOB: 1941/01/30  Referred by: Michele Feil, MD Reason for referral : Care Coordination (Care Management RNCM F/U 2nd attempt)   [name] is a 79 y.o. year old female who is a primary care patient of Michele Feil, MD. The care management team was consulted for assistance with care management and care coordination needs.    Review of patient status, including review of consultants reports, relevant laboratory and other test results, and collaboration with appropriate care team members and the patient's provider was performed as part of comprehensive patient evaluation and provision of chronic care management services.    SDOH (Social Determinants of Health) assessments performed: No See Care Plan activities for detailed interventions related to [facility])     Advanced Directives: See Care Plan and Vynca application for related entries.   Goals Addressed            This Visit's Progress   . "Sometimes my pressure goes up and sometimes it is good." (pt-stated)       Current Barriers:  [name] Knowledge Deficits related to basic understanding of hypertension pathophysiology and self care management  Case Manager Clinical Goal(s):  [name] Over the next 60 days, patient will verbalize basic understanding of hypertension disease process and self health management plan as evidenced by the patient keeping her blood pressure at or below 140/90.  Interventions:  . Evaluation of current treatment plan related to hypertension self management and patient's adherence to plan as established by provider. . Reviewed medications with patient and discussed importance of compliance . Discussed plans with patient for ongoing care management follow up and provided patient with direct contact information for care management team . The patient has been checking her blood pressures and recording values. Bid  as  prescribed by physician  bp this am 149/77 hr 70 after medication 133/77 hr 77 .  Patient is to continue to monitor salt in her diet . Advised if she uses vegetables in the can pour out the liquid that the vegetables are stored in and rinse them off and then cook them.  . Congratulated her on her continued efforts.  . 01/13/20 . Patient is checking her BP before medication and after medications.  Before 148/69 hr 81 147/56 hr 76 After 137/72 hr 76 120/74 hr 80 . She is monitoring the sodium in her diet .  Her and her daughter are working together with meals and drinking water . She is taking her medications as prescribed . 04/25/20 . Spoke with patient today and she states that she has been doing well . Her blood pressures have been ranging= Today 133/65 HR 70 Monday 130/56 HR 81 Sunday 148/70 HR 71 that Evening  119/66 HR 68 Saturday 162/66 HR 70 . Patient states that she is monitoring her diet . She is drinking 3 bottles of water a day and has limited her sodas  Patient Self Care Activities:  . Self administers medications as prescribed . Checks BP and records as discussed . Follows a low sodium diet/DASH diet  Please see past updates related to this goal by clicking on the "Past Updates" button in the selected goal           The care management team will reach out to the patient again over the next 14 days.  The patient has been provided with contact information for the care management team and has been  advised to call with any health related questions or concerns.   Lazaro Arms RN, BSN, [facility] Care Management Coordinator [place] Phone: [phone] Fax: [phone]

## 2020-04-26 ENCOUNTER — Ambulatory Visit (HOSPITAL_COMMUNITY): Payer: Medicare PPO | Attending: Cardiovascular Disease

## 2020-04-26 ENCOUNTER — Other Ambulatory Visit: Payer: Self-pay

## 2020-04-26 DIAGNOSIS — I25119 Atherosclerotic heart disease of native coronary artery with unspecified angina pectoris: Secondary | ICD-10-CM | POA: Diagnosis not present

## 2020-04-26 LAB — MYOCARDIAL PERFUSION IMAGING
LV dias vol: 56 mL (ref 46–106)
LV sys vol: 18 mL
Peak HR: 97 {beats}/min
Rest HR: 72 {beats}/min
SDS: 3
SRS: 0
SSS: 3
TID: 0.87

## 2020-04-26 MED ORDER — TECHNETIUM TC 99M TETROFOSMIN IV KIT
31.5000 | PACK | Freq: Once | INTRAVENOUS | Status: AC | PRN
  Administered 2020-04-26: 31.5 via INTRAVENOUS
  Filled 2020-04-26: qty 32

## 2020-04-26 MED ORDER — REGADENOSON 0.4 MG/5ML IV SOLN
0.4000 mg | Freq: Once | INTRAVENOUS | Status: AC
  Administered 2020-04-26: 0.4 mg via INTRAVENOUS

## 2020-04-26 MED ORDER — TECHNETIUM TC 99M TETROFOSMIN IV KIT
11.0000 | PACK | Freq: Once | INTRAVENOUS | Status: AC | PRN
  Administered 2020-04-26: 11 via INTRAVENOUS
  Filled 2020-04-26: qty 11

## 2020-04-27 ENCOUNTER — Encounter: Payer: Self-pay | Admitting: Physician Assistant

## 2020-05-03 ENCOUNTER — Encounter: Payer: Self-pay | Admitting: Physician Assistant

## 2020-05-03 ENCOUNTER — Other Ambulatory Visit: Payer: Self-pay

## 2020-05-03 ENCOUNTER — Ambulatory Visit: Payer: Medicare PPO | Admitting: Physician Assistant

## 2020-05-03 VITALS — BP 176/50 | HR 70 | Ht 62.0 in | Wt 158.4 lb

## 2020-05-03 DIAGNOSIS — I25119 Atherosclerotic heart disease of native coronary artery with unspecified angina pectoris: Secondary | ICD-10-CM | POA: Diagnosis not present

## 2020-05-03 NOTE — Patient Instructions (Signed)
Medication Instructions:   Your physician recommends that you continue on your current medications as directed. Please refer to the Current Medication list given to you today.  *If you need a refill on your cardiac medications before your next appointment, please call your pharmacy*  Lab Work:  None ordered today  Testing/Procedures:  None ordered today  Follow-Up: At [facility], you and your health needs are our priority.  As part of our continuing mission to provide you with exceptional heart care, we have created designated Provider Care Teams.  These Care Teams include your primary Cardiologist (physician) and Advanced Practice Providers (APPs -  Physician Assistants and Nurse Practitioners) who all work together to provide you with the care you need, when you need it.  We recommend signing up for the patient portal called "MyChart".  Sign up information is provided on this After Visit Summary.  MyChart is used to connect with patients for Virtual Visits (Telemedicine).  Patients are able to view lab/test results, encounter notes, upcoming appointments, etc.  Non-urgent messages can be sent to your provider as well.   To learn more about what you can do with MyChart, go to [email or web address].    Your next appointment:   6 month(s)  The format for your next appointment:   In Person  Provider:   Jenkins Rouge, MD

## 2020-05-03 NOTE — Progress Notes (Signed)
Cardiology Office Note:    Date:  05/03/2020   ID:  [name], DOB 22-Aug-1941, MRN [id number]  PCP:  [name], MD  Cardiologist:  Jenkins Rouge, MD  Electrophysiologist:  None   Referring MD: [name], MD   Chief Complaint:  Follow-up (chest pain; recent stress test)    Patient Profile:    [name] is a 79 y.o. female with:   Coronary artery disease  ? S/p NSTEMI >> POBA to PDA in 2001 ? S/p cutting POBA to Dx in 2010 ? Cath in 2012: PDA, Dx sites patent, mild non-obs dz elsewhere  ? Myoview in 12/2013: no ischemia   Hypertension   Diabetes mellitus   Hyperlipidemia   Chronic anemia of chronic disease  Prior CV studies: Myoview 04/26/2020 EF 69, no ischemia or infarction, low risk  Carotid US 03/31/2015 Bilateral ICA 1-39  Myoview 01/19/2014 No ischemia, EF 70; inferolateral scar  Myoview 03/07/2013 Inferolateral scar, no ischemia, EF 72  Cardiac catheterization 11/26/2011 LAD proximal 20, mid 30; D1 patent RI proximal 20 LCx small without significant disease RCA patent; PDA patent, 35 prior to bifurcation EF 55-65  Cardiac catheterization 03/14/2009 LAD mid 30-40; BX proximal 80-90 RCA 30; PDA 50 Normal LV function PCI: Cutting Balloon angioplasty to the diagonal  Echocardiogram 11/19/2006 EF 55   History of Present Illness:    Ms. Heatwole was last seen 04/19/2020 after trip to the emergency room with chest pain.  She had a negative high-sensitivity troponin.  She had had several hours of chest pain.  A repeat high-sensitivity troponin was negative.  I set up for a stress test which demonstrated no infarction or ischemia.  I adjusted her isosorbide.  She returns for follow-up.  She is here alone.  She had 1 episode of bilateral shoulder pain.  This seems to be related to positional changes.  She has not had any chest discomfort, significant shortness of breath, orthopnea, syncope or significant leg swelling.     Past  Medical History:  Diagnosis Date  . Aortic arch atherosclerosis ([place]) 08/29/2017  . Carpal tunnel syndrome 02/19/2007   Qualifier: Diagnosis of  By: Samara Snide    . Coronary artery disease    a. Remote PTCA of the PDA;  b. 02/2009 PCI/CBA to the D1;  c. 11/2011 Cath: LM nl, LAD 20p, 78m D1 patent, RI 20p, LCX nl, RCA 30d, EF 55-65%.// Myoview 04/2020: EF 69 no ischemia or infarction, low risk   . CTS (carpal tunnel syndrome)   . GERD (gastroesophageal reflux disease)   . 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 History of thyroid cancer   . Hypothyroid   . IBS (irritable bowel syndrome) 12/27/2014  . Nodule of right lung 08/29/2017   3 mm stable nodule right upper lobe on CTA Chest 08/28/17  . 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 Subscapular pain, left 08/29/2017    Current Medications: Current Meds  Medication Sig  . Accu-Chek Softclix Lancets lancets Use to check capillary glucose TID  . amLODipine (NORVASC) 5 MG tablet Take 1 tablet (5 mg total) by mouth daily.  .Marland Kitchenaspirin 81 MG chewable tablet Chew 81 mg by mouth daily.    .Marland Kitchenatorvastatin (LIPITOR) 20 MG tablet TAKE ONE TABLET BY MOUTH DAILY AT 6PM  . Blood Glucose Monitoring Suppl (ACCU-CHEK GUIDE) w/Device KIT 1 kit by Does not apply route in the morning, at noon,  and at bedtime.  . carvedilol (COREG) 25 MG tablet TAKE ONE TABLET BY MOUTH TWICE A DAY WITH MEALS  . glucose blood (ACCU-CHEK GUIDE) test strip Use to check capillary glucose TID  . Insulin Syringe-Needle U-100 (RELION INSULIN SYRINGE) 31G X 15/64" 1 ML MISC USE  ONCE DAILY AS DIRECTED  . isosorbide mononitrate (IMDUR) 60 MG 24 hr tablet Take 1.5 tablets (90 mg total) by mouth daily.  [name] LANTUS 100 UNIT/ML injection INJECT 25 UNITS INTO THE SKIN EVERY MORNING AS DIRECTED. HOLD IF BLOOD GLUCOSE IS LESS THAN 70.  . lisinopril-hydrochlorothiazide (ZESTORETIC) 20-12.5 MG tablet TAKE TWO TABLETS BY MOUTH DAILY  .  loratadine (CLARITIN) 10 MG tablet Take 10 mg by mouth daily as needed for allergies.   . metFORMIN (GLUCOPHAGE-XR) 500 MG 24 hr tablet TAKE TWO TABLETS BY MOUTH TWICE A DAY  . Multiple Vitamin (MULTIVITAMIN WITH MINERALS) TABS tablet Take 1 tablet by mouth daily. Reported on 04/02/2016  . nitroGLYCERIN (NITROSTAT) 0.4 MG SL tablet Place 1 tablet (0.4 mg total) under the tongue every 5 (five) minutes as needed for chest pain.     Allergies:   Codeine, Sulfamethoxazole-trimethoprim, and Tramadol   Social History   Tobacco Use  . Smoking status: Former Smoker    Types: Cigarettes    Quit date: 12/23/1980    Years since quitting: 39.3  . Smokeless tobacco: Former Systems developer    Types: [place] date: 08/23/1994  Substance Use Topics  . Alcohol use: No  . Drug use: No     Family Hx: The patient's family history includes Breast cancer in her daughter; Diabetes in her mother; Diabetes Mellitus II in her brother and brother; Epilepsy in her brother; Heart attack in her brother, brother, and sister; Hypertension in her mother; Other in her father.  ROS   EKGs/Labs/Other Test Reviewed:    EKG:  EKG is not ordered today.  The ekg ordered today demonstrates n/a  Recent Labs: 04/14/2020: ALT 16 04/18/2020: BUN 18; Creatinine, Ser 1.10; Hemoglobin 9.8; Platelets 117; Potassium 4.0; Sodium 138   Recent Lipid Panel Lab Results  Component Value Date/Time   CHOL 120 01/19/2019 12:02 PM   TRIG 131 01/19/2019 12:02 PM   HDL 43 01/19/2019 12:02 PM   CHOLHDL 2.8 01/19/2019 12:02 PM   CHOLHDL 2.2 08/06/2016 11:35 AM   LDLCALC 51 01/19/2019 12:02 PM   LDLDIRECT 76 02/06/2011 11:34 AM    Physical Exam:    VS:  BP (!) 176/50   Pulse 70   Ht 5' 2" (1.575 m)   Wt 158 lb 6.4 oz (71.8 kg)   LMP 11/24/1992   SpO2 91%   BMI 28.97 kg/m     Wt Readings from Last 3 Encounters:  05/03/20 158 lb 6.4 oz (71.8 kg)  04/26/20 160 lb (72.6 kg)  04/19/20 160 lb (72.6 kg)     Constitutional:       Appearance: Healthy appearance. Not in distress.  Neck:     Thyroid: No thyromegaly.     Vascular: JVD normal.  Pulmonary:     Effort: Pulmonary effort is normal.     Breath sounds: No wheezing. No rales.  Cardiovascular:     Normal rate. Regular rhythm. Normal S1. Normal S2.     Murmurs: There is no murmur.  Edema:    Peripheral edema absent.  Abdominal:     Palpations: Abdomen is soft. There is no hepatomegaly.  Skin:    General: Skin is warm  and dry.  Neurological:     Mental Status: Alert and oriented to person, place and time.     Cranial Nerves: Cranial nerves are intact.      ASSESSMENT & PLAN:    1. Coronary artery disease involving native coronary artery of native heart with angina pectoris [facility]) History of prior angioplasty to the PDA in 2001 the setting of a non-ST elevation myocardial infarction.  She subsequently had angioplasty to the diagonal 2010.  Cardiac catheterization 2012 demonstrated patent PTCA sites and no significant disease elsewhere.  I saw her recently after a trip to the ED with chest pain.  Her hs-Troponin levels were normal.  A Lexiscan Myoview was low risk and neg for ischemia.  She has not had any significant chest pain since that time.  Therefore, continue current medical Rx.  Continue current dose of amlodipine, aspirin, atorvastatin, carvedilol, isosorbide mononitrate.  Her blood pressure is somewhat elevated today.  However, she notes optimal blood pressures at home.  Recent LDL was optimal.  Follow-up with Dr. Johnsie Cancel in 6 months.  Dispo:  Return in about 6 months (around 11/03/2020) for Routine Follow Up w/ Dr. Johnsie Cancel, in person.   Medication Adjustments/Labs and Tests Ordered: Current medicines are reviewed at length with the patient today.  Concerns regarding medicines are outlined above.  Tests Ordered: No orders of the defined types were placed in this encounter.  Medication Changes: No orders of the defined types were placed in this  encounter.   Signed, Richardson Dopp, PA-C  05/03/2020 2:16 PM    [facility] [address], [place], [place]  [address] Phone: [phone]; Fax: [phone]

## 2020-05-04 ENCOUNTER — Telehealth: Payer: Self-pay | Admitting: Hematology and Oncology

## 2020-05-04 NOTE — Telephone Encounter (Signed)
Scheduled per 04/27 los, patient has been called and voicemail was left. ?

## 2020-05-09 ENCOUNTER — Ambulatory Visit: Payer: Medicare PPO

## 2020-05-09 ENCOUNTER — Other Ambulatory Visit: Payer: Self-pay

## 2020-05-09 NOTE — Chronic Care Management (AMB) (Signed)
  Care Management   Outreach Note  05/09/2020 Name: [name] MRN: [id number] DOB: [birth year]  Referred by: [name], MD Reason for referral : Care Coordination (Care Management RNCM HTN/DM II)   A second unsuccessful telephone outreach was attempted today. The patient was referred to the case management team for assistance with care management and care coordination.   Follow Up Plan: The care management team will reach out to the patient again over the next 7-14 days.  The patient has been provided with contact information for the care management team and has been advised to call with any health related questions or concerns.   Lazaro Arms RN, BSN, [facility] Care Management Coordinator [place] Phone: [phone] Fax: [phone]

## 2020-05-19 ENCOUNTER — Other Ambulatory Visit: Payer: Self-pay

## 2020-05-19 ENCOUNTER — Ambulatory Visit: Payer: Medicare PPO

## 2020-05-19 NOTE — Chronic Care Management (AMB) (Signed)
  Care Management   Outreach Note  05/19/2020 Name: [name] MRN: [id number] DOB: 14-Dec-1941  Referred by: [name], MD Reason for referral : Care Coordination (Care Mangement RNCM 2 week f/u)   An unsuccessful telephone outreach was attempted today. The patient was referred to the case management team for assistance with care management and care coordination.   Follow Up Plan: A HIPPA compliant phone message was left for the patient providing contact information and requesting a return call.  The care management team will reach out to the patient again over the next 7-14 days.   Lazaro Arms RN, BSN, [facility] Care Management Coordinator [place] Phone: [phone] Fax: [phone]

## 2020-05-24 DIAGNOSIS — H0012 Chalazion right lower eyelid: Secondary | ICD-10-CM | POA: Diagnosis not present

## 2020-06-02 ENCOUNTER — Other Ambulatory Visit: Payer: Self-pay | Admitting: Family Medicine

## 2020-06-02 ENCOUNTER — Ambulatory Visit: Payer: Medicare PPO

## 2020-06-02 ENCOUNTER — Other Ambulatory Visit: Payer: Self-pay

## 2020-06-02 NOTE — Chronic Care Management (AMB) (Signed)
  Care Management   Outreach Note  06/02/2020 Name: [name] MRN: [id number] DOB: 1941/12/05  Referred by: [name], MD Reason for referral : Care Coordination (Care Management RNCM  HTN/DM)   A second unsuccessful telephone outreach was attempted today. The patient was referred to the case management team for assistance with care management and care coordination.   Follow Up Plan: A HIPPA compliant phone message was left for the patient providing contact information and requesting a return call.  The care management team will reach out to the patient again over the next 7-14 days.   Lazaro Arms RN, BSN, [facility] Care Management Coordinator [place] Phone: [phone] Fax: [phone]

## 2020-06-05 DIAGNOSIS — H0012 Chalazion right lower eyelid: Secondary | ICD-10-CM | POA: Diagnosis not present

## 2020-06-07 ENCOUNTER — Ambulatory Visit: Payer: Self-pay

## 2020-06-09 ENCOUNTER — Telehealth: Payer: Medicare PPO

## 2020-06-09 NOTE — Chronic Care Management (AMB) (Signed)
Care Management   Follow Up Note   06/09/2020 Name: [name] MRN: [id number] DOB: 12/08/1941  Referred by: [name], MD Reason for referral : Care Coordination (Care Management RNCM HTN/DM)   [name] is a 79 y.o. year old female who is a primary care patient of [name], MD. The care management team was consulted for assistance with care management and care coordination needs.    Review of patient status, including review of consultants reports, relevant laboratory and other test results, and collaboration with appropriate care team members and the patient's provider was performed as part of comprehensive patient evaluation and provision of chronic care management services.    SDOH (Social Determinants of Health) assessments performed: No See Care Plan activities for detailed interventions related to [facility])     Advanced Directives: See Care Plan and Vynca application for related entries.   Goals Addressed              This Visit's Progress   .  COMPLETED: " I need to know what to eat in the mornings" (pt-stated)        .Current Barriers:  [name] Knowledge Deficits related to self management of food intake.   Nurse Case Manager Clinical Goal(s):  [name] Over the next 60 days, patient will verbalize understanding of plan for adding protein into her diet . Over the next 30 days, patient will work with RN Case Manager to address needs related to Food intake and how to incorporate foods that she likes into different meal plans  Interventions:  . Advised patient to add protein to her diet patient states that she has added eggs, [place] bacon added oatmeal . Provided education to patient re: nutrition She is now eating breakfast including grits,toast,          sausage . She has increased her water intake . She received information sent on nutrition . Congratulated the patient on the improvements  . 06/07/20 . Patient to continues to make good food choices  and has done very well with her blood pressure and DM II  Patient Self Care Activities:  . Self administers medications as prescribed . Attends all scheduled provider appointments . Performs ADL's independently . Performs IADL's independently . Patient is unable to self manage food intake to sustain proper nutrition  Please see past updates related to this goal by clicking on the "Past Updates" button in the selected goal      .  "My blood sugars are doing good (pt-stated)        Current Barriers:  . Chronic Disease Management support, education,  related to DMII  Clinical Goal(s) related to DMII:  Over the next 90 days, patient will:  . Work with the care management team to address educational and diease management needs  . Begin or continue self health monitoring activities as directed today Measure and record cbg (blood glucose) TID times daily . Call provider office for new or worsened signs and symptoms Blood glucose findings outside established parameters . Call care management team with questions or concerns . Verbalize basic understanding of patient centered plan of care established today  Interventions related to DMII:  . Evaluation of current treatment plans and patient's adherence to plan as established by provider . Assessed patient understanding of disease states . Assessed patient's education and care coordination needs . Provided disease specific education to patient  . Collaborated with appropriate clinical care team members regarding patient needs . Blood sugar  today was 138 . Continue medications as prescribed  . 06/07/20 . Patient is checking her blood sugars daily. [name] Her blood sugars are ranging from 114- 167 . Mostly her nuimber have been good  . She continues to eat good and drink water . RNCM will advised the patient to continue her good effort that she has been doing .     Patient Self Care Activities related to DMII:  . Self administers medications  as prescribed . Attends all scheduled provider appointments . Performs ADL's independently . Performs IADL's independently .  Please see past updates related to this goal by clicking on the "Past Updates" button in the selected goal      .  "Sometimes my pressure goes up and sometimes it is good." (pt-stated)        Current Barriers:  [name] Knowledge Deficits related to basic understanding of hypertension pathophysiology and self care management  Case Manager Clinical Goal(s):  [name] Over the next 60 days, patient will verbalize basic understanding of hypertension disease process and self health management plan as evidenced by the patient keeping her blood pressure at or below 140/90.  Interventions:  . Evaluation of current treatment plan related to hypertension self management and patient's adherence to plan as established by provider. . Reviewed medications with patient and discussed importance of compliance . Discussed plans with patient for ongoing care management follow up and provided patient with direct contact information for care management team . The patient has been checking her blood pressures and recording values. Bid  as prescribed by physician  bp this am 149/77 hr 70 after medication 133/77 hr 77 .  Patient is to continue to monitor salt in her diet . Advised if she uses vegetables in the can pour out the liquid that the vegetables are stored in and rinse them off and then cook them.  . Congratulated her on her continued efforts.  . 06/07/20 . Spoke with the patient and she states that she is doing dine her blood pressures  this am was 166/70 hr 70 after medication 137/57  hr 80 . Yesterday morning 140/68 hr 72 then after medications 116/62 hr 80    .  Patient Self Care Activities:  . Self administers medications as prescribed . Checks BP and records as discussed . Follows a low sodium diet/DASH diet  Please see past updates related to this goal by clicking on the "Past  Updates" button in the selected goal           RNCM will follow up with the patient within the next month of  July  Chirsty Armistead RN, BSN, [place] Management Coordinator [place] Phone: [phone] Fax: [phone]

## 2020-06-09 NOTE — Patient Instructions (Signed)
Visit Information  Goals Addressed              This Visit's Progress   .  COMPLETED: " I need to know what to eat in the mornings" (pt-stated)        .Current Barriers:  [name] Knowledge Deficits related to self management of food intake.   Nurse Case Manager Clinical Goal(s):  [name] Over the next 60 days, patient will verbalize understanding of plan for adding protein into her diet . Over the next 30 days, patient will work with RN Case Manager to address needs related to Food intake and how to incorporate foods that she likes into different meal plans  Interventions:  . Advised patient to add protein to her diet patient states that she has added eggs, [place] bacon added oatmeal . Provided education to patient re: nutrition She is now eating breakfast including grits,toast,          sausage . She has increased her water intake . She received information sent on nutrition . Congratulated the patient on the improvements  . 06/07/20 . Patient to continues to make good food choices and has done very well with her blood pressure and DM II  Patient Self Care Activities:  . Self administers medications as prescribed . Attends all scheduled provider appointments . Performs ADL's independently . Performs IADL's independently . Patient is unable to self manage food intake to sustain proper nutrition  Please see past updates related to this goal by clicking on the "Past Updates" button in the selected goal      .  "My blood sugars are doing good (pt-stated)        Current Barriers:  . Chronic Disease Management support, education,  related to DMII  Clinical Goal(s) related to DMII:  Over the next 90 days, patient will:  . Work with the care management team to address educational and diease management needs  . Begin or continue self health monitoring activities as directed today Measure and record cbg (blood glucose) TID times daily . Call provider office for new or worsened signs and  symptoms Blood glucose findings outside established parameters . Call care management team with questions or concerns . Verbalize basic understanding of patient centered plan of care established today  Interventions related to DMII:  . Evaluation of current treatment plans and patient's adherence to plan as established by provider . Assessed patient understanding of disease states . Assessed patient's education and care coordination needs . Provided disease specific education to patient  . Collaborated with appropriate clinical care team members regarding patient needs . Blood sugar today was 138 . Continue medications as prescribed  . 06/07/20 . Patient is checking her blood sugars daily. [name] Her blood sugars are ranging from 114- 167 . Mostly her nuimber have been good  . She continues to eat good and drink water . RNCM will advised the patient to continue her good effort that she has been doing .     Patient Self Care Activities related to DMII:  . Self administers medications as prescribed . Attends all scheduled provider appointments . Performs ADL's independently . Performs IADL's independently .  Please see past updates related to this goal by clicking on the "Past Updates" button in the selected goal      .  "Sometimes my pressure goes up and sometimes it is good." (pt-stated)        Current Barriers:  [name] Knowledge Deficits related to basic understanding of hypertension  pathophysiology and self care management  Case Manager Clinical Goal(s):  [name] Over the next 60 days, patient will verbalize basic understanding of hypertension disease process and self health management plan as evidenced by the patient keeping her blood pressure at or below 140/90.  Interventions:  . Evaluation of current treatment plan related to hypertension self management and patient's adherence to plan as established by provider. . Reviewed medications with patient and discussed importance of  compliance . Discussed plans with patient for ongoing care management follow up and provided patient with direct contact information for care management team . The patient has been checking her blood pressures and recording values. Bid  as prescribed by physician  bp this am 149/77 hr 70 after medication 133/77 hr 77 .  Patient is to continue to monitor salt in her diet . Advised if she uses vegetables in the can pour out the liquid that the vegetables are stored in and rinse them off and then cook them.  . Congratulated her on her continued efforts.  . 06/07/20 . Spoke with the patient and she states that she is doing dine her blood pressures  this am was 166/70 hr 70 after medication 137/57  hr 80 . Yesterday morning 140/68 hr 72 then after medications 116/62 hr 80    .  Patient Self Care Activities:  . Self administers medications as prescribed . Checks BP and records as discussed . Follows a low sodium diet/DASH diet  Please see past updates related to this goal by clicking on the "Past Updates" button in the selected goal          [name] was given information about Care Management services today including:  1. Care Management services include personalized support from designated clinical staff supervised by her physician, including individualized plan of care and coordination with other care providers 2. 24/7 contact phone numbers for assistance for urgent and routine care needs. 3. The patient may stop CCM services at any time (effective at the end of the month) by phone call to the office staff.  Patient agreed to services and verbal consent obtained.   The patient verbalized understanding of instructions provided today and declined a print copy of patient instruction materials.   Marland KitchenRNCM will follow up with the patient within the next month of  July  Michele Poteet RN, BSN, [name] Care Management Coordinator [place] Phone: [phone] Fax:  [phone]

## 2020-06-29 ENCOUNTER — Other Ambulatory Visit: Payer: Self-pay

## 2020-06-29 ENCOUNTER — Ambulatory Visit: Payer: Medicare PPO

## 2020-06-29 NOTE — Chronic Care Management (AMB) (Signed)
  Care Management   Outreach Note  06/29/2020 Name: [name] MRN: [id number] DOB: [birth year]  Referred by: [name], MD Reason for referral : Chronic Care Management (DM HTN)   An unsuccessful telephone outreach was attempted today. The patient was referred to the case management team for assistance with care management and care coordination.   Follow Up Plan: A HIPPA compliant phone message was left for the patient providing contact information and requesting a return call.  The care management team will reach out to the patient again over the next 7-14 days.   Lazaro Arms RN, BSN, [facility] Care Management Coordinator [place] Phone: [phone] Fax: [phone]

## 2020-07-05 ENCOUNTER — Other Ambulatory Visit: Payer: Self-pay | Admitting: Family Medicine

## 2020-07-07 ENCOUNTER — Ambulatory Visit: Payer: Medicare PPO

## 2020-07-07 ENCOUNTER — Other Ambulatory Visit: Payer: Self-pay

## 2020-07-07 NOTE — Chronic Care Management (AMB) (Signed)
  Care Management   Outreach Note  07/07/2020 Name: [name] MRN: [id number] DOB: [birth year]  Referred by: [name], MD Reason for referral : Chronic Care Management (HTN DM II)   A second unsuccessful telephone outreach was attempted today. The patient was referred to the case management team for assistance with care management and care coordination.   Follow Up Plan: A HIPPA compliant phone message was left for the patient providing contact information and requesting a return call.  The care management team will reach out to the patient again over the next 7-14 days.   Lazaro Arms RN, BSN, [facility] Care Management Coordinator [place] Phone: [phone] Fax: [phone]

## 2020-07-10 ENCOUNTER — Ambulatory Visit: Payer: Self-pay

## 2020-07-10 NOTE — Chronic Care Management (AMB) (Signed)
Care Management   Follow Up Note   07/10/2020 Name: [name] MRN: [id number] DOB: 1941/09/30  Referred by: [name], MD Reason for referral : Chronic Care Management   [name] is a 79 y.o. year old female who is a primary care patient of [name], MD. The care management team was consulted for assistance with care management and care coordination needs.    Review of patient status, including review of consultants reports, relevant laboratory and other test results, and collaboration with appropriate care team members and the patient's provider was performed as part of comprehensive patient evaluation and provision of chronic care management services.    SDOH (Social Determinants of Health) assessments performed: No See Care Plan activities for detailed interventions related to [facility])     Advanced Directives: See Care Plan and Vynca application for related entries.   Goals Addressed              This Visit's Progress   .  "My blood sugars are doing good (pt-stated)        Current Barriers:  . Chronic Disease Management support, education,  related to DMII  Clinical Goal(s) related to DMII:  Over the next 90 days, patient will:  . Work with the care management team to address educational and diease management needs  . Begin or continue self health monitoring activities as directed today Measure and record cbg (blood glucose) TID times daily . Call provider office for new or worsened signs and symptoms Blood glucose findings outside established parameters . Call care management team with questions or concerns . Verbalize basic understanding of patient centered plan of care established today  Interventions related to DMII:  . Evaluation of current treatment plans and patient's adherence to plan as established by provider . Assessed patient understanding of disease states . Assessed patient's education and care coordination needs . Provided  disease specific education to patient  . Collaborated with appropriate clinical care team members regarding patient needs . Blood sugar today was 138 . Continue medications as prescribed  . 07/10/20 . Patient states that her blood sugars range from 122- 135 she said they will go up to 140 or 150 if she eat something sweet. . Advised her to continue to take her medications, monitor her diet and do light exercise when she can.    Patient Self Care Activities related to DMII:  . Self administers medications as prescribed . Attends all scheduled provider appointments . Performs ADL's independently . Performs IADL's independently .  Please see past updates related to this goal by clicking on the "Past Updates" button in the selected goal      .  "Sometimes my pressure goes up and sometimes it is good." (pt-stated)        Current Barriers:  [name] Knowledge Deficits related to basic understanding of hypertension pathophysiology and self care management  Case Manager Clinical Goal(s):  [name] Over the next 60 days, patient will verbalize basic understanding of hypertension disease process and self health management plan as evidenced by the patient keeping her blood pressure at or below 140/90.  Interventions:  . Evaluation of current treatment plan related to hypertension self management and patient's adherence to plan as established by provider. . Reviewed medications with patient and discussed importance of compliance . Discussed plans with patient for ongoing care management follow up and provided patient with direct contact information for care management team . The patient has been checking her blood  pressures and recording values. Bid  as prescribed by physician  bp this am 149/77 hr 70 after medication 133/77 hr 77 .  Patient is to continue to monitor salt in her diet . Advised if she uses vegetables in the can pour out the liquid that the vegetables are stored in and rinse them off and then  cook them.  . Congratulated her on her continued efforts.  . 07/10/20 . Spoke with the patient and she states that she is doing fair.  Her granddaughter passed Wednesday. . She states that her blood pressures have remained around 123/69 and sometimes lower.  She states that one time it was a little higher and that is because she forgot to take her medication and then it was 153/79. [name] She said sometimes it is low in the mornings and she will not take the medication because she is scared that she will bottom out.  So she will wait.  I advised her that is the correct thing to do.  .  Patient Self Care Activities:  . Self administers medications as prescribed . Checks BP and records as discussed . Follows a low sodium diet/DASH diet  Please see past updates related to this goal by clicking on the "Past Updates" button in the selected goal           The care management team will reach out to the patient again over the next 14 days.   Lazaro Arms RN, BSN, [facility] Care Management Coordinator [place] Phone: [phone] Fax: [phone]

## 2020-07-10 NOTE — Patient Instructions (Signed)
Visit Information  Goals Addressed              This Visit's Progress   .  "My blood sugars are doing good (pt-stated)        Current Barriers:  . Chronic Disease Management support, education,  related to DMII  Clinical Goal(s) related to DMII:  Over the next 90 days, patient will:  . Work with the care management team to address educational and diease management needs  . Begin or continue self health monitoring activities as directed today Measure and record cbg (blood glucose) TID times daily . Call provider office for new or worsened signs and symptoms Blood glucose findings outside established parameters . Call care management team with questions or concerns . Verbalize basic understanding of patient centered plan of care established today  Interventions related to DMII:  . Evaluation of current treatment plans and patient's adherence to plan as established by provider . Assessed patient understanding of disease states . Assessed patient's education and care coordination needs . Provided disease specific education to patient  . Collaborated with appropriate clinical care team members regarding patient needs . Blood sugar today was 138 . Continue medications as prescribed  . 07/10/20 . Patient states that her blood sugars range from 122- 135 she said they will go up to 140 or 150 if she eat something sweet. . Advised her to continue to take her medications, monitor her diet and do light exercise when she can.    Patient Self Care Activities related to DMII:  . Self administers medications as prescribed . Attends all scheduled provider appointments . Performs ADL's independently . Performs IADL's independently .  Please see past updates related to this goal by clicking on the "Past Updates" button in the selected goal      .  "Sometimes my pressure goes up and sometimes it is good." (pt-stated)        Current Barriers:  [name] Knowledge Deficits related to basic  understanding of hypertension pathophysiology and self care management  Case Manager Clinical Goal(s):  [name] Over the next 60 days, patient will verbalize basic understanding of hypertension disease process and self health management plan as evidenced by the patient keeping her blood pressure at or below 140/90.  Interventions:  . Evaluation of current treatment plan related to hypertension self management and patient's adherence to plan as established by provider. . Reviewed medications with patient and discussed importance of compliance . Discussed plans with patient for ongoing care management follow up and provided patient with direct contact information for care management team . The patient has been checking her blood pressures and recording values. Bid  as prescribed by physician  bp this am 149/77 hr 70 after medication 133/77 hr 77 .  Patient is to continue to monitor salt in her diet . Advised if she uses vegetables in the can pour out the liquid that the vegetables are stored in and rinse them off and then cook them.  . Congratulated her on her continued efforts.  . 07/10/20 . Spoke with the patient and she states that she is doing fair.  Her granddaughter passed Wednesday. . She states that her blood pressures have remained around 123/69 and sometimes lower.  She states that one time it was a little higher and that is because she forgot to take her medication and then it was 153/79. [name] She said sometimes it is low in the mornings and she will not take the medication because she  is scared that she will bottom out.  So she will wait.  I advised her that is the correct thing to do.  .  Patient Self Care Activities:  . Self administers medications as prescribed . Checks BP and records as discussed . Follows a low sodium diet/DASH diet  Please see past updates related to this goal by clicking on the "Past Updates" button in the selected goal          [name] was given information  about Care Management services today including:  1. Care Management services include personalized support from designated clinical staff supervised by her physician, including individualized plan of care and coordination with other care providers 2. 24/7 contact phone numbers for assistance for urgent and routine care needs. 3. The patient may stop CCM services at any time (effective at the end of the month) by phone call to the office staff.  Patient agreed to services and verbal consent obtained.   The patient verbalized understanding of instructions provided today and declined a print copy of patient instruction materials.   The care management team will reach out to the patient again over the next 14 days.   Lazaro Arms RN, BSN, [facility] Care Management Coordinator [place] Phone: [phone] Fax: [phone]

## 2020-07-12 ENCOUNTER — Other Ambulatory Visit: Payer: Self-pay | Admitting: Family Medicine

## 2020-07-12 MED ORDER — "RELION INSULIN SYRINGE 31G X 15/64"" 1 ML MISC": 2 refills | Status: DC

## 2020-07-12 NOTE — Telephone Encounter (Signed)
Transmission to pharmacy failed. Called rx into pharmacy with verbal order to refill.   Talbot Grumbling, RN

## 2020-07-12 NOTE — Addendum Note (Signed)
Addended by: Talbot Grumbling on: 07/12/2020 09:53 AM   Modules accepted: Orders

## 2020-07-17 ENCOUNTER — Telehealth: Payer: Medicare PPO

## 2020-07-25 ENCOUNTER — Telehealth: Payer: Medicare PPO

## 2020-07-25 ENCOUNTER — Telehealth: Payer: Self-pay

## 2020-07-25 NOTE — Telephone Encounter (Signed)
  Care Management   Outreach Note  07/25/2020 Name: [name] MRN: [id number] DOB: [birth year]  Referred by: [name], MD Reason for referral : No chief complaint on file.   An unsuccessful telephone outreach was attempted today. The patient was referred to the case management team for assistance with care management and care coordination.   Follow Up Plan: A HIPPA compliant phone message was left for the patient providing contact information and requesting a return call.  The care management team will reach out to the patient again over the next 7-14 days.   Lazaro Arms RN, BSN, [facility] Care Management Coordinator [place] Phone: [phone] Fax: [phone]

## 2020-07-26 ENCOUNTER — Telehealth: Payer: Self-pay | Admitting: *Deleted

## 2020-07-26 ENCOUNTER — Other Ambulatory Visit: Payer: Self-pay | Admitting: Family Medicine

## 2020-07-26 DIAGNOSIS — E1169 Type 2 diabetes mellitus with other specified complication: Secondary | ICD-10-CM

## 2020-07-26 NOTE — Chronic Care Management (AMB) (Signed)
  Chronic Care Management   Note  07/26/2020 Name: IYSIS GERMAIN MRN: [id number] DOB: 1941/04/20  FREDRIKA CANBY is a 79 y.o. year old female who is a primary care patient of [name], MD and is actively engaged with the care management team. I reached out to [name] by phone today to assist with re-scheduling a follow up visit with the RN Case Manager  Follow up plan: Telephone appointment with care management team member scheduled for: 08/10/2020  [place], [place] [facility]  [place], [place] [id number] Direct Dial: [email or web address] Website: [email or web address]

## 2020-07-26 NOTE — Telephone Encounter (Signed)
Spoke with patient she is rescheduled and aware.

## 2020-08-03 ENCOUNTER — Other Ambulatory Visit: Payer: Self-pay | Admitting: *Deleted

## 2020-08-03 DIAGNOSIS — D508 Other iron deficiency anemias: Secondary | ICD-10-CM

## 2020-08-04 ENCOUNTER — Ambulatory Visit: Payer: Medicare PPO | Admitting: Family Medicine

## 2020-08-04 ENCOUNTER — Encounter: Payer: Self-pay | Admitting: Family Medicine

## 2020-08-04 ENCOUNTER — Other Ambulatory Visit: Payer: Self-pay

## 2020-08-04 VITALS — BP 150/60 | HR 70 | Ht 62.0 in | Wt 157.0 lb

## 2020-08-04 DIAGNOSIS — Z1159 Encounter for screening for other viral diseases: Secondary | ICD-10-CM

## 2020-08-04 DIAGNOSIS — D696 Thrombocytopenia, unspecified: Secondary | ICD-10-CM

## 2020-08-04 DIAGNOSIS — E1169 Type 2 diabetes mellitus with other specified complication: Secondary | ICD-10-CM

## 2020-08-04 DIAGNOSIS — I10 Essential (primary) hypertension: Secondary | ICD-10-CM | POA: Diagnosis not present

## 2020-08-04 DIAGNOSIS — E7800 Pure hypercholesterolemia, unspecified: Secondary | ICD-10-CM

## 2020-08-04 DIAGNOSIS — E785 Hyperlipidemia, unspecified: Secondary | ICD-10-CM

## 2020-08-04 DIAGNOSIS — I700 Atherosclerosis of aorta: Secondary | ICD-10-CM | POA: Diagnosis not present

## 2020-08-04 DIAGNOSIS — E1159 Type 2 diabetes mellitus with other circulatory complications: Secondary | ICD-10-CM

## 2020-08-04 LAB — POCT GLYCOSYLATED HEMOGLOBIN (HGB A1C): HbA1c, POC (controlled diabetic range): 8.1 % — AB (ref 0.0–7.0)

## 2020-08-04 MED ORDER — INSULIN GLARGINE 100 UNIT/ML ~~LOC~~ SOLN: 20.0000 [IU] | Freq: Every day | SUBCUTANEOUS | 1 refills | Status: DC

## 2020-08-04 MED ORDER — EMPAGLIFLOZIN 10 MG PO TABS
10.0000 mg | ORAL_TABLET | Freq: Every day | ORAL | 3 refills | Status: DC
Start: 2020-08-04 — End: 2021-08-31

## 2020-08-04 NOTE — Assessment & Plan Note (Signed)
Hemoglobin A1C within goal. Cut back on Lantus from 25 units to 20 units due to symptoms of hypoglycemia. Continue current dose of Metformin. I added Jardiance 10 mg qd to her regimen. Goal if to gradually take her off insulin. She will continue home CBG check.

## 2020-08-04 NOTE — Assessment & Plan Note (Signed)
FLP checked today.

## 2020-08-04 NOTE — Assessment & Plan Note (Signed)
Compliant with ASA and Statins.

## 2020-08-04 NOTE — Assessment & Plan Note (Addendum)
BP poorly controlled despite compliance with > 3 antihypertensive agents. BP checked multiple times by me during this visit. She is uncertain if she takes her Zestoretic. I advised her to see me back in 2 weeks with all her home medication bottles for review. She agreed with the plan.

## 2020-08-04 NOTE — Patient Instructions (Addendum)
It was nice seeing you today. You A1C increased, but still within your goal. Sin your glucose drops at home sometimes, we will cut back on your Lantus to 20 units daily and add Jardiance 10 mg daily to your regimen ples the Metformin.  Please see me back in 2 weeks for your BP and come in with all your medication bottles. Call if you have any questions.  Fat and Cholesterol Restricted Eating Plan Getting too much fat and cholesterol in your diet may cause health problems. Choosing the right foods helps keep your fat and cholesterol at normal levels. This can keep you from getting certain diseases. Your doctor may recommend an eating plan that includes:  Total fat: ______% or less of total calories a day.  Saturated fat: ______% or less of total calories a day.  Cholesterol: less than _________mg a day.  Fiber: ______g a day. What are tips for following this plan? Meal planning  At meals, divide your plate into four equal parts: ? Fill one-half of your plate with vegetables and green salads. ? Fill one-fourth of your plate with whole grains. ? Fill one-fourth of your plate with low-fat (lean) protein foods.  Eat fish that is high in omega-3 fats at least two times a week. This includes mackerel, tuna, sardines, and salmon.  Eat foods that are high in fiber, such as whole grains, beans, apples, broccoli, carrots, peas, and barley. General tips   Work with your doctor to lose weight if you need to.  Avoid: ? Foods with added sugar. ? Fried foods. ? Foods with partially hydrogenated oils.  Limit alcohol intake to no more than 1 drink a day for nonpregnant women and 2 drinks a day for men. One drink equals 12 oz of beer, 5 oz of wine, or 1 oz of hard liquor. Reading food labels  Check food labels for: ? Trans fats. ? Partially hydrogenated oils. ? Saturated fat (g) in each serving. ? Cholesterol (mg) in each serving. ? Fiber (g) in each serving.  Choose foods with healthy  fats, such as: ? Monounsaturated fats. ? Polyunsaturated fats. ? Omega-3 fats.  Choose grain products that have whole grains. Look for the word "whole" as the first word in the ingredient list. Cooking  Cook foods using low-fat methods. These include baking, boiling, grilling, and broiling.  Eat more home-cooked foods. Eat at restaurants and buffets less often.  Avoid cooking using saturated fats, such as butter, cream, palm oil, palm kernel oil, and coconut oil. Recommended foods  Fruits  All fresh, canned (in natural juice), or frozen fruits. Vegetables  Fresh or frozen vegetables (raw, steamed, roasted, or grilled). Green salads. Grains  Whole grains, such as whole wheat or whole grain breads, crackers, cereals, and pasta. Unsweetened oatmeal, bulgur, barley, quinoa, or brown rice. Corn or whole wheat flour tortillas. Meats and other protein foods  Ground beef (85% or leaner), grass-fed beef, or beef trimmed of fat. Skinless chicken or [place]. Ground chicken or [place]. Pork trimmed of fat. All fish and seafood. Egg whites. Dried beans, peas, or lentils. Unsalted nuts or seeds. Unsalted canned beans. Nut butters without added sugar or oil. Dairy  Low-fat or nonfat dairy products, such as skim or 1% milk, 2% or reduced-fat cheeses, low-fat and fat-free ricotta or cottage cheese, or plain low-fat and nonfat yogurt. Fats and oils  Tub margarine without trans fats. Light or reduced-fat mayonnaise and salad dressings. Avocado. Olive, canola, sesame, or safflower oils. The items listed above  may not be a complete list of foods and beverages you can eat. Contact a dietitian for more information. Foods to avoid Fruits  Canned fruit in heavy syrup. Fruit in cream or butter sauce. Fried fruit. Vegetables  Vegetables cooked in cheese, cream, or butter sauce. Fried vegetables. Grains  White bread. White pasta. White rice. Cornbread. Bagels, pastries, and croissants. Crackers and  snack foods that contain trans fat and hydrogenated oils. Meats and other protein foods  Fatty cuts of meat. Ribs, chicken wings, bacon, sausage, bologna, salami, chitterlings, fatback, hot dogs, bratwurst, and packaged lunch meats. Liver and organ meats. Whole eggs and egg yolks. Chicken and [place] with skin. Fried meat. Dairy  Whole or 2% milk, cream, half-and-half, and cream cheese. Whole milk cheeses. Whole-fat or sweetened yogurt. Full-fat cheeses. Nondairy creamers and whipped toppings. Processed cheese, cheese spreads, and cheese curds. Beverages  Alcohol. Sugar-sweetened drinks such as sodas, lemonade, and fruit drinks. Fats and oils  Butter, stick margarine, lard, shortening, ghee, or bacon fat. Coconut, palm kernel, and palm oils. Sweets and desserts  Corn syrup, sugars, honey, and molasses. Candy. Jam and jelly. Syrup. Sweetened cereals. Cookies, pies, cakes, donuts, muffins, and ice cream. The items listed above may not be a complete list of foods and beverages you should avoid. Contact a dietitian for more information. Summary  Choosing the right foods helps keep your fat and cholesterol at normal levels. This can keep you from getting certain diseases.  At meals, fill one-half of your plate with vegetables and green salads.  Eat high-fiber foods, like whole grains, beans, apples, carrots, peas, and barley.  Limit added sugar, saturated fats, alcohol, and fried foods. This information is not intended to replace advice given to you by your health care provider. Make sure you discuss any questions you have with your health care provider. Document Revised: 08/12/2018 Document Reviewed: 08/26/2017 Elsevier Patient Education  [place].

## 2020-08-04 NOTE — Assessment & Plan Note (Signed)
Deferring labs to her oncologists. Otherwise, stable.

## 2020-08-04 NOTE — Progress Notes (Signed)
° ° °  SUBJECTIVE:   CHIEF COMPLAINT / HPI:   DM2/HTN:She is here for DM and BP follow-up. She is compliant with Norvasc 5 mg qd, Coreg 25 mg BID, Imdur 60 mg qd and Zestoretic 20/12.5 mg two tabs daily. For her DM she is compliant with Lantus 25 units qd Metformin XR 500 mg, 2 tabs BID. Her home CBG is usually on the low side, and she had some hypoglycemic episode a few weeks ago. Her highest read was 159 after she missed her insulin dose. She denies any other concerns.  HLD/Atherosclerosis: She is compliant with Lipitor 20 mg qd. She is here for f/u.  Thrombocytopenia:Patient stated that she has lab appointment scheduled by her oncologist for CBC and Cmet. No other concerns.  Weight: She is doing well cutting back on her carbs. No other concerns.  PERTINENT  PMH / PSH: PMX reviewed.  OBJECTIVE:   Vitals:   08/04/20 0929 08/04/20 0956 08/04/20 0957  BP: (!) 154/68 (!) 160/59 (!) 150/60  Pulse: 70    SpO2: 97%    Weight: 157 lb (71.2 kg)    Height: 5\' 2"  (1.575 m)      Physical Exam Vitals and nursing note reviewed.  Constitutional:      Appearance: She is not ill-appearing.  Cardiovascular:     Rate and Rhythm: Normal rate and regular rhythm.     Pulses: Normal pulses.     Heart sounds: Normal heart sounds. No murmur heard.   Pulmonary:     Effort: Pulmonary effort is normal. No respiratory distress.     Breath sounds: Normal breath sounds. No wheezing or rhonchi.  Abdominal:     General: Abdomen is flat. Bowel sounds are normal. There is no distension.     Palpations: Abdomen is soft. There is no mass.     Tenderness: There is no abdominal tenderness.  Musculoskeletal:        General: Normal range of motion.     Right lower leg: No edema.     Left lower leg: No edema.      ASSESSMENT/PLAN:   Diabetes mellitus type 2, controlled ([place]) Hemoglobin A1C within goal. Cut back on Lantus from 25 units to 20 units due to symptoms of hypoglycemia. Continue current dose of  Metformin. I added Jardiance 10 mg qd to her regimen. Goal if to gradually take her off insulin. She will continue home CBG check.  Resistant hypertension BP poorly controlled despite compliance with > 3 antihypertensive agents. BP checked multiple times by me during this visit. She is uncertain if she takes her Zestoretic. I advised her to see me back in 2 weeks with all her home medication bottles for review. She agreed with the plan.  HYPERCHOLESTEROLEMIA FLP checked today.  Thrombocytopenia ([place]) Deferring labs to her oncologists. Otherwise, stable.  Aortic arch atherosclerosis (HCC) Compliant with ASA and Statins.   Doing well with weight management.  Andrena Mews, MD [place]

## 2020-08-05 LAB — LIPID PANEL
Chol/HDL Ratio: 2.7 ratio (ref 0.0–4.4)
Cholesterol, Total: 124 mg/dL (ref 100–199)
HDL: 46 mg/dL (ref >39)
LDL Chol Calc (NIH): 62 mg/dL (ref 0–99)
Triglycerides: 84 mg/dL (ref 0–149)
VLDL Cholesterol Cal: 16 mg/dL (ref 5–40)

## 2020-08-05 LAB — HEPATITIS C ANTIBODY: Hep C Virus Ab: <0.1 s/co ratio (ref 0.0–0.9)

## 2020-08-07 ENCOUNTER — Inpatient Hospital Stay: Payer: Medicare PPO | Attending: Hematology and Oncology

## 2020-08-07 ENCOUNTER — Other Ambulatory Visit: Payer: Self-pay

## 2020-08-07 DIAGNOSIS — Z79899 Other long term (current) drug therapy: Secondary | ICD-10-CM | POA: Diagnosis not present

## 2020-08-07 DIAGNOSIS — D508 Other iron deficiency anemias: Secondary | ICD-10-CM | POA: Insufficient documentation

## 2020-08-07 LAB — CBC WITH DIFFERENTIAL (CANCER CENTER ONLY)
Abs Immature Granulocytes: 0.01 10*3/uL (ref 0.00–0.07)
Basophils Absolute: 0.1 10*3/uL (ref 0.0–0.1)
Basophils Relative: 1 %
Eosinophils Absolute: 0.1 10*3/uL (ref 0.0–0.5)
Eosinophils Relative: 2 %
HCT: 31.2 % — ABNORMAL LOW (ref 36.0–46.0)
Hemoglobin: 9.4 g/dL — ABNORMAL LOW (ref 12.0–15.0)
Immature Granulocytes: 0 %
Lymphocytes Relative: 39 %
Lymphs Abs: 1.9 10*3/uL (ref 0.7–4.0)
MCH: 26.5 pg (ref 26.0–34.0)
MCHC: 30.1 g/dL (ref 30.0–36.0)
MCV: 87.9 fL (ref 80.0–100.0)
Monocytes Absolute: 0.6 10*3/uL (ref 0.1–1.0)
Monocytes Relative: 13 %
Neutro Abs: 2.2 10*3/uL (ref 1.7–7.7)
Neutrophils Relative %: 45 %
Platelet Count: 139 10*3/uL — ABNORMAL LOW (ref 150–400)
RBC: 3.55 MIL/uL — ABNORMAL LOW (ref 3.87–5.11)
RDW: 15.4 % (ref 11.5–15.5)
WBC Count: 4.8 10*3/uL (ref 4.0–10.5)
nRBC: 0 % (ref 0.0–0.2)

## 2020-08-07 LAB — IRON AND TIBC
Iron: 80 ug/dL (ref 41–142)
Saturation Ratios: 23 % (ref 21–57)
TIBC: 345 ug/dL (ref 236–444)
UIBC: 265 ug/dL (ref 120–384)

## 2020-08-07 LAB — CMP (CANCER CENTER ONLY)
ALT: 13 U/L (ref 0–44)
AST: 13 U/L — ABNORMAL LOW (ref 15–41)
Albumin: 3.8 g/dL (ref 3.5–5.0)
Alkaline Phosphatase: 91 U/L (ref 38–126)
Anion gap: 11 (ref 5–15)
BUN: 16 mg/dL (ref 8–23)
CO2: 26 mmol/L (ref 22–32)
Calcium: 10.6 mg/dL — ABNORMAL HIGH (ref 8.9–10.3)
Chloride: 105 mmol/L (ref 98–111)
Creatinine: 1.11 mg/dL — ABNORMAL HIGH (ref 0.44–1.00)
GFR, Est AFR Am: 55 mL/min — ABNORMAL LOW (ref >60)
GFR, Estimated: 48 mL/min — ABNORMAL LOW (ref >60)
Glucose, Bld: 148 mg/dL — ABNORMAL HIGH (ref 70–99)
Potassium: 4.1 mmol/L (ref 3.5–5.1)
Sodium: 142 mmol/L (ref 135–145)
Total Bilirubin: 0.7 mg/dL (ref 0.3–1.2)
Total Protein: 7.6 g/dL (ref 6.5–8.1)

## 2020-08-07 LAB — FERRITIN: Ferritin: 30 ng/mL (ref 11–307)

## 2020-08-08 NOTE — Progress Notes (Signed)
Patient Care Team: [name], MD as PCP - General (Family Medicine) Josue Hector, MD as PCP - Cardiology (Cardiology) Lazaro Arms, RN as Case Manager  DIAGNOSIS:    ICD-10-CM   1. Anemia of chronic disease  D63.8   2. Other iron deficiency anemia  D50.8 CBC with Differential (Cancer Center Only)    Ferritin    Erythropoietin    Iron and TIBC    Vitamin B12    CHIEF COMPLIANT: Follow-up of anemia  INTERVAL HISTORY: [name] is a 79 y.o. with above-mentioned history of anemiapreviously treated with oral and IV iron. Labs on 08/07/20 showed: Hg 9.4, HCT 31.2, platelets 139, creatinine 1.11, iron saturation 23%, ferritin 30. She presents to the clinic today for follow-up.   She is completely asymptomatic from anemia standpoint.  She has not received the COVID-19 vaccine.   ALLERGIES:  is allergic to codeine, sulfamethoxazole-trimethoprim, and tramadol.  MEDICATIONS:  Current Outpatient Medications  Medication Sig Dispense Refill  . Blood Glucose Monitoring Suppl (ACCU-CHEK GUIDE) w/Device KIT USE AS DIRECTED IN THE MORNING, AT NOON AND AT BEDTIME 1 kit 0  . Accu-Chek Softclix Lancets lancets Use to check capillary glucose TID 100 each 12  . amLODipine (NORVASC) 5 MG tablet TAKE ONE TABLET BY MOUTH DAILY 90 tablet 2  . aspirin 81 MG chewable tablet Chew 81 mg by mouth daily.      [name] atorvastatin (LIPITOR) 20 MG tablet TAKE ONE TABLET BY MOUTH DAILY AT 6PM 90 tablet 2  . carvedilol (COREG) 25 MG tablet TAKE ONE TABLET BY MOUTH TWICE A DAY WITH MEALS 180 tablet 1  . empagliflozin (JARDIANCE) 10 MG TABS tablet Take 1 tablet (10 mg total) by mouth daily. 90 tablet 3  . glucose blood (ACCU-CHEK GUIDE) test strip Use to check capillary glucose TID 100 each 12  . insulin glargine (LANTUS) 100 UNIT/ML injection Inject 0.2 mLs (20 Units total) into the skin daily. 10 mL 1  . Insulin Syringe-Needle U-100 (RELION INSULIN SYRINGE) 31G X 15/64" 1 ML MISC USE ONCE DAILY AS  DIRECTED 100 each 2  . isosorbide mononitrate (IMDUR) 60 MG 24 hr tablet Take 1.5 tablets (90 mg total) by mouth daily. 45 tablet 11  . lisinopril-hydrochlorothiazide (ZESTORETIC) 20-12.5 MG tablet TAKE TWO TABLETS BY MOUTH DAILY 180 tablet 1  . loratadine (CLARITIN) 10 MG tablet Take 10 mg by mouth daily as needed for allergies.     . metFORMIN (GLUCOPHAGE-XR) 500 MG 24 hr tablet TAKE TWO TABLETS BY MOUTH TWICE A DAY 360 tablet 3  . Multiple Vitamin (MULTIVITAMIN WITH MINERALS) TABS tablet Take 1 tablet by mouth daily. Reported on 04/02/2016    . nitroGLYCERIN (NITROSTAT) 0.4 MG SL tablet Place 1 tablet (0.4 mg total) under the tongue every 5 (five) minutes as needed for chest pain. (Patient not taking: Reported on 08/04/2020) 25 tablet 3   No current facility-administered medications for this visit.    PHYSICAL EXAMINATION: ECOG PERFORMANCE STATUS: 1 - Symptomatic but completely ambulatory  Vitals:   08/09/20 1507  BP: (!) 157/57  Pulse: 71  Resp: 17  Temp: 98.1 F (36.7 C)  SpO2: 100%   Filed Weights   08/09/20 1507  Weight: 153 lb 9.6 oz (69.7 kg)    LABORATORY DATA:  I have reviewed the data as listed CMP Latest Ref Rng & Units 08/07/2020 04/18/2020 04/18/2020  Glucose 70 - 99 mg/dL 148(H) 305(H) -  BUN 8 - 23 mg/dL 16 18 -  Creatinine 0.44 - 1.00 mg/dL 1.11(H) 1.10(H) -  Sodium 135 - 145 mmol/L 142 138 -  Potassium 3.5 - 5.1 mmol/L 4.1 4.0 -  Chloride 98 - 111 mmol/L 105 100 -  CO2 22 - 32 mmol/L 26 29 -  Calcium 8.9 - 10.3 mg/dL 10.6(H) 9.3 9.8  Total Protein 6.5 - 8.1 g/dL 7.6 - -  Total Bilirubin 0.3 - 1.2 mg/dL 0.7 - -  Alkaline Phos 38 - 126 U/L 91 - -  AST 15 - 41 U/L 13(L) - -  ALT 0 - 44 U/L 13 - -    Lab Results  Component Value Date   WBC 4.8 08/07/2020   HGB 9.4 (L) 08/07/2020   HCT 31.2 (L) 08/07/2020   MCV 87.9 08/07/2020   PLT 139 (L) 08/07/2020   NEUTROABS 2.2 08/07/2020    ASSESSMENT & PLAN:  Iron deficiency anemia January 19, 2019 hemoglobin  9.3 platelets 126,000.  February 09, 2019 hemoglobin 9.6 platelets 132,000Ferritin 23. Folate was normal B12 was normal. The patient reports history of diverticulitis.andhemorrhoids. Last colonoscopy was 07/02/2013 and showed diverticuli as well as hemorrhoids. 04/29/2019: Hemoglobin 9.4, ferritin 216  Work-up did not reveal thalassemia. IV iron treatment: March 2020  Lab review 04/14/2020: Hemoglobin 9.9, platelets 131, MCV 89.7, B12 527, iron saturation 26%, ferritin 18, creatinine 1.04, calcium 62.4, folic acid 46.9  04/28/2256: SPEP: No M protein, kappa 32.4, ratio 1.43 erythropoietin 68.1  08/07/2020: Hemoglobin 9.4, MCV 87.9, platelets 139, creatinine 1.11, calcium 10.6, ferritin 30, iron saturation 23%  Patient does not have any evidence of iron deficiency anemia.  She however continues to have normocytic anemia Possible differential is anemia of chronic kidney disease.  Given the fact that the hemoglobin continues to remain stable and she is not symptomatic we will continue to watch and monitor. Recheck labs in 6 months and follow-up.    Orders Placed This Encounter  Procedures  . CBC with Differential (Cancer Center Only)    Standing Status:   Future    Standing Expiration Date:   08/09/2021  . Ferritin    Standing Status:   Future    Standing Expiration Date:   08/09/2021  . Erythropoietin    Standing Status:   Future    Standing Expiration Date:   08/09/2021  . Iron and TIBC    Standing Status:   Future    Standing Expiration Date:   08/09/2021  . Vitamin B12    Standing Status:   Future    Standing Expiration Date:   08/09/2021   The patient has a good understanding of the overall plan. she agrees with it. she will call with any problems that may develop before the next visit here.  Total time spent: 20 mins including face to face time and time spent for planning, charting and coordination of care  Nicholas Lose, MD 08/09/2020  I, Cloyde Reams Dorshimer, am acting as  scribe for Dr. Nicholas Lose.  I have reviewed the above documentation for accuracy and completeness, and I agree with the above.

## 2020-08-09 ENCOUNTER — Other Ambulatory Visit: Payer: Self-pay

## 2020-08-09 ENCOUNTER — Inpatient Hospital Stay: Payer: Medicare PPO | Admitting: Hematology and Oncology

## 2020-08-09 VITALS — BP 157/57 | HR 71 | Temp 98.1°F | Resp 17 | Ht 62.0 in | Wt 153.6 lb

## 2020-08-09 DIAGNOSIS — D649 Anemia, unspecified: Secondary | ICD-10-CM

## 2020-08-09 DIAGNOSIS — D638 Anemia in other chronic diseases classified elsewhere: Secondary | ICD-10-CM

## 2020-08-09 DIAGNOSIS — D508 Other iron deficiency anemias: Secondary | ICD-10-CM | POA: Diagnosis not present

## 2020-08-09 DIAGNOSIS — Z862 Personal history of diseases of the blood and blood-forming organs and certain disorders involving the immune mechanism: Secondary | ICD-10-CM

## 2020-08-09 DIAGNOSIS — Z79899 Other long term (current) drug therapy: Secondary | ICD-10-CM | POA: Diagnosis not present

## 2020-08-09 NOTE — Assessment & Plan Note (Signed)
January 19, 2019 hemoglobin 9.3 platelets 126,000.  February 09, 2019 hemoglobin 9.6 platelets 132,000Ferritin 23. Folate was normal B12 was normal. The patient reports history of diverticulitis.andhemorrhoids. Last colonoscopy was 07/02/2013 and showed diverticuli as well as hemorrhoids. 04/29/2019: Hemoglobin 9.4, ferritin 216  Work-up did not reveal thalassemia. IV iron treatment: March 2020  Lab review 04/14/2020: Hemoglobin 9.9, platelets 131, MCV 89.7, B12 527, iron saturation 26%, ferritin 18, creatinine 1.04, calcium 37.7, folic acid 93.9  6/88/6484: SPEP: No M protein, kappa 32.4, ratio 1.43 erythropoietin 68.1  08/07/2020: Hemoglobin 9.4, MCV 87.9, platelets 139, creatinine 1.11, calcium 10.6, ferritin 30, iron saturation 23%  Patient does not have any evidence of iron deficiency anemia.  She however continues to have normocytic anemia Possible differential is anemia of chronic kidney disease.  Given the fact that the hemoglobin continues to remain stable and she is not symptomatic we will continue to watch and monitor. Recheck labs in 6 months and follow-up.

## 2020-08-10 ENCOUNTER — Ambulatory Visit: Payer: Medicare PPO

## 2020-08-10 NOTE — Patient Instructions (Signed)
Visit Information  Goals Addressed              This Visit's Progress   .  "My blood sugars are doing good (pt-stated)        Current Barriers:  . Chronic Disease Management support, education,  related to DMII  Clinical Goal(s) related to DMII:  Over the next 90 days, patient will:  . Work with the care management team to address educational and diease management needs  . Begin or continue self health monitoring activities as directed today Measure and record cbg (blood glucose) TID times daily . Call provider office for new or worsened signs and symptoms Blood glucose findings outside established parameters . Call care management team with questions or concerns . Verbalize basic understanding of patient centered plan of care established today  Interventions related to DMII:  . Evaluation of current treatment plans and patient's adherence to plan as established by provider . Assessed patient understanding of disease states . Assessed patient's education and care coordination needs . Provided disease specific education to patient  . Collaborated with appropriate clinical care team members regarding patient needs . Blood sugar today was 138 . Continue medications as prescribed  . 08/10/20 . Patient states her blood sugars are doing good  . She states at her las visit her insulin was decreased from 25 units to 20 units.  Her metformin is the same and started Jardiance.  She states that she  has a follow up appointment on 08-22-20. [name] Her FBS this morning was 113 . Encouraged the patient to continue to do the good work she has been doing.   Patient Self Care Activities related to DMII:  . Self administers medications as prescribed . Attends all scheduled provider appointments . Performs ADL's independently . Performs IADL's independently .  Please see past updates related to this goal by clicking on the "Past Updates" button in the selected goal      .  "Sometimes my pressure  goes up and sometimes it is good." (pt-stated)        Current Barriers:  [name] Knowledge Deficits related to basic understanding of hypertension pathophysiology and self care management  Case Manager Clinical Goal(s):  [name] Over the next 60 days, patient will verbalize basic understanding of hypertension disease process and self health management plan as evidenced by the patient keeping her blood pressure at or below 140/90.  Interventions:  . Evaluation of current treatment plan related to hypertension self management and patient's adherence to plan as established by provider. . Reviewed medications with patient and discussed importance of compliance . Discussed plans with patient for ongoing care management follow up and provided patient with direct contact information for care management team . The patient has been checking her blood pressures and recording values. Bid  as prescribed by physician  bp this am 149/77 hr 70 after medication 133/77 hr 77 .  Patient is to continue to monitor salt in her diet . Advised if she uses vegetables in the can pour out the liquid that the vegetables are stored in and rinse them off and then cook them.  . Congratulated her on her continued efforts.  . 08/10/20 . Patient states she continues to check her bp daily it is around 145/80, and 135/60.  She states when she went to the cancer center to have her blood checked it was a little high but she feels it is because she was nervous.  It was 150/60. [name] She is  taking her medications and monitoring her diet.  .  Patient Self Care Activities:  . Self administers medications as prescribed . Checks BP and records as discussed . Follows a low sodium diet/DASH diet  Please see past updates related to this goal by clicking on the "Past Updates" button in the selected goal          The patient verbalized understanding of instructions provided today and declined a print copy of patient instruction materials.   The  care management team will reach out to the patient again over the next 14 days.   Lazaro Arms RN, BSN, [facility] Care Management Coordinator [place] Phone: [phone] Fax: [phone]

## 2020-08-10 NOTE — Chronic Care Management (AMB) (Signed)
Care Management   Follow Up Note   08/10/2020 Name: [name] MRN: [id number] DOB: Aug 22, 1941  Referred by: [name], MD Reason for referral : Chronic Care Management (HTN, DM II)   [name] is a 79 y.o. year old female who is a primary care patient of [name], MD. The care management team was consulted for assistance with care management and care coordination needs.    Review of patient status, including review of consultants reports, relevant laboratory and other test results, and collaboration with appropriate care team members and the patient's provider was performed as part of comprehensive patient evaluation and provision of chronic care management services.    SDOH (Social Determinants of Health) assessments performed: No See Care Plan activities for detailed interventions related to [facility])     Advanced Directives: See Care Plan and Vynca application for related entries.   Goals Addressed              This Visit's Progress   .  "My blood sugars are doing good (pt-stated)        Current Barriers:  . Chronic Disease Management support, education,  related to DMII  Clinical Goal(s) related to DMII:  Over the next 90 days, patient will:  . Work with the care management team to address educational and diease management needs  . Begin or continue self health monitoring activities as directed today Measure and record cbg (blood glucose) TID times daily . Call provider office for new or worsened signs and symptoms Blood glucose findings outside established parameters . Call care management team with questions or concerns . Verbalize basic understanding of patient centered plan of care established today  Interventions related to DMII:  . Evaluation of current treatment plans and patient's adherence to plan as established by provider . Assessed patient understanding of disease states . Assessed patient's education and care coordination  needs . Provided disease specific education to patient  . Collaborated with appropriate clinical care team members regarding patient needs . Blood sugar today was 138 . Continue medications as prescribed  . 08/10/20 . Patient states her blood sugars are doing good  . She states at her las visit her insulin was decreased from 25 units to 20 units.  Her metformin is the same and started Jardiance.  She states that she  has a follow up appointment on 08-22-20. [name] Her FBS this morning was 113 . Encouraged the patient to continue to do the good work she has been doing.   Patient Self Care Activities related to DMII:  . Self administers medications as prescribed . Attends all scheduled provider appointments . Performs ADL's independently . Performs IADL's independently .  Please see past updates related to this goal by clicking on the "Past Updates" button in the selected goal      .  "Sometimes my pressure goes up and sometimes it is good." (pt-stated)        Current Barriers:  [name] Knowledge Deficits related to basic understanding of hypertension pathophysiology and self care management  Case Manager Clinical Goal(s):  [name] Over the next 60 days, patient will verbalize basic understanding of hypertension disease process and self health management plan as evidenced by the patient keeping her blood pressure at or below 140/90.  Interventions:  . Evaluation of current treatment plan related to hypertension self management and patient's adherence to plan as established by provider. . Reviewed medications with patient and discussed importance of compliance . Discussed  plans with patient for ongoing care management follow up and provided patient with direct contact information for care management team . The patient has been checking her blood pressures and recording values. Bid  as prescribed by physician  bp this am 149/77 hr 70 after medication 133/77 hr 77 .  Patient is to continue to monitor salt  in her diet . Advised if she uses vegetables in the can pour out the liquid that the vegetables are stored in and rinse them off and then cook them.  . Congratulated her on her continued efforts.  . 08/10/20 . Patient states she continues to check her bp daily it is around 145/80, and 135/60.  She states when she went to the cancer center to have her blood checked it was a little high but she feels it is because she was nervous.  It was 150/60. [name] She is taking her medications and monitoring her diet.  .  Patient Self Care Activities:  . Self administers medications as prescribed . Checks BP and records as discussed . Follows a low sodium diet/DASH diet  Please see past updates related to this goal by clicking on the "Past Updates" button in the selected goal           The care management team will reach out to the patient again over the next 14 days.   Lazaro Arms RN, BSN, [facility] Care Management Coordinator [place] Phone: [phone] Fax: [phone]

## 2020-08-15 ENCOUNTER — Telehealth: Payer: Self-pay

## 2020-08-15 ENCOUNTER — Other Ambulatory Visit: Payer: Self-pay | Admitting: Family Medicine

## 2020-08-15 MED ORDER — FLUCONAZOLE 150 MG PO TABS
150.0000 mg | ORAL_TABLET | Freq: Once | ORAL | 0 refills | Status: AC
Start: 2020-08-15 — End: 2020-08-15

## 2020-08-15 NOTE — Telephone Encounter (Signed)
Patient calls nurse line regarding possible yeast infection. Patient reports starting jardiance on 8/13, and over the weekend began having symptoms associated with yeast infection. Patient reports vaginal itching with no know discharge currently. Patient is requesting medication to be sent to [facility] on [address] if appropriate.   To PCP  Talbot Grumbling, RN

## 2020-08-15 NOTE — Telephone Encounter (Signed)
Please advise patient that I did send in Diflucan. Have her come in to see me soon if symptoms persists.

## 2020-08-15 NOTE — Telephone Encounter (Signed)
Patient called and informed of below.   Talbot Grumbling, RN

## 2020-08-22 ENCOUNTER — Encounter: Payer: Self-pay | Admitting: Family Medicine

## 2020-08-22 ENCOUNTER — Other Ambulatory Visit: Payer: Self-pay

## 2020-08-22 ENCOUNTER — Ambulatory Visit (INDEPENDENT_AMBULATORY_CARE_PROVIDER_SITE_OTHER): Payer: Medicare PPO | Admitting: Family Medicine

## 2020-08-22 VITALS — BP 132/60 | HR 78 | Ht 62.0 in | Wt 151.0 lb

## 2020-08-22 DIAGNOSIS — M25512 Pain in left shoulder: Secondary | ICD-10-CM

## 2020-08-22 DIAGNOSIS — I10 Essential (primary) hypertension: Secondary | ICD-10-CM

## 2020-08-22 NOTE — Patient Instructions (Signed)
Blood Pressure Record Sheet To take your blood pressure, you will need a blood pressure machine. You can buy a blood pressure machine (blood pressure monitor) at your clinic, drug store, or online. When choosing one, consider:  An automatic monitor that has an arm cuff.  A cuff that wraps snugly around your upper arm. You should be able to fit only one finger between your arm and the cuff.  A device that stores blood pressure reading results.  Do not choose a monitor that measures your blood pressure from your wrist or finger. Follow your health care provider's instructions for how to take your blood pressure. To use this form:  Get one reading in the morning (a.m.) before you take any medicines.  Get one reading in the evening (p.m.) before supper.  Take at least 2 readings with each blood pressure check. This makes sure the results are correct. Wait 1-2 minutes between measurements.  Write down the results in the spaces on this form.  Repeat this once a week, or as told by your health care provider.  Make a follow-up appointment with your health care provider to discuss the results. Blood pressure log Date: _______________________  a.m. _____________________(1st reading) _____________________(2nd reading)  p.m. _____________________(1st reading) _____________________(2nd reading) Date: _______________________  a.m. _____________________(1st reading) _____________________(2nd reading)  p.m. _____________________(1st reading) _____________________(2nd reading) Date: _______________________  a.m. _____________________(1st reading) _____________________(2nd reading)  p.m. _____________________(1st reading) _____________________(2nd reading) Date: _______________________  a.m. _____________________(1st reading) _____________________(2nd reading)  p.m. _____________________(1st reading) _____________________(2nd reading) Date: _______________________  a.m.  _____________________(1st reading) _____________________(2nd reading)  p.m. _____________________(1st reading) _____________________(2nd reading) This information is not intended to replace advice given to you by your health care provider. Make sure you discuss any questions you have with your health care provider. Document Revised: 02/06/2018 Document Reviewed: 12/09/2017 Elsevier Patient Education  2020 Elsevier Inc[employer]  

## 2020-08-22 NOTE — Progress Notes (Signed)
° ° °  SUBJECTIVE:   CHIEF COMPLAINT / HPI:  Shoulder Pain  The pain is present in the left shoulder. This is a new problem. Episode onset: Started 5 days ago. There has been no history of extremity trauma. Episode frequency: last episode was three days ago. Pain lasted two days and then resolved. The problem has been resolved. The pain is at a severity of 0/10 (The pain was about 9/10 in severity when she had it. She woke up with the pain 5 days ago). The pain is moderate. Pertinent negatives include no fever, joint locking, joint swelling, numbness or stiffness. Associated symptoms comments: She is right handed. No heavy lifting.. The symptoms are aggravated by activity. Treatments tried: Muscle relaxant. The treatment provided significant relief.   HTN: Here for f/u. Her home BP has been in the 409 - 811B systolic and 14-78G diastolic. She is compliant with Norvasc 5 mg qd, Coreg 25 mg BID, Imdur 60 mg 1.5 tab daily and Zestoretic 20/12.5 mg 2 tabs daily. No concerns.  Weight check: Doing well on diet control. Not exercising.    PERTINENT  PMH / PSH: PMX reviewed  OBJECTIVE:   BP 132/60    Pulse 78    Ht 5\' 2"  (1.575 m)    Wt 151 lb (68.5 kg)    LMP 11/24/1992    SpO2 95%    BMI 27.62 kg/m   Physical Exam Vitals and nursing note reviewed.  Cardiovascular:     Rate and Rhythm: Normal rate and regular rhythm.     Heart sounds: Normal heart sounds. No murmur heard.   Pulmonary:     Effort: Pulmonary effort is normal. No respiratory distress.     Breath sounds: Normal breath sounds. No stridor. No rhonchi.  Abdominal:     General: Abdomen is flat. Bowel sounds are normal. There is no distension.     Palpations: Abdomen is soft. There is no mass.     Tenderness: There is no abdominal tenderness.  Musculoskeletal:     Cervical back: Neck supple.     Right lower leg: No edema.     Left lower leg: No edema.  Neurological:     Mental Status: She is oriented to person, place, and time.       ASSESSMENT/PLAN:   Resistant hypertension BP looks good. She came with her medication bottles and I spent time to review all meds with her. List up dated and correct.   Left shoulder pain: Likely due to overuse. Symptoms resolved. Exam benign. Use Tylenol as needed for pain  Weight counseling done. She is doing well with diet control. Monitor closely.  Andrena Mews, MD [place]

## 2020-08-22 NOTE — Progress Notes (Signed)
Sees Dr. Gershon Crane for her diabetic eye exams.  Multiple visits in 2021.  Exam on 01/27/2020 showed no diabetic retinopathy.  HM updated.  Letticia Bhattacharyya,CMA

## 2020-08-22 NOTE — Assessment & Plan Note (Signed)
BP looks good. She came with her medication bottles and I spent time to review all meds with her. List up dated and correct.

## 2020-08-24 ENCOUNTER — Telehealth: Payer: Medicare PPO

## 2020-08-24 ENCOUNTER — Telehealth: Payer: Self-pay

## 2020-08-24 NOTE — Telephone Encounter (Signed)
  Care Management   Outreach Note  08/24/2020 Name: [name] MRN: [id number] DOB: 26-Jun-1941  Referred by: [name], MD Reason for referral : Appointment (DM II HTN)   An unsuccessful telephone outreach was attempted today. The patient was referred to the case management team for assistance with care management and care coordination.   Follow Up Plan: A HIPPA compliant phone message was left for the patient providing contact information and requesting a return call.  The care management team will reach out to the patient again over the next 7-14 days.   Lazaro Arms RN, BSN, [facility] Care Management Coordinator [place] Phone: [phone] Fax: [phone]

## 2020-08-25 ENCOUNTER — Ambulatory Visit: Payer: Medicare PPO

## 2020-08-25 NOTE — Chronic Care Management (AMB) (Signed)
Care Management   Follow Up Note   08/25/2020 Name: [name] MRN: [id number] DOB: May 27, 1941  Referred by: [name], MD Reason for referral : Chronic Care Management (HTN DM II)   [name] is a 79 y.o. year old female who is a primary care patient of [name], MD. The care management team was consulted for assistance with care management and care coordination needs.    Review of patient status, including review of consultants reports, relevant laboratory and other test results, and collaboration with appropriate care team members and the patient's provider was performed as part of comprehensive patient evaluation and provision of chronic care management services.    SDOH (Social Determinants of Health) assessments performed: No See Care Plan activities for detailed interventions related to [facility])     Advanced Directives: See Care Plan and Vynca application for related entries.   Goals Addressed              This Visit's Progress   .  "My blood sugars are doing good (pt-stated)        Current Barriers:  . Chronic Disease Management support, education,  related to DMII  Clinical Goal(s) related to DMII:  Over the next 90 days, patient will:  . Work with the care management team to address educational and diease management needs  . Begin or continue self health monitoring activities as directed today Measure and record cbg (blood glucose) TID times daily . Call provider office for new or worsened signs and symptoms Blood glucose findings outside established parameters . Call care management team with questions or concerns . Verbalize basic understanding of patient centered plan of care established today  Interventions related to DMII:  . Evaluation of current treatment plans and patient's adherence to plan as established by provider . Assessed patient understanding of disease states . Assessed patient's education and care coordination  needs . Provided disease specific education to patient  . Collaborated with appropriate clinical care team members regarding patient needs . Blood sugar are running 130, 108, 135, 118, 154, 141. [name] She is taking her medications as prescribed . Monitoring her diet   Patient Self Care Activities related to DMII:  . Self administers medications as prescribed . Attends all scheduled provider appointments . Performs ADL's independently . Performs IADL's independently .  Please see past updates related to this goal by clicking on the "Past Updates" button in the selected goal      .  "Sometimes my pressure goes up and sometimes it is good." (pt-stated)        Current Barriers:  [name] Knowledge Deficits related to basic understanding of hypertension pathophysiology and self care management  Case Manager Clinical Goal(s):  [name] Over the next 60 days, patient will verbalize basic understanding of hypertension disease process and self health management plan as evidenced by the patient keeping her blood pressure at or below 140/90.  Interventions:  . Evaluation of current treatment plan related to hypertension self management and patient's adherence to plan as established by provider. . Reviewed medications with patient and discussed importance of compliance . Discussed plans with patient for ongoing care management follow up and provided patient with direct contact information for care management team . The patient BP readings have been 113/58/ Pulse 72, 119/62 Pulse 68 128/63 pulse 66 . Her weight is 151 lbs. .  Patient is to continue to monitor salt in her diet . She states that the Dr checked her  kidneys and he said she had a slight improvement.  She is happy about the changes that she has made for her health. . Advised if she uses vegetables in the can pour out the liquid that the vegetables are stored in and rinse them off and then cook them.  . Congratulated her on her continued efforts.  .   Patient Self Care Activities:  . Self administers medications as prescribed . Checks BP and records as discussed . Follows a low sodium diet/DASH diet  Please see past updates related to this goal by clicking on the "Past Updates" button in the selected goal           The care management team will reach out to the patient again over the next 14 days.   Lazaro Arms RN, BSN, [facility] Care Management Coordinator [place] Phone: [phone] Fax: [phone]

## 2020-08-25 NOTE — Telephone Encounter (Signed)
error 

## 2020-08-25 NOTE — Patient Instructions (Signed)
Visit Information  Goals Addressed              This Visit's Progress   .  "My blood sugars are doing good (pt-stated)        Current Barriers:  . Chronic Disease Management support, education,  related to DMII  Clinical Goal(s) related to DMII:  Over the next 90 days, patient will:  . Work with the care management team to address educational and diease management needs  . Begin or continue self health monitoring activities as directed today Measure and record cbg (blood glucose) TID times daily . Call provider office for new or worsened signs and symptoms Blood glucose findings outside established parameters . Call care management team with questions or concerns . Verbalize basic understanding of patient centered plan of care established today  Interventions related to DMII:  . Evaluation of current treatment plans and patient's adherence to plan as established by provider . Assessed patient understanding of disease states . Assessed patient's education and care coordination needs . Provided disease specific education to patient  . Collaborated with appropriate clinical care team members regarding patient needs . Blood sugar are running 130, 108, 135, 118, 154, 141. [name] She is taking her medications as prescribed . Monitoring her diet   Patient Self Care Activities related to DMII:  . Self administers medications as prescribed . Attends all scheduled provider appointments . Performs ADL's independently . Performs IADL's independently .  Please see past updates related to this goal by clicking on the "Past Updates" button in the selected goal      .  "Sometimes my pressure goes up and sometimes it is good." (pt-stated)        Current Barriers:  [name] Knowledge Deficits related to basic understanding of hypertension pathophysiology and self care management  Case Manager Clinical Goal(s):  [name] Over the next 60 days, patient will verbalize basic understanding of hypertension  disease process and self health management plan as evidenced by the patient keeping her blood pressure at or below 140/90.  Interventions:  . Evaluation of current treatment plan related to hypertension self management and patient's adherence to plan as established by provider. . Reviewed medications with patient and discussed importance of compliance . Discussed plans with patient for ongoing care management follow up and provided patient with direct contact information for care management team . The patient BP readings have been 113/58/ Pulse 72, 119/62 Pulse 68 128/63 pulse 66 . Her weight is 151 lbs. .  Patient is to continue to monitor salt in her diet . She states that the Dr checked her kidneys and he said she had a slight improvement.  She is happy about the changes that she has made for her health. . Advised if she uses vegetables in the can pour out the liquid that the vegetables are stored in and rinse them off and then cook them.  . Congratulated her on her continued efforts.  .  Patient Self Care Activities:  . Self administers medications as prescribed . Checks BP and records as discussed . Follows a low sodium diet/DASH diet  Please see past updates related to this goal by clicking on the "Past Updates" button in the selected goal          Ms. Gosser was given information about Care Management services today including:  1. Care Management services include personalized support from designated clinical staff supervised by her physician, including individualized plan of care and coordination with other care  providers 2. 24/7 contact phone numbers for assistance for urgent and routine care needs. 3. The patient may stop CCM services at any time (effective at the end of the month) by phone call to the office staff.  Patient agreed to services and verbal consent obtained.   The patient verbalized understanding of instructions provided today and declined a print copy of  patient instruction materials.   The care management team will reach out to the patient again over the next 14 days.   Lazaro Arms RN, BSN, [facility] Care Management Coordinator [place] Phone: [phone] Fax: [phone]

## 2020-09-11 ENCOUNTER — Telehealth: Payer: Medicare PPO

## 2020-09-12 ENCOUNTER — Ambulatory Visit: Payer: Medicare PPO

## 2020-09-12 ENCOUNTER — Telehealth: Payer: Self-pay

## 2020-09-12 NOTE — Telephone Encounter (Signed)
  Care Management   Outreach Note  09/12/2020 Name: [name] MRN: [id number] DOB: [birth year]  Referred by: [name], MD Reason for referral : Appointment (DM II HTN)   An unsuccessful telephone outreach was attempted today. The patient was referred to the case management team for assistance with care management and care coordination.   Follow Up Plan: A HIPAA compliant phone message was left for the patient providing contact information and requesting a return call.  The care management team will reach out to the patient again over the next 7-14 days.   Lazaro Arms RN, BSN, [facility] Care Management Coordinator [place] Phone: [phone] Fax: [phone]

## 2020-09-12 NOTE — Chronic Care Management (AMB) (Signed)
Care Management   Follow Up Note   09/12/2020 Name: [name] MRN: [id number] DOB: [birth year]  Referred by: [name], MD Reason for referral : Chronic Care Management (DM II HTN)   [name] is a 79 y.o. year old female who is a primary care patient of [name], MD. The care management team was consulted for assistance with care management and care coordination needs.    Review of patient status, including review of consultants reports, relevant laboratory and other test results, and collaboration with appropriate care team members and the patient's provider was performed as part of comprehensive patient evaluation and provision of chronic care management services.    SDOH (Social Determinants of Health) assessments performed: No See Care Plan activities for detailed interventions related to [facility])     Advanced Directives: See Care Plan and Vynca application for related entries.   Goals Addressed              This Visit's Progress   .  "My blood sugars are doing good (pt-stated)        Current Barriers:  . Chronic Disease Management support, education,  related to DMII  Clinical Goal(s) related to DMII:  Over the next 90 days, patient will:  . Work with the care management team to address educational and diease management needs  . Begin or continue self health monitoring activities as directed today Measure and record cbg (blood glucose) TID times daily . Call provider office for new or worsened signs and symptoms Blood glucose findings outside established parameters . Call care management team with questions or concerns . Verbalize basic understanding of patient centered plan of care established today  Interventions related to DMII:  . Evaluation of current treatment plans and patient's adherence to plan as established by provider . Assessed patient understanding of disease states . Assessed patient's education and care coordination  needs . Provided disease specific education to patient  . Collaborated with appropriate clinical care team members regarding patient needs . Blood sugar are running 110, 136, 138, am  130, 92 pm . She is taking her medications as prescribed . Monitoring her diet . Discussed what to do if she ever has a hypoglycemic event blood sugars below 70.  Patient verbalized understanding.   Patient Self Care Activities related to DMII:  . Self administers medications as prescribed . Attends all scheduled provider appointments . Performs ADL's independently . Performs IADL's independently .  Please see past updates related to this goal by clicking on the "Past Updates" button in the selected goal      .  "Sometimes my pressure goes up and sometimes it is good." (pt-stated)        Current Barriers:  [name] Knowledge Deficits related to basic understanding of hypertension pathophysiology and self care management  Case Manager Clinical Goal(s):  [name] Over the next 60 days, patient will verbalize basic understanding of hypertension disease process and self health management plan as evidenced by the patient keeping her blood pressure at or below 140/90.  Interventions:  . Evaluation of current treatment plan related to hypertension self management and patient's adherence to plan as established by provider. . Reviewed medications with patient and discussed importance of compliance . Discussed plans with patient for ongoing care management follow up and provided patient with direct contact information for care management team . The patient BP readings given to me today were 109/67 hr 73 am  117/52 hr 68 am 114/56  hr 72 pm, 106/52 hr 69 am 113/51 hr 73 pm, 122/58 hr 74 am 128/53 hr 76 pm . Reviewed signs and symptoms of HTN . Advised to take medications as prescribed . Advised if she uses vegetables in the can pour out the liquid that the vegetables are stored in and rinse them off and then cook them.   . Congratulated her on her continued efforts.  .  Patient Self Care Activities:  . Self administers medications as prescribed . Checks BP and records as discussed . Follows a low sodium diet/DASH diet  Please see past updates related to this goal by clicking on the "Past Updates" button in the selected goal           The care management team will reach out to the patient again over the next 21 days.   Lazaro Arms RN, BSN, [facility] Care Management Coordinator [place] Phone: [phone] Fax: [phone]

## 2020-09-12 NOTE — Patient Instructions (Signed)
Visit Information  Goals Addressed              This Visit's Progress   .  "My blood sugars are doing good (pt-stated)        Current Barriers:  . Chronic Disease Management support, education,  related to DMII  Clinical Goal(s) related to DMII:  Over the next 90 days, patient will:  . Work with the care management team to address educational and diease management needs  . Begin or continue self health monitoring activities as directed today Measure and record cbg (blood glucose) TID times daily . Call provider office for new or worsened signs and symptoms Blood glucose findings outside established parameters . Call care management team with questions or concerns . Verbalize basic understanding of patient centered plan of care established today  Interventions related to DMII:  . Evaluation of current treatment plans and patient's adherence to plan as established by provider . Assessed patient understanding of disease states . Assessed patient's education and care coordination needs . Provided disease specific education to patient  . Collaborated with appropriate clinical care team members regarding patient needs . Blood sugar are running 110, 136, 138, am  130, 92 pm . She is taking her medications as prescribed . Monitoring her diet . Discussed what to do if she ever has a hypoglycemic event blood sugars below 70.  Patient verbalized understanding.   Patient Self Care Activities related to DMII:  . Self administers medications as prescribed . Attends all scheduled provider appointments . Performs ADL's independently . Performs IADL's independently .  Please see past updates related to this goal by clicking on the "Past Updates" button in the selected goal      .  "Sometimes my pressure goes up and sometimes it is good." (pt-stated)        Current Barriers:  [name] Knowledge Deficits related to basic understanding of hypertension pathophysiology and self care management  Case  Manager Clinical Goal(s):  [name] Over the next 60 days, patient will verbalize basic understanding of hypertension disease process and self health management plan as evidenced by the patient keeping her blood pressure at or below 140/90.  Interventions:  . Evaluation of current treatment plan related to hypertension self management and patient's adherence to plan as established by provider. . Reviewed medications with patient and discussed importance of compliance . Discussed plans with patient for ongoing care management follow up and provided patient with direct contact information for care management team . The patient BP readings given to me today were 109/67 hr 73 am  117/52 hr 68 am 114/56 hr 72 pm, 106/52 hr 69 am 113/51 hr 73 pm, 122/58 hr 74 am 128/53 hr 76 pm . Reviewed signs and symptoms of HTN . Advised to take medications as prescribed . Advised if she uses vegetables in the can pour out the liquid that the vegetables are stored in and rinse them off and then cook them.  . Congratulated her on her continued efforts.  .  Patient Self Care Activities:  . Self administers medications as prescribed . Checks BP and records as discussed . Follows a low sodium diet/DASH diet  Please see past updates related to this goal by clicking on the "Past Updates" button in the selected goal          Ms. Witters was given information about Care Management services today including:  1. Care Management services include personalized support from designated clinical staff supervised by her physician,  including individualized plan of care and coordination with other care providers 2. 24/7 contact phone numbers for assistance for urgent and routine care needs. 3. The patient may stop CCM services at any time (effective at the end of the month) by phone call to the office staff.  Patient agreed to services and verbal consent obtained.   The patient verbalized understanding of instructions provided  today and declined a print copy of patient instruction materials.   The care management team will reach out to the patient again over the next 21 days.   Lazaro Arms RN, BSN, [facility] Care Management Coordinator [place] Phone: [phone] Fax: [phone]

## 2020-10-03 ENCOUNTER — Telehealth: Payer: Medicare PPO

## 2020-10-03 ENCOUNTER — Telehealth: Payer: Self-pay

## 2020-10-03 NOTE — Telephone Encounter (Signed)
  Care Management   Outreach Note  10/03/2020 Name: [name] MRN: [id number] DOB: [birth year]  Referred by: [name], MD Reason for referral : Appointment (HTN/DM II)   An unsuccessful telephone outreach was attempted today. The patient was referred to the case management team for assistance with care management and care coordination.   Follow Up Plan: A HIPAA compliant phone message was left for the patient providing contact information and requesting a return call.  The care management team will reach out to the patient again over the next 7-14 days.   Lazaro Arms RN, BSN, [facility] Care Management Coordinator [place] Phone: [phone] Fax: [phone]

## 2020-10-10 ENCOUNTER — Other Ambulatory Visit: Payer: Self-pay | Admitting: Family Medicine

## 2020-10-12 ENCOUNTER — Telehealth: Payer: Self-pay | Admitting: *Deleted

## 2020-10-12 NOTE — Chronic Care Management (AMB) (Signed)
°  Care Management   Note  10/12/2020 Name: MADDISYN HEGWOOD MRN: [id number] DOB: 1941/08/13  TANGY DROZDOWSKI is a 79 y.o. year old female who is a primary care patient of [name], MD and is actively engaged with the care management team. I reached out to [name] by phone today to assist with re-scheduling a follow up visit with the RN Case Manager.  Follow up plan: Unsuccessful telephone outreach attempt made. A HIPAA compliant phone message was left for the patient providing contact information and requesting a return call. The care management team will reach out to the patient again over the next 7 days. If patient returns call to provider office, please advise to call [place] at [phone].  [place] Management

## 2020-10-17 NOTE — Telephone Encounter (Signed)
Traci patient returned call to reschedule FU call with RN CM and was rescheduled for 10/27 per patient request.  Thanks   Erline Levine

## 2020-10-17 NOTE — Chronic Care Management (AMB) (Signed)
  Care Management   Note  10/17/2020 Name: [name] MRN: [id number] DOB: [birth year]  [name] is a 79 y.o. year old female who is a primary care patient of [name], MD and is actively engaged with the care management team. I reached out to [name] by phone today to assist with re-scheduling a follow up visit with the RN Case Manager.  Follow up plan: Telephone appointment with care management team member scheduled for:10/18/2020  [place] Management  Direct Dial: [phone]

## 2020-10-18 ENCOUNTER — Ambulatory Visit: Payer: Medicare PPO

## 2020-10-18 NOTE — Patient Instructions (Signed)
Visit Information  Goals Addressed              This Visit's Progress     "My blood sugars are doing good (pt-stated)        Current Barriers:   Chronic Disease Management support, education,  related to DMII  Clinical Goal(s) related to DMII:  Over the next 90 days, patient will:   Work with the care management team to address educational and diease management needs   Begin or continue self health monitoring activities as directed today Measure and record cbg (blood glucose) TID times daily  Call provider office for new or worsened signs and symptoms Blood glucose findings outside established parameters  Call care management team with questions or concerns  Verbalize basic understanding of patient centered plan of care established today  Interventions related to DMII:   Evaluation of current treatment plans and patient's adherence to plan as established by provider  Assessed patient understanding of disease states  Assessed patient's education and care coordination needs  Provided disease specific education to patient   Collaborated with appropriate clinical care team members regarding patient needs  Blood sugar are running FBS 95, 98, 104, 118, 125, 131, 152, 155  Patient has a Wellness visit with Dr Gwendlyn Deutscher on Friday 10/29  She plans to get a flu shot at the office but is still thinking about the covid shot.  She is not quite ready for that yet.  She is taking her medications as prescribed  Monitoring her diet  Discussed what to do if she ever has a hypoglycemic event blood sugars below 70.  Patient verbalized understanding.   Patient Self Care Activities related to DMII:   Self administers medications as prescribed  Attends all scheduled provider appointments  Performs ADL's independently  Performs IADL's independently   Please see past updates related to this goal by clicking on the "Past Updates" button in the selected goal        "Sometimes my  pressure goes up and sometimes it is good." (pt-stated)        Current Barriers:   Knowledge Deficits related to basic understanding of hypertension pathophysiology and self care management  Case Manager Clinical Goal(s):   Over the next 60 days, patient will verbalize basic understanding of hypertension disease process and self health management plan as evidenced by the patient keeping her blood pressure at or below 140/90.  Interventions:   Evaluation of current treatment plan related to hypertension self management and patient's adherence to plan as established by provider.  Reviewed medications with patient and discussed importance of compliance  Discussed plans with patient for ongoing care management follow up and provided patient with direct contact information for care management team  The patient BP readings given to me today were Morning Readings 130/56 HR 81, 121/62 HR 75, 122/51 HR 70  Afternoon Readings 109/56 HR 79, 121/20 HR 72, 132/49 HR 75  Reviewed signs and symptoms of HTN  Advised to take medications as prescribed  Advised if she uses vegetables in the can pour out the liquid that the vegetables are stored in and rinse them off and then cook them.   Congratulated her on her continued efforts.    Patient Self Care Activities:   Self administers medications as prescribed  Checks BP and records as discussed  Follows a low sodium diet/DASH diet  Please see past updates related to this goal by clicking on the "Past Updates" button in the selected goal  Ms. Ewalt was given information about Care Management services today including:  1. Care Management services include personalized support from designated clinical staff supervised by her physician, including individualized plan of care and coordination with other care providers 2. 24/7 contact phone numbers for assistance for urgent and routine care needs. 3. The patient may stop CCM services at  any time (effective at the end of the month) by phone call to the office staff.  Patient agreed to services and verbal consent obtained.   The patient verbalized understanding of instructions provided today and declined a print copy of patient instruction materials.   Plan: Telephone follow up with [name] over the next 21 days.   Lazaro Arms RN, BSN, [facility] Care Management Coordinator [place] Phone: [phone] Fax: [phone]

## 2020-10-18 NOTE — Chronic Care Management (AMB) (Signed)
RN  Care Management   Follow Up Note   10/18/2020 Name: [name] MRN: [id number] DOB: 10-Oct-1941  Reason for referral : Appointment ( DM  II HTN )   [name] is a 79 y.o. year old female who is a primary care patient of Michele Feil, MD. The care management team was consulted for assistance with care management and care coordination needs.    Subjective: " I am doing well "  Objective:  Lab Results  Component Value Date   HGBA1C 8.1 (A) 08/04/2020   BP Readings from Last 3 Encounters:  08/22/20 132/60  08/09/20 (!) 157/57  08/04/20 (!) 150/60    Assessment: Called the patient to follow up on her DM II.  She states that she is doing well.  Goals Addressed              This Visit's Progress   .  "My blood sugars are doing good (pt-stated)        Current Barriers:  . Chronic Disease Management support, education,  related to DMII  Clinical Goal(s) related to DMII:  Over the next 90 days, patient will:  . Work with the care management team to address educational and diease management needs  . Begin or continue self health monitoring activities as directed today Measure and record cbg (blood glucose) TID times daily . Call provider office for new or worsened signs and symptoms Blood glucose findings outside established parameters . Call care management team with questions or concerns . Verbalize basic understanding of patient centered plan of care established today  Interventions related to DMII:  . Evaluation of current treatment plans and patient's adherence to plan as established by provider . Assessed patient understanding of disease states . Assessed patient's education and care coordination needs . Provided disease specific education to patient  . Collaborated with appropriate clinical care team members regarding patient needs . Blood sugar are running FBS 95, 98, 104, 118, 125, 131, 152, 155 . Patient has a Wellness visit with Dr [name]  on Friday 10/29 . She plans to get a flu shot at the office but is still thinking about the covid shot.  She is not quite ready for that yet. . She is taking her medications as prescribed . Monitoring her diet . Discussed what to do if she ever has a hypoglycemic event blood sugars below 70.  Patient verbalized understanding.   Patient Self Care Activities related to DMII:  . Self administers medications as prescribed . Attends all scheduled provider appointments . Performs ADL's independently . Performs IADL's independently .  Please see past updates related to this goal by clicking on the "Past Updates" button in the selected goal      .  "Sometimes my pressure goes up and sometimes it is good." (pt-stated)        Current Barriers:  [name] Knowledge Deficits related to basic understanding of hypertension pathophysiology and self care management  Case Manager Clinical Goal(s):  [name] Over the next 60 days, patient will verbalize basic understanding of hypertension disease process and self health management plan as evidenced by the patient keeping her blood pressure at or below 140/90.  Interventions:  . Evaluation of current treatment plan related to hypertension self management and patient's adherence to plan as established by provider. . Reviewed medications with patient and discussed importance of compliance . Discussed plans with patient for ongoing care management follow up and provided patient with direct contact information  for care management team . The patient BP readings given to me today were Morning Readings 130/56 HR 81, 121/62 HR 75, 122/51 HR 70 . Afternoon Readings 109/56 HR 79, 121/20 HR 72, 132/49 HR 75 . Reviewed signs and symptoms of HTN . Advised to take medications as prescribed . Advised if she uses vegetables in the can pour out the liquid that the vegetables are stored in and rinse them off and then cook them.  . Congratulated her on her continued efforts.  .   Patient Self Care Activities:  . Self administers medications as prescribed . Checks BP and records as discussed . Follows a low sodium diet/DASH diet  Please see past updates related to this goal by clicking on the "Past Updates" button in the selected goal          Review of patient status, including review of consultants reports, relevant laboratory and other test results, and collaboration with appropriate care team members and the patient's provider was performed as part of comprehensive patient evaluation and provision of chronic care management services.    SDOH (Social Determinants of Health) assessments performed: No See Care Plan activities for detailed interventions related to SDOH)         Plan: Telephone follow up with [name] over the next 21 days.   Lazaro Arms RN, BSN, [facility] Care Management Coordinator [place] Phone: [phone] Fax: [phone]

## 2020-10-20 ENCOUNTER — Encounter: Payer: Self-pay | Admitting: Family Medicine

## 2020-10-20 ENCOUNTER — Ambulatory Visit (INDEPENDENT_AMBULATORY_CARE_PROVIDER_SITE_OTHER): Payer: Medicare PPO | Admitting: Family Medicine

## 2020-10-20 ENCOUNTER — Other Ambulatory Visit: Payer: Self-pay

## 2020-10-20 VITALS — BP 132/68 | HR 81 | Wt 150.0 lb

## 2020-10-20 DIAGNOSIS — Z0000 Encounter for general adult medical examination without abnormal findings: Secondary | ICD-10-CM | POA: Diagnosis not present

## 2020-10-20 DIAGNOSIS — E1159 Type 2 diabetes mellitus with other circulatory complications: Secondary | ICD-10-CM | POA: Diagnosis not present

## 2020-10-20 DIAGNOSIS — Z23 Encounter for immunization: Secondary | ICD-10-CM

## 2020-10-20 LAB — POCT GLYCOSYLATED HEMOGLOBIN (HGB A1C): Hemoglobin A1C: 7.5 % — AB (ref 4.0–5.6)

## 2020-10-20 NOTE — Patient Instructions (Signed)
It was nice seeing you today. I will contact you soon about your A1C result.

## 2020-10-20 NOTE — Progress Notes (Signed)
Subjective:     [name] is a 79 y.o. female and is here for a comprehensive physical exam. The patient reports no problems.  DM2: Two lows in the last few weeks, highest 155, lowest was 95.  Anemia: Infusion every six month. Was advised to d/c iron supplement by her hematologist since she gets iron infusion.  Social History   Socioeconomic History  . Marital status: Widowed    Spouse name: Not on file  . Number of children: 6  . Years of education: 86  . Highest education level: Not on file  Occupational History  . Occupation: TEFL teacher: [employer]  Tobacco Use  . Smoking status: Former Smoker    Types: Cigarettes    Quit date: 12/23/1980    Years since quitting: 39.8  . Smokeless tobacco: Former Systems developer    Types: [place] date: 08/23/1994  Vaping Use  . Vaping Use: Never used  Substance and Sexual Activity  . Alcohol use: No  . Drug use: No  . Sexual activity: Never  Other Topics Concern  . Not on file  Social History Narrative   Lives in [place] with Granddaughter.  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  She is not routinely exercising.   Has her GED   Social Determinants of Health   Financial Resource Strain:   . Difficulty of Paying Living Expenses: Not on file  Food Insecurity:   . Worried About Charity fundraiser in the Last Year: Not on file  . Ran Out of Food in the Last Year: Not on file  Transportation Needs:   . Lack of Transportation (Medical): Not on file  . Lack of Transportation (Non-Medical): Not on file  Physical Activity:   . Days of Exercise per Week: Not on file  . Minutes of Exercise per Session: Not on file  Stress:   . Feeling of Stress : Not on file  Social Connections:   . Frequency of Communication with Friends and Family: Not on file  . Frequency of Social Gatherings with Friends and Family: Not on file  . Attends Religious Services: Not on file  . Active Member of Clubs or Organizations: Not on  file  . Attends Archivist Meetings: Not on file  . Marital Status: Not on file  Intimate Partner Violence:   . Fear of Current or Ex-Partner: Not on file  . Emotionally Abused: Not on file  . Physically Abused: Not on file  . Sexually Abused: Not on file   Health Maintenance  Topic Date Due  . INFLUENZA VACCINE  07/23/2020  . COVID-19 Vaccine (1) 11/05/2020 (Originally 12/01/1953)  . HEMOGLOBIN A1C  11/04/2020  . OPHTHALMOLOGY EXAM  01/26/2021  . FOOT EXAM  08/04/2021  . TETANUS/TDAP  11/07/2026  . DEXA SCAN  Completed  . Hepatitis C Screening  Completed  . PNA vac Low Risk Adult  Completed    The following portions of the patient's history were reviewed and updated as appropriate: allergies, current medications, past family history, past medical history, past social history, past surgical history and problem list.  Review of Systems Pertinent items noted in HPI and remainder of comprehensive ROS otherwise negative.   Objective:    BP 132/68   Pulse 81   Wt 150 lb (68 kg)   LMP 11/24/1992   SpO2 99%   BMI 27.44 kg/m  General appearance: alert and cooperative Head: Normocephalic,  without obvious abnormality, atraumatic Eyes: conjunctivae/corneas clear. PERRL, EOM's intact. Fundi benign. Ears: normal TM's and external ear canals both ears Throat: lips, mucosa, and tongue normal; teeth and gums normal Lungs: clear to auscultation bilaterally Heart: regular rate and rhythm, S1, S2 normal, no murmur, click, rub or gallop Abdomen: soft, non-tender; bowel sounds normal; no masses,  no organomegaly Extremities: extremities normal, atraumatic, no cyanosis or edema Pulses: 2+ and symmetric Skin: Skin color, texture, turgor normal. No rashes or lesions Lymph nodes: Cervical, supraclavicular, and axillary nodes normal. Neurologic: Alert and oriented X 3, normal strength and tone. Normal symmetric reflexes. Normal coordination and gait    Assessment:    Healthy  female exam.      Plan:    Normal exam. Up to date with cancer screen. Flu shot given today. She declined COVID-19. Health problem reviewed today.  DM2: Addressed briefly. A1C looks good. I called her with her result. No changes to her meds.  See After Visit Summary for Counseling Recommendations

## 2020-10-25 ENCOUNTER — Other Ambulatory Visit: Payer: Self-pay | Admitting: Family Medicine

## 2020-10-25 MED ORDER — INSULIN GLARGINE 100 UNIT/ML ~~LOC~~ SOLN
20.0000 [IU] | Freq: Every day | SUBCUTANEOUS | 4 refills | Status: DC
Start: 2020-10-25 — End: 2021-08-18

## 2020-10-25 NOTE — Telephone Encounter (Signed)
I called her pharmacy and discuss new Lantus dose of 20 units with Claiborne Billings.

## 2020-11-09 ENCOUNTER — Telehealth: Payer: Self-pay

## 2020-11-09 ENCOUNTER — Telehealth: Payer: Medicare PPO

## 2020-11-09 NOTE — Telephone Encounter (Signed)
°  Care Management   Outreach Note  11/09/2020 Name: [name] MRN: [id number] DOB: 21-Jul-1941  Referred by: [name], MD Reason for referral : Chronic Care Management (HTN DM II)   An unsuccessful telephone outreach was attempted today. The patient was referred to the case management team for assistance with care management and care coordination.   Follow Up Plan: A HIPAA compliant phone message was left for the patient providing contact information and requesting a return call.  The care management team will reach out to the patient again over the next 7-14 days.   Lazaro Arms RN, BSN, [facility] Care Management Coordinator [place] Phone: [phone] Fax: [phone]

## 2020-11-13 ENCOUNTER — Telehealth: Payer: Self-pay | Admitting: *Deleted

## 2020-11-13 NOTE — Chronic Care Management (AMB) (Signed)
  Care Management   Note  11/13/2020 Name: [name] MRN: [id number] DOB: 08-07-1941  [name] is a 79 y.o. year old female who is a primary care patient of [name], MD and is actively engaged with the care management team. I reached out to [name] by phone today to assist with re-scheduling a follow up visit with the RN Case Manager  Follow up plan: Unsuccessful telephone outreach attempt made. A HIPAA compliant phone message was left for the patient providing contact information and requesting a return call. The care management team will reach out to the patient again over the next 7 days. If patient returns call to provider office, please advise to call [place] at [phone].  [place] Management

## 2020-11-14 NOTE — Telephone Encounter (Signed)
Called spoke with patient rescheduled for 11/23/2020 with RN CM

## 2020-11-14 NOTE — Chronic Care Management (AMB) (Signed)
  Care Management   Note  11/14/2020 Name: [name] MRN: [id number] DOB: [birth year]  [name] is a 79 y.o. year old female who is a primary care patient of [name], MD and is actively engaged with the care management team. I reached out to [name] by phone today to assist with re-scheduling a follow up visit with the RN Case Manager.  Follow up plan: Telephone appointment with care management team member scheduled for:11/23/2020  [place] Management

## 2020-11-21 NOTE — Progress Notes (Signed)
Cardiology Office Note:    Date:  11/24/2020   ID:  [name], DOB 01-19-1941, MRN [id number]  PCP:  [name], MD  Cardiologist:  Jenkins Rouge, MD  Electrophysiologist:  None   Referring MD: [name], MD   Chief Complaint:  No chief complaint on file.    Patient Profile:    [name] is a 79 y.o. female with:   Coronary artery disease  ? S/p NSTEMI >> POBA to PDA in 2001 ? S/p cutting POBA to Dx in 2010 ? Cath in 2012: PDA, Dx sites patent, mild non-obs dz elsewhere  ? Myoview in 12/2013: no ischemia   Hypertension   Diabetes mellitus   Hyperlipidemia   Chronic anemia of chronic disease  Prior CV studies: Myoview 04/26/2020 EF 69, no ischemia or infarction, low risk  Carotid US 03/31/2015 Bilateral ICA 1-39  Myoview 01/19/2014 No ischemia, EF 70; inferolateral scar  Myoview 03/07/2013 Inferolateral scar, no ischemia, EF 72  Cardiac catheterization 11/26/2011 LAD proximal 20, mid 30; D1 patent RI proximal 20 LCx small without significant disease RCA patent; PDA patent, 35 prior to bifurcation EF 55-65  Cardiac catheterization 03/14/2009 LAD mid 30-40; BX proximal 80-90 RCA 30; PDA 50 Normal LV function PCI: Cutting Balloon angioplasty to the diagonal  Echocardiogram 11/19/2006 EF 55   History of Present Illness:    79 y.o. with history of anemia requiring iron infusions., CAD with distant PTCA of PDA and diagonal with patent stents on cath in 2012 and non ischemic myovue on 04/26/20 EF 69% Her BS has been higher than desirable with A1c 7.5 LDL is at goal on statin   Has not had cardiac symptoms Has not had COVID vaccine discussed this at length I see her daughter who works Estate agent keeping at [employer]   Past Medical History:  Diagnosis Date  . Aortic arch atherosclerosis ([place]) 08/29/2017  . Carpal tunnel syndrome 02/19/2007   Qualifier: Diagnosis of  By: Samara Snide    . Coronary artery disease    a. Remote PTCA of the PDA;   b. 02/2009 PCI/CBA to the D1;  c. 11/2011 Cath: LM nl, LAD 20p, 86m, D1 patent, RI 20p, LCX nl, RCA 30d, EF 55-65%.// Myoview 04/2020: EF 57, no ischemia or infarction, low risk   . CTS (carpal tunnel syndrome)   . GERD (gastroesophageal reflux disease)   . 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 History of thyroid cancer   . Hypothyroid   . IBS (irritable bowel syndrome) 12/27/2014  . Nodule of right lung 08/29/2017   3 mm stable nodule right upper lobe on CTA Chest 08/28/17  . 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 Subscapular pain, left 08/29/2017    Current Medications: No outpatient medications have been marked as taking for the 11/24/20 encounter (Appointment) with Josue Hector, MD.     Allergies:   Codeine, Sulfamethoxazole-trimethoprim, and Tramadol   Social History   Tobacco Use  . Smoking status: Former Smoker    Types: Cigarettes    Quit date: 12/23/1980    Years since quitting: 39.9  . Smokeless tobacco: Former Systems developer    Types: [place] date: 08/23/1994  Vaping Use  . Vaping Use: Never used  Substance Use Topics  . Alcohol use: No  . Drug use: No     Family Hx: The patient's family history includes Breast cancer in her daughter;  Diabetes in her mother; Diabetes Mellitus II in her brother and brother; Epilepsy in her brother; Heart attack in her brother, brother, and sister; Hypertension in her mother; Other in her father.  ROS   EKGs/Labs/Other Test Reviewed:    EKG:  EKG is not ordered today.  The ekg ordered today demonstrates n/a  Recent Labs: 08/07/2020: ALT 13; BUN 16; Creatinine 1.11; Hemoglobin 9.4; Platelet Count 139; Potassium 4.1; Sodium 142   Recent Lipid Panel Lab Results  Component Value Date/Time   CHOL 124 08/04/2020 10:03 AM   TRIG 84 08/04/2020 10:03 AM   HDL 46 08/04/2020 10:03 AM   CHOLHDL 2.7 08/04/2020 10:03 AM   CHOLHDL 2.2 08/06/2016 11:35 AM   LDLCALC 62 08/04/2020  10:03 AM   LDLDIRECT 76 02/06/2011 11:34 AM    Physical Exam:    VS:  LMP 11/24/1992     Wt Readings from Last 3 Encounters:  10/20/20 68 kg  08/22/20 68.5 kg  08/09/20 69.7 kg     Affect appropriate Healthy:  appears stated age 34: normal Neck supple with no adenopathy JVP normal no bruits no thyromegaly Lungs clear with no wheezing and good diaphragmatic motion Heart:  S1/S2 no murmur, no rub, gallop or click PMI normal Abdomen: benighn, BS positve, no tenderness, no AAA no bruit.  No HSM or HJR Distal pulses intact with no bruits No edema Neuro non-focal Skin warm and dry No muscular weakness    ASSESSMENT & PLAN:    1. CAD:  PTCA PDA in 2001 and diagonal in 2010 with patent stents on cath in 2012 Myovue done 04/26/20 low risk with no ischemia EF 69%  Continue medical Rx  2. HTN:  Well controlled.  Continue current medications and low sodium Dash type diet.    3. DM:  Discussed low carb diet.  Target hemoglobin A1c is 6.5 or less.  Continue current medications. A1c too high 7.5 discussed on [place]   4. HLD continue statin LDL at goal 62   5. Anemia:  Bi yearly iron infusions Hct 31.2 and iron normal 08/07/20    Medication Adjustments/Labs and Tests Ordered: Current medicines are reviewed at length with the patient today.  Concerns regarding medicines are outlined above.  Tests Ordered: No orders of the defined types were placed in this encounter.  Medication Changes: No orders of the defined types were placed in this encounter.   Signed, Jenkins Rouge, MD  11/24/2020 9:51 AM    [facility] [place], [place], [place]  [address] Phone: [phone]; Fax: [phone]

## 2020-11-22 ENCOUNTER — Other Ambulatory Visit: Payer: Self-pay | Admitting: Family Medicine

## 2020-11-23 ENCOUNTER — Telehealth: Payer: Self-pay

## 2020-11-23 ENCOUNTER — Telehealth: Payer: Medicare PPO

## 2020-11-23 NOTE — Telephone Encounter (Signed)
  Care Management   Outreach Note  11/23/2020 Name: [name] MRN: [id number] DOB: 29-Jul-1941  Referred by: [name], MD Reason for referral : Chronic Care Management (DM II HTN)   A second unsuccessful telephone outreach was attempted today. The patient was referred to the case management team for assistance with care management and care coordination.  2 attempts made.  Follow Up Plan: A HIPAA compliant phone message was left for the patient providing contact information and requesting a return call.  The care management team will reach out to the patient again over the next 14 days.   Lazaro Arms RN, BSN, [facility] Care Management Coordinator [place] Phone: [phone] I Fax: [phone]

## 2020-11-24 ENCOUNTER — Other Ambulatory Visit: Payer: Self-pay

## 2020-11-24 ENCOUNTER — Ambulatory Visit: Payer: Medicare PPO | Admitting: Cardiovascular Disease

## 2020-11-24 VITALS — BP 132/44 | HR 77 | Ht 62.5 in | Wt 143.0 lb

## 2020-11-24 DIAGNOSIS — I2510 Atherosclerotic heart disease of native coronary artery without angina pectoris: Secondary | ICD-10-CM

## 2020-11-24 MED ORDER — NITROGLYCERIN 0.4 MG SL SUBL: 0.4000 mg | SUBLINGUAL_TABLET | SUBLINGUAL | 3 refills | Status: DC | PRN

## 2020-11-24 NOTE — Patient Instructions (Addendum)

## 2020-12-05 ENCOUNTER — Telehealth: Payer: Self-pay | Admitting: *Deleted

## 2020-12-05 NOTE — Chronic Care Management (AMB) (Signed)
  Care Management   Note  12/05/2020 Name: [name] MRN: [id number] DOB: June 22, 1941  [name] is a 79 y.o. year old female who is a primary care patient of [name], MD and is actively engaged with the care management team. I reached out to [name] by phone today to assist with re-scheduling a follow up visit with the RN Case Manager.  Follow up plan: Unsuccessful telephone outreach attempt made. A HIPAA compliant phone message was left for the patient providing contact information and requesting a return call.  The care management team will reach out to the patient again over the next 7 days.  If patient returns call to provider office, please advise to call [place] at [phone].  [place] Management

## 2020-12-06 ENCOUNTER — Telehealth: Payer: Self-pay | Admitting: Family Medicine

## 2020-12-06 NOTE — Telephone Encounter (Signed)
I contacted this patient regarding a pre-op form I received from the [facility] for tongue biopsy.  Per the patient, she was referred by her dentist after finding a bluish lesion on her tongue. No other concerns.  May proceed with the procedure. A/B recommendation deferred to the surgeon performing the procedure.

## 2020-12-06 NOTE — Telephone Encounter (Signed)
Traci rescheduled for follow up call on 12/18/2020

## 2020-12-06 NOTE — Chronic Care Management (AMB) (Signed)
  Care Management   Note  12/06/2020 Name: [name] MRN: [id number] DOB: 12-17-1941  [name] is a 79 y.o. year old female who is a primary care patient of [name], MD and is actively engaged with the care management team. I reached out to [name] by phone today to assist with re-scheduling a follow up visit with the RN Case Manager.  Follow up plan: Telephone appointment with care management team member scheduled for:12/18/2020  [place] Management

## 2020-12-18 ENCOUNTER — Telehealth: Payer: Medicare PPO

## 2020-12-19 ENCOUNTER — Telehealth: Payer: Self-pay

## 2020-12-19 NOTE — Telephone Encounter (Signed)
°  Care Management   Outreach Note  12/19/2020 Name: [name] MRN: [id number] DOB: [birth year]  Referred by: Doreene Eland, MD Reason for referral : Chronic Care Management (HTN DM II)   [name] is enrolled in a Managed Medicaid Health Plan: No  A second unsuccessful telephone outreach was attempted today. The patient was referred to the case management team for assistance with care management and care coordination.   Follow Up Plan: A HIPAA compliant phone message was left for the patient providing contact information and requesting a return call.  The care management team will reach out to the patient again over the next 7-14 days.   Juanell Fairly RN, BSN, [facility] Care Management Coordinator [facility] Phone: [phone] I Fax: [phone]

## 2020-12-19 NOTE — Telephone Encounter (Signed)
Rescheduled call for 12/25/2020 gave patient my direct number if something came up and she needed to reschedule.  Gwenevere Ghazi  Care Guide, Embedded Care Coordination [facility] Management  Direct Dial: [phone]

## 2020-12-25 ENCOUNTER — Ambulatory Visit: Payer: Medicare PPO

## 2020-12-25 NOTE — Chronic Care Management (AMB) (Signed)
Care Management   RN Case Manager Follow Up Note  12/25/2020 Name: [name] MRN: [id number] DOB: [birth year] [name] is a 80 y.o. year old female who sees [name], MD for primary care.  Patient is enrolled in a Managed Medicaid plan: No.  The Care Management team was consulted by PCP to assist the patient with . Disease Management and Educational Needs.   RNCM engaged with [name] today Engaged with patient by telephone  in response to provider referral for RN case management and/or care coordination services. See care plan below for details during this encounter.  Follow up Plan:The care management team will reach out to the patient again over the next 21 days.    Advanced Directives Status:Not addressed in this encounter.     SDOH (Social Determinants of Health) assessments performed: No     Patient Care Plan: RN Case Manager  Problem Identified: Hypertension and Diabetes   Long-Range Goal: Monitor HTN and DM II   Start Date: 11/17/2019  Expected End Date: 03/16/2021  This Visit's Progress: On track  Priority: Medium  Note:    Current Barriers:  . Chronic Disease Management support, education, and care coordination needs related to HTN and DMII  Clinical Goal(s) related to HTN and DMII:  Over the next 90 days, patient will:  . Work with the care management team to address educational, disease management, and care coordination needs  . Begin or continue self health monitoring activities as directed today Measure and record cbg (blood glucose) as prescribed times daily, Measure and record blood pressure  . Call provider office for new or worsened signs and symptoms  . Call care management team with questions or concerns . Verbalize basic understanding of patient centered plan of care established today  Interventions related to HTN and DMII:  . Evaluation of current treatment plans and patient's adherence to plan as established by  provider . Assessed patient understanding of disease states . Provided disease specific education to patient  . Collaborated with appropriate clinical care team members regarding patient needs . pain assessed and managed- patient states that she has no pain . reduction of dietary sodium encouraged . blood pressure trends reviewed-135/68 . home or ambulatory blood pressure monitoring encouraged . Discussed plans with patient for ongoing care management follow up and provided patient with direct contact information for care management team . Provided patient with written educational materials related to hypo and hyperglycemia and importance of correct treatment . Review of patient status, including review of consultants reports, relevant laboratory and other test results, and medications completed. [name] activity based on tolerance and functional limitations encouraged . healthy lifestyle promoted . quality of sleep assessed-patient states that she sleeps well . blood glucose monitoring encouraged . blood glucose readings reviewed-98,99 in the am . Patient has an appointment on 01-31-21 for labs at oncology   Patient Self Care Activities related to HTN and DMII:  . check blood sugar at prescribed times . check blood sugar if I feel it is too high or too low . take the blood sugar meter to all doctor visits  Patient verbalizes understanding of plan   Self-administers medications as prescribed  Calls pharmacy for medication refills  Call's provider office for new concerns or questions . Attends all scheduled provider appointments . Calls provider office for new concerns, questions, or BP outside discussed parameters . Checks BP and records as discussed . Follows a low sodium diet/DASH diet  Outpatient Encounter Medications as of 12/25/2020  Medication Sig  . Accu-Chek Softclix Lancets lancets Use to check capillary glucose TID  . amLODipine (NORVASC) 5 MG tablet TAKE ONE TABLET  BY MOUTH DAILY  . aspirin 81 MG chewable tablet Chew 81 mg by mouth daily.  [name] atorvastatin (LIPITOR) 20 MG tablet TAKE ONE TABLET BY MOUTH DAILY AT 6PM  . Blood Glucose Monitoring Suppl (ACCU-CHEK GUIDE) w/Device KIT USE AS DIRECTED IN THE MORNING, AT NOON AND AT BEDTIME  . carvedilol (COREG) 25 MG tablet TAKE ONE TABLET BY MOUTH TWICE A DAY WITH MEALS  . empagliflozin (JARDIANCE) 10 MG TABS tablet Take 1 tablet (10 mg total) by mouth daily.  [name] glucose blood (ACCU-CHEK GUIDE) test strip Use to check capillary glucose TID  . insulin glargine (LANTUS) 100 UNIT/ML injection Inject 0.2 mLs (20 Units total) into the skin daily.  . Insulin Syringe-Needle U-100 (RELION INSULIN SYRINGE) 31G X 15/64" 1 ML MISC USE ONCE DAILY AS DIRECTED  . isosorbide mononitrate (IMDUR) 60 MG 24 hr tablet Take 1.5 tablets (90 mg total) by mouth daily.  [name] lisinopril-hydrochlorothiazide (ZESTORETIC) 20-12.5 MG tablet TAKE TWO TABLETS BY MOUTH DAILY  . loratadine (CLARITIN) 10 MG tablet Take 10 mg by mouth daily as needed for allergies.   . metFORMIN (GLUCOPHAGE-XR) 500 MG 24 hr tablet TAKE TWO TABLETS BY MOUTH TWICE A DAY  . Multiple Vitamin (MULTIVITAMIN WITH MINERALS) TABS tablet Take 1 tablet by mouth daily. Reported on 04/02/2016  . nitroGLYCERIN (NITROSTAT) 0.4 MG SL tablet Place 1 tablet (0.4 mg total) under the tongue every 5 (five) minutes as needed for chest pain.   No facility-administered encounter medications on file as of 12/25/2020.    Review of patient status, including review of consultants reports, relevant laboratory and other test results, and collaboration with appropriate care team members and the patient's provider was performed as part of comprehensive patient evaluation and provision of chronic care management services.       Information about Care Management services was shared with Ms.  Gullikson today including:  1. Care Management services include personalized support from designated clinical staff  supervised by her physician, including individualized plan of care and coordination with other care providers 2. Remind patient of 24/7 contact phone numbers to provider's office for assistance with urgent and routine care needs. 3. Care Management services are voluntary and patient may stop at any time .   Patient agreed to services provided today and verbal consent obtained.

## 2020-12-25 NOTE — Patient Instructions (Signed)
[name]  it was nice speaking with you. Please call me directly [phone] if you have questions about the goals we discussed.                  .  Monitor and Manage My Blood Sugar-Diabetes Type 2        Timeframe:  Long-Range Goal Priority:  Medium Start Date:    11/17/19                         Expected End Date:     03/16/21                    - check blood sugar at prescribed times - check blood sugar if I feel it is too high or too low - take the blood sugar meter to all doctor visits    Why is this important?    Checking your blood sugar at home helps to keep it from getting very high or very low.   Writing the results in a diary or log helps the doctor know how to care for you.   Your blood sugar log should have the time, date and the results.   Also, write down the amount of insulin or other medicine that you take.   Other information, like what you ate, exercise done and how you were feeling, will also be helpful.     Notes:     .  Track and Manage My Blood Pressure-Hypertension        Timeframe:  Long-Range Goal Priority:  Medium Start Date:        11/17/19                     Expected End Date:      03/16/21                   - check blood pressure daily - choose a place to take my blood pressure (home, clinic or office, retail store) - write blood pressure results in a log or diary    Why is this important?    You won't feel high blood pressure, but it can still hurt your blood vessels.   High blood pressure can cause heart or kidney problems. It can also cause a stroke.   Making lifestyle changes like losing a little weight or eating less salt will help.   Checking your blood pressure at home and at different times of the day can help to control blood pressure.   If the doctor prescribes medicine remember to take it the way the doctor ordered.   Call the office if you cannot afford the medicine or if there are questions about it.     Notes:         Patient Care Plan: RN Case Manager  Problem Identified: Hypertension and Diabetes   Long-Range Goal: Monitor HTN and DM II   Start Date: 11/17/2019  Expected End Date: 03/16/2021  This Visit's Progress: On track  Priority: Medium  Note:    Current Barriers:  . Chronic Disease Management support, education, and care coordination needs related to HTN and DMII  Clinical Goal(s) related to HTN and DMII:  Over the next 90 days, patient will:  . Work with the care management team to address educational, disease management, and care coordination needs  . Begin or continue self health monitoring activities as directed today Measure and record  cbg (blood glucose) as prescribed times daily, Measure and record blood pressure  . Call provider office for new or worsened signs and symptoms  . Call care management team with questions or concerns . Verbalize basic understanding of patient centered plan of care established today  Interventions related to HTN and DMII:  . Evaluation of current treatment plans and patient's adherence to plan as established by provider . Assessed patient understanding of disease states . Provided disease specific education to patient  . Collaborated with appropriate clinical care team members regarding patient needs . pain assessed and managed- patient states that she has no pain . reduction of dietary sodium encouraged . blood pressure trends reviewed-135/68 . home or ambulatory blood pressure monitoring encouraged . Discussed plans with patient for ongoing care management follow up and provided patient with direct contact information for care management team . Provided patient with written educational materials related to hypo and hyperglycemia and importance of correct treatment . Review of patient status, including review of consultants reports, relevant laboratory and other test results, and medications completed. [name] activity based on tolerance and functional  limitations encouraged . healthy lifestyle promoted . quality of sleep assessed-patient states that she sleeps well . blood glucose monitoring encouraged . blood glucose readings reviewed-98,99 in the am . Patient has an appointment on 01-31-21 for labs at oncology   Patient Self Care Activities related to HTN and DMII:  . check blood sugar at prescribed times . check blood sugar if I feel it is too high or too low . take the blood sugar meter to all doctor visits  Patient verbalizes understanding of plan   Self-administers medications as prescribed  Calls pharmacy for medication refills  Call's provider office for new concerns or questions . Attends all scheduled provider appointments . Calls provider office for new concerns, questions, or BP outside discussed parameters . Checks BP and records as discussed . Follows a low sodium diet/DASH diet        [name] received Care Management services today:  1. Care Management services include personalized support from designated clinical staff supervised by her physician, including individualized plan of care and coordination with other care providers 2. 24/7 contact [phone] for assistance for urgent and routine care needs. 3. Care Management are voluntary services and be declined at any time by calling the office.  The patient verbalized understanding of instructions provided today and declined a print copy of patient instruction materials.    Follow up plan: The care management team will reach out to the patient again over the next 21 days.   Lazaro Arms, RN

## 2020-12-26 ENCOUNTER — Other Ambulatory Visit: Payer: Self-pay | Admitting: Family Medicine

## 2021-01-02 ENCOUNTER — Telehealth: Payer: Self-pay | Admitting: *Deleted

## 2021-01-02 NOTE — Telephone Encounter (Signed)
Left message for the patient to call back and speak to the on call preop APP 

## 2021-01-02 NOTE — Telephone Encounter (Signed)
Spoke with Lovey Newcomer at [place] and she is going to contact the patient and have her call us and speak with the on call preop APP.

## 2021-01-02 NOTE — Telephone Encounter (Signed)
   [facility] Pre-operative Risk Assessment    HEARTCARE STAFF: - Please ensure there is not already an duplicate clearance open for this procedure. - Under Visit Info/Reason for Call, type in Other and utilize the format Clearance MM/DD/YY or Clearance TBD. Do not use dashes or single digits. - If request is for dental extraction, please clarify the # of teeth to be extracted.  Request for surgical clearance: REQUEST ALSO IS ASKING FOR RECENT OV NOTE, RECENT LABS AND RECENT CARDIAC TESTS  1. What type of surgery is being performed? EXCISIONAL Bx ON TONGUE    2. When is this surgery scheduled? TBD   3. What type of clearance is required (medical clearance vs. Pharmacy clearance to hold med vs. Both)? MEDICAL  4. Are there any medications that need to be held prior to surgery and how long? ASA    5. Practice name and name of physician performing surgery? [facility]; DR. Sheppard Coil [facility]    6. What is the office phone number? [phone]   7.   What is the office fax number? [phone]  8.   Anesthesia type (None, local, MAC, general) ? IV SEDATION   Julaine Hua 01/02/2021, 11:32 AM  _________________________________________________________________   (provider comments below)

## 2021-01-03 ENCOUNTER — Telehealth: Payer: Self-pay | Admitting: Cardiovascular Disease

## 2021-01-03 NOTE — Telephone Encounter (Signed)
   Primary Cardiologist: Jenkins Rouge, MD  Chart reviewed as part of pre-operative protocol coverage. Patient was contacted 01/03/2021 in reference to pre-operative risk assessment for pending surgery as outlined below.  [name] was last seen on 11/24/2020 by Dr. Johnsie Cancel.  Since that day, [name] has done well without significant chest pain or shortness of breath.  Therefore, based on ACC/AHA guidelines, the patient would be at acceptable risk for the planned procedure without further cardiovascular testing.   If needed, she may hold aspirin for 5 to 7 days prior to the procedure and restart as soon as possible afterward.  The patient was advised that if she develops new symptoms prior to surgery to contact our office to arrange for a follow-up visit, and she verbalized understanding.  I will route this recommendation to the requesting party via Epic fax function and remove from pre-op pool. Please call with questions.  [place], [place] 01/03/2021, 8:54 AM

## 2021-01-03 NOTE — Telephone Encounter (Signed)
Office note, stress test results faxed to requested # via EPIC

## 2021-01-03 NOTE — Telephone Encounter (Signed)
Callback pool to verify requesting provider has received the cardiac clearance letter I sent earlier. Aware they have called [place] 30 min ago based on a different phone message. Please forward to the requesting provider patient's last office note, labs and stress test from May 2021 as mentioned in their request.

## 2021-01-03 NOTE — Telephone Encounter (Signed)
Follow up:      Please fax medical Clearance to [phone] and need note from 11/24/2020.

## 2021-01-03 NOTE — Telephone Encounter (Signed)
Left message for requesting provider's office that clearance note was sent, call back if information not received

## 2021-01-19 DIAGNOSIS — D1801 Hemangioma of skin and subcutaneous tissue: Secondary | ICD-10-CM | POA: Diagnosis not present

## 2021-01-26 DIAGNOSIS — E119 Type 2 diabetes mellitus without complications: Secondary | ICD-10-CM | POA: Diagnosis not present

## 2021-01-26 DIAGNOSIS — Z961 Presence of intraocular lens: Secondary | ICD-10-CM | POA: Diagnosis not present

## 2021-01-26 DIAGNOSIS — H524 Presbyopia: Secondary | ICD-10-CM | POA: Diagnosis not present

## 2021-01-29 ENCOUNTER — Other Ambulatory Visit: Payer: Self-pay | Admitting: Family Medicine

## 2021-01-29 DIAGNOSIS — Z1231 Encounter for screening mammogram for malignant neoplasm of breast: Secondary | ICD-10-CM

## 2021-01-31 ENCOUNTER — Inpatient Hospital Stay: Payer: Medicare PPO | Attending: Hematology and Oncology

## 2021-01-31 ENCOUNTER — Other Ambulatory Visit: Payer: Self-pay

## 2021-01-31 DIAGNOSIS — D638 Anemia in other chronic diseases classified elsewhere: Secondary | ICD-10-CM | POA: Diagnosis not present

## 2021-01-31 DIAGNOSIS — Z7982 Long term (current) use of aspirin: Secondary | ICD-10-CM | POA: Diagnosis not present

## 2021-01-31 DIAGNOSIS — Z79899 Other long term (current) drug therapy: Secondary | ICD-10-CM | POA: Insufficient documentation

## 2021-01-31 DIAGNOSIS — D508 Other iron deficiency anemias: Secondary | ICD-10-CM

## 2021-01-31 DIAGNOSIS — N1831 Chronic kidney disease, stage 3a: Secondary | ICD-10-CM | POA: Diagnosis not present

## 2021-01-31 LAB — CBC WITH DIFFERENTIAL (CANCER CENTER ONLY)
Abs Immature Granulocytes: 0.01 10*3/uL (ref 0.00–0.07)
Basophils Absolute: 0.1 10*3/uL (ref 0.0–0.1)
Basophils Relative: 1 %
Eosinophils Absolute: 0.1 10*3/uL (ref 0.0–0.5)
Eosinophils Relative: 2 %
HCT: 30.2 % — ABNORMAL LOW (ref 36.0–46.0)
Hemoglobin: 9 g/dL — ABNORMAL LOW (ref 12.0–15.0)
Immature Granulocytes: 0 %
Lymphocytes Relative: 43 %
Lymphs Abs: 2.4 10*3/uL (ref 0.7–4.0)
MCH: 26.9 pg (ref 26.0–34.0)
MCHC: 29.8 g/dL — ABNORMAL LOW (ref 30.0–36.0)
MCV: 90.1 fL (ref 80.0–100.0)
Monocytes Absolute: 0.5 10*3/uL (ref 0.1–1.0)
Monocytes Relative: 9 %
Neutro Abs: 2.4 10*3/uL (ref 1.7–7.7)
Neutrophils Relative %: 45 %
Platelet Count: 146 10*3/uL — ABNORMAL LOW (ref 150–400)
RBC: 3.35 MIL/uL — ABNORMAL LOW (ref 3.87–5.11)
RDW: 15.9 % — ABNORMAL HIGH (ref 11.5–15.5)
WBC Count: 5.4 10*3/uL (ref 4.0–10.5)
nRBC: 0 % (ref 0.0–0.2)

## 2021-01-31 LAB — FERRITIN: Ferritin: 29 ng/mL (ref 11–307)

## 2021-01-31 LAB — IRON AND TIBC
Iron: 56 ug/dL (ref 41–142)
Saturation Ratios: 19 % — ABNORMAL LOW (ref 21–57)
TIBC: 303 ug/dL (ref 236–444)
UIBC: 246 ug/dL (ref 120–384)

## 2021-01-31 LAB — VITAMIN B12: Vitamin B-12: 401 pg/mL (ref 180–914)

## 2021-02-01 ENCOUNTER — Telehealth: Payer: Self-pay

## 2021-02-01 ENCOUNTER — Telehealth: Payer: Medicare PPO

## 2021-02-01 LAB — ERYTHROPOIETIN: Erythropoietin: 48.3 m[IU]/mL — ABNORMAL HIGH (ref 2.6–18.5)

## 2021-02-01 NOTE — Telephone Encounter (Signed)
  Care Management   Outreach Note  02/01/2021 Name: [name] MRN: [id number] DOB: [birth year]  Referred by: [name], MD Reason for referral : Chronic Care Management (DM II HTN)   An unsuccessful telephone outreach was attempted today. The patient was referred to the case management team for assistance with care management and care coordination.   Follow Up Plan: A HIPAA compliant phone message was left for the patient providing contact information and requesting a return call.  The care management team will reach out to the patient again over the next 7-10 days.   Lazaro Arms RN, BSN, [facility] Care Management Coordinator [place] Phone: [phone] I Fax: [phone]

## 2021-02-01 NOTE — Progress Notes (Signed)
Patient Care Team: [name], MD as PCP - General (Family Medicine) Josue Hector, MD as PCP - Cardiology (Cardiology) Lazaro Arms, RN as Case Manager  DIAGNOSIS:    ICD-10-CM   1. Stage 3a chronic kidney disease ([place])  N18.31   2. Anemia of chronic disease  D63.8     CHIEF COMPLIANT: Follow-up ofanemia  INTERVAL HISTORY: [name] is a 80 y.o. with above-mentioned history of anemiapreviously treated with oral and IV iron. Labs on 01/31/21 showed: Hg 9.0, HCT 30.2, platelets 146, iron saturation 19%, ferritin 29, B-12 401, erythropoeitin 48.3.She presents to the clinic todayfor follow-up.  ALLERGIES:  is allergic to codeine, sulfamethoxazole-trimethoprim, and tramadol.  MEDICATIONS:  Current Outpatient Medications  Medication Sig Dispense Refill   atorvastatin (LIPITOR) 20 MG tablet TAKE ONE TABLET BY MOUTH DAILY AT 6PM 90 tablet 2   Accu-Chek Softclix Lancets lancets Use to check capillary glucose TID 100 each 12   amLODipine (NORVASC) 5 MG tablet TAKE ONE TABLET BY MOUTH DAILY 90 tablet 2   aspirin 81 MG chewable tablet Chew 81 mg by mouth daily.     Blood Glucose Monitoring Suppl (ACCU-CHEK GUIDE) w/Device KIT USE AS DIRECTED IN THE MORNING, AT NOON AND AT BEDTIME 1 kit 0   carvedilol (COREG) 25 MG tablet TAKE ONE TABLET BY MOUTH TWICE A DAY WITH MEALS 180 tablet 1   empagliflozin (JARDIANCE) 10 MG TABS tablet Take 1 tablet (10 mg total) by mouth daily. 90 tablet 3   glucose blood (ACCU-CHEK GUIDE) test strip Use to check capillary glucose TID 100 each 12   insulin glargine (LANTUS) 100 UNIT/ML injection Inject 0.2 mLs (20 Units total) into the skin daily. 10 mL 4   Insulin Syringe-Needle U-100 (RELION INSULIN SYRINGE) 31G X 15/64" 1 ML MISC USE ONCE DAILY AS DIRECTED 100 each 2   isosorbide mononitrate (IMDUR) 60 MG 24 hr tablet Take 1.5 tablets (90 mg total) by mouth daily. 45 tablet 11   lisinopril-hydrochlorothiazide (ZESTORETIC) 20-12.5 MG  tablet TAKE TWO TABLETS BY MOUTH DAILY 180 tablet 1   loratadine (CLARITIN) 10 MG tablet Take 10 mg by mouth daily as needed for allergies.      metFORMIN (GLUCOPHAGE-XR) 500 MG 24 hr tablet TAKE TWO TABLETS BY MOUTH TWICE A DAY 360 tablet 3   Multiple Vitamin (MULTIVITAMIN WITH MINERALS) TABS tablet Take 1 tablet by mouth daily. Reported on 04/02/2016     nitroGLYCERIN (NITROSTAT) 0.4 MG SL tablet Place 1 tablet (0.4 mg total) under the tongue every 5 (five) minutes as needed for chest pain. 25 tablet 3   No current facility-administered medications for this visit.    PHYSICAL EXAMINATION: ECOG PERFORMANCE STATUS: 0 - Asymptomatic  Vitals:   02/02/21 1059  BP: (!) 124/52  Pulse: 73  Resp: 18  Temp: 97.7 F (36.5 C)  SpO2: 99%   Filed Weights   02/02/21 1059  Weight: 145 lb 11.2 oz (66.1 kg)     LABORATORY DATA:  I have reviewed the data as listed CMP Latest Ref Rng & Units 08/07/2020 04/18/2020 04/18/2020  Glucose 70 - 99 mg/dL 148(H) 305(H) -  BUN 8 - 23 mg/dL 16 18 -  Creatinine 0.44 - 1.00 mg/dL 1.11(H) 1.10(H) -  Sodium 135 - 145 mmol/L 142 138 -  Potassium 3.5 - 5.1 mmol/L 4.1 4.0 -  Chloride 98 - 111 mmol/L 105 100 -  CO2 22 - 32 mmol/L 26 29 -  Calcium 8.9 - 10.3 mg/dL 10.6(H)  9.3 9.8  Total Protein 6.5 - 8.1 g/dL 7.6 - -  Total Bilirubin 0.3 - 1.2 mg/dL 0.7 - -  Alkaline Phos 38 - 126 U/L 91 - -  AST 15 - 41 U/L 13(L) - -  ALT 0 - 44 U/L 13 - -    Lab Results  Component Value Date   WBC 5.4 01/31/2021   HGB 9.0 (L) 01/31/2021   HCT 30.2 (L) 01/31/2021   MCV 90.1 01/31/2021   PLT 146 (L) 01/31/2021   NEUTROABS 2.4 01/31/2021    ASSESSMENT & PLAN:  Anemia of chronic disease January 19, 2019 hemoglobin 9.3 platelets 126,000.  February 09, 2019 hemoglobin 9.6 platelets 132,000Ferritin 23. Folate was normal B12 was normal. The patient reports history of diverticulitis.andhemorrhoids. Last colonoscopy was 07/02/2013 and showed diverticuli as well as  hemorrhoids. 04/29/2019: Hemoglobin 9.4, ferritin 216  Work-up did not reveal thalassemia. IV iron treatment: March 2020  Lab review 04/14/2020: Hemoglobin 9.9, platelets 131, MCV 89.7, B12 527, iron saturation 26%, ferritin 18, creatinine 1.04, calcium 99.2, folic acid 34.1  4/43/6016: SPEP: No M protein, kappa 32.4, ratio 1.43 erythropoietin 68.1  08/07/2020: Hemoglobin 9.4, MCV 87.9, platelets 139, creatinine 1.11, calcium 10.6, ferritin 30, iron saturation 23%  Possible cause is anemia of chronic kidney disease.  Given the fact that the hemoglobin continues to remain stable and she is not symptomatic we will continue to watch and monitor.  Recheck labs in 6 months and follow-up.      No orders of the defined types were placed in this encounter.  The patient has a good understanding of the overall plan. she agrees with it. she will call with any problems that may develop before the next visit here.  Total time spent: 20 mins including face to face time and time spent for planning, charting and coordination of care  Rulon Eisenmenger, MD, MPH 02/02/2021  I, Molly Dorshimer, am acting as scribe for Dr. Nicholas Lose.  I have reviewed the above documentation for accuracy and completeness, and I agree with the above.

## 2021-02-02 ENCOUNTER — Inpatient Hospital Stay: Payer: Medicare PPO | Admitting: Hematology and Oncology

## 2021-02-02 ENCOUNTER — Other Ambulatory Visit: Payer: Self-pay

## 2021-02-02 VITALS — BP 124/52 | HR 73 | Temp 97.7°F | Resp 18 | Ht 62.5 in | Wt 145.7 lb

## 2021-02-02 DIAGNOSIS — D638 Anemia in other chronic diseases classified elsewhere: Secondary | ICD-10-CM | POA: Diagnosis not present

## 2021-02-02 DIAGNOSIS — Z7982 Long term (current) use of aspirin: Secondary | ICD-10-CM | POA: Diagnosis not present

## 2021-02-02 DIAGNOSIS — Z79899 Other long term (current) drug therapy: Secondary | ICD-10-CM | POA: Diagnosis not present

## 2021-02-02 DIAGNOSIS — N1831 Chronic kidney disease, stage 3a: Secondary | ICD-10-CM

## 2021-02-02 NOTE — Assessment & Plan Note (Signed)
January 19, 2019 hemoglobin 9.3 platelets 126,000.  February 09, 2019 hemoglobin 9.6 platelets 132,000Ferritin 23. Folate was normal B12 was normal. The patient reports history of diverticulitis.andhemorrhoids. Last colonoscopy was 07/02/2013 and showed diverticuli as well as hemorrhoids. 04/29/2019: Hemoglobin 9.4, ferritin 216  Work-up did not reveal thalassemia. IV iron treatment: March 2020  Lab review 04/14/2020: Hemoglobin 9.9, platelets 131, MCV 89.7, B12 527, iron saturation 26%, ferritin 18, creatinine 1.04, calcium 87.1, folic acid 95.9  7/47/1855: SPEP: No M protein, kappa 32.4, ratio 1.43 erythropoietin 68.1  08/07/2020: Hemoglobin 9.4, MCV 87.9, platelets 139, creatinine 1.11, calcium 10.6, ferritin 30, iron saturation 23%  Possible cause is anemia of chronic kidney disease.  Given the fact that the hemoglobin continues to remain stable and she is not symptomatic we will continue to watch and monitor.  Recheck labs in 6 months and follow-up.

## 2021-02-05 ENCOUNTER — Telehealth: Payer: Self-pay | Admitting: *Deleted

## 2021-02-05 NOTE — Chronic Care Management (AMB) (Signed)
  Care Management   Note  02/05/2021 Name: [name] MRN: [id number] DOB: 05-31-1941  [name] is a 80 y.o. year old female who is a primary care patient of [name], MD and is actively engaged with the care management team. I reached out to [name] by phone today to assist with re-scheduling a follow up visit with the RN Case Manager  Follow up plan: Unsuccessful telephone outreach attempt made. A HIPAA compliant phone message was left for the patient providing contact information and requesting a return call.  The care management team will reach out to the patient again over the next 7 days.  If patient returns call to provider office, please advise to call [place] at [phone].  [place] Management  Direct Dial: [phone]

## 2021-02-07 ENCOUNTER — Ambulatory Visit: Payer: Medicare PPO

## 2021-02-07 NOTE — Patient Instructions (Signed)
Visit Information  Ms. Ke  it was nice speaking with you. Please call me directly [phone] if you have questions about the goals we discussed.  Goals Addressed              This Visit's Progress   .  COMPLETED: "Sometimes my pressure goes up and sometimes it is good." (pt-stated)        Current Barriers:  [name] Knowledge Deficits related to basic understanding of hypertension pathophysiology and self care management  Case Manager Clinical Goal(s):  [name] Over the next 60 days, patient will verbalize basic understanding of hypertension disease process and self health management plan as evidenced by the patient keeping her blood pressure at or below 140/90.  Interventions:  . Evaluation of current treatment plan related to hypertension self management and patient's adherence to plan as established by provider. . Reviewed medications with patient and discussed importance of compliance . Discussed plans with patient for ongoing care management follow up and provided patient with direct contact information for care management team . The patient BP readings given to me today were Morning Readings 130/56 HR 81, 121/62 HR 75, 122/51 HR 70 . Afternoon Readings 109/56 HR 79, 121/20 HR 72, 132/49 HR 75 . Reviewed signs and symptoms of HTN . Advised to take medications as prescribed . Advised if she uses vegetables in the can pour out the liquid that the vegetables are stored in and rinse them off and then cook them.  . Congratulated her on her continued efforts.  .  Patient Self Care Activities:  . Self administers medications as prescribed . Checks BP and records as discussed . Follows a low sodium diet/DASH diet  Please see past updates related to this goal by clicking on the "Past Updates" button in the selected goal   Resolving due to duplicate    .  Monitor and Manage My Blood Sugar-Diabetes Type 2        Timeframe:  Long-Range Goal Priority:  Medium Start Date:    11/17/19                          Expected End Date:     05/22/21                - check blood sugar at prescribed times - check blood sugar if I feel it is too high or too low - take the blood sugar meter to all doctor visits    Why is this important?    Checking your blood sugar at home helps to keep it from getting very high or very low.   Writing the results in a diary or log helps the doctor know how to care for you.   Your blood sugar log should have the time, date and the results.   Also, write down the amount of insulin or other medicine that you take.   Other information, like what you ate, exercise done and how you were feeling, will also be helpful.     Notes:     .  Track and Manage My Blood Pressure-Hypertension        Timeframe:  Long-Range Goal Priority:  High Start Date:        11/17/19                     Expected End Date:      05/22/21   - check blood pressure daily - choose  a place to take my blood pressure (home, clinic or office, retail store) - write blood pressure results in a log or diary    Why is this important?    You won't feel high blood pressure, but it can still hurt your blood vessels.   High blood pressure can cause heart or kidney problems. It can also cause a stroke.   Making lifestyle changes like losing a little weight or eating less salt will help.   Checking your blood pressure at home and at different times of the day can help to control blood pressure.   If the doctor prescribes medicine remember to take it the way the doctor ordered.   Call the office if you cannot afford the medicine or if there are questions about it.     Notes:        The patient verbalized understanding of instructions, educational materials, and care plan provided today and declined offer to receive copy of patient instructions, educational materials, and care plan.   Follow up Plan: Patient would like continued follow-up.  CCM RNCN will outreach th epatient within the  next 21 days. Patient will call office if needed prior to next encounter  Lazaro Arms, RN

## 2021-02-07 NOTE — Chronic Care Management (AMB) (Signed)
  Care Management   Note  02/07/2021 Name: [name] MRN: [id number] DOB: 1941-10-14  [name] is a 80 y.o. year old female who is a primary care patient of [name], MD and is actively engaged with the care management team. I reached out to [name] by phone today to assist with re-scheduling a follow up visit with the RN Case Manager  Follow up plan: Telephone appointment with care management team member scheduled for:03/01/2021  [place] [facility]

## 2021-02-07 NOTE — Chronic Care Management (AMB) (Signed)
Care Management    RN Visit Note  02/07/2021 Name: [name] MRN: [id number] DOB: [birth year]  Subjective: [name] is a 80 y.o. year old female who is a primary care patient of [name], MD. The care management team was consulted for assistance with disease management and care coordination needs.    Engaged with patient by telephone for follow up visit in response to provider referral for case management and/or care coordination services.   Consent to Services:   [name] was given information about Care Management services today including:  1. Care Management services includes personalized support from designated clinical staff supervised by her physician, including individualized plan of care and coordination with other care providers 2. 24/7 contact phone numbers for assistance for urgent and routine care needs. 3. The patient may stop case management services at any time by phone call to the office staff.  Patient agreed to services and consent obtained.    Assessment: Patient is making progress with her diabetes and HTN . See Care Plan below for interventions and patient self-care actives. Follow up Plan: Patient would like continued follow-up.  CCM RNCM will outreach to the patient within the next 21 days. Patient will call office if needed prior to next encounter : Review of patient past medical history, allergies, medications, health status, including review of consultants reports, laboratory and other test data, was performed as part of comprehensive evaluation and provision of chronic care management services.   SDOH (Social Determinants of Health) assessments and interventions performed:    Care Plan  Allergies  Allergen Reactions  . Codeine Nausea And Vomiting  . Sulfamethoxazole-Trimethoprim Nausea And Vomiting  . Tramadol Nausea And Vomiting    Outpatient Encounter Medications as of 02/07/2021  Medication Sig  . atorvastatin (LIPITOR)  20 MG tablet TAKE ONE TABLET BY MOUTH DAILY AT 6PM  . Accu-Chek Softclix Lancets lancets Use to check capillary glucose TID  . amLODipine (NORVASC) 5 MG tablet TAKE ONE TABLET BY MOUTH DAILY  . aspirin 81 MG chewable tablet Chew 81 mg by mouth daily.  . Blood Glucose Monitoring Suppl (ACCU-CHEK GUIDE) w/Device KIT USE AS DIRECTED IN THE MORNING, AT NOON AND AT BEDTIME  . carvedilol (COREG) 25 MG tablet TAKE ONE TABLET BY MOUTH TWICE A DAY WITH MEALS  . empagliflozin (JARDIANCE) 10 MG TABS tablet Take 1 tablet (10 mg total) by mouth daily.  [name] glucose blood (ACCU-CHEK GUIDE) test strip Use to check capillary glucose TID  . insulin glargine (LANTUS) 100 UNIT/ML injection Inject 0.2 mLs (20 Units total) into the skin daily.  . Insulin Syringe-Needle U-100 (RELION INSULIN SYRINGE) 31G X 15/64" 1 ML MISC USE ONCE DAILY AS DIRECTED  . isosorbide mononitrate (IMDUR) 60 MG 24 hr tablet Take 1.5 tablets (90 mg total) by mouth daily.  [name] lisinopril-hydrochlorothiazide (ZESTORETIC) 20-12.5 MG tablet TAKE TWO TABLETS BY MOUTH DAILY  . loratadine (CLARITIN) 10 MG tablet Take 10 mg by mouth daily as needed for allergies.   . metFORMIN (GLUCOPHAGE-XR) 500 MG 24 hr tablet TAKE TWO TABLETS BY MOUTH TWICE A DAY  . Multiple Vitamin (MULTIVITAMIN WITH MINERALS) TABS tablet Take 1 tablet by mouth daily. Reported on 04/02/2016  . nitroGLYCERIN (NITROSTAT) 0.4 MG SL tablet Place 1 tablet (0.4 mg total) under the tongue every 5 (five) minutes as needed for chest pain.   No facility-administered encounter medications on file as of 02/07/2021.    Patient Active Problem List   Diagnosis Date Noted  .  Muscle strain of scapular region, left, initial encounter 01/27/2020  . Arthritis of ankle, right 11/15/2019  . CKD (chronic kidney disease) stage 3, GFR 30-59 ml/min (HCC) 11/15/2019  . History of MI (myocardial infarction) 06/18/2019  . 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 Iron deficiency anemia  02/25/2019  . Thrombocytopenia ([place]) 02/09/2019  . Dysphagia 11/13/2018  . 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 Nodule of right lung 08/29/2017  . Aortic arch atherosclerosis ([place]) 08/29/2017  . Liver cyst   . Left thyroid nodule 07/27/2014  . History of thyroid cancer 04/12/2013  . Hypothyroidism 02/19/2007  . Diabetes mellitus type 2, controlled ([place]) 02/19/2007  . HYPERCHOLESTEROLEMIA 02/19/2007  . Anemia of chronic disease 02/19/2007  . Resistant hypertension 02/19/2007  . Coronary artery disease involving native coronary artery of native heart without angina pectoris 02/19/2007    Conditions to be addressed/monitored: HTN and DMII  Care Plan : RN Case Manager  Updates made by Lazaro Arms, RN since 02/07/2021 12:00 AM  Problem: Hypertension and Diabetes   Long-Range Goal: Monitor HTN and DM II   Start Date: 11/17/2019  Expected End Date: 05/22/2021  Recent Progress: On track  Priority: Medium  Note:    Current Barriers:  . Chronic Disease Management support, education, and care coordination needs related to HTN and DMII  Clinical Goal(s) related to HTN and DMII:  Over the next 90 days, patient will:  . Work with the care management team to address educational, disease management, and care coordination needs  . Begin or continue self health monitoring activities as directed today Measure and record cbg (blood glucose) as prescribed times daily, Measure and record blood pressure  . Call provider office for new or worsened signs and symptoms  . Call care management team with questions or concerns . Verbalize basic understanding of patient centered plan of care established today  Interventions related to HTN and DMII:  . Evaluation of current treatment plans and patient's adherence to plan as established by provider . Assessed patient understanding of disease states . Provided disease specific education to patient  . Collaborated with  appropriate clinical care team members regarding patient needs . Discussed plans with patient for ongoing care management follow up and provided patient with direct contact information for care management team . Appointment reviewed: appointment next week with surgeon to review results of biopsy of spot on her tongue . healthy lifestyle promoted . Patient is eating well . activity based on tolerance and functional limitations encouraged . quality of sleep assessed-patient states that she sleeps well . pain assessed and managed- patient states that she has no pain HTN . reduction of dietary sodium encouraged . blood pressure trends reviewed-145/68 . home or ambulatory blood pressure monitoring encouraged DM II . Provided patient with written and verbal  educational materials related to hypo and hyperglycemia and importance of correct treatment . Review of patient status, including review of consultants reports, relevant laboratory and other test results, and medications completed. . blood glucose monitoring encouraged . blood glucose readings reviewed-98,99 and 100 in the am  Patient Self Care Activities related to HTN and DMII:  . check blood sugar at prescribed times . check blood sugar if I feel it is too high or too low . take the blood sugar meter to all doctor visits  Patient verbalizes understanding of plan   Self-administers medications as prescribed  Calls pharmacy for medication refills  Call's provider office for new  concerns or questions . Attends all scheduled provider appointments . Calls provider office for new concerns, questions, or BP outside discussed parameters . Checks BP and records as discussed . Follows a low sodium diet/DASH diet       Lazaro Arms RN, BSN, [place] Phone: [phone] I Fax: [phone]

## 2021-02-09 NOTE — Telephone Encounter (Signed)
Pt called RNCM rescheduled for 03/01/21

## 2021-03-01 ENCOUNTER — Telehealth: Payer: Self-pay

## 2021-03-01 ENCOUNTER — Telehealth: Payer: Medicare PPO

## 2021-03-01 NOTE — Telephone Encounter (Signed)
°  Care Management   Outreach Note  03/01/2021 Name: [name] MRN: [id number] DOB: 1941-08-16  Referred by: [name], MD Reason for referral : Chronic Care Management (DM II HTN)   An unsuccessful telephone outreach was attempted today. The patient was referred to the case management team for assistance with care management and care coordination.   Follow Up Plan: A HIPAA compliant phone message was left for the patient providing contact information and requesting a return call.  The care management team will reach out to the patient again over the next 7-14 days.   Lazaro Arms RN, BSN, [facility] Care Management Coordinator [place] Phone: [phone] I Fax: [phone]

## 2021-03-14 ENCOUNTER — Ambulatory Visit: Payer: Medicare PPO

## 2021-03-15 NOTE — Patient Instructions (Signed)
Visit Information  Ms. Reigel  it was nice speaking with you. Please call me directly [phone] if you have questions about the goals we discussed.  Goals Addressed            This Visit's Progress   . Monitor and Manage My Blood Sugar-Diabetes Type 2       Timeframe:  Long-Range Goal Priority:  Medium Start Date:    11/17/19                         Expected End Date:     06/21/21             - check blood sugar at prescribed times - check blood sugar if I feel it is too high or too low - take the blood sugar meter to all doctor visits    Why is this important?    Checking your blood sugar at home helps to keep it from getting very high or very low.   Writing the results in a diary or log helps the doctor know how to care for you.   Your blood sugar log should have the time, date and the results.   Also, write down the amount of insulin or other medicine that you take.   Other information, like what you ate, exercise done and how you were feeling, will also be helpful.     Notes:     . Track and Manage My Blood Pressure-Hypertension       Timeframe:  Long-Range Goal Priority:  High Start Date:        11/17/19                     Expected End Date:      06/21/21   - check blood pressure daily - choose a place to take my blood pressure (home, clinic or office, retail store) - write blood pressure results in a log or diary    Why is this important?    You won't feel high blood pressure, but it can still hurt your blood vessels.   High blood pressure can cause heart or kidney problems. It can also cause a stroke.   Making lifestyle changes like losing a little weight or eating less salt will help.   Checking your blood pressure at home and at different times of the day can help to control blood pressure.   If the doctor prescribes medicine remember to take it the way the doctor ordered.   Call the office if you cannot afford the medicine or if there are  questions about it.     Notes:        The patient verbalized understanding of instructions, educational materials, and care plan provided today and declined offer to receive copy of patient instructions, educational materials, and care plan.   Follow up Plan: Patient would like continued follow-up.  CCM RNCM will outreach the patient within thenext 30 days.. Patient will call office if needed prior to next encounter  Lazaro Arms, RN  [phone]

## 2021-03-15 NOTE — Chronic Care Management (AMB) (Signed)
Care Management    RN Visit Note  03/15/2021 Name: [name] MRN: [id number] DOB: 1941/12/18  Subjective: [name] is a 80 y.o. year old female who is a primary care patient of [name], MD. The care management team was consulted for assistance with disease management and care coordination needs.    Engaged with patient by telephone for follow up visit in response to provider referral for case management and/or care coordination services.     Assessment: Patient is making progress with her blood sugars and monitoringher BP . See Care Plan below for interventions and patient self-care actives. Follow up Plan: Patient would like continued follow-up.  CCM RNCM will outreach the patient within the next 30 days.. Patient will call office if needed prior to next encounter Review of patient past medical history, allergies, medications, health status, including review of consultants reports, laboratory and other test data, was performed as part of comprehensive evaluation and provision of chronic care management services.   SDOH (Social Determinants of Health) assessments and interventions performed:    Care Plan  Allergies  Allergen Reactions  . Codeine Nausea And Vomiting  . Sulfamethoxazole-Trimethoprim Nausea And Vomiting  . Tramadol Nausea And Vomiting    Outpatient Encounter Medications as of 03/14/2021  Medication Sig  . atorvastatin (LIPITOR) 20 MG tablet TAKE ONE TABLET BY MOUTH DAILY AT 6PM  . Accu-Chek Softclix Lancets lancets Use to check capillary glucose TID  . amLODipine (NORVASC) 5 MG tablet TAKE ONE TABLET BY MOUTH DAILY  . aspirin 81 MG chewable tablet Chew 81 mg by mouth daily.  . Blood Glucose Monitoring Suppl (ACCU-CHEK GUIDE) w/Device KIT USE AS DIRECTED IN THE MORNING, AT NOON AND AT BEDTIME  . carvedilol (COREG) 25 MG tablet TAKE ONE TABLET BY MOUTH TWICE A DAY WITH MEALS  . empagliflozin (JARDIANCE) 10 MG TABS tablet Take 1 tablet (10 mg  total) by mouth daily.  [name] glucose blood (ACCU-CHEK GUIDE) test strip Use to check capillary glucose TID  . insulin glargine (LANTUS) 100 UNIT/ML injection Inject 0.2 mLs (20 Units total) into the skin daily.  . Insulin Syringe-Needle U-100 (RELION INSULIN SYRINGE) 31G X 15/64" 1 ML MISC USE ONCE DAILY AS DIRECTED  . isosorbide mononitrate (IMDUR) 60 MG 24 hr tablet Take 1.5 tablets (90 mg total) by mouth daily.  [name] lisinopril-hydrochlorothiazide (ZESTORETIC) 20-12.5 MG tablet TAKE TWO TABLETS BY MOUTH DAILY  . loratadine (CLARITIN) 10 MG tablet Take 10 mg by mouth daily as needed for allergies.   . metFORMIN (GLUCOPHAGE-XR) 500 MG 24 hr tablet TAKE TWO TABLETS BY MOUTH TWICE A DAY  . Multiple Vitamin (MULTIVITAMIN WITH MINERALS) TABS tablet Take 1 tablet by mouth daily. Reported on 04/02/2016  . nitroGLYCERIN (NITROSTAT) 0.4 MG SL tablet Place 1 tablet (0.4 mg total) under the tongue every 5 (five) minutes as needed for chest pain.   No facility-administered encounter medications on file as of 03/14/2021.    Patient Active Problem List   Diagnosis Date Noted  . Muscle strain of scapular region, left, initial encounter 01/27/2020  . Arthritis of ankle, right 11/15/2019  . CKD (chronic kidney disease) stage 3, GFR 30-59 ml/min (HCC) 11/15/2019  . History of MI (myocardial infarction) 06/18/2019  . 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 Iron deficiency anemia 02/25/2019  . Thrombocytopenia ([place]) 02/09/2019  . Dysphagia 11/13/2018  . 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 Nodule of right lung 08/29/2017  .  Aortic arch atherosclerosis ([place]) 08/29/2017  . Liver cyst   . Left thyroid nodule 07/27/2014  . History of thyroid cancer 04/12/2013  . Hypothyroidism 02/19/2007  . Diabetes mellitus type 2, controlled ([place]) 02/19/2007  . HYPERCHOLESTEROLEMIA 02/19/2007  . Anemia of chronic disease 02/19/2007  . Resistant hypertension 02/19/2007  .  Coronary artery disease involving native coronary artery of native heart without angina pectoris 02/19/2007    Conditions to be addressed/monitored: HTN and DMII  Care Plan : RN Case Manager  Updates made by Lazaro Arms, RN since 03/15/2021 12:00 AM  Problem: Hypertension and Diabetes   Long-Range Goal: Monitor HTN and DM II   Start Date: 11/17/2019  Expected End Date: 05/22/2021  Recent Progress: On track  Priority: Medium  Note:    Current Barriers:  . Chronic Disease Management support, education, and care coordination needs related to HTN and DMII  Clinical Goal(s) related to HTN and DMII:  Over the next 90 days, patient will:  . Work with the care management team to address educational, disease management, and care coordination needs  . Begin or continue self health monitoring activities as directed today Measure and record cbg (blood glucose) as prescribed times daily, Measure and record blood pressure  . Call provider office for new or worsened signs and symptoms  . Call care management team with questions or concerns . Verbalize basic understanding of patient centered plan of care established today  Interventions related to HTN and DMII:  . Evaluation of current treatment plans and patient's adherence to plan as established by provider . Assessed patient understanding of disease states . Provided disease specific education to patient  . Collaborated with appropriate clinical care team members regarding patient needs . Discussed plans with patient for ongoing care management follow up and provided patient with direct contact information for care management team . Appointment reviewed: appointment next week with surgeon to review results of biopsy of spot on her tongue . healthy lifestyle promoted . Patient is eating well . activity based on tolerance and functional limitations encouraged . quality of sleep assessed-patient states that she sleeps well . pain assessed and  managed- patient states that she has no pain HTN . reduction of dietary sodium encouraged . blood pressure trends reviewed-135/60 sometimes the diastolic number is in the 50's 54 to 55 which concerns her, Advised her to discuss it with her PCP at her next visit . home or ambulatory blood pressure monitoring encouraged DM II . Provided patient with written and verbal  educational materials related to hypo and hyperglycemia and importance of correct treatment . Review of patient status, including review of consultants reports, relevant laboratory and other test results, and medications completed. . blood glucose monitoring encouraged . blood glucose readings reviewed-98,99 and 100 in the am her blood sugars remain the same in the mornings  Patient Self Care Activities related to HTN and DMII:  . check blood sugar at prescribed times . check blood sugar if I feel it is too high or too low . take the blood sugar meter to all doctor visits  Patient verbalizes understanding of plan   Self-administers medications as prescribed  Calls pharmacy for medication refills  Call's provider office for new concerns or questions . Attends all scheduled provider appointments . Calls provider office for new concerns, questions, or BP outside discussed parameters . Checks BP and records as discussed . Follows a low sodium diet/DASH diet       Rosalee Kaufman, BSN, [facility]  Care Management Coordinator [place] Phone: [phone] I Fax: [phone]

## 2021-03-16 ENCOUNTER — Other Ambulatory Visit: Payer: Self-pay

## 2021-03-16 ENCOUNTER — Ambulatory Visit
Admission: RE | Admit: 2021-03-16 | Discharge: 2021-03-16 | Disposition: A | Discharge status: Home / Self Care | Payer: Medicare PPO | Source: Ambulatory Visit | Attending: Family Medicine | Admitting: Family Medicine

## 2021-03-16 DIAGNOSIS — Z1231 Encounter for screening mammogram for malignant neoplasm of breast: Secondary | ICD-10-CM | POA: Diagnosis not present

## 2021-03-30 ENCOUNTER — Other Ambulatory Visit: Payer: Self-pay | Admitting: Family Medicine

## 2021-04-02 ENCOUNTER — Encounter: Payer: Self-pay | Admitting: Family Medicine

## 2021-04-03 ENCOUNTER — Other Ambulatory Visit: Payer: Self-pay

## 2021-04-03 ENCOUNTER — Encounter: Payer: Self-pay | Admitting: Family Medicine

## 2021-04-03 ENCOUNTER — Ambulatory Visit: Payer: Medicare PPO | Admitting: Family Medicine

## 2021-04-03 VITALS — BP 143/50 | HR 79 | Ht 62.0 in | Wt 147.4 lb

## 2021-04-03 DIAGNOSIS — N1831 Chronic kidney disease, stage 3a: Secondary | ICD-10-CM | POA: Diagnosis not present

## 2021-04-03 DIAGNOSIS — D696 Thrombocytopenia, unspecified: Secondary | ICD-10-CM | POA: Diagnosis not present

## 2021-04-03 DIAGNOSIS — E1159 Type 2 diabetes mellitus with other circulatory complications: Secondary | ICD-10-CM

## 2021-04-03 DIAGNOSIS — I10 Essential (primary) hypertension: Secondary | ICD-10-CM

## 2021-04-03 DIAGNOSIS — E7800 Pure hypercholesterolemia, unspecified: Secondary | ICD-10-CM

## 2021-04-03 DIAGNOSIS — I700 Atherosclerosis of aorta: Secondary | ICD-10-CM

## 2021-04-03 DIAGNOSIS — L720 Epidermal cyst: Secondary | ICD-10-CM

## 2021-04-03 LAB — POCT GLYCOSYLATED HEMOGLOBIN (HGB A1C): HbA1c, POC (controlled diabetic range): 6 % (ref 0.0–7.0)

## 2021-04-03 NOTE — Patient Instructions (Signed)
Epidermoid Cyst  An epidermoid cyst, also called an epidermal cyst, is a small lump under your skin. The cyst contains a substance called keratin. Do not try to pop or open the cyst yourself. What are the causes?  A blocked hair follicle.  A hair that curls and re-enters the skin instead of growing straight out of the skin.  A blocked pore.  Irritated skin.  An injury to the skin.  Certain conditions that are passed along from parent to child.  Human papillomavirus (HPV). This happens rarely when cysts occur on the bottom of the feet.  Long-term sun damage to the skin. What increases the risk?  Having acne.  Being female.  Having an injury to the skin.  Being past puberty.  Having certain conditions caused by genes (genetic disorder) What are the signs or symptoms? These cysts are usually harmless, but they can get infected. Symptoms of infection may include:  Redness.  Inflammation.  Tenderness.  Warmth.  Fever.  A bad-smelling substance that drains from the cyst.  Pus that drains from the cyst. How is this treated? In many cases, epidermoid cysts go away on their own without treatment. If a cyst becomes infected, treatment may include:  Opening and draining the cyst, done by a doctor. After draining, you may need minor surgery to remove the rest of the cyst.  Antibiotic medicine.  Shots of medicines (steroids) that help to reduce inflammation.  Surgery to remove the cyst. Surgery may be done if the cyst: ? Becomes large. ? Bothers you. ? Has a chance of turning into cancer.  Do not try to open a cyst yourself. Follow these instructions at home: Medicines  Take over-the-counter and prescription medicines as told by your doctor.  If you were prescribed an antibiotic medicine, take it as told by your doctor. Do not stop taking it even if you start to feel better. General instructions  Keep the area around your cyst clean and dry.  Wear loose, dry  clothing.  Avoid touching your cyst.  Check your cyst every day for signs of infection. Check for: ? Redness, swelling, or pain. ? Fluid or blood. ? Warmth. ? Pus or a bad smell.  Keep all follow-up visits. How is this prevented?  Wear clean, dry, clothing.  Avoid wearing tight clothing.  Keep your skin clean and dry. Take showers or baths every day. Contact a doctor if:  Your cyst has symptoms of infection.  Your condition does not improve or gets worse.  You have a cyst that looks different from other cysts you have had.  You have a fever. Get help right away if:  Redness spreads from the cyst into the area close by. Summary  An epidermoid cyst is a small lump under your skin.  If a cyst becomes infected, treatment may include surgery to open and drain the cyst, or to remove it.  Take over-the-counter and prescription medicines only as told by your doctor.  Contact a doctor if your condition is not improving or is getting worse.  Keep all follow-up visits. This information is not intended to replace advice given to you by your health care provider. Make sure you discuss any questions you have with your health care provider. Document Revised: 03/15/2020 Document Reviewed: 03/15/2020 Elsevier Patient Education  2021 [employer]vier Inc.  

## 2021-04-03 NOTE — Assessment & Plan Note (Signed)
A1C improved a lot. Continue current regimen. GLP1 discussed. She does not wish to start additional injectables for now. Discuss in the future.

## 2021-04-03 NOTE — Assessment & Plan Note (Signed)
BP improved. Diastolic a bit low, but asymptomatic. Monitor closely on current regimen.

## 2021-04-03 NOTE — Assessment & Plan Note (Addendum)
Stable. Improved with last lab check. Monitor closely.

## 2021-04-03 NOTE — Assessment & Plan Note (Signed)
Stable. Monitor closely.

## 2021-04-03 NOTE — Assessment & Plan Note (Signed)
Does not appear infected. Conservative measures for now. I discussed surgical cyst removal. She would think about this and schedule derm appointment in the future.

## 2021-04-03 NOTE — Assessment & Plan Note (Signed)
Continue Statin therapy. Return in 3-6 months for FLP.

## 2021-04-03 NOTE — Progress Notes (Signed)
    SUBJECTIVE:   CHIEF COMPLAINT / HPI:   HTN/DM2/HLD/Artheriosclerosis/CKD:  Here to f/u with her DM and BP. She is compliant with her Norvasc 5 mg QD, Coreg 25 mg BID, Zestoretic 20/12.5 mg 2 tabs daily, and Imdur 60 mg QD for her BP and her Jardiance 10 mg QD, Metformin XR 1000 mg BID and Lantus 20 units QD. She denies hypoglycemic episodes. Her home CBGs ranges from 90-110s. Home BP is usually in the 240X systolic and 73-53G diastolic. No respiratory or cardiac, or neurologic symptoms. She is compliant with her Lipitor for cholesterol.   Skin bump:  C/O bump on her back for years but flared three weeks ago. She described the bump as being red and painful. She popped it a few days ago, and the pain and redness have since improved--no other concerns.  PERTINENT  PMH / PSH: PMX reviewed  OBJECTIVE:   Vitals:   04/03/21 1038 04/03/21 1056  BP: (!) 147/59 (!) 143/50  Pulse: 79   SpO2: 99%   Weight: 147 lb 6.4 oz (66.9 kg)   Height: 5\' 2"  (1.575 m)    Physical Exam Vitals and nursing note reviewed.  Cardiovascular:     Rate and Rhythm: Normal rate and regular rhythm.     Heart sounds: Normal heart sounds. No murmur heard.   Pulmonary:     Effort: Pulmonary effort is normal. No respiratory distress.     Breath sounds: Normal breath sounds. No wheezing.  Abdominal:     General: Bowel sounds are normal. There is no distension.     Palpations: Abdomen is soft.     Tenderness: There is no abdominal tenderness.  Musculoskeletal:     Right lower leg: No edema.     Left lower leg: No edema.  Skin:    Comments: Raised, firm, nodular lesion on her left upper back. Two spots on the swelling, which her hyperpigmented. No erythema. No tenderness         ASSESSMENT/PLAN:   Resistant hypertension BP improved. Diastolic a bit low, but asymptomatic. Monitor closely on current regimen.  Diabetes mellitus type 2, controlled ([place]) A1C improved a lot. Continue current  regimen. GLP1 discussed. She does not wish to start additional injectables for now. Discuss in the future.  Aortic arch atherosclerosis (HCC) Continue Statin therapy.  CKD (chronic kidney disease) stage 3, GFR 30-59 ml/min Stable. Monitor closely.  HYPERCHOLESTEROLEMIA Continue Statin therapy. Return in 3-6 months for FLP.  Inclusion cyst Does not appear infected. Conservative measures for now. I discussed surgical cyst removal. She would think about this and schedule derm appointment in the future.  Thrombocytopenia (HCC) Stable. Improved with last lab check. Monitor closely.     Andrena Mews, MD [place]

## 2021-04-03 NOTE — Assessment & Plan Note (Signed)
Continue Statin therapy.

## 2021-04-04 ENCOUNTER — Other Ambulatory Visit: Payer: Self-pay | Admitting: Family Medicine

## 2021-04-09 ENCOUNTER — Other Ambulatory Visit: Payer: Self-pay | Admitting: Family Medicine

## 2021-04-11 ENCOUNTER — Telehealth: Payer: Medicare PPO

## 2021-04-16 ENCOUNTER — Ambulatory Visit: Payer: Medicare PPO

## 2021-04-16 NOTE — Chronic Care Management (AMB) (Signed)
Care Management    RN Visit Note  04/16/2021 Name: [name] MRN: [id number] DOB: 1941/09/17  Subjective: [name] is a 80 y.o. year old female who is a primary care patient of [name], MD. The care management team was consulted for assistance with disease management and care coordination needs.    Engaged with patient by telephone for follow up visit in response to provider referral for case management and/or care coordination services.   The patient was given information about Chronic Care Management services, agreed to services, and gave verbal consent prior to initiation of services.  Please see initial visit note for detailed documentation.  Patient agreed to services and consent obtained.    Assessment: Patient is making progress with her blood suga and blood pressure by monitoring her food intake and taking her medications. . See Care Plan below for interventions and patient self-care actives. Follow up Plan: Patient would like continued follow-up.  CCM RNCM will outreach the patient within the next 5 weeks.  Patient will call office if needed prior to next encounter  Review of patient past medical history, allergies, medications, health status, including review of consultants reports, laboratory and other test data, was performed as part of comprehensive evaluation and provision of chronic care management services.   SDOH (Social Determinants of Health) assessments and interventions performed:    Care Plan  Allergies  Allergen Reactions  . Codeine Nausea And Vomiting  . Sulfamethoxazole-Trimethoprim Nausea And Vomiting  . Tramadol Nausea And Vomiting    Outpatient Encounter Medications as of 04/16/2021  Medication Sig  . Accu-Chek Softclix Lancets lancets Use to check capillary glucose TID  . amLODipine (NORVASC) 5 MG tablet TAKE ONE TABLET BY MOUTH DAILY  . aspirin 81 MG chewable tablet Chew 81 mg by mouth daily.  [name] atorvastatin (LIPITOR) 20 MG  tablet TAKE ONE TABLET BY MOUTH DAILY AT 6PM  . Blood Glucose Monitoring Suppl (ACCU-CHEK GUIDE) w/Device KIT USE AS DIRECTED IN THE MORNING, AT NOON AND AT BEDTIME  . carvedilol (COREG) 25 MG tablet TAKE ONE TABLET BY MOUTH TWICE A DAY WITH MEALS  . empagliflozin (JARDIANCE) 10 MG TABS tablet Take 1 tablet (10 mg total) by mouth daily.  [name] glucose blood (ACCU-CHEK GUIDE) test strip Use to check capillary glucose TID  . insulin glargine (LANTUS) 100 UNIT/ML injection Inject 0.2 mLs (20 Units total) into the skin daily.  . isosorbide mononitrate (IMDUR) 60 MG 24 hr tablet Take 1.5 tablets (90 mg total) by mouth daily.  [name] lisinopril-hydrochlorothiazide (ZESTORETIC) 20-12.5 MG tablet TAKE TWO TABLETS BY MOUTH DAILY  . loratadine (CLARITIN) 10 MG tablet Take 10 mg by mouth daily as needed for allergies.  (Patient not taking: Reported on 04/03/2021)  . metFORMIN (GLUCOPHAGE-XR) 500 MG 24 hr tablet TAKE TWO TABLETS BY MOUTH TWICE A DAY  . Multiple Vitamin (MULTIVITAMIN WITH MINERALS) TABS tablet Take 1 tablet by mouth daily. Reported on 04/02/2016  . nitroGLYCERIN (NITROSTAT) 0.4 MG SL tablet Place 1 tablet (0.4 mg total) under the tongue every 5 (five) minutes as needed for chest pain. (Patient not taking: Reported on 04/03/2021)  . RELION INSULIN SYRINGE 31G X 15/64" 1 ML MISC USE ONCE DAILY AS DIRECTED   No facility-administered encounter medications on file as of 04/16/2021.    Patient Active Problem List   Diagnosis Date Noted  . Inclusion cyst 04/03/2021  . Arthritis of ankle, right 11/15/2019  . CKD (chronic kidney disease) stage 3, GFR 30-59 ml/min (  [place]) 11/15/2019  . History of MI (myocardial infarction) 06/18/2019  . 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 Iron deficiency anemia 02/25/2019  . Thrombocytopenia ([place]) 02/09/2019  . 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 Nodule of right lung 08/29/2017  . Aortic arch atherosclerosis ([place])  08/29/2017  . Liver cyst   . Left thyroid nodule 07/27/2014  . History of thyroid cancer 04/12/2013  . Hypothyroidism 02/19/2007  . Diabetes mellitus type 2, controlled ([place]) 02/19/2007  . HYPERCHOLESTEROLEMIA 02/19/2007  . Anemia of chronic disease 02/19/2007  . Resistant hypertension 02/19/2007  . Coronary artery disease involving native coronary artery of native heart without angina pectoris 02/19/2007    Conditions to be addressed/monitored: HTN and DMII  Care Plan : RN Case Manager  Updates made by Lazaro Arms, RN since 04/16/2021 12:00 AM  Problem: Hypertension and Diabetes   Long-Range Goal: Monitor HTN and DM II   Start Date: 11/17/2019  Expected End Date: 05/22/2021  Recent Progress: On track  Priority: Medium   Current Barriers:  . Chronic Disease Management support, education, and care coordination needs related to HTN and DMII  Clinical Goal(s) related to HTN and DMII:  Over the next 90 days, patient will:  . Work with the care management team to address educational, disease management, and care coordination needs  . Begin or continue self health monitoring activities as directed today Measure and record cbg (blood glucose) as prescribed times daily, Measure and record blood pressure  . Call provider office for new or worsened signs and symptoms  . Call care management team with questions or concerns . Verbalize basic understanding of patient centered plan of care established today  Interventions related to HTN and DMII:  . Evaluation of current treatment plans and patient's adherence to plan as established by provider . Assessed patient understanding of disease states . Provided disease specific education to patient  . Collaborated with appropriate clinical care team members regarding patient needs . Discussed plans with patient for ongoing care management follow up and provided patient with direct contact information for care management team . Appointment  reviewed: appointment next week with surgeon to review results of biopsy of spot on her tongue . healthy lifestyle promoted . Patient is eating well . activity based on tolerance and functional limitations encouraged . quality of sleep assessed-patient states that she sleeps well . pain assessed and managed- patient states that she has no pain HTN . reduction of dietary sodium encouraged . blood pressure trends reviewed- Patient states that she has discussed her BP with her physician and it fine.  Her BP today is 128/56 HR 72 . home or ambulatory blood pressure monitoring encouraged DM II . Provided patient with written and verbal  educational materials related to hypo and hyperglycemia and importance of correct treatment . Review of patient status, including review of consultants reports, relevant laboratory and other test results, and medications completed. . blood glucose monitoring encouraged . blood glucose readings reviewed-FBS 107    Patient Self Care Activities related to HTN and DMII:  . check blood sugar at prescribed times . take the blood sugar meter to all doctor visits  Patient verbalizes understanding of plan   Self-administers medications as prescribed  Calls pharmacy for medication refills  Call's provider office for new concerns or questions . Attends all scheduled provider appointments . Calls provider office for new concerns, questions, or BP outside discussed parameters . Checks BP and records as  discussed . Follows a low sodium diet/DASH diet       Lazaro Arms RN, BSN, [place] Phone: [phone] I Fax: [phone]

## 2021-04-17 NOTE — Patient Instructions (Signed)
Visit Information  Ms. Stauffer  it was nice speaking with you. Please call me directly [phone] if you have questions about the goals we discussed.  Goals Addressed            This Visit's Progress   . Monitor and Manage My Blood Sugar-Diabetes Type 2       Timeframe:  Long-Range Goal Priority:  Medium Start Date:    11/17/19                         Expected End Date:     07/20/21   - check blood sugar at prescribed times - check blood sugar if I feel it is too high or too low - take the blood sugar meter to all doctor visits    Why is this important?    Checking your blood sugar at home helps to keep it from getting very high or very low.   Writing the results in a diary or log helps the doctor know how to care for you.   Your blood sugar log should have the time, date and the results.   Also, write down the amount of insulin or other medicine that you take.   Other information, like what you ate, exercise done and how you were feeling, will also be helpful.     Notes:     . Track and Manage My Blood Pressure-Hypertension       Timeframe:  Long-Range Goal Priority:  High Start Date:        11/17/19                     Expected End Date:      07/20/21   - check blood pressure daily - choose a place to take my blood pressure (home, clinic or office, retail store) - write blood pressure results in a log or diary    Why is this important?    You won't feel high blood pressure, but it can still hurt your blood vessels.   High blood pressure can cause heart or kidney problems. It can also cause a stroke.   Making lifestyle changes like losing a little weight or eating less salt will help.   Checking your blood pressure at home and at different times of the day can help to control blood pressure.   If the doctor prescribes medicine remember to take it the way the doctor ordered.   Call the office if you cannot afford the medicine or if there are questions about it.      Notes:        The patient verbalized understanding of instructions, educational materials, and care plan provided today and declined offer to receive copy of patient instructions, educational materials, and care plan.   Follow up Plan: Patient would like continued follow-up.  CCM RNCM will outreach the patient within the next 5 weeks.  Patient will call office if needed prior to next encounter  Lazaro Arms, RN  [phone]

## 2021-05-15 ENCOUNTER — Telehealth: Payer: Medicare PPO

## 2021-05-15 ENCOUNTER — Telehealth: Payer: Self-pay

## 2021-05-15 NOTE — Telephone Encounter (Signed)
   RN Case Manager Care Management   Phone Outreach    05/15/2021 Name: [name] MRN: [id number] DOB: [birth year]  [name] is a 80 y.o. year old female who is a primary care patient of [place], Phill Myron, MD .   Telephone outreach was unsuccessful A HIPPA compliant phone message was left for the patient providing contact information and requesting a return call.   Plan:Will route chart to Care Guide to see if patient would like to reschedule phone appointment   Review of patient status, including review of consultants reports, relevant laboratory and other test results, and collaboration with appropriate care team members and the patient's provider was performed as part of comprehensive patient evaluation and provision of care management services.    Lazaro Arms RN, BSN, [facility] Care Management Coordinator [place] Phone: [phone] I Fax: [phone]

## 2021-05-16 NOTE — Telephone Encounter (Signed)
Rescheduled to 05/24/21 [name]

## 2021-05-22 ENCOUNTER — Telehealth: Payer: Medicare PPO

## 2021-05-24 ENCOUNTER — Ambulatory Visit: Payer: Medicare PPO

## 2021-05-24 NOTE — Chronic Care Management (AMB) (Signed)
Care Management    RN Visit Note  05/24/2021 Name: [name] MRN: [id number] DOB: 1941-07-10  Subjective: [name] is a 80 y.o. year old female who is a primary care patient of [name], MD. The care management team was consulted for assistance with disease management and care coordination needs.    Engaged with patient by telephone for follow up visit in response to provider referral for case management and/or care coordination services.   Consent to Services:   [name] was given information about Care Management services today including:  1. Care Management services includes personalized support from designated clinical staff supervised by her physician, including individualized plan of care and coordination with other care providers 2. 24/7 contact phone numbers for assistance for urgent and routine care needs. 3. The patient may stop case management services at any time by phone call to the office staff.  Patient agreed to services and consent obtained.    Assessment: Patient is making progress with HTN and DM . See Care Plan below for interventions and patient self-care actives. Follow up Plan: Patient would like continued follow-up.  CCM RNCM will outreach the patient within the next 8 weeks  Patient will call office if needed prior to next encounter: Review of patient past medical history, allergies, medications, health status, including review of consultants reports, laboratory and other test data, was performed as part of comprehensive evaluation and provision of chronic care management services.   SDOH (Social Determinants of Health) assessments and interventions performed:    Care Plan  Allergies  Allergen Reactions  . Codeine Nausea And Vomiting  . Sulfamethoxazole-Trimethoprim Nausea And Vomiting  . Tramadol Nausea And Vomiting    Outpatient Encounter Medications as of 05/24/2021  Medication Sig  . Accu-Chek Softclix Lancets lancets Use to  check capillary glucose TID  . amLODipine (NORVASC) 5 MG tablet TAKE ONE TABLET BY MOUTH DAILY  . aspirin 81 MG chewable tablet Chew 81 mg by mouth daily.  [name] atorvastatin (LIPITOR) 20 MG tablet TAKE ONE TABLET BY MOUTH DAILY AT 6PM  . Blood Glucose Monitoring Suppl (ACCU-CHEK GUIDE) w/Device KIT USE AS DIRECTED IN THE MORNING, AT NOON AND AT BEDTIME  . carvedilol (COREG) 25 MG tablet TAKE ONE TABLET BY MOUTH TWICE A DAY WITH MEALS  . empagliflozin (JARDIANCE) 10 MG TABS tablet Take 1 tablet (10 mg total) by mouth daily.  [name] glucose blood (ACCU-CHEK GUIDE) test strip Use to check capillary glucose TID  . insulin glargine (LANTUS) 100 UNIT/ML injection Inject 0.2 mLs (20 Units total) into the skin daily.  . isosorbide mononitrate (IMDUR) 60 MG 24 hr tablet Take 1.5 tablets (90 mg total) by mouth daily.  [name] lisinopril-hydrochlorothiazide (ZESTORETIC) 20-12.5 MG tablet TAKE TWO TABLETS BY MOUTH DAILY  . loratadine (CLARITIN) 10 MG tablet Take 10 mg by mouth daily as needed for allergies.  (Patient not taking: Reported on 04/03/2021)  . metFORMIN (GLUCOPHAGE-XR) 500 MG 24 hr tablet TAKE TWO TABLETS BY MOUTH TWICE A DAY  . Multiple Vitamin (MULTIVITAMIN WITH MINERALS) TABS tablet Take 1 tablet by mouth daily. Reported on 04/02/2016  . nitroGLYCERIN (NITROSTAT) 0.4 MG SL tablet Place 1 tablet (0.4 mg total) under the tongue every 5 (five) minutes as needed for chest pain. (Patient not taking: Reported on 04/03/2021)  . RELION INSULIN SYRINGE 31G X 15/64" 1 ML MISC USE ONCE DAILY AS DIRECTED   No facility-administered encounter medications on file as of 05/24/2021.    Patient Active  Problem List   Diagnosis Date Noted  . Inclusion cyst 04/03/2021  . Arthritis of ankle, right 11/15/2019  . CKD (chronic kidney disease) stage 3, GFR 30-59 ml/min (HCC) 11/15/2019  . History of MI (myocardial infarction) 06/18/2019  . 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 Iron deficiency anemia  02/25/2019  . Thrombocytopenia ([place]) 02/09/2019  . 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 Nodule of right lung 08/29/2017  . Aortic arch atherosclerosis ([place]) 08/29/2017  . Liver cyst   . Left thyroid nodule 07/27/2014  . History of thyroid cancer 04/12/2013  . Hypothyroidism 02/19/2007  . Diabetes mellitus type 2, controlled ([place]) 02/19/2007  . HYPERCHOLESTEROLEMIA 02/19/2007  . Anemia of chronic disease 02/19/2007  . Resistant hypertension 02/19/2007  . Coronary artery disease involving native coronary artery of native heart without angina pectoris 02/19/2007    Conditions to be addressed/monitored: HTN and DMII  Care Plan : RN Case Manager  Updates made by Lazaro Arms, RN since 05/24/2021 12:00 AM    Problem: Hypertension and Diabetes     Long-Range Goal: Monitor HTN and DM II   Start Date: 11/17/2019  Expected End Date: 09/21/2021  This Visit's Progress: On track  Recent Progress: On track  Priority: Medium  Note:    Current Barriers:  . Chronic Disease Management support, education, and care coordination needs related to HTN and DMII  Clinical Goal(s) related to HTN and DMII:  Over the next 90 days, patient will:  . Work with the care management team to address educational, disease management, and care coordination needs  . Begin or continue self health monitoring activities as directed today Measure and record cbg (blood glucose) as prescribed times daily, Measure and record blood pressure  . Call provider office for new or worsened signs and symptoms  . Call care management team with questions or concerns . Verbalize basic understanding of patient centered plan of care established today  Interventions related to HTN and DMII:  . Evaluation of current treatment plans and patient's adherence to plan as established by provider . Assessed patient understanding of disease states . Provided disease specific education to patient   . Collaborated with appropriate clinical care team members regarding patient needs . Discussed plans with patient for ongoing care management follow up and provided patient with direct contact information for care management team . Appointment reviewed: appointment next week with surgeon to review results of biopsy of spot on her tongue . healthy lifestyle promoted . Patient is eating well but she is not eating a snack at night.  Encouraged Patient to have a small snack such as peanut butter and crackers. [name] activity based on tolerance and functional limitations encouraged . quality of sleep assessed-patient states that she sleeps well . pain assessed and managed- patient states that she has no pain HTN . reduction of dietary sodium encouraged . blood pressure trends reviewed- Her BP today is 121/63 . home or ambulatory blood pressure monitoring encouraged DM II . Provided patient with written and verbal  educational materials related to hypo and hyperglycemia and importance of correct treatment . Review of patient status, including review of consultants reports, relevant laboratory and other test results, and medications completed. . blood glucose monitoring encouraged . blood glucose readings reviewed- today BS 125  Patient Self Care Activities related to HTN and DMII:  . check blood sugar at prescribed times . take the blood sugar meter to all doctor visits  Patient verbalizes  understanding of plan   Self-administers medications as prescribed  Calls pharmacy for medication refills  Call's provider office for new concerns or questions . Attends all scheduled provider appointments . Calls provider office for new concerns, questions, or BP outside discussed parameters . Checks BP and records as discussed . Follows a low sodium diet/DASH diet       Plan: Telephone follow up appointment with care management team member scheduled for:  8 weeks  Lazaro Arms RN, BSN, [facility] Care  Management Coordinator [place] Phone: [phone] I Fax: [phone]

## 2021-05-24 NOTE — Patient Instructions (Signed)
Visit Information  [name]  it was nice speaking with you. Please call me directly [phone] if you have questions about the goals we discussed.  Goals Addressed            This Visit's Progress   . Monitor and Manage My Blood Sugar-Diabetes Type 2   On track    Timeframe:  Long-Range Goal Priority:  Medium Start Date:    11/17/19                         Expected End Date:     08/3021   - check blood sugar at prescribed times - check blood sugar if I feel it is too high or too low - take the blood sugar meter to all doctor visits    Why is this important?    Checking your blood sugar at home helps to keep it from getting very high or very low.   Writing the results in a diary or log helps the doctor know how to care for you.   Your blood sugar log should have the time, date and the results.   Also, write down the amount of insulin or other medicine that you take.   Other information, like what you ate, exercise done and how you were feeling, will also be helpful.     Notes:     . Track and Manage My Blood Pressure-Hypertension       Timeframe:  Long-Range Goal Priority:  High Start Date:        11/17/19                     Expected End Date:      09/21/21   - check blood pressure daily - choose a place to take my blood pressure (home, clinic or office, retail store) - write blood pressure results in a log or diary    Why is this important?    You won't feel high blood pressure, but it can still hurt your blood vessels.   High blood pressure can cause heart or kidney problems. It can also cause a stroke.   Making lifestyle changes like losing a little weight or eating less salt will help.   Checking your blood pressure at home and at different times of the day can help to control blood pressure.   If the doctor prescribes medicine remember to take it the way the doctor ordered.   Call the office if you cannot afford the medicine or if there are questions  about it.     Notes:        The patient verbalized understanding of instructions, educational materials, and care plan provided today and declined offer to receive copy of patient instructions, educational materials, and care plan.   Follow up Plan: Patient would like continued follow-up.  CCM RNCM will outreach the patient within the next 8 weeks  Patient will call office if needed prior to next encounter  Lazaro Arms, RN  [phone]

## 2021-06-02 ENCOUNTER — Other Ambulatory Visit: Payer: Self-pay | Admitting: Physician Assistant

## 2021-06-20 ENCOUNTER — Emergency Department (HOSPITAL_COMMUNITY): Payer: Medicare PPO

## 2021-06-20 ENCOUNTER — Emergency Department (HOSPITAL_COMMUNITY)
Admission: EM | Admit: 2021-06-20 | Discharge: 2021-06-20 | Disposition: A | Discharge status: Home / Self Care | Payer: Medicare PPO | Attending: Emergency Medicine | Admitting: Emergency Medicine

## 2021-06-20 ENCOUNTER — Other Ambulatory Visit: Payer: Self-pay

## 2021-06-20 ENCOUNTER — Telehealth: Payer: Self-pay

## 2021-06-20 ENCOUNTER — Encounter (HOSPITAL_COMMUNITY): Payer: Self-pay

## 2021-06-20 DIAGNOSIS — I129 Hypertensive chronic kidney disease with stage 1 through stage 4 chronic kidney disease, or unspecified chronic kidney disease: Secondary | ICD-10-CM | POA: Diagnosis not present

## 2021-06-20 DIAGNOSIS — Z7984 Long term (current) use of oral hypoglycemic drugs: Secondary | ICD-10-CM | POA: Insufficient documentation

## 2021-06-20 DIAGNOSIS — Z79899 Other long term (current) drug therapy: Secondary | ICD-10-CM | POA: Diagnosis not present

## 2021-06-20 DIAGNOSIS — M546 Pain in thoracic spine: Secondary | ICD-10-CM | POA: Diagnosis not present

## 2021-06-20 DIAGNOSIS — E039 Hypothyroidism, unspecified: Secondary | ICD-10-CM | POA: Diagnosis not present

## 2021-06-20 DIAGNOSIS — I2510 Atherosclerotic heart disease of native coronary artery without angina pectoris: Secondary | ICD-10-CM | POA: Diagnosis not present

## 2021-06-20 DIAGNOSIS — N183 Chronic kidney disease, stage 3 unspecified: Secondary | ICD-10-CM | POA: Diagnosis not present

## 2021-06-20 DIAGNOSIS — Z7982 Long term (current) use of aspirin: Secondary | ICD-10-CM | POA: Insufficient documentation

## 2021-06-20 DIAGNOSIS — Z794 Long term (current) use of insulin: Secondary | ICD-10-CM | POA: Diagnosis not present

## 2021-06-20 DIAGNOSIS — Z87891 Personal history of nicotine dependence: Secondary | ICD-10-CM | POA: Diagnosis not present

## 2021-06-20 DIAGNOSIS — Z85850 Personal history of malignant neoplasm of thyroid: Secondary | ICD-10-CM | POA: Diagnosis not present

## 2021-06-20 DIAGNOSIS — R079 Chest pain, unspecified: Secondary | ICD-10-CM | POA: Insufficient documentation

## 2021-06-20 DIAGNOSIS — E1122 Type 2 diabetes mellitus with diabetic chronic kidney disease: Secondary | ICD-10-CM | POA: Insufficient documentation

## 2021-06-20 LAB — TROPONIN I (HIGH SENSITIVITY)
Troponin I (High Sensitivity): 4 ng/L (ref <18)
Troponin I (High Sensitivity): 3 ng/L (ref <18)

## 2021-06-20 LAB — CBC WITH DIFFERENTIAL/PLATELET
Abs Immature Granulocytes: 0.01 10*3/uL (ref 0.00–0.07)
Basophils Absolute: 0.1 10*3/uL (ref 0.0–0.1)
Basophils Relative: 1 %
Eosinophils Absolute: 0.1 10*3/uL (ref 0.0–0.5)
Eosinophils Relative: 3 %
HCT: 33.8 % — ABNORMAL LOW (ref 36.0–46.0)
Hemoglobin: 10.3 g/dL — ABNORMAL LOW (ref 12.0–15.0)
Immature Granulocytes: 0 %
Lymphocytes Relative: 39 %
Lymphs Abs: 2.1 10*3/uL (ref 0.7–4.0)
MCH: 27.8 pg (ref 26.0–34.0)
MCHC: 30.5 g/dL (ref 30.0–36.0)
MCV: 91.4 fL (ref 80.0–100.0)
Monocytes Absolute: 0.6 10*3/uL (ref 0.1–1.0)
Monocytes Relative: 12 %
Neutro Abs: 2.4 10*3/uL (ref 1.7–7.7)
Neutrophils Relative %: 45 %
Platelets: 100 10*3/uL — ABNORMAL LOW (ref 150–400)
RBC: 3.7 MIL/uL — ABNORMAL LOW (ref 3.87–5.11)
RDW: 15.1 % (ref 11.5–15.5)
WBC: 5.3 10*3/uL (ref 4.0–10.5)
nRBC: 0 % (ref 0.0–0.2)

## 2021-06-20 LAB — COMPREHENSIVE METABOLIC PANEL
ALT: 18 U/L (ref 0–44)
AST: 20 U/L (ref 15–41)
Albumin: 3.7 g/dL (ref 3.5–5.0)
Alkaline Phosphatase: 70 U/L (ref 38–126)
Anion gap: 7 (ref 5–15)
BUN: 23 mg/dL (ref 8–23)
CO2: 28 mmol/L (ref 22–32)
Calcium: 9.5 mg/dL (ref 8.9–10.3)
Chloride: 104 mmol/L (ref 98–111)
Creatinine, Ser: 1.22 mg/dL — ABNORMAL HIGH (ref 0.44–1.00)
GFR, Estimated: 45 mL/min — ABNORMAL LOW (ref >60)
Glucose, Bld: 289 mg/dL — ABNORMAL HIGH (ref 70–99)
Potassium: 4.4 mmol/L (ref 3.5–5.1)
Sodium: 139 mmol/L (ref 135–145)
Total Bilirubin: 0.5 mg/dL (ref 0.3–1.2)
Total Protein: 6.8 g/dL (ref 6.5–8.1)

## 2021-06-20 NOTE — Discharge Instructions (Addendum)
1.  At this time, your evaluation does not show signs of a heart attack.  It is still very important that you follow-up with your doctor for recheck and continued monitoring. 2.  I suspect your pain is due to muscle strain in your back.  Take extra strength Tylenol every 6-8 hours for pain.  You may also use an over-the-counter pain patch such as Salonpas or Lidoderm patch. 3.  Return to emergency department immediately if you develop shortness of breath, cough, general weakness or feeling like you will pass out or other concerning symptoms

## 2021-06-20 NOTE — ED Provider Notes (Signed)
Emergency Medicine Provider Triage Evaluation Note  [name] , a 80 y.o. female  was evaluated in triage.  Pt complains of upper left back pain.  On my examination, patient states that she felt mildly nauseated 6 days ago, but those symptoms have largely resolved.  Yesterday she developed left thoracic region back pain.  Not aggravated by or reproducible with movement.  She denies any cough, shortness of breath, or diaphoresis.  No left-sided chest pain.  Denies any history of clots or clotting disorder, recent surgeries or immobilizations, recent leg swelling.  She tried taking nitroglycerin which helped.  She is followed by Dr. Johnsie Cancel, cardiology.  Review of Systems  Positive: Left-sided thoracic region back pain Negative: Fevers, chills, cough, SOB  Physical Exam  LMP 11/24/1992  Gen:   Awake, no distress   Resp:  Normal effort MSK:   Moves extremities without difficulty  Other:    Medical Decision Making  Medically screening exam initiated at 11:29 AM.  Appropriate orders placed.  [name] was informed that the remainder of the evaluation will be completed by another provider, this initial triage assessment does not replace that evaluation, and the importance of remaining in the ED until their evaluation is complete.  Chest x-ray Cardiac work-up   Reita Chard 06/20/21 1129    Sherwood Gambler, MD 06/23/21 1520

## 2021-06-20 NOTE — ED Notes (Signed)
Walked patient to the bathroom patient did well patient is now back in bed on the monitor  

## 2021-06-20 NOTE — ED Triage Notes (Signed)
Pt presents with Left upper back/shoulder pain radiating into her Left chest area. Pt denies any other symptoms.

## 2021-06-20 NOTE — Telephone Encounter (Signed)
Patient calls nurse line with concerns regarding left sided shoulder and back pain. Onset 5 pm yesterday afternoon.   Denies SHOB, nausea, vomiting and jaw pain. Patient reports taking tylenol extra strength around 5 pm yesterday that helped with pain. Patient also states that she took nitroglycerin around 9:00 pm last night. Patient reports that at times pain radiates into chest. Patient reports history of MI in 2020 and that this discomfort feels similar to that event.   Advised patient to receive further evaluation in the ED. Patient agreeable. Offered to call ambulance for patient, patient declined and states that she has a ride to take her.   Talbot Grumbling, RN

## 2021-06-20 NOTE — ED Provider Notes (Signed)
[facility] EMERGENCY DEPARTMENT Provider Note   CSN: [id number] Arrival date & time: 06/20/21  1057     History Chief Complaint  Patient presents with   Back Pain   Chest Pain    [name] is a 80 y.o. female.  HPI Reports she was having some pain in her thoracic back on the left yesterday.  She reports she took some Tylenol and it felt better but was still little uncomfortable.  She reports today is nearly resolved.  She denies she has had a stroke she gaited shortness of breath nausea or vomiting.  No syncope.  Patient ports she feels fine otherwise.  She did not take her medications this morning except her insulin.  She reports it is somewhat similar to pain she had before when she needed a stent.    Past Medical History:  Diagnosis Date   Aortic arch atherosclerosis ([place]) 08/29/2017   Carpal tunnel syndrome 02/19/2007   Qualifier: Diagnosis of  By: Samara Snide     Coronary artery disease    a. Remote PTCA of the PDA;  b. 02/2009 PCI/CBA to the D1;  c. 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 thyroid lobectomy   History of thyroid cancer    Hypothyroid    IBS (irritable bowel syndrome) 12/27/2014   Nodule of right lung 08/29/2017   3 mm stable nodule right upper lobe on CTA Chest 08/28/17   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 08/29/2017   Subscapular pain, left 08/29/2017    Patient Active Problem List   Diagnosis Date Noted   Inclusion cyst 04/03/2021   Arthritis of ankle, right 11/15/2019   CKD (chronic kidney disease) stage 3, GFR 30-59 ml/min ([place] 11/15/2019   History of MI (myocardial infarction) 06/18/2019   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    Iron deficiency anemia 02/25/2019   Thrombocytopenia ([place] 02/09/2019   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 08/29/2017   Nodule of right lung 08/29/2017   [place] arch atherosclerosis ([place] 08/29/2017   Liver cyst    Left thyroid nodule 07/27/2014   History of thyroid cancer 04/12/2013   Hypothyroidism 02/19/2007   Diabetes mellitus type 2, controlled ([place] 02/19/2007   HYPERCHOLESTEROLEMIA 02/19/2007   Anemia of chronic disease 02/19/2007   Resistant hypertension 02/19/2007   Coronary artery disease involving native coronary artery of native heart without angina pectoris 02/19/2007    Past Surgical History:  Procedure Laterality Date   ANGIOPLASTY     PDA 90-40 %   BARTHOLIN GLAND CYST EXCISION  09-13-2004   Keratosis- no CA   BREAST BIOPSY     LEFT SPOT REMOVED   BREAST EXCISIONAL BIOPSY Left    CARDIAC CATHETERIZATION  03/15/2009   stenosis of diagonal branch of LAD- PTCA performed   CHOLECYSTECTOMY  08-23-2002   CORONARY ANGIOPLASTY     ENDOMETRIAL BIOPSY  02-20-1998   LEFT HEART CATHETERIZATION WITH CORONARY ANGIOGRAM N/A 11/26/2011   Procedure: LEFT HEART CATHETERIZATION WITH CORONARY ANGIOGRAM;  Surgeon: Peter M JMartinique MD;  [facility];  Service: Cardiovascular;  Laterality: N/A;   PTCA  10/23/2000 and 03/15/09   THYROIDECTOMY Right  10-06-1991   Hashimotos and papillary carcinoma   TONSILLECTOMY     TUBAL LIGATION       OB History   No obstetric history on file.     Family History  Problem Relation Age of Onset   Diabetes Mother        Deceased- hypoglycemic coma    Hypertension Mother    Other Father        unaware of his health history - died when pt was 80 yr old.   Heart attack Sister    Diabetes Mellitus II Brother    Heart attack Brother    Heart attack Brother    Diabetes Mellitus II Brother    Epilepsy Brother    Breast cancer Daughter     Social History   Tobacco Use   Smoking status: Former    Pack years: 0.00     Types: Cigarettes    Quit date: 12/23/1980    Years since quitting: 40.5   Smokeless tobacco: Former    Types: Chew    Quit date: 08/23/1994  Vaping Use   Vaping Use: Never used  Substance Use Topics   Alcohol use: No   Drug use: No    Home Medications Prior to Admission medications   Medication Sig Start Date End Date Taking? Authorizing Provider  amLODipine (NORVASC) 5 MG tablet TAKE ONE TABLET BY MOUTH DAILY Patient taking differently: Take 5 mg by mouth daily. 04/04/21  Yes [name], MD  aspirin 81 MG chewable tablet Chew 81 mg by mouth daily.   Yes [provider]  atorvastatin (LIPITOR) 20 MG tablet TAKE ONE TABLET BY MOUTH DAILY AT 6PM Patient taking differently: Take 20 mg by mouth daily at 6 PM. 12/26/20  Yes Eniola, Phill Myron, MD  carvedilol (COREG) 25 MG tablet TAKE ONE TABLET BY MOUTH TWICE A DAY WITH MEALS Patient taking differently: Take 25 mg by mouth 2 (two) times daily with a meal. 04/09/21  Yes Eniola, Kehinde T, MD  empagliflozin (JARDIANCE) 10 MG TABS tablet Take 1 tablet (10 mg total) by mouth daily. 08/04/20  Yes Andrena Mews T, MD  insulin glargine (LANTUS) 100 UNIT/ML injection Inject 0.2 mLs (20 Units total) into the skin daily. Patient taking differently: Inject 20 Units into the skin daily before breakfast. 10/25/20  Yes [name], MD  isosorbide mononitrate (IMDUR) 60 MG 24 hr tablet Take 1.5 tablets (90 mg total) by mouth daily. 06/04/21  Yes Weaver, Scott T, PA-C  lisinopril-hydrochlorothiazide (ZESTORETIC) 20-12.5 MG tablet TAKE TWO TABLETS BY MOUTH DAILY Patient taking differently: Take 1 tablet by mouth 2 (two) times daily. 04/09/21  Yes [name], MD  loratadine (CLARITIN) 10 MG tablet Take 10 mg by mouth daily as needed for allergies.   Yes [provider]  metFORMIN (GLUCOPHAGE-XR) 500 MG 24 hr tablet TAKE TWO TABLETS BY MOUTH TWICE A DAY Patient taking differently: Take 1,000 mg by mouth 2 (two) times daily. 11/23/20   Yes [name], MD  Multiple Vitamin (MULTIVITAMIN WITH MINERALS) TABS tablet Take 1 tablet by mouth daily. Reported on 04/02/2016   Yes [provider]  nitroGLYCERIN (NITROSTAT) 0.4 MG SL tablet Place 1 tablet (0.4 mg total) under the tongue every 5 (five) minutes as needed for chest pain. 11/24/20  Yes Josue Hector, MD  Accu-Chek Softclix Lancets lancets Use to check capillary glucose TID 03/20/20   [name], MD  Blood Glucose Monitoring Suppl (ACCU-CHEK GUIDE) w/Device KIT  USE AS DIRECTED IN THE MORNING, AT NOON AND AT BEDTIME Patient taking differently: in the morning, at noon, and at bedtime. 07/26/20   [name], MD  glucose blood (ACCU-CHEK GUIDE) test strip Use to check capillary glucose TID 03/20/20   [name], MD  RELION INSULIN SYRINGE 31G X 15/64" 1 ML MISC USE ONCE DAILY AS DIRECTED 03/30/21   Lenoria Chime, MD    Allergies    Codeine, Sulfamethoxazole-trimethoprim, and Tramadol  Review of Systems   Review of Systems 10 Systems reviewed and negative except as per HPI Physical Exam Updated Vital Signs BP 137/72   Pulse 64   Temp 98.6 F (37 C) (Oral)   Resp 20   LMP 11/24/1992   SpO2 100%   Physical Exam Constitutional:      Appearance: Normal appearance.  HENT:     Mouth/Throat:     Pharynx: Oropharynx is clear.  Eyes:     Extraocular Movements: Extraocular movements intact.  Cardiovascular:     Rate and Rhythm: Normal rate and regular rhythm.  Pulmonary:     Effort: Pulmonary effort is normal.     Breath sounds: Normal breath sounds.     Comments: Chest wall nontender.  No rashes or lesions Abdominal:     General: There is no distension.     Palpations: Abdomen is soft.     Tenderness: There is no abdominal tenderness. There is no guarding.  Musculoskeletal:        General: No swelling or tenderness. Normal range of motion.     Right lower leg: No edema.     Left lower leg: No edema.     Comments: Patient reports  area of discomfort was over her left posterior thoracic back.  As I palpate she indicates around the scapula down the paraspinous area to about T10 and then a little bit laterally.  She reports now is not really very tender to palpation  Skin:    General: Skin is warm and dry.  Neurological:     General: No focal deficit present.     Mental Status: She is alert and oriented to person, place, and time.     Motor: No weakness.     Coordination: Coordination normal.  Psychiatric:        Mood and Affect: Mood normal.    ED Results / Procedures / Treatments   Labs (all labs ordered are listed, but only abnormal results are displayed) Labs Reviewed  CBC WITH DIFFERENTIAL/PLATELET - Abnormal; Notable for the following components:      Result Value   RBC 3.70 (*)    Hemoglobin 10.3 (*)    HCT 33.8 (*)    Platelets 100 (*)    All other components within normal limits  COMPREHENSIVE METABOLIC PANEL - Abnormal; Notable for the following components:   Glucose, Bld 289 (*)    Creatinine, Ser 1.22 (*)    GFR, Estimated 45 (*)    All other components within normal limits  TROPONIN I (HIGH SENSITIVITY)  TROPONIN I (HIGH SENSITIVITY)    EKG EKG Interpretation  Date/Time:  Wednesday June 20 2021 11:45:53 EDT Ventricular Rate:  68 PR Interval:  168 QRS Duration: 74 QT Interval:  374 QTC Calculation: 397 R Axis:   33 Text Interpretation: Normal sinus rhythm T wave abnormality, consider inferior ischemia T wave abnormality, consider anterior ischemia Abnormal ECG no change from previous Confirmed by Charlesetta Shanks [phone]) on 06/20/2021 3:28:58 PM  Radiology DG  Chest 1 View  Result Date: 06/20/2021 CLINICAL DATA:  Left-sided chest pain EXAM: CHEST  1 VIEW COMPARISON:  04/18/2020 FINDINGS: The heart size and mediastinal contours are within normal limits. Both lungs are clear. The visualized skeletal structures are unremarkable. IMPRESSION: No active disease. Electronically Signed   By: Inez Catalina M.D.   On: 06/20/2021 12:15    Procedures Procedures   Medications Ordered in ED Medications - No data to display  ED Course  I have reviewed the triage vital signs and the nursing notes.  Pertinent labs & imaging results that were available during my care of the patient were reviewed by me and considered in my medical decision making (see chart for details).    MDM Rules/Calculators/A&P                          Patient has cardiac history.  She reports she is got pain in her thoracic back.  Its around the paraspinous muscle bodies and subscapularis she has no associated symptoms.  Patient is clinically well in appearance.  Pain is now resolved.  Vital signs are stable.  No hypertension.  2 sets of troponins negative.  EKG unchanged from previous.  At this time, I have higher suspicion for musculoskeletal back pain.  Due to patient's age and medical history however I recommend close follow-up with PCP for recheck.  Strict return precautions have been reviewed. Final Clinical Impression(s) / ED Diagnoses Final diagnoses:  Acute left-sided thoracic back pain    Rx / DC Orders ED Discharge Orders     None        Charlesetta Shanks, MD 06/20/21 1530

## 2021-07-03 ENCOUNTER — Other Ambulatory Visit: Payer: Self-pay

## 2021-07-03 ENCOUNTER — Encounter: Payer: Self-pay | Admitting: Family Medicine

## 2021-07-03 ENCOUNTER — Ambulatory Visit: Payer: Medicare PPO | Admitting: Family Medicine

## 2021-07-03 VITALS — BP 140/50 | HR 73 | Ht 62.0 in | Wt 144.0 lb

## 2021-07-03 DIAGNOSIS — I2510 Atherosclerotic heart disease of native coronary artery without angina pectoris: Secondary | ICD-10-CM | POA: Diagnosis not present

## 2021-07-03 DIAGNOSIS — I10 Essential (primary) hypertension: Secondary | ICD-10-CM

## 2021-07-03 DIAGNOSIS — E041 Nontoxic single thyroid nodule: Secondary | ICD-10-CM | POA: Diagnosis not present

## 2021-07-03 DIAGNOSIS — E1159 Type 2 diabetes mellitus with other circulatory complications: Secondary | ICD-10-CM | POA: Diagnosis not present

## 2021-07-03 DIAGNOSIS — L720 Epidermal cyst: Secondary | ICD-10-CM | POA: Diagnosis not present

## 2021-07-03 DIAGNOSIS — I700 Atherosclerosis of aorta: Secondary | ICD-10-CM

## 2021-07-03 DIAGNOSIS — Z85850 Personal history of malignant neoplasm of thyroid: Secondary | ICD-10-CM

## 2021-07-03 LAB — POCT GLYCOSYLATED HEMOGLOBIN (HGB A1C): HbA1c, POC (controlled diabetic range): 7 % (ref 0.0–7.0)

## 2021-07-03 NOTE — Patient Instructions (Signed)
Thyroid Nodule  A thyroid nodule is an isolated growth of thyroid cells that forms a lump in your thyroid gland. The thyroid gland is a butterfly-shaped gland. It is found in the lower front of your neck. This gland sends chemical messengers (hormones) through your blood to all parts of your body. These hormones are important inregulating your body temperature and helping your body to use energy. Thyroid nodules are common. Most are not cancerous (benign). You may have one nodule or several nodules. Different types of thyroid nodules include nodules that: Grow and fill with fluid (thyroid cysts). Produce too much thyroid hormone (hot nodules or hyperthyroid). Produce no thyroid hormone (cold nodules or hypothyroid). Form from cancer cells (thyroid cancers). What are the causes? In most cases, the cause of this condition is not known. What increases the risk? The following factors may make you more likely to develop this condition. Age. Thyroid nodules become more common in people who are older than 79 years of age. Gender. Benign thyroid nodules are more common in women. Cancerous (malignant) thyroid nodules are more common in men. A family history that includes: Thyroid nodules. Pheochromocytoma. Thyroid carcinoma. Hyperparathyroidism. Certain kinds of thyroid diseases, such as Hashimoto's thyroiditis. Lack of iodine in your diet. A history of head and neck radiation, such as from previous cancer treatment. What are the signs or symptoms? In many cases, there are no symptoms. If you have symptoms, they may include: A lump in your lower neck. Feeling a lump or tickle in your throat. Pain in your neck, jaw, or ear. Having trouble swallowing. Hot nodules may cause symptoms that include: Weight loss. Warm, flushed skin. Feeling hot. Feeling nervous. A racing heartbeat. Cold nodules may cause symptoms that include: Weight gain. Dry skin. Brittle hair. This may also occur with hair  loss. Feeling cold. Fatigue. Thyroid cancer nodules may cause symptoms that include: Hard nodules that feel stuck to the thyroid gland. Hoarseness. Lumps in the glands near your thyroid (lymph nodes). How is this diagnosed? A thyroid nodule may be felt by your health care provider during a physical exam. This condition may also be diagnosed based on your symptoms. You may also have tests, including: An ultrasound. This may be done to confirm the diagnosis. A biopsy. This involves taking a sample from the nodule and looking at it under a microscope. Blood tests to make sure that your thyroid is working properly. A thyroid scan. This test uses a radioactive tracer injected into a vein to create an image of the thyroid gland on a computer screen. Imaging tests such as MRI or CT scan. These may be done if: Your nodule is large. Your nodule is blocking your airway. Cancer is suspected. How is this treated? Treatment depends on the cause and size of your nodule or nodules. If the nodule is benign, treatment may not be necessary. Your health care provider may monitor the nodule to see if it goes away without treatment. If the nodule continues to grow, is cancerous, or does not go away, treatment may be needed. Treatment may include: Having a cystic nodule drained with a needle. Ablation therapy. In this treatment, alcohol is injected into the area of the nodule to destroy the cells. Ablation with heat (thermal ablation) may also be used. Radioactive iodine. In this treatment, radioactive iodine is given as a pill or liquid that you drink. This substance causes the thyroid nodule to shrink. Surgery to remove the nodule. Part or all of your thyroid gland may  need to be removed as well. Medicines. Follow these instructions at home: Pay attention to any changes in your nodule. Take over-the-counter and prescription medicines only as told by your health care provider. Keep all follow-up visits as told  by your health care provider. This is important. Contact a health care provider if: Your voice changes. You have trouble swallowing. You have pain in your neck, ear, or jaw that is getting worse. Your nodule gets bigger. Your nodule starts to make it harder for you to breathe. Your muscles look like they are shrinking (muscle wasting). Get help right away if: You have chest pain. There is a loss of consciousness. You have a sudden fever. You feel confused. You are seeing or hearing things that other people do not see or hear (having hallucinations). You feel very weak. You have mood swings. You feel very restless. You feel suddenly nauseous or throw up. You suddenly have diarrhea. Summary A thyroid nodule is an isolated growth of thyroid cells that forms a lump in your thyroid gland. Thyroid nodules are common. Most are not cancerous (benign). You may have one nodule or several nodules. Treatment depends on the cause and size of your nodule or nodules. If the nodule is benign, treatment may not be necessary. Your health care provider may monitor the nodule to see if it goes away without treatment. If the nodule continues to grow, is cancerous, or does not go away, treatment may be needed. This information is not intended to replace advice given to you by your health care provider. Make sure you discuss any questions you have with your healthcare provider. Document Revised: 07/24/2018 Document Reviewed: 07/27/2018 Elsevier Patient Education  [employer] [place].

## 2021-07-03 NOTE — Assessment & Plan Note (Signed)
Stable on ASA and Statin.

## 2021-07-03 NOTE — Assessment & Plan Note (Addendum)
Diastolic low but stable on her current BP regimen with no symptoms.

## 2021-07-03 NOTE — Assessment & Plan Note (Signed)
A1C is at goal.

## 2021-07-03 NOTE — Assessment & Plan Note (Signed)
Continue ASA and Statin.

## 2021-07-03 NOTE — Progress Notes (Signed)
     SUBJECTIVE:   CHIEF COMPLAINT / HPI:   Shoulder/back pain: A recent visit to the ED for back and shoulder pain. Here for f/u. Denies any pain today.  Skin cyst: Cyst shrunk in size with no pain. She is here for f/u.  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 Per the patient, she was cleared by endo from monitoring. Recent CT chest suggested stable thyroid nodule. She denies any concerns today.  DM2:  She is compliant with Metformin 500 mg (2 tabs BID), Lantus 20 units Qd and Jardiance 10 mg QD. Her AM fasting CBG today was 131. No other concerns.  HTN/CAD: Compliant with meds. No concerns     PERTINENT  PMH / PSH: PMX reviewed  OBJECTIVE:   Vitals:   07/03/21 1038  BP: (!) 140/50  Pulse: 73  SpO2: 98%  Weight: 144 lb (65.3 kg)  Height: 5\' 2"  (1.575 m)    Physical Exam Vitals and nursing note reviewed.  Neck:     Comments: Mildly palpable left thyroid gland. No obvious mass. Cardiovascular:     Rate and Rhythm: Normal rate and regular rhythm.     Pulses: Normal pulses.     Heart sounds: Normal heart sounds. No murmur heard. Pulmonary:     Effort: Pulmonary effort is normal. No respiratory distress.     Breath sounds: Normal breath sounds. No wheezing.  Abdominal:     General: Bowel sounds are normal. There is no distension.     Palpations: Abdomen is soft. There is no mass.     Tenderness: There is no abdominal tenderness.  Musculoskeletal:     Thoracic back: Normal.     Lumbar back: Normal.     Right lower leg: No edema.     Left lower leg: No edema.     Comments: Inclusion cyst on left upper back improved See image.  Neurological:     General: No focal deficit present.      ASSESSMENT/PLAN:  Back and shoulder pain resolved.  Resistant hypertension Diastolic low but stable on her current BP regimen with no symptoms.   Inclusion cyst Improved. May return for removal when she decides. She feels it is much  better now. F/U as needed.   Left thyroid nodule Has not had recent endo visit. Discussed [place] today to assess, which she agreed upon. If abnormal, will refer to endo. She agreed with the plan.  Diabetes mellitus type 2, controlled ([place]) A1C is at goal.   Aortic arch atherosclerosis (HCC) Continue ASA and Statin.    Michele Mews, MD [place]

## 2021-07-03 NOTE — Assessment & Plan Note (Signed)
Has not had recent endo visit. Discussed [place] today to assess, which she agreed upon. If abnormal, will refer to endo. She agreed with the plan.

## 2021-07-03 NOTE — Assessment & Plan Note (Signed)
Improved. May return for removal when she decides. She feels it is much better now. F/U as needed.

## 2021-07-11 ENCOUNTER — Ambulatory Visit (HOSPITAL_COMMUNITY)
Admission: RE | Admit: 2021-07-11 | Discharge: 2021-07-11 | Disposition: A | Discharge status: Home / Self Care | Payer: Medicare PPO | Source: Ambulatory Visit | Attending: Family Medicine | Admitting: Family Medicine

## 2021-07-11 ENCOUNTER — Other Ambulatory Visit: Payer: Self-pay

## 2021-07-11 DIAGNOSIS — Z85850 Personal history of malignant neoplasm of thyroid: Secondary | ICD-10-CM | POA: Diagnosis not present

## 2021-07-11 DIAGNOSIS — C73 Malignant neoplasm of thyroid gland: Secondary | ICD-10-CM | POA: Diagnosis not present

## 2021-07-11 DIAGNOSIS — E041 Nontoxic single thyroid nodule: Secondary | ICD-10-CM | POA: Diagnosis not present

## 2021-07-11 DIAGNOSIS — E890 Postprocedural hypothyroidism: Secondary | ICD-10-CM | POA: Diagnosis not present

## 2021-07-12 ENCOUNTER — Telehealth: Payer: Self-pay | Admitting: Family Medicine

## 2021-07-12 NOTE — Telephone Encounter (Signed)
HIPAA compliant callback message left.   Note:  Her Thyroid US remains unchanged. No further monitoring for now.

## 2021-07-25 ENCOUNTER — Other Ambulatory Visit: Payer: Self-pay | Admitting: Family Medicine

## 2021-08-01 ENCOUNTER — Other Ambulatory Visit: Payer: Self-pay | Admitting: Family Medicine

## 2021-08-03 ENCOUNTER — Other Ambulatory Visit: Payer: Self-pay

## 2021-08-03 ENCOUNTER — Inpatient Hospital Stay: Payer: Medicare PPO | Attending: Hematology and Oncology

## 2021-08-03 ENCOUNTER — Telehealth: Payer: Self-pay

## 2021-08-03 DIAGNOSIS — D638 Anemia in other chronic diseases classified elsewhere: Secondary | ICD-10-CM

## 2021-08-03 DIAGNOSIS — N189 Chronic kidney disease, unspecified: Secondary | ICD-10-CM | POA: Diagnosis not present

## 2021-08-03 DIAGNOSIS — D649 Anemia, unspecified: Secondary | ICD-10-CM | POA: Diagnosis not present

## 2021-08-03 LAB — CMP (CANCER CENTER ONLY)
ALT: 13 U/L (ref 0–44)
AST: 16 U/L (ref 15–41)
Albumin: 3.8 g/dL (ref 3.5–5.0)
Alkaline Phosphatase: 67 U/L (ref 38–126)
Anion gap: 9 (ref 5–15)
BUN: 20 mg/dL (ref 8–23)
CO2: 30 mmol/L (ref 22–32)
Calcium: 10.1 mg/dL (ref 8.9–10.3)
Chloride: 102 mmol/L (ref 98–111)
Creatinine: 1.38 mg/dL — ABNORMAL HIGH (ref 0.44–1.00)
GFR, Estimated: 39 mL/min — ABNORMAL LOW (ref >60)
Glucose, Bld: 111 mg/dL — ABNORMAL HIGH (ref 70–99)
Potassium: 4.2 mmol/L (ref 3.5–5.1)
Sodium: 141 mmol/L (ref 135–145)
Total Bilirubin: 0.6 mg/dL (ref 0.3–1.2)
Total Protein: 7.2 g/dL (ref 6.5–8.1)

## 2021-08-03 LAB — CBC WITH DIFFERENTIAL (CANCER CENTER ONLY)
Abs Immature Granulocytes: 0.01 10*3/uL (ref 0.00–0.07)
Basophils Absolute: 0.1 10*3/uL (ref 0.0–0.1)
Basophils Relative: 1 %
Eosinophils Absolute: 0.1 10*3/uL (ref 0.0–0.5)
Eosinophils Relative: 2 %
HCT: 31.5 % — ABNORMAL LOW (ref 36.0–46.0)
Hemoglobin: 9.6 g/dL — ABNORMAL LOW (ref 12.0–15.0)
Immature Granulocytes: 0 %
Lymphocytes Relative: 41 %
Lymphs Abs: 2.3 10*3/uL (ref 0.7–4.0)
MCH: 27.7 pg (ref 26.0–34.0)
MCHC: 30.5 g/dL (ref 30.0–36.0)
MCV: 91 fL (ref 80.0–100.0)
Monocytes Absolute: 0.6 10*3/uL (ref 0.1–1.0)
Monocytes Relative: 10 %
Neutro Abs: 2.6 10*3/uL (ref 1.7–7.7)
Neutrophils Relative %: 46 %
Platelet Count: 152 10*3/uL (ref 150–400)
RBC: 3.46 MIL/uL — ABNORMAL LOW (ref 3.87–5.11)
RDW: 16.2 % — ABNORMAL HIGH (ref 11.5–15.5)
WBC Count: 5.6 10*3/uL (ref 4.0–10.5)
nRBC: 0 % (ref 0.0–0.2)

## 2021-08-03 LAB — IRON AND TIBC
Iron: 92 ug/dL (ref 41–142)
Saturation Ratios: 26 % (ref 21–57)
TIBC: 348 ug/dL (ref 236–444)
UIBC: 256 ug/dL (ref 120–384)

## 2021-08-03 LAB — FERRITIN: Ferritin: 10 ng/mL — ABNORMAL LOW (ref 11–307)

## 2021-08-03 NOTE — Telephone Encounter (Signed)
LVM to have pt call back to schedule AWV.   RE: confirm insurance and schedule AWV on my schedule if times are convenient for patient or other AWV schedule as template permits.   

## 2021-08-07 NOTE — Assessment & Plan Note (Signed)
January 19, 2019 hemoglobin 9.3 platelets 126,000.  February 09, 2019 hemoglobin 9.6 platelets 132,000Ferritin 23. Folate was normal B12 was normal. The patient reports history of diverticulitis.andhemorrhoids. Last colonoscopy was 07/02/2013 and showed diverticuli as well as hemorrhoids. 04/29/2019: Hemoglobin 9.4, ferritin 216  Work-up did not reveal thalassemia. IV iron treatment: March 2020  Lab review 04/14/2020: Hemoglobin 9.9, platelets 131, MCV 89.7, B12 527, iron saturation 26%, ferritin 18, creatinine 1.04, calcium 99991111, folic acid 0000000 0000000: SPEP: No M protein, kappa 32.4, ratio 1.43 erythropoietin 68.1  08/07/2020: Hemoglobin 9.4, MCV 87.9, platelets 139, creatinine 1.11, calcium 10.6, ferritin 30, iron saturation 23% 08/03/21: Hb 9.6, MCV: 91, Cr: 1.38, Ferritin:10, Iron Sat: 26%,   Possible cause is anemia of chronic kidney disease.  Given the fact that the hemoglobin continues to remain stable and she is not symptomatic we will continue to watch and monitor.  Recheck labs in 6 months and follow-up.

## 2021-08-07 NOTE — Progress Notes (Signed)
Patient Care Team: [name], MD as PCP - General (Family Medicine) Josue Hector, MD as PCP - Cardiology (Cardiology) Lazaro Arms, RN as Case Manager  DIAGNOSIS:    ICD-10-CM   1. Anemia of chronic disease  D63.8 CBC with Differential (Cancer Center Only)    CMP ([place] only)    Iron and TIBC    Ferritin       CHIEF COMPLIANT: Follow-up of anemia  INTERVAL HISTORY: [name] is a 80 y.o. with above-mentioned history of anemia previously treated with oral and IV iron. Labs on 08/03/21 showed: Hg 9.6, HCT 31.5, platelets 152, iron saturation 26%, ferritin 10. She presents to the clinic today for follow-up.  She does not report any symptoms of fatigue.  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  She has plenty of energy.  Denies any shortness of breath or lightheadedness or dizziness.  Denies any cravings for ice chips.  ALLERGIES:  is allergic to codeine, sulfamethoxazole-trimethoprim, and tramadol.  MEDICATIONS:  Current Outpatient Medications  Medication Sig Dispense Refill   Insulin Syringe-Needle U-100 (RELION INSULIN SYRINGE) 31G X 15/64" 1 ML MISC USE  AS DIRECTED TID FOR CBG CHECK 100 each 5   Accu-Chek FastClix Lancets MISC USE ONE LANCET TO TEST THREE TIMES A DAY 102 each 4   amLODipine (NORVASC) 5 MG tablet TAKE ONE TABLET BY MOUTH DAILY (Patient taking differently: Take 5 mg by mouth daily.) 90 tablet 2   aspirin 81 MG chewable tablet Chew 81 mg by mouth daily.     atorvastatin (LIPITOR) 20 MG tablet TAKE ONE TABLET BY MOUTH DAILY AT 6PM (Patient taking differently: Take 20 mg by mouth daily at 6 PM.) 90 tablet 2   Blood Glucose Monitoring Suppl (ACCU-CHEK GUIDE) w/Device KIT USE AS DIRECTED IN THE MORNING, AT NOON AND AT BEDTIME (Patient taking differently: in the morning, at noon, and at bedtime.) 1 kit 0   carvedilol (COREG) 25 MG tablet TAKE ONE TABLET BY MOUTH TWICE A DAY WITH MEALS (Patient taking differently:  Take 25 mg by mouth 2 (two) times daily with a meal.) 180 tablet 1   empagliflozin (JARDIANCE) 10 MG TABS tablet Take 1 tablet (10 mg total) by mouth daily. 90 tablet 3   glucose blood (ACCU-CHEK GUIDE) test strip USE ONE STRIP TO TEST THREE TIMES A DAY 100 strip 5   insulin glargine (LANTUS) 100 UNIT/ML injection Inject 0.2 mLs (20 Units total) into the skin daily. (Patient taking differently: Inject 20 Units into the skin daily before breakfast.) 10 mL 4   isosorbide mononitrate (IMDUR) 60 MG 24 hr tablet Take 1.5 tablets (90 mg total) by mouth daily. 135 tablet 2   lisinopril-hydrochlorothiazide (ZESTORETIC) 20-12.5 MG tablet TAKE TWO TABLETS BY MOUTH DAILY (Patient taking differently: Take 1 tablet by mouth 2 (two) times daily.) 180 tablet 1   loratadine (CLARITIN) 10 MG tablet Take 10 mg by mouth daily as needed for allergies. (Patient not taking: Reported on 07/03/2021)     metFORMIN (GLUCOPHAGE-XR) 500 MG 24 hr tablet TAKE TWO TABLETS BY MOUTH TWICE A DAY (Patient taking differently: Take 1,000 mg by mouth 2 (two) times daily.) 360 tablet 3   Multiple Vitamin (MULTIVITAMIN WITH MINERALS) TABS tablet Take 1 tablet by mouth daily. Reported on 04/02/2016     nitroGLYCERIN (NITROSTAT) 0.4 MG SL tablet Place 1 tablet (0.4 mg total) under the tongue every 5 (five) minutes as needed for chest  pain. (Patient not taking: Reported on 07/03/2021) 25 tablet 3   No current facility-administered medications for this visit.    PHYSICAL EXAMINATION: ECOG PERFORMANCE STATUS: 1 - Symptomatic but completely ambulatory  Vitals:   08/08/21 0942  BP: (!) 176/59  Pulse: 78  Resp: 18  Temp: 97.8 F (36.6 C)  SpO2: 100%   Filed Weights   08/08/21 0942  Weight: 144 lb 4.8 oz (65.5 kg)    LABORATORY DATA:  I have reviewed the data as listed CMP Latest Ref Rng & Units 08/03/2021 06/20/2021 08/07/2020  Glucose 70 - 99 mg/dL 111(H) 289(H) 148(H)  BUN 8 - 23 mg/dL _0 Creatinine 0.44 - 1.00 mg/dL 1.38(H)  1.22(H) 1.11(H)  Sodium 135 - 145 mmol/L 141 139 142  Potassium 3.5 - 5.1 mmol/L 4.2 4.4 4.1  Chloride 98 - 111 mmol/L 102 104 105  CO2 22 - 32 mmol/L _1 Calcium 8.9 - 10.3 mg/dL 10.1 9.5 10.6(H)  Total Protein 6.5 - 8.1 g/dL 7.2 6.8 7.6  Total Bilirubin 0.3 - 1.2 mg/dL 0.6 0.5 0.7  Alkaline Phos 38 - 126 U/L 67 70 91  AST 15 - 41 U/L 16 20 13(L)  ALT 0 - 44 U/L _2 Lab Results  Component Value Date   WBC 5.6 08/03/2021   HGB 9.6 (L) 08/03/2021   HCT 31.5 (L) 08/03/2021   MCV 91.0 08/03/2021   PLT 152 08/03/2021   NEUTROABS 2.6 08/03/2021    ASSESSMENT & PLAN:  Anemia of chronic disease January 19, 2019 hemoglobin 9.3 platelets 126,000.   February 09, 2019 hemoglobin 9.6 platelets 132,000 Ferritin 23.  Folate was normal B12 was normal.  The patient reports history of diverticulitis.and  hemorrhoids.  Last colonoscopy was 07/02/2013 and showed diverticuli as well as hemorrhoids. 04/29/2019: Hemoglobin 9.4, ferritin 216   Work-up did not reveal thalassemia. IV iron treatment: March 2020   Lab review 04/14/2020: Hemoglobin 9.9, platelets 131, MCV 89.7, B12 527, iron saturation 26%, ferritin 18, creatinine 1.04, calcium 95.1, folic acid 88.4  1/66/0630: SPEP: No M protein, kappa 32.4, ratio 1.43 erythropoietin 68.1  08/07/2020: Hemoglobin 9.4, MCV 87.9, platelets 139, creatinine 1.11, calcium 10.6, ferritin 30, iron saturation 23% 08/03/21: Hb 9.6, MCV: 91, Cr: 1.38, Ferritin:10, Iron Sat: 26%,    Possible cause is anemia of chronic kidney disease.   Given the fact that the hemoglobin continues to remain stable and she is not symptomatic we will continue to watch and monitor. I instructed her to take over-the-counter iron supplement.   Recheck labs in 1 year and follow-up.      Orders Placed This Encounter  Procedures   CBC with Differential ([facility] Only)    Standing Status:   Future    Standing Expiration Date:   08/08/2022   CMP ([place] only)     Standing Status:   Future    Standing Expiration Date:   08/08/2022   Iron and TIBC    Standing Status:   Future    Standing Expiration Date:   08/08/2022   Ferritin    Standing Status:   Future    Standing Expiration Date:   08/08/2022    The patient has a good understanding of the overall plan. she agrees with it. she will call with any problems that may develop before the next visit here.  Total time spent: 20 mins including face to face time and time spent for planning, charting and  coordination of care  Rulon Eisenmenger, MD, MPH 08/08/2021  I, Thana Ates, am acting as scribe for Dr. Nicholas Lose.  I have reviewed the above documentation for accuracy and completeness, and I agree with the above.

## 2021-08-08 ENCOUNTER — Other Ambulatory Visit: Payer: Self-pay

## 2021-08-08 ENCOUNTER — Inpatient Hospital Stay: Payer: Medicare PPO | Admitting: Hematology and Oncology

## 2021-08-08 DIAGNOSIS — Z79899 Other long term (current) drug therapy: Secondary | ICD-10-CM

## 2021-08-08 DIAGNOSIS — N189 Chronic kidney disease, unspecified: Secondary | ICD-10-CM | POA: Diagnosis not present

## 2021-08-08 DIAGNOSIS — N1831 Chronic kidney disease, stage 3a: Secondary | ICD-10-CM

## 2021-08-08 DIAGNOSIS — D649 Anemia, unspecified: Secondary | ICD-10-CM | POA: Diagnosis not present

## 2021-08-08 DIAGNOSIS — D638 Anemia in other chronic diseases classified elsewhere: Secondary | ICD-10-CM

## 2021-08-17 ENCOUNTER — Other Ambulatory Visit: Payer: Self-pay | Admitting: Family Medicine

## 2021-08-24 ENCOUNTER — Telehealth: Payer: Medicare PPO

## 2021-08-24 ENCOUNTER — Telehealth: Payer: Self-pay

## 2021-08-24 NOTE — Telephone Encounter (Signed)
   RN Case Manager Care Management   Phone Outreach    08/24/2021 Name: [name] MRN: [id number] DOB: 06-22-1941  [name] is a 80 y.o. year old female who is a primary care patient of En[place], Phill Myron, MD .   Telephone outreach was unsuccessful A HIPPA compliant phone message was left for the patient providing contact information and requesting a return call.   Plan:Will route chart to Care Guide to see if patient would like to reschedule phone appointment   Review of patient status, including review of consultants reports, relevant laboratory and other test results, and collaboration with appropriate care team members and the patient's provider was performed as part of comprehensive patient evaluation and provision of care management services.    Lazaro Arms RN, BSN, [facility] Care Management Coordinator [place] Phone: [phone] I Fax: [phone]

## 2021-08-30 ENCOUNTER — Other Ambulatory Visit: Payer: Self-pay | Admitting: Family Medicine

## 2021-09-14 ENCOUNTER — Telehealth: Payer: Self-pay | Admitting: *Deleted

## 2021-09-14 NOTE — Chronic Care Management (AMB) (Signed)
  Care Management   Note  09/14/2021 Name: [name] MRN: [id number] DOB: [birth year]  [name] is a 80 y.o. year old female who is a primary care patient of [name], MD and is actively engaged with the care management team. I reached out to [name] by phone today to assist with re-scheduling a follow up visit with the RN Case Manager  Follow up plan: Unsuccessful telephone outreach attempt made. A HIPAA compliant phone message was left for the patient providing contact information and requesting a return call.  The care management team will reach out to the patient again over the next 7 days.  If patient returns call to provider office, please advise to call [facility] at [phone].  [place] Management  Direct Dial: [phone]

## 2021-09-17 NOTE — Chronic Care Management (AMB) (Signed)
  Care Management   Note  09/17/2021 Name: [name] MRN: [id number] DOB: [birth year]  [name] is a 80 y.o. year old female who is a primary care patient of [name], MD and is actively engaged with the care management team. I reached out to [name] by phone today to assist with re-scheduling a follow up visit with the RN Case Manager  Follow up plan: Telephone appointment with care management team member scheduled for:09/20/21  Enrica Corliss  Care Guide, [place] Management  Direct Dial: [phone]

## 2021-09-19 ENCOUNTER — Ambulatory Visit (INDEPENDENT_AMBULATORY_CARE_PROVIDER_SITE_OTHER): Payer: Medicare PPO | Admitting: Family Medicine

## 2021-09-19 ENCOUNTER — Other Ambulatory Visit: Payer: Self-pay

## 2021-09-19 ENCOUNTER — Encounter: Payer: Self-pay | Admitting: Family Medicine

## 2021-09-19 DIAGNOSIS — N649 Disorder of breast, unspecified: Secondary | ICD-10-CM

## 2021-09-19 DIAGNOSIS — L988 Other specified disorders of the skin and subcutaneous tissue: Secondary | ICD-10-CM

## 2021-09-19 MED ORDER — TRIAMCINOLONE ACETONIDE 0.5 % EX OINT: 1.0000 "application " | TOPICAL_OINTMENT | Freq: Two times a day (BID) | CUTANEOUS | 0 refills | Status: DC

## 2021-09-19 NOTE — Patient Instructions (Signed)
Thank you for coming to see me today. It was a pleasure. Today we discussed the lesion on the left breast. It could be bug bite/allergic reaction. I recommend steroid cream to apply to the affected area twice a day.   Please follow-up with your PCP if no improvement in symptoms.   Come back for flu shot Get shingles shot at pharmacy  If you have any questions or concerns, please do not hesitate to call the office at [phone].  Best wishes,   Dr Posey Pronto

## 2021-09-19 NOTE — Progress Notes (Signed)
     SUBJECTIVE:   CHIEF COMPLAINT / HPI:   [name] is a 80 y.o. female presents for lesion over breast  Breast lesion Pt reports she was bitten by something 1 week ago over the left breast. She did not see what it was. Noticed an erythematous, tenderness lesion of left breast. No discharge or fluid leakage. Denies travel abroad, fevers, hiking, changes in laundry detergent, medication changes. Pt does not have pet. No other lesions elsewhere on the body.   [facility] Office Visit from 07/03/2021 in [facility]  PHQ-9 Total Score 1       PERTINENT  PMH / PSH: CKD, thrombocytopenia   OBJECTIVE:   BP 130/60   Wt 142 lb (64.4 kg)   LMP 11/24/1992   BMI 25.97 kg/m    General: Alert, no acute distress Cardio: well perfused Pulm: normal work of breathing Neuro: Cranial nerves grossly intact       ASSESSMENT/PLAN:   Skin lesion of breast Insect bite Vs contact dermatitis. Abscess unlikely. Prescribed triamcinolone ointment to apply to affected area BID. Follow up in 1-2 weeks if no improvement.    Lattie Haw, MD PGY-3 [place]

## 2021-09-20 ENCOUNTER — Ambulatory Visit: Payer: Medicare PPO

## 2021-09-20 NOTE — Chronic Care Management (AMB) (Signed)
Chronic Care Management   CCM RN Visit Note  09/20/2021 Name: [name] MRN: [id number] DOB: 07-30-1941  Subjective: [name] is a 80 y.o. year old female who is a primary care patient of [name], MD. The care management team was consulted for assistance with disease management and care coordination needs.    Engaged with patient by telephone for follow up visit in response to provider referral for case management and/or care coordination services.   Consent to Services:  The patient was given information about Chronic Care Management services, agreed to services, and gave verbal consent prior to initiation of services.  Please see initial visit note for detailed documentation.   Patient agreed to services and verbal consent obtained.    Assessment:  The patient continues to maintain positive progress with care plan goals.. See Care Plan below for interventions and patient self-care actives. Follow up Plan: Patient would like continued follow-up.  CCM RNCM will outreach the patient within the next 2 months  Patient will call office if needed prior to next encounter  Review of patient past medical history, allergies, medications, health status, including review of consultants reports, laboratory and other test data, was performed as part of comprehensive evaluation and provision of chronic care management services.   SDOH (Social Determinants of Health) assessments and interventions performed:    CCM Care Plan  Allergies  Allergen Reactions   Codeine Nausea And Vomiting   Sulfamethoxazole-Trimethoprim Nausea And Vomiting   Tramadol Nausea And Vomiting    Outpatient Encounter Medications as of 09/20/2021  Medication Sig   Insulin Syringe-Needle U-100 (RELION INSULIN SYRINGE) 31G X 15/64" 1 ML MISC USE  AS DIRECTED TID FOR CBG CHECK   Accu-Chek FastClix Lancets MISC USE ONE LANCET TO TEST THREE TIMES A DAY   amLODipine (NORVASC) 5 MG tablet TAKE ONE TABLET  BY MOUTH DAILY (Patient taking differently: Take 5 mg by mouth daily.)   aspirin 81 MG chewable tablet Chew 81 mg by mouth daily.   atorvastatin (LIPITOR) 20 MG tablet TAKE ONE TABLET BY MOUTH DAILY AT 6PM (Patient taking differently: Take 20 mg by mouth daily at 6 PM.)   Blood Glucose Monitoring Suppl (ACCU-CHEK GUIDE) w/Device KIT USE AS DIRECTED IN THE MORNING, AT NOON AND AT BEDTIME (Patient taking differently: in the morning, at noon, and at bedtime.)   carvedilol (COREG) 25 MG tablet TAKE ONE TABLET BY MOUTH TWICE A DAY WITH MEALS (Patient taking differently: Take 25 mg by mouth 2 (two) times daily with a meal.)   glucose blood (ACCU-CHEK GUIDE) test strip USE ONE STRIP TO TEST THREE TIMES A DAY   isosorbide mononitrate (IMDUR) 60 MG 24 hr tablet Take 1.5 tablets (90 mg total) by mouth daily.   JARDIANCE 10 MG TABS tablet TAKE ONE TABLET BY MOUTH DAILY   LANTUS 100 UNIT/ML injection INJECT 20 UNITS INTO THE SKIN DAILY (HOLD IF BLOOD SUGAR IS LESS THAN 70)   lisinopril-hydrochlorothiazide (ZESTORETIC) 20-12.5 MG tablet TAKE TWO TABLETS BY MOUTH DAILY (Patient taking differently: Take 1 tablet by mouth 2 (two) times daily.)   loratadine (CLARITIN) 10 MG tablet Take 10 mg by mouth daily as needed for allergies. (Patient not taking: Reported on 07/03/2021)   metFORMIN (GLUCOPHAGE-XR) 500 MG 24 hr tablet TAKE TWO TABLETS BY MOUTH TWICE A DAY (Patient taking differently: Take 1,000 mg by mouth 2 (two) times daily.)   Multiple Vitamin (MULTIVITAMIN WITH MINERALS) TABS tablet Take 1 tablet by mouth daily. Reported on  04/02/2016   nitroGLYCERIN (NITROSTAT) 0.4 MG SL tablet Place 1 tablet (0.4 mg total) under the tongue every 5 (five) minutes as needed for chest pain. (Patient not taking: Reported on 07/03/2021)   triamcinolone ointment (KENALOG) 0.5 % Apply 1 application topically 2 (two) times daily.   No facility-administered encounter medications on file as of 09/20/2021.    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 08/29/2017   Nodule of right lung 08/29/2017   Aortic arch atherosclerosis (HCC) 08/29/2017   Liver cyst    Left thyroid nodule 07/27/2014   History of thyroid cancer 04/12/2013   Diabetes mellitus type 2, controlled (HCC) 02/19/2007   HYPERCHOLESTEROLEMIA 02/19/2007   Anemia of chronic disease 02/19/2007   Resistant hypertension 02/19/2007   Coronary artery disease involving native coronary artery of native heart without angina pectoris 02/19/2007    Conditions to be addressed/monitored:HTN and DMII  Care Plan : RN Case Manager  Updates made by Romaldo Saville, RN since 09/20/2021 12:00 AM     Problem: Hypertension and Diabetes      Long-Range Goal: Monitor HTN and DM II   Start Date: 11/17/2019  Expected End Date: 09/21/2021  Recent Progress: On track  Priority: Medium  Note:    Current Barriers:  Chronic Disease Management support, education, and care coordination needs related to HTN and DMII  Clinical Goal(s) related to HTN and DMII:  Over the next 90 days, patient will:  Work with the care management team to address educational, disease management, and care coordination needs  Begin or continue self health monitoring activities as directed today Measure and record cbg (blood glucose) as prescribed times daily, Measure and record blood pressure  Call provider office for new or worsened signs and symptoms  Call care management team with questions or concerns Verbalize basic understanding of patient centered plan of care established today  Interventions related to  HTN and DMII:  Evaluation of current treatment plans and patient's adherence to plan as established by provider Assessed patient understanding of disease states Provided disease specific education to patient  Collaborated with appropriate clinical care team members regarding patient needs Discussed plans with patient for ongoing care management follow up and provided patient with direct contact information for care management team Appointment reviewed: appointment next week with surgeon to review results of biopsy of spot on her tongue healthy lifestyle promoted Patient is eating well but she is not eating a snack at night.  Encouraged Patient to have a small snack  activity based on tolerance and functional limitations encouraged quality of sleep assessed-patient states that she sleeps well pain assessed and managed- patient states that she has no pain HTN reduction of dietary sodium encouraged blood pressure trends reviewed-  home or ambulatory blood pressure monitoring encouraged DM II Provided patient with written and verbal  educational materials related to hypo and hyperglycemia and importance of correct treatment Review of patient status, including review of consultants reports, relevant laboratory and other test results, and medications completed. blood glucose monitoring encouraged blood glucose readings reviewed-  09/20/21:  Mrs. Beedy is doing well today. She continues to   check her BP and diabetes daily and take her medications. Her blood pressure has remained around 130/60. Her blood sugar is ranging 95-98. She found that if she took the second dose of metformin the following day, her blood sugar would be very low. So, she will only take one pill of metformin at night. She does not eat a snack at night because she has no appetite.   Patient Self Care Activities related to HTN and DMII:  check blood sugar at prescribed times take the blood sugar meter to all doctor  visits Patient verbalizes understanding of plan  Self-administers medications as prescribed Calls pharmacy for medication refills Call's provider office for new concerns or questions Attends all scheduled provider appointments Calls provider office for new concerns, questions, or BP outside discussed parameters Checks BP and records as discussed Follows a low sodium diet/DASH diet       Lazaro Arms RN, BSN, [facility] Care Management Coordinator [place] Phone: [phone] I Fax: [phone]

## 2021-09-20 NOTE — Patient Instructions (Signed)
Visit Information  Ms. Jaquess  it was nice speaking with you. Please call me directly [phone] if you have questions about the goals we discussed.   Goals Addressed             This Visit's Progress    Monitor and Manage My Blood Sugar-Diabetes Type 2       Timeframe:  Long-Range Goal Priority:  Medium Start Date:    11/17/19                         Expected End Date:  10/22/21   - check blood sugar at prescribed times - check blood sugar if I feel it is too high or too low - take the blood sugar meter to all doctor visits    Why is this important?   Checking your blood sugar at home helps to keep it from getting very high or very low.  Writing the results in a diary or log helps the doctor know how to care for you.  Your blood sugar log should have the time, date and the results.  Also, write down the amount of insulin or other medicine that you take.  Other information, like what you ate, exercise done and how you were feeling, will also be helpful.     Notes:      Track and Manage My Blood Pressure-Hypertension       Timeframe:  Long-Range Goal Priority:  High Start Date:        11/17/19                     Expected End Date:      10/22/21   - check blood pressure daily - choose a place to take my blood pressure (home, clinic or office, retail store) - write blood pressure results in a log or diary    Why is this important?   You won't feel high blood pressure, but it can still hurt your blood vessels.  High blood pressure can cause heart or kidney problems. It can also cause a stroke.  Making lifestyle changes like losing a little weight or eating less salt will help.  Checking your blood pressure at home and at different times of the day can help to control blood pressure.  If the doctor prescribes medicine remember to take it the way the doctor ordered.  Call the office if you cannot afford the medicine or if there are questions about it.     Notes:        The patient verbalizes understanding of the information and instructions discussed today.  Our next appointment is scheduled for  11/14/21.   Please feel free to call me or the office if you have any questions or concerns.  Lazaro Arms RN, BSN, [facility] Care Management Coordinator [place] Phone: [phone] I Fax: [phone]

## 2021-09-23 DIAGNOSIS — L988 Other specified disorders of the skin and subcutaneous tissue: Secondary | ICD-10-CM | POA: Insufficient documentation

## 2021-09-23 NOTE — Assessment & Plan Note (Signed)
Insect bite Vs contact dermatitis. Abscess unlikely. Prescribed triamcinolone ointment to apply to affected area BID. Follow up in 1-2 weeks if no improvement.

## 2021-11-02 ENCOUNTER — Encounter: Payer: Self-pay | Admitting: Family Medicine

## 2021-11-02 ENCOUNTER — Other Ambulatory Visit: Payer: Self-pay

## 2021-11-02 ENCOUNTER — Ambulatory Visit: Payer: Medicare PPO | Admitting: Family Medicine

## 2021-11-02 VITALS — BP 116/47 | HR 78 | Ht 62.0 in | Wt 140.4 lb

## 2021-11-02 DIAGNOSIS — E1159 Type 2 diabetes mellitus with other circulatory complications: Secondary | ICD-10-CM | POA: Diagnosis not present

## 2021-11-02 DIAGNOSIS — E7800 Pure hypercholesterolemia, unspecified: Secondary | ICD-10-CM | POA: Diagnosis not present

## 2021-11-02 DIAGNOSIS — N1831 Chronic kidney disease, stage 3a: Secondary | ICD-10-CM

## 2021-11-02 DIAGNOSIS — M25512 Pain in left shoulder: Secondary | ICD-10-CM | POA: Diagnosis not present

## 2021-11-02 DIAGNOSIS — Z23 Encounter for immunization: Secondary | ICD-10-CM | POA: Diagnosis not present

## 2021-11-02 DIAGNOSIS — K7689 Other specified diseases of liver: Secondary | ICD-10-CM

## 2021-11-02 DIAGNOSIS — G8929 Other chronic pain: Secondary | ICD-10-CM | POA: Diagnosis not present

## 2021-11-02 DIAGNOSIS — I10 Essential (primary) hypertension: Secondary | ICD-10-CM

## 2021-11-02 DIAGNOSIS — M25519 Pain in unspecified shoulder: Secondary | ICD-10-CM | POA: Insufficient documentation

## 2021-11-02 LAB — POCT GLYCOSYLATED HEMOGLOBIN (HGB A1C): HbA1c, POC (controlled diabetic range): 6.9 % (ref 0.0–7.0)

## 2021-11-02 MED ORDER — ZOSTER VAC RECOMB ADJUVANTED 50 MCG/0.5ML IM SUSR: 0.5000 mL | Freq: Once | INTRAMUSCULAR | 1 refills | Status: AC

## 2021-11-02 MED ORDER — INSULIN GLARGINE 100 UNIT/ML ~~LOC~~ SOLN: 15.0000 [IU] | Freq: Every day | SUBCUTANEOUS | 4 refills | Status: DC

## 2021-11-02 NOTE — Assessment & Plan Note (Addendum)
A1C checked today is below goal at 6.9 I advised Metformin 1000 mg BID as prescribed and reduce Lantus from 20 to 15 units daily. Continue home CBG monitoring. Will discuss addition of GLP1/SGLT2 once her glucose control stabilizes. F/U in 3 months for A1C check. She declined podiatry referral for comprehensive foot exam and management.

## 2021-11-02 NOTE — Progress Notes (Signed)
SUBJECTIVE:   CHIEF COMPLAINT / HPI:   Shoulder Pain  The pain is present in the left shoulder. This is a recurrent (Hx of left shoulder pain with a flare starting 7-9 days ago) problem. There has been no history of extremity trauma. The problem occurs intermittently. The problem has been waxing and waning. The pain is at a severity of 0/10 (No pain now, last episode was 1 week. Wheh she had the pain, it was about 8/10 in severity). Pertinent negatives include no inability to bear weight, joint swelling, numbness or stiffness. The symptoms are aggravated by activity. Treatments tried: Extra strenght Tylenol. The treatment provided significant relief.   DM2/CKD:  She is here for f/u. She cut back on her Metformin to 1000 daily instead of BID due to unusually low blood glucose without hypoglycemic symptoms. Her lowest was 96. Normal is usually around 100 - 150. No other concerns. She is compliant with Lantus 20 units QD and Jardiance 10 mg QD.  HTN/HLD: BP  runs low sometimes at home. Highest of 135/55. Denies any symptoms now. However, endorses dizziness with rapid standing up from a sitting position. Her current BP regimen are - Norvasc 5 mg QD, Coreg 25 mg BID, Zestoretic 20/12.5 mg BID, Imdur 60 mg QD. Lipitor 20 mg QD for HLD.  Abdominal pain:  She complained of abdominal pain on and off for two weeks. Currently asymptomatic. She denies N/V or a change in her bowel habit. Denies blood in her stool. She wants to check this, given her hx of a liver cyst.  PERTINENT  PMH / PSH: PMHx reviewed  OBJECTIVE:   Vitals:   11/02/21 1031 11/02/21 1052  BP: (!) 101/43 (!) 116/47  Pulse: 78   SpO2: 100%   Weight: 140 lb 6.4 oz (63.7 kg)   Height: 5\' 2"  (1.575 m)     Physical Exam Vitals and nursing note reviewed.  Cardiovascular:     Rate and Rhythm: Normal rate and regular rhythm.     Pulses: Normal pulses.     Heart sounds: Normal heart sounds. No murmur heard. Pulmonary:     Effort:  Pulmonary effort is normal. No respiratory distress.     Breath sounds: Normal breath sounds.  Abdominal:     General: Abdomen is flat. Bowel sounds are normal. There is no distension.     Palpations: Abdomen is soft. There is no mass.     Tenderness: There is no abdominal tenderness.  Musculoskeletal:     Right shoulder: Normal.     Left shoulder: Normal.     Right lower leg: No edema.     Left lower leg: No edema.     Comments: Sensory exam of the foot is normal, tested with the monofilament. Good pulses, no lesions or ulcers, good peripheral pulses. B/L Bunions with calluses. + 2nd hammer toes   Neurological:     Mental Status: She is alert.     ASSESSMENT/PLAN:   Shoulder pain Likely DJD. Xray ordered. Continue Tylenol prn.  Diabetes mellitus type 2, controlled ([place]) A1C checked today is below goal at 6.9 I advised Metformin 1000 mg BID as prescribed and reduce Lantus from 20 to 15 units daily. Continue home CBG monitoring. Will discuss addition of GLP1/SGLT2 once her glucose control stabilizes. F/U in 3 months for A1C check. She declined podiatry referral for comprehensive foot exam and management.  Resistant hypertension Slightly low BP today, especially her systolic. D/C Norvasc for now. Continue home BP  monitoring. F/U in 2 weeks for reassessment. She agreed with the plan.   CKD (chronic kidney disease) stage 3, GFR 30-59 ml/min Cmet checked today.  HYPERCHOLESTEROLEMIA FLP checked today.  Liver cyst Currently asymptomatic. Normal GI exam. She is up to date with her colon cancer screen. Obtain Cmet. Consider repeat RUQ [place] pending lab results. She agreed with the plan.   She declined both COVID19 shot. Flu shot given. Will get Shingrix at her pharmacy. Script printed for her. NB: She brought up vaginal pressure at the end of her visit attributed to prolapse. I advised addressing this at her next appointment and she agreed with the plan.  More than  50% of this 45 minutes appointment was spent on counseling, evaluation and coordination of care.  Andrena Mews, MD [place]

## 2021-11-02 NOTE — Assessment & Plan Note (Signed)
Likely DJD. Xray ordered. Continue Tylenol prn.

## 2021-11-02 NOTE — Patient Instructions (Signed)
It was nice seeing you today. You mmight have arthritis of your shoulder. Please, go to the radiology department for an xray. You A1C is below goal. Please reduce your Lantus to 15 units daily and discontinue Norvasc because your BP is a bit low. Continue to monitor BP and CBG at home and see me back in 2 weeks.

## 2021-11-02 NOTE — Assessment & Plan Note (Signed)
Cmet checked today.

## 2021-11-02 NOTE — Assessment & Plan Note (Signed)
FLP checked today.

## 2021-11-02 NOTE — Assessment & Plan Note (Signed)
Currently asymptomatic. Normal GI exam. She is up to date with her colon cancer screen. Obtain Cmet. Consider repeat RUQ [place] pending lab results. She agreed with the plan.

## 2021-11-02 NOTE — Assessment & Plan Note (Signed)
Slightly low BP today, especially her systolic. D/C Norvasc for now. Continue home BP monitoring. F/U in 2 weeks for reassessment. She agreed with the plan.

## 2021-11-03 LAB — CMP14+EGFR
ALT: 13 IU/L (ref 0–32)
AST: 14 IU/L (ref 0–40)
Albumin/Globulin Ratio: 1.8 (ref 1.2–2.2)
Albumin: 4.2 g/dL (ref 3.7–4.7)
Alkaline Phosphatase: 79 IU/L (ref 44–121)
BUN/Creatinine Ratio: 16 (ref 12–28)
BUN: 25 mg/dL (ref 8–27)
Bilirubin Total: 0.5 mg/dL (ref 0.0–1.2)
CO2: 27 mmol/L (ref 20–29)
Calcium: 9.8 mg/dL (ref 8.7–10.3)
Chloride: 104 mmol/L (ref 96–106)
Creatinine, Ser: 1.54 mg/dL — ABNORMAL HIGH (ref 0.57–1.00)
Globulin, Total: 2.3 g/dL (ref 1.5–4.5)
Glucose: 116 mg/dL — ABNORMAL HIGH (ref 70–99)
Potassium: 5.1 mmol/L (ref 3.5–5.2)
Sodium: 143 mmol/L (ref 134–144)
Total Protein: 6.5 g/dL (ref 6.0–8.5)
eGFR: 34 mL/min/{1.73_m2} — ABNORMAL LOW (ref >59)

## 2021-11-03 LAB — LIPID PANEL
Chol/HDL Ratio: 2.5 ratio (ref 0.0–4.4)
Cholesterol, Total: 117 mg/dL (ref 100–199)
HDL: 46 mg/dL (ref >39)
LDL Chol Calc (NIH): 53 mg/dL (ref 0–99)
Triglycerides: 98 mg/dL (ref 0–149)
VLDL Cholesterol Cal: 18 mg/dL (ref 5–40)

## 2021-11-05 ENCOUNTER — Telehealth: Payer: Self-pay | Admitting: Family Medicine

## 2021-11-05 ENCOUNTER — Encounter: Payer: Self-pay | Admitting: Family Medicine

## 2021-11-05 DIAGNOSIS — N183 Chronic kidney disease, stage 3 unspecified: Secondary | ICD-10-CM

## 2021-11-05 DIAGNOSIS — N1832 Chronic kidney disease, stage 3b: Secondary | ICD-10-CM

## 2021-11-05 NOTE — Telephone Encounter (Signed)
I discussed test results with her. FLP looks good - no meds adjustment. Liver function looks good. I discussed her Liver U/S and CT which both showed benign findings and no indication to follow. She agreed with the plan. Kidney function worsened. I discussed referral to Nephrology - she agreed with the plan.  Some confusion with her BP meds. She informed me during her last visit that she was on Norvasc which I advised her to d/c due to low BP. Now she tells me she does not think she has been taking it. I advised pharmacy visit for medication review and BP check. She will come in with all her medications. She verbalized understanding.

## 2021-11-06 NOTE — Progress Notes (Signed)
Cardiology Office Note    Date:  11/20/2021   ID:  Ndia, Sampath 11/25/1941, MRN [id number]   PCP:  [name], MD   [facility]  Cardiologist:  Jenkins Rouge, MD   Advanced Practice Provider:  No care team member to display Electrophysiologist:  None   [phone]   Chief Complaint  Patient presents with   Follow-up     History of Present Illness:  [name] is a 80 y.o. female with history of CAD status post NSTEMI POBA to PDA 2001, cutting POBA to diagonal 2010, cath 2012 PDA and diagonal sites patent mild nonobstructive disease elsewhere, no ischemia on Myoview 2015, hypertension, HLD, DM, chronic anemia requiring iron infusions.  Patient saw Dr. Johnsie Cancel 11/2020 and was doing well.  Patient comes in for yearly f/u. Denies chest pain, dyspnea, dizziness, edema, or presyncope. Has lost 8 lbs and BP has been lower. PCP stopped amlodipine and then decreased lisinopril/HCTZ. Crt 1.54 11/02/21-referred to kidney specialists. 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 No regular exercise outside of that.    Past Medical History:  Diagnosis Date   Aortic arch atherosclerosis ([place]) 08/29/2017   Carpal tunnel syndrome 02/19/2007   Qualifier: Diagnosis of  By: Samara Snide     Coronary artery disease    a. Remote PTCA of the PDA;  b. 02/2009 PCI/CBA to the D1;  c. 11/2011 Cath: LM nl, LAD 20p, 6m D1 patent, RI 20p, LCX nl, RCA 30d, EF 55-65%.// Myoview 04/2020: EF 66 no ischemia or infarction, low risk    CTS (carpal tunnel syndrome)    Dysphagia 11/13/2018   GERD (gastroesophageal reflux disease)    History of papillary adenocarcinoma of thyroid 1992   right thyroid lobectomy   History of thyroid cancer    Hypothyroid    Hypothyroidism 02/19/2007   Qualifier: Diagnosis of  By: OSamara Snide    IBS (irritable bowel syndrome) 12/27/2014   Nodule of right lung 08/29/2017   3 mm stable nodule right upper lobe on CTA  Chest 08/28/17   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 08/29/2017   Subscapular pain, left 08/29/2017    Past Surgical History:  Procedure Laterality Date   ANGIOPLASTY     PDA 90-40 %   BARTHOLIN GLAND CYST EXCISION  09-13-2004   Keratosis- no CA   BREAST BIOPSY     LEFT SPOT REMOVED   BREAST EXCISIONAL BIOPSY Left    CARDIAC CATHETERIZATION  03/15/2009   stenosis of diagonal branch of LAD- PTCA performed   CHOLECYSTECTOMY  08-23-2002   CORONARY ANGIOPLASTY     ENDOMETRIAL BIOPSY  02-20-1998   LEFT HEART CATHETERIZATION WITH CORONARY ANGIOGRAM N/A 11/26/2011   Procedure: LEFT HEART CATHETERIZATION WITH CORONARY ANGIOGRAM;  Surgeon: Peter M JMartinique MD;  [facility];  Service: Cardiovascular;  Laterality: N/A;   PTCA  10/23/2000 and 03/15/09   THYROIDECTOMY Right 10-06-1991   Hashimotos and papillary carcinoma   TONSILLECTOMY     TUBAL LIGATION      Current Medications: Current Meds  Medication Sig   Accu-Chek FastClix Lancets MISC USE ONE LANCET TO TEST THREE TIMES A DAY   aspirin 81 MG chewable tablet Chew 81 mg by mouth daily.   atorvastatin (LIPITOR) 20 MG tablet TAKE ONE TABLET BY MOUTH DAILY AT 6PM (Patient taking differently: Take 20 mg by mouth daily at 6 PM.)   Blood Glucose Monitoring Suppl (ACCU-CHEK  GUIDE) w/Device KIT USE AS DIRECTED IN THE MORNING, AT NOON AND AT BEDTIME (Patient taking differently: in the morning, at noon, and at bedtime.)   carvedilol (COREG) 25 MG tablet TAKE ONE TABLET BY MOUTH TWICE A DAY WITH MEALS (Patient taking differently: Take 25 mg by mouth 2 (two) times daily with a meal.)   ferrous sulfate 325 (65 FE) MG tablet Take 325 mg by mouth daily with breakfast.   glucose blood (ACCU-CHEK GUIDE) test strip USE ONE STRIP TO TEST THREE TIMES A DAY   insulin glargine (LANTUS) 100 UNIT/ML injection Inject 0.15 mLs (15 Units total) into the skin daily.   Insulin Syringe-Needle U-100 (RELION INSULIN SYRINGE) 31G X 15/64" 1 ML MISC USE   AS DIRECTED TID FOR CBG CHECK   isosorbide mononitrate (IMDUR) 60 MG 24 hr tablet Take 1.5 tablets (90 mg total) by mouth daily.   JARDIANCE 10 MG TABS tablet TAKE ONE TABLET BY MOUTH DAILY   lisinopril-hydrochlorothiazide (ZESTORETIC) 20-12.5 MG tablet TAKE TWO TABLETS BY MOUTH DAILY (Patient taking differently: Take 1 tablet by mouth daily.)   metFORMIN (GLUCOPHAGE-XR) 500 MG 24 hr tablet TAKE TWO TABLETS BY MOUTH TWICE A DAY (Patient taking differently: Take 1,000 mg by mouth 2 (two) times daily.)   Multiple Vitamin (MULTIVITAMIN WITH MINERALS) TABS tablet Take 1 tablet by mouth daily. Reported on 04/02/2016   triamcinolone ointment (KENALOG) 0.5 % Apply 1 application topically 2 (two) times daily.     Allergies:   Codeine, Sulfamethoxazole-trimethoprim, and Tramadol   Social History   Socioeconomic History   Marital status: Widowed    Spouse name: Not on file   Number of children: 6   Years of education: 10   Highest education level: Not on file  Occupational History   Occupation: Housekeeper    Employer: [employer]   Tobacco Use   Smoking status: Former    Types: Cigarettes    Quit date: 12/23/1980    Years since quitting: 40.9   Smokeless tobacco: Former    Types: Chew    Quit date: 08/23/1994  Vaping Use   Vaping Use: Never used  Substance and Sexual Activity   Alcohol use: No   Drug use: No   Sexual activity: Never  Other Topics Concern   Not on file  Social History Narrative   Lives in [place] with Granddaughter.  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  She is not routinely exercising.   Has her GED   Social Determinants of Health   Financial Resource Strain: Not on file  Food Insecurity: Not on file  Transportation Needs: Not on file  Physical Activity: Not on file  Stress: Not on file  Social Connections: Not on file     Family History:  The patient's  family history includes Breast cancer in her daughter; Diabetes in her mother; Diabetes  Mellitus II in her brother and brother; Epilepsy in her brother; Heart attack in her brother, brother, and sister; Hypertension in her mother; Other in her father.   ROS:   Please see the history of present illness.    ROS All other systems reviewed and are negative.   PHYSICAL EXAM:   VS:  BP (!) 130/50   Pulse 73   Ht 5' 2" (1.575 m)   Wt 140 lb 6.4 oz (63.7 kg)   LMP 11/24/1992   SpO2 99%   BMI 25.68 kg/m   Physical Exam  GEN: Well nourished, well developed, in no   acute distress  Neck: no JVD, carotid bruits, or masses Cardiac:RRR; no murmurs, rubs, or gallops  Respiratory:  clear to auscultation bilaterally, normal work of breathing GI: soft, nontender, nondistended, + BS Ext: without cyanosis, clubbing, or edema, Good distal pulses bilaterally Neuro:  Alert and Oriented x 3 Psych: euthymic mood, full affect  Wt Readings from Last 3 Encounters:  11/20/21 140 lb 6.4 oz (63.7 kg)  11/02/21 140 lb 6.4 oz (63.7 kg)  09/19/21 142 lb (64.4 kg)      Studies/Labs Reviewed:   EKG:  EKG is not ordered today.     Recent Labs: 08/03/2021: Hemoglobin 9.6; Platelet Count 152 11/02/2021: ALT 13; BUN 25; Creatinine, Ser 1.54; Potassium 5.1; Sodium 143   Lipid Panel    Component Value Date/Time   CHOL 117 11/02/2021 1110   TRIG 98 11/02/2021 1110   HDL 46 11/02/2021 1110   CHOLHDL 2.5 11/02/2021 1110   CHOLHDL 2.2 08/06/2016 1135   VLDL 24 08/06/2016 1135   LDLCALC 53 11/02/2021 1110   LDLDIRECT 76 02/06/2011 1134    Additional studies/ records that were reviewed today include:  Myoview 04/26/2020 EF 69, no ischemia or infarction, low risk   Carotid US 03/31/2015 Bilateral ICA 1-39   Myoview 01/19/2014 No ischemia, EF 70; inferolateral scar   Myoview 03/07/2013 Inferolateral scar, no ischemia, EF 72   Cardiac catheterization 11/26/2011 LAD proximal 20, mid 30; D1 patent RI proximal 20 LCx small without significant disease RCA patent; PDA patent, 35 prior to  bifurcation EF 55-65   Cardiac catheterization 03/14/2009 LAD mid 30-40; BX proximal 80-90 RCA 30; PDA 50 Normal LV function PCI: Cutting Balloon angioplasty to the diagonal   Echocardiogram 11/19/2006 EF 55       Risk Assessment/Calculations:         ASSESSMENT:    1. Coronary artery disease involving native coronary artery of native heart without angina pectoris   2. Essential hypertension   3. Controlled type 2 diabetes mellitus with diabetic nephropathy, without long-term current use of insulin ([place])   4. Hyperlipidemia, unspecified hyperlipidemia type   5. Anemia of chronic disease   6. Bruit of left carotid artery      PLAN:  In order of problems listed above:   CAD:  PTCA PDA in 2001 and diagonal in 2010 with patent stents on cath in 2012 Myovue done 04/26/20 low risk with no ischemia EF 69%  Continue medical Rx. No angina. 150 min exercise daily.    HTN:  has been running low and amlodipine stopped and lisinopril/HCTZ decreased to one tablet by PCP. She monitors at home.   DM2:   A1c 6.9  on Jardiance    HLD continue statin LDL at goal 53 10/2021    Anemia:  followed by heme.    Carotid bruit-last dopplers 2016. Will check  Shared Decision Making/Informed Consent        Medication Adjustments/Labs and Tests Ordered: Current medicines are reviewed at length with the patient today.  Concerns regarding medicines are outlined above.  Medication changes, Labs and Tests ordered today are listed in the Patient Instructions below. Patient Instructions  Medication Instructions:  Your physician recommends that you continue on your current medications as directed. Please refer to the Current Medication list given to you today.  *If you need a refill on your cardiac medications before your next appointment, please call your pharmacy*   Lab Work: TODAY: BMET  If you have labs (blood work) drawn today and your  tests are completely normal, you will receive your  results only by: MyChart Message (if you have MyChart) OR A paper copy in the mail If you have any lab test that is abnormal or we need to change your treatment, we will call you to review the results.   Testing/Procedures: Your physician has requested that you have a carotid duplex. This test is an ultrasound of the carotid arteries in your neck. It looks at blood flow through these arteries that supply the brain with blood. Allow one hour for this exam. There are no restrictions or special instructions.   Follow-Up: At [facility], you and your health needs are our priority.  As part of our continuing mission to provide you with exceptional heart care, we have created designated Provider Care Teams.  These Care Teams include your primary Cardiologist (physician) and Advanced Practice Providers (APPs -  Physician Assistants and Nurse Practitioners) who all work together to provide you with the care you need, when you need it.  We recommend signing up for the patient portal called "MyChart".  Sign up information is provided on this After Visit Summary.  MyChart is used to connect with patients for Virtual Visits (Telemedicine).  Patients are able to view lab/test results, encounter notes, upcoming appointments, etc.  Non-urgent messages can be sent to your provider as well.   To learn more about what you can do with MyChart, go to [email or web address].    Your next appointment:   1 year(s)  The format for your next appointment:   In Person  Provider:   Peter Nishan, MD    Signed, Michele Lenze, PA-C  11/20/2021 8:45 AM    [facility] [address], [place], [place]  [address] Phone: [phone]; Fax: [phone]    

## 2021-11-07 ENCOUNTER — Ambulatory Visit (HOSPITAL_COMMUNITY)
Admission: RE | Admit: 2021-11-07 | Discharge: 2021-11-07 | Disposition: A | Discharge status: Home / Self Care | Payer: Medicare PPO | Source: Ambulatory Visit | Attending: Family Medicine | Admitting: Family Medicine

## 2021-11-07 ENCOUNTER — Ambulatory Visit (INDEPENDENT_AMBULATORY_CARE_PROVIDER_SITE_OTHER): Payer: Medicare PPO | Admitting: Pharmacist

## 2021-11-07 ENCOUNTER — Other Ambulatory Visit: Payer: Self-pay

## 2021-11-07 DIAGNOSIS — I10 Essential (primary) hypertension: Secondary | ICD-10-CM | POA: Diagnosis not present

## 2021-11-07 DIAGNOSIS — G8929 Other chronic pain: Secondary | ICD-10-CM | POA: Diagnosis not present

## 2021-11-07 DIAGNOSIS — M25512 Pain in left shoulder: Secondary | ICD-10-CM | POA: Insufficient documentation

## 2021-11-07 NOTE — Progress Notes (Signed)
   Subjective:    Patient ID: GAO MITNICK, female    DOB: [birth year], 80 y.o.   MRN: [id number]  HPI Patient is a 80 y.o. female who presents for hypertension management. She is in good spirits and presents without assistance. Patient was referred and last seen by Primary Care Provider on 11/02/21.  Hypertension ROS: taking medications as instructed, no medication side effects noted, home BP monitoring in range of 517'G systolic over 01-74'B diastolic, no chest pain on exertion, no dyspnea on exertion, no swelling of ankles, and does note some dizziness when arising.  New concerns: none.   Dietary habits include: Breakfast: grits, eggs, sausage; Lunch: cabbage, chicken, rice; Dinner: similar to lunch Exercise habits include: denies  Objective:   Last 3 Office BP readings: BP Readings from Last 3 Encounters:  11/07/21 (!) 118/52  11/02/21 (!) 116/47  09/19/21 130/60    BMET    Component Value Date/Time   NA 143 11/02/2021 1110   K 5.1 11/02/2021 1110   CL 104 11/02/2021 1110   CO2 27 11/02/2021 1110   GLUCOSE 116 (H) 11/02/2021 1110   GLUCOSE 111 (H) 08/03/2021 1237   BUN 25 11/02/2021 1110   CREATININE 1.54 (H) 11/02/2021 1110   CREATININE 1.38 (H) 08/03/2021 1237   CREATININE 0.84 08/06/2016 1135   CALCIUM 9.8 11/02/2021 1110   CALCIUM 9.8 04/18/2020 1100   GFRNONAA 39 (L) 08/03/2021 1237   GFRNONAA 69 08/06/2016 1135   GFRAA 55 (L) 08/07/2020 1034   GFRAA 80 08/06/2016 1135    Renal function: Estimated Creatinine Clearance: 26.4 mL/min (A) (by C-G formula based on SCr of 1.54 mg/dL (H)).  Clinical ASCVD: Yes  The ASCVD Risk score (Arnett DK, et al., 2019) failed to calculate for the following reasons:   The valid total cholesterol range is 130 to 320 mg/dL  BP (!) 118/52   LMP 11/24/1992   Appearance alert, well appearing, and in no distress and oriented to person, place, and time. General exam BP noted to be low today in office.   Assessment/Plan:   Hypertension  overly controlled .  The following changes are to be made: patient to decrease lisinopril-HCTZ 20-12.5mg  from two tablets daily to one tablet daily Follow up: 2 weeks with PCP  Results reviewed and written information provided.   Total time in face-to-face counseling 25 minutes.

## 2021-11-07 NOTE — Assessment & Plan Note (Signed)
Hypertension overly controlled.  The following changes are to be made: patient to decrease lisinopril-HCTZ 20-12.5mg  from two tablets daily to one tablet daily Follow up: 2 weeks with PCP

## 2021-11-07 NOTE — Patient Instructions (Signed)
[name] it was a pleasure seeing you today.   Your blood pressure today is was still on the lower end.  Since you are still having a little dizziness and your blood pressure readings have been low at home please take only one tablet of your lisinopril-HCTZ 20-12.mg medication. Continue all other medications as is.  Take your blood pressure at home if you are able. Please write down these numbers and bring them to your visits.  If you have any questions please call me or the clinic.  You have a follow-up appointment with Dr. Gwendlyn Deutscher in a couple weeks.

## 2021-11-08 ENCOUNTER — Telehealth: Payer: Self-pay | Admitting: Family Medicine

## 2021-11-08 NOTE — Telephone Encounter (Signed)
Xray report discussed. PT referral recommended for her shoulder pain. She stated that her pain has improved and it does not bother her now, hence will like to defer PT referral. Monitor closely for now.

## 2021-11-14 ENCOUNTER — Ambulatory Visit: Payer: Medicare PPO

## 2021-11-14 NOTE — Patient Instructions (Signed)
Visit Information  [name]  it was nice speaking with you. Please call me directly [phone] if you have questions about the goals we discussed.  Care Plan : RN Case Manager  Updates made by Lazaro Arms, RN since 11/14/2021 12:00 AM     Problem: Hypertension and Diabetes      Long-Range Goal: Monitor HTN and DM II   Start Date: 11/17/2019  Expected End Date: 12/21/2021  Recent Progress: On track  Priority: Medium  Note:    Current Barriers:  Chronic Disease Management support, education, and care coordination needs related to HTN and DMII  Clinical Goal(s) related to HTN and DMII:  Over the next 90 days, patient will:  Work with the care management team to address educational, disease management, and care coordination needs  Begin or continue self health monitoring activities as directed today Measure and record cbg (blood glucose) as prescribed times daily, Measure and record blood pressure  Call provider office for new or worsened signs and symptoms  Call care management team with questions or concerns Verbalize basic understanding of patient centered plan of care established today  Interventions related to HTN and DMII:  Evaluation of current treatment plans and patient's adherence to plan as established by provider Collaborated with appropriate clinical care team members regarding patient needs Discussed plans with patient for ongoing care management follow up and provided patient with direct contact information for care management team healthy lifestyle promoted activity based on tolerance and functional limitations encouraged quality of sleep assessed-patient states that she sleeps well HTN reduction of dietary sodium encouraged blood pressure trends reviewed-  home or ambulatory blood pressure monitoring encouraged DM II Provided patient with written and verbal  educational materials related to hypo and hyperglycemia and importance of correct treatment Review  of patient status, including review of consultants reports, relevant laboratory and other test results, and medications completed. blood glucose monitoring encouraged blood glucose readings reviewed-  11/14/21: [name] states that she is doing well and getting her cooking done for Thanksgiving. Mrs. Schoenfelder has had issues with low blood pressure. Her blood pressure yesterday was 138/46, her pulse of 74, and her blood sugars range between 97-111. We reviewed the protocol for hypoglycemia. Dr. Gwendlyn Deutscher asked that she take lisinopril hydrochlorothiazide once a day instead of twice until she is seen again in the office and stops her Norvasc.    Patient Goals/Self Care Activities: -Patient/Caregiver will self-administer medications as prescribed as evidenced by self-report/primary caregiver report ,  -Patient/Caregiver will attend all scheduled provider appointments as evidenced by clinician review of documented attendance to scheduled appointments and patient/caregiver report,  -Patient/Caregiver will call pharmacy for medication refills as evidenced by patient report and review of pharmacy fill history as appropriate,  -Patient/Caregiver will call provider office for new concerns or questions as evidenced by review of documented incoming telephone call notes and patient report,  Patient/Caregiver verbalizes understanding of plan,  -Patient/Caregiver will focus on medication adherence by taking medications as prescribed -Calls provider office for new concerns, questions, or BP outside discussed parameters -Checks BP and records as discussed -Follows a low sodium diet/DASH diet -check blood sugar at prescribed times -check blood sugar if I feel it is too high or too low -take the blood sugar meter to all doctor visits          The patient verbalized understanding of instructions, educational materials, and care plan provided today and declined offer to receive copy of patient instructions,  educational materials, and care  plan.   Follow up Plan: Patient would like continued follow-up.  CCM RNCM will outreach the patient within the next 5 weeks.  Patient will call office if needed prior to next encounter  Lazaro Arms, RN  [phone]

## 2021-11-14 NOTE — Chronic Care Management (AMB) (Signed)
Chronic Care Management   CCM RN Visit Note  11/14/2021 Name: [name] MRN: [id number] DOB: 09/29/1941  Subjective: [name] is a 80 y.o. year old female who is a primary care patient of [name], MD. The care management team was consulted for assistance with disease management and care coordination needs.    Engaged with patient by telephone for follow up visit in response to provider referral for case management and/or care coordination services.   Consent to Services:  The patient was given information about Chronic Care Management services, agreed to services, and gave verbal consent prior to initiation of services.  Please see initial visit note for detailed documentation.   Patient agreed to services and verbal consent obtained.    Assessment: Patient is currently experiencing difficulty with low blood pressure but she will continue to monitor her vitals.. See Care Plan below for interventions and patient self-care actives. Follow up Plan: Patient would like continued follow-up.  CCM RNCM will outreach the patient within the next 5 weeks.  Patient will call office if needed prior to next encounter  Review of patient past medical history, allergies, medications, health status, including review of consultants reports, laboratory and other test data, was performed as part of comprehensive evaluation and provision of chronic care management services.   SDOH (Social Determinants of Health) assessments and interventions performed:    CCM Care Plan  Allergies  Allergen Reactions   Codeine Nausea And Vomiting   Sulfamethoxazole-Trimethoprim Nausea And Vomiting   Tramadol Nausea And Vomiting    Outpatient Encounter Medications as of 11/14/2021  Medication Sig   Accu-Chek FastClix Lancets MISC USE ONE LANCET TO TEST THREE TIMES A DAY   aspirin 81 MG chewable tablet Chew 81 mg by mouth daily.   atorvastatin (LIPITOR) 20 MG tablet TAKE ONE TABLET BY MOUTH  DAILY AT 6PM (Patient taking differently: Take 20 mg by mouth daily at 6 PM.)   Blood Glucose Monitoring Suppl (ACCU-CHEK GUIDE) w/Device KIT USE AS DIRECTED IN THE MORNING, AT NOON AND AT BEDTIME (Patient taking differently: in the morning, at noon, and at bedtime.)   carvedilol (COREG) 25 MG tablet TAKE ONE TABLET BY MOUTH TWICE A DAY WITH MEALS (Patient taking differently: Take 25 mg by mouth 2 (two) times daily with a meal.)   ferrous sulfate 325 (65 FE) MG tablet Take 325 mg by mouth daily with breakfast.   glucose blood (ACCU-CHEK GUIDE) test strip USE ONE STRIP TO TEST THREE TIMES A DAY   insulin glargine (LANTUS) 100 UNIT/ML injection Inject 0.15 mLs (15 Units total) into the skin daily.   Insulin Syringe-Needle U-100 (RELION INSULIN SYRINGE) 31G X 15/64" 1 ML MISC USE  AS DIRECTED TID FOR CBG CHECK   isosorbide mononitrate (IMDUR) 60 MG 24 hr tablet Take 1.5 tablets (90 mg total) by mouth daily.   JARDIANCE 10 MG TABS tablet TAKE ONE TABLET BY MOUTH DAILY   lisinopril-hydrochlorothiazide (ZESTORETIC) 20-12.5 MG tablet TAKE TWO TABLETS BY MOUTH DAILY (Patient taking differently: Take 1 tablet by mouth 2 (two) times daily.)   metFORMIN (GLUCOPHAGE-XR) 500 MG 24 hr tablet TAKE TWO TABLETS BY MOUTH TWICE A DAY (Patient taking differently: Take 1,000 mg by mouth 2 (two) times daily.)   Multiple Vitamin (MULTIVITAMIN WITH MINERALS) TABS tablet Take 1 tablet by mouth daily. Reported on 04/02/2016   triamcinolone ointment (KENALOG) 0.5 % Apply 1 application topically 2 (two) times daily. (Patient not taking: No sig reported)   No facility-administered  encounter medications on file as of 11/14/2021.    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 08/29/2017   Nodule of right lung 08/29/2017   Aortic arch atherosclerosis ([place]) 08/29/2017   Liver cyst    Left thyroid nodule 07/27/2014   History of thyroid cancer 04/12/2013   Diabetes mellitus type 2, controlled ([place]) 02/19/2007   HYPERCHOLESTEROLEMIA 02/19/2007   Anemia of chronic disease 02/19/2007   Resistant hypertension 02/19/2007   Coronary artery disease involving native coronary artery of native heart without angina pectoris 02/19/2007    Conditions to be addressed/monitored:HTN and DMII  Care Plan : RN Case Manager  Updates made by Lazaro Arms, RN since 11/14/2021 12:00 AM     Problem: Hypertension and Diabetes      Long-Range Goal: Monitor HTN and DM II   Start Date: 11/17/2019  Expected End Date: 12/21/2021  Recent Progress: On track  Priority: Medium  Note:    Current Barriers:  Chronic Disease Management support, education, and care coordination needs related to HTN and DMII  Clinical Goal(s) related to HTN and DMII:  Over the next 90 days, patient will:  Work with the care management team to address educational, disease management, and care coordination needs  Begin or continue self health monitoring activities as directed today Measure and record cbg (blood glucose) as prescribed times daily, Measure and record blood pressure  Call provider office for new or worsened signs and symptoms  Call care management team with questions or concerns Verbalize basic understanding of patient centered plan of care established today  Interventions related to HTN and DMII:  Evaluation of current treatment plans and patient's adherence to plan as established by provider Collaborated with appropriate clinical care team members regarding patient needs Discussed plans with patient  for ongoing care management follow up and provided patient with direct contact information for care management team healthy lifestyle promoted activity based on tolerance and functional limitations encouraged quality of sleep assessed-patient states that she sleeps well HTN reduction of dietary sodium encouraged blood pressure trends reviewed-  home or ambulatory blood pressure monitoring encouraged DM II Provided patient with written and verbal  educational materials related to hypo and hyperglycemia and importance of correct treatment Review of patient status, including review of consultants reports, relevant laboratory and other test results, and medications completed. blood glucose monitoring encouraged blood glucose readings reviewed-  11/14/21: Mrs. Hampshire states that she is doing well and getting her cooking done for Thanksgiving. Mrs. Bucks has had issues with low blood pressure. Her blood pressure yesterday was 138/46, her pulse of 74, and her blood sugars range between 97-111. We reviewed the protocol for hypoglycemia. Dr. Gwendlyn Deutscher asked that she take lisinopril hydrochlorothiazide once a day instead of twice until she is seen again in the office and stops her Norvasc.    Patient Goals/Self Care Activities: -Patient/Caregiver will self-administer medications as prescribed as evidenced by self-report/primary caregiver report ,  -Patient/Caregiver will attend all scheduled provider appointments as evidenced by clinician review of documented attendance to scheduled appointments and patient/caregiver report,  -  Patient/Caregiver will call pharmacy for medication refills as evidenced by patient report and review of pharmacy fill history as appropriate,  -Patient/Caregiver will call provider office for new concerns or questions as evidenced by review of documented incoming telephone call notes and patient report,  Patient/Caregiver verbalizes understanding of plan,  -Patient/Caregiver will  focus on medication adherence by taking medications as prescribed -Calls provider office for new concerns, questions, or BP outside discussed parameters -Checks BP and records as discussed -Follows a low sodium diet/DASH diet -check blood sugar at prescribed times -check blood sugar if I feel it is too high or too low -take the blood sugar meter to all doctor visits         Lazaro Arms RN, BSN, [place] Phone: [phone] I Fax: [phone]

## 2021-11-20 ENCOUNTER — Ambulatory Visit: Payer: Medicare PPO | Admitting: Physician Assistant

## 2021-11-20 ENCOUNTER — Encounter: Payer: Self-pay | Admitting: Physician Assistant

## 2021-11-20 ENCOUNTER — Encounter: Payer: Self-pay | Admitting: Family Medicine

## 2021-11-20 ENCOUNTER — Other Ambulatory Visit: Payer: Self-pay

## 2021-11-20 ENCOUNTER — Ambulatory Visit: Payer: Medicare PPO | Admitting: Family Medicine

## 2021-11-20 VITALS — BP 130/50 | HR 73 | Ht 62.0 in | Wt 140.4 lb

## 2021-11-20 DIAGNOSIS — E785 Hyperlipidemia, unspecified: Secondary | ICD-10-CM | POA: Diagnosis not present

## 2021-11-20 DIAGNOSIS — D638 Anemia in other chronic diseases classified elsewhere: Secondary | ICD-10-CM | POA: Diagnosis not present

## 2021-11-20 DIAGNOSIS — R0989 Other specified symptoms and signs involving the circulatory and respiratory systems: Secondary | ICD-10-CM

## 2021-11-20 DIAGNOSIS — I2510 Atherosclerotic heart disease of native coronary artery without angina pectoris: Secondary | ICD-10-CM | POA: Diagnosis not present

## 2021-11-20 DIAGNOSIS — E1121 Type 2 diabetes mellitus with diabetic nephropathy: Secondary | ICD-10-CM | POA: Diagnosis not present

## 2021-11-20 DIAGNOSIS — I10 Essential (primary) hypertension: Secondary | ICD-10-CM | POA: Diagnosis not present

## 2021-11-20 LAB — BASIC METABOLIC PANEL
BUN/Creatinine Ratio: 16 (ref 12–28)
BUN: 18 mg/dL (ref 8–27)
CO2: 26 mmol/L (ref 20–29)
Calcium: 9.5 mg/dL (ref 8.7–10.3)
Chloride: 100 mmol/L (ref 96–106)
Creatinine, Ser: 1.13 mg/dL — ABNORMAL HIGH (ref 0.57–1.00)
Glucose: 227 mg/dL — ABNORMAL HIGH (ref 70–99)
Potassium: 3.9 mmol/L (ref 3.5–5.2)
Sodium: 140 mmol/L (ref 134–144)
eGFR: 50 mL/min/{1.73_m2} — ABNORMAL LOW (ref >59)

## 2021-11-20 NOTE — Progress Notes (Signed)
    SUBJECTIVE:   CHIEF COMPLAINT / HPI:   HTN: She is here for a follow-up. She is compliant with Coreg 25 mg BID, Isosorbide XR 60 mg (1.5 tablet) QD, and Zestoretic 20/12.5 mg QD (recent reduction from BID due to hypotension). She has not checked her home BP recently. Her BP measured at the cardiologist's office this morning was 240 systolic. She denies any symptoms today.  PERTINENT  PMH / PSH: PMHx reviewed.  OBJECTIVE:   Vitals:   11/20/21 0930 11/20/21 0940 11/20/21 0941  BP: (!) 157/58 (!) 161/62 (!) 162/60  Pulse: 69    SpO2: 100%    Weight: 140 lb (63.5 kg)    Height: 5\' 2"  (1.575 m)      Physical Exam Vitals and nursing note reviewed.  Cardiovascular:     Rate and Rhythm: Normal rate and regular rhythm.     Heart sounds: Normal heart sounds. No murmur heard. Pulmonary:     Effort: Pulmonary effort is normal. No respiratory distress.     Breath sounds: Normal breath sounds. No wheezing.  Abdominal:     General: Abdomen is flat. Bowel sounds are normal.     Tenderness: There is no abdominal tenderness.  Musculoskeletal:     Right lower leg: No edema.     Left lower leg: No edema.     ASSESSMENT/PLAN:   Resistant hypertension BP elevated today. I rechecked her BP on multiple occasions during this visit and it remained elevated. I reviewed her cardiology encounter note from this morning and her recorded BP was 130/50. Plan is to continue her current BP regimen and f/u in 2 weeks for reassessment. May need BP adjustment if it remains elevated. I advised that she come in with her home BP readings during her next visit. She agreed with the plan.    More than 50% of this 25 min face to face encounter was spent on record review, counseling.  Andrena Mews, MD [place]

## 2021-11-20 NOTE — Patient Instructions (Signed)
Medication Instructions:  Your physician recommends that you continue on your current medications as directed. Please refer to the Current Medication list given to you today.  *If you need a refill on your cardiac medications before your next appointment, please call your pharmacy*   Lab Work: TODAY: BMET  If you have labs (blood work) drawn today and your tests are completely normal, you will receive your results only by: [place] (if you have MyChart) OR A paper copy in the mail If you have any lab test that is abnormal or we need to change your treatment, we will call you to review the results.   Testing/Procedures: Your physician has requested that you have a carotid duplex. This test is an ultrasound of the carotid arteries in your neck. It looks at blood flow through these arteries that supply the brain with blood. Allow one hour for this exam. There are no restrictions or special instructions.   Follow-Up: At [facility], you and your health needs are our priority.  As part of our continuing mission to provide you with exceptional heart care, we have created designated Provider Care Teams.  These Care Teams include your primary Cardiologist (physician) and Advanced Practice Providers (APPs -  Physician Assistants and Nurse Practitioners) who all work together to provide you with the care you need, when you need it.  We recommend signing up for the patient portal called "MyChart".  Sign up information is provided on this After Visit Summary.  MyChart is used to connect with patients for Virtual Visits (Telemedicine).  Patients are able to view lab/test results, encounter notes, upcoming appointments, etc.  Non-urgent messages can be sent to your provider as well.   To learn more about what you can do with MyChart, go to [email or web address].    Your next appointment:   1 year(s)  The format for your next appointment:   In Person  Provider:   Jenkins Rouge, MD

## 2021-11-20 NOTE — Patient Instructions (Addendum)
It was nice seeing you today.Your BP increased from your last visit and it is now above your goal. Please continue to take 1 tablet of your Zestoretic daily in addition to other medications. Keep a BP log and see me back in 2 weeks.

## 2021-11-20 NOTE — Assessment & Plan Note (Signed)
BP elevated today. I rechecked her BP on multiple occasions during this visit and it remained elevated. I reviewed her cardiology encounter note from this morning and her recorded BP was 130/50. Plan is to continue her current BP regimen and f/u in 2 weeks for reassessment. May need BP adjustment if it remains elevated. I advised that she come in with her home BP readings during her next visit. She agreed with the plan.

## 2021-11-29 ENCOUNTER — Other Ambulatory Visit: Payer: Self-pay | Admitting: Family Medicine

## 2021-11-29 ENCOUNTER — Other Ambulatory Visit: Payer: Self-pay

## 2021-11-29 ENCOUNTER — Ambulatory Visit (HOSPITAL_COMMUNITY)
Admission: RE | Admit: 2021-11-29 | Discharge: 2021-11-29 | Disposition: A | Discharge status: Home / Self Care | Payer: Medicare PPO | Source: Ambulatory Visit | Attending: Cardiology | Admitting: Cardiology

## 2021-11-29 DIAGNOSIS — R0989 Other specified symptoms and signs involving the circulatory and respiratory systems: Secondary | ICD-10-CM | POA: Diagnosis not present

## 2021-12-07 ENCOUNTER — Ambulatory Visit: Payer: Medicare PPO | Admitting: Family Medicine

## 2021-12-09 ENCOUNTER — Ambulatory Visit (HOSPITAL_COMMUNITY)
Admission: EM | Admit: 2021-12-09 | Discharge: 2021-12-09 | Disposition: A | Discharge status: Home / Self Care | Payer: Medicare PPO | Attending: Emergency Medicine | Admitting: Emergency Medicine

## 2021-12-09 ENCOUNTER — Ambulatory Visit (INDEPENDENT_AMBULATORY_CARE_PROVIDER_SITE_OTHER): Payer: Medicare PPO

## 2021-12-09 ENCOUNTER — Other Ambulatory Visit: Payer: Self-pay

## 2021-12-09 ENCOUNTER — Encounter (HOSPITAL_COMMUNITY): Payer: Self-pay | Admitting: Emergency Medicine

## 2021-12-09 DIAGNOSIS — M79642 Pain in left hand: Secondary | ICD-10-CM

## 2021-12-09 DIAGNOSIS — R2232 Localized swelling, mass and lump, left upper limb: Secondary | ICD-10-CM | POA: Diagnosis not present

## 2021-12-09 DIAGNOSIS — M7989 Other specified soft tissue disorders: Secondary | ICD-10-CM | POA: Diagnosis not present

## 2021-12-09 DIAGNOSIS — M1812 Unilateral primary osteoarthritis of first carpometacarpal joint, left hand: Secondary | ICD-10-CM

## 2021-12-09 DIAGNOSIS — I10 Essential (primary) hypertension: Secondary | ICD-10-CM | POA: Diagnosis not present

## 2021-12-09 MED ORDER — HYDROCODONE-ACETAMINOPHEN 5-325 MG PO TABS: 1.0000 | ORAL_TABLET | Freq: Four times a day (QID) | ORAL | 0 refills | Status: DC | PRN

## 2021-12-09 NOTE — ED Triage Notes (Addendum)
Friday, left wrist started hurting and has increased in pain each day.  Patient has no recent injury.  Patient can move thumb and fingers.  Swelling to base of thumb and area of hand and wrist on radial side of wrist.    Left radial pulse 2 +, brisk cap refill.

## 2021-12-09 NOTE — Discharge Instructions (Addendum)
°  Norco/Vicodin (hydrocodone-acetaminophen) is a narcotic pain medication, do not combine these medications with others containing tylenol. While taking, do not drink alcohol, drive, or perform any other activities that requires focus while taking these medications.   You may wear the brace as needed to help with pain and discomfort. You can remove the brace to apply a cool or warm pack 2-3 times daily to help with discomfort.    Follow up with primary care for blood pressure monitoring and recheck of your hand if not improving in 1-2 weeks. You may also try calling to schedule a follow up appointment with [facility] for further evaluation and treatment of your hand pain and swelling if not improving.

## 2021-12-09 NOTE — ED Provider Notes (Signed)
[place]    CSN: [id number] Arrival date & time: 12/09/21  1009      History   Chief Complaint Chief Complaint  Patient presents with   Wrist Pain    HPI [name] is a 80 y.o. female.   HPI [name] is a 80 y.o. female presenting to UC with c/o Left hand pain and swelling at base of her thumb that started 2 days ago. Gradually worsening. No relief with OTC Tylenol Severe.  Pain is aching and sore, worse with movement, sharp in nature. No known injury or repetitive motion she recalls.  Hx of ankle pain that prevented her from walking due to pain for a few days in the past but was never told what caused the pain. No known hx of gout.   BP elevated in triage. Pt states she has not taken her BP medication today.  Past Medical History:  Diagnosis Date   Aortic arch atherosclerosis ([place]) 08/29/2017   Carpal tunnel syndrome 02/19/2007   Qualifier: Diagnosis of  By: Samara Snide     Coronary artery disease    a. Remote PTCA of the PDA;  b. 02/2009 PCI/CBA to the D1;  c. 11/2011 Cath: LM nl, LAD 20p, 35m D1 patent, RI 20p, LCX nl, RCA 30d, EF 55-65%.// Myoview 04/2020: EF 628 no ischemia or infarction, low risk    CTS (carpal tunnel syndrome)    Dysphagia 11/13/2018   GERD (gastroesophageal reflux disease)    History of papillary adenocarcinoma of thyroid 1992   right thyroid lobectomy   History of thyroid cancer    Hypothyroid    Hypothyroidism 02/19/2007   Qualifier: Diagnosis of  By: OSamara Snide    IBS (irritable bowel syndrome) 12/27/2014   Nodule of right lung 08/29/2017   3 mm stable nodule right upper lobe on CTA Chest 08/28/17   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 08/29/2017   Subscapular pain, left 08/29/2017    Patient Active Problem List   Diagnosis Date Noted   Shoulder pain 11/02/2021   Skin lesion of breast 09/23/2021   Inclusion cyst 04/03/2021   Arthritis of ankle, right 11/15/2019   Stage 3b chronic kidney disease  (CKD) ([place] 11/15/2019   History of MI (myocardial infarction) 06/18/2019   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   Iron deficiency anemia 02/25/2019   [place]ocyt[place] 02/09/2019   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 08/29/2017   Nodule of right lung 08/29/2017   [place] 08/29/2017   Liver cyst    Left thyroid nodule 07/27/2014   History of thyroid cancer 04/12/2013   Diabetes mellitus type 2, controlled ([place] 02/19/2007   HYPERCHOLESTEROLEMIA 02/19/2007   Anemia of chronic disease 02/19/2007   Resistant hypertension 02/19/2007   Coronary artery disease involving native coronary artery of native heart without angina pectoris 02/19/2007    Past Surgical History:  Procedure Laterality Date   ANGIOPLASTY     PDA 90-40 %   BARTHOLIN GLAND CYST EXCISION  09-13-2004   Keratosis- no CA   BREAST BIOPSY     LEFT SPOT REMOVED   BREAST EXCISIONAL BIOPSY Left    CARDIAC CATHETERIZATION  03/15/2009   stenosis of diagonal branch of LAD- PTCA performed   CHOLECYSTECTOMY  08-23-2002   CORONARY ANGIOPLASTY     ENDOMETRIAL BIOPSY  02-20-1998   LEFT HEART CATHETERIZATION WITH CORONARY ANGIOGRAM N/A 11/26/2011   Procedure: LEFT HEART CATHETERIZATION WITH  CORONARY ANGIOGRAM;  Surgeon: Peter M Martinique, MD;  Location: [facility] LAB;  Service: Cardiovascular;  Laterality: N/A;   PTCA  10/23/2000 and 03/15/09   THYROIDECTOMY Right 10-06-1991   Hashimotos and papillary carcinoma   TONSILLECTOMY     TUBAL LIGATION      OB History   No obstetric history on file.      Home Medications    Prior to Admission medications   Medication Sig Start Date End Date Taking? Authorizing Provider  HYDROcodone-acetaminophen (NORCO/VICODIN) 5-325 MG tablet Take 1 tablet by mouth every 6 (six) hours as needed. 12/09/21  Yes Layli Capshaw O, PA-C  Accu-Chek FastClix Lancets MISC USE ONE LANCET TO TEST THREE TIMES A DAY 07/25/21   [name], MD   aspirin 81 MG chewable tablet Chew 81 mg by mouth daily.    [provider]  atorvastatin (LIPITOR) 20 MG tablet TAKE ONE TABLET BY MOUTH DAILY AT [facility] 11/29/21   [name], MD  Blood Glucose Monitoring Suppl (ACCU-CHEK GUIDE) w/Device KIT USE AS DIRECTED IN THE MORNING, AT NOON AND AT BEDTIME Patient taking differently: in the morning, at noon, and at bedtime. 07/26/20   [name], MD  carvedilol (COREG) 25 MG tablet TAKE ONE TABLET BY MOUTH TWICE A DAY WITH MEALS Patient taking differently: Take 25 mg by mouth 2 (two) times daily with a meal. 04/09/21   Andrena Mews T, MD  ferrous sulfate 325 (65 FE) MG tablet Take 325 mg by mouth daily with breakfast.    [provider]  glucose blood (ACCU-CHEK GUIDE) test strip USE ONE STRIP TO TEST THREE TIMES A DAY 07/26/21   Andrena Mews T, MD  insulin glargine (LANTUS) 100 UNIT/ML injection Inject 0.15 mLs (15 Units total) into the skin daily. 11/02/21   [name], MD  Insulin Syringe-Needle U-100 (RELION INSULIN SYRINGE) 31G X 15/64" 1 ML MISC USE  AS DIRECTED TID FOR CBG CHECK 08/02/21   [name], MD  isosorbide mononitrate (IMDUR) 60 MG 24 hr tablet Take 1.5 tablets (90 mg total) by mouth daily. 06/04/21   Weaver, Scott T, PA-C  JARDIANCE 10 MG TABS tablet TAKE ONE TABLET BY MOUTH DAILY 08/31/21   [name], MD  lisinopril-hydrochlorothiazide (ZESTORETIC) 20-12.5 MG tablet TAKE TWO TABLETS BY MOUTH DAILY Patient taking differently: Take 1 tablet by mouth daily. 04/09/21   [name], MD  metFORMIN (GLUCOPHAGE-XR) 500 MG 24 hr tablet TAKE TWO TABLETS BY MOUTH TWICE A DAY Patient taking differently: Take 1,000 mg by mouth 2 (two) times daily. 11/23/20   [name], MD  Multiple Vitamin (MULTIVITAMIN WITH MINERALS) TABS tablet Take 1 tablet by mouth daily. Reported on 04/02/2016    [provider]  triamcinolone ointment (KENALOG) 0.5 % Apply 1 application topically 2 (two) times  daily. 09/19/21   Lattie Haw, MD    Family History Family History  Problem Relation Age of Onset   Diabetes Mother        Deceased- hypoglycemic coma    Hypertension Mother    Other Father        unaware of his health history - died when pt was 80 yr old.   Heart attack Sister    Diabetes Mellitus II Brother    Heart attack Brother    Heart attack Brother    Diabetes Mellitus II Brother    Epilepsy Brother    Breast cancer Daughter     Social History Social  History   Tobacco Use   Smoking status: Former    Types: Cigarettes    Quit date: 12/23/1980    Years since quitting: 40.9   Smokeless tobacco: Former    Types: Chew    Quit date: 08/23/1994  Vaping Use   Vaping Use: Never used  Substance Use Topics   Alcohol use: No   Drug use: No     Allergies   Codeine, Sulfamethoxazole-trimethoprim, and Tramadol   Review of Systems Review of Systems  Constitutional:  Negative for chills and fever.  Musculoskeletal:  Positive for arthralgias and joint swelling.  Skin:  Negative for color change and wound.    Physical Exam Triage Vital Signs ED Triage Vitals  Enc Vitals Group     BP 12/09/21 1036 (!) 181/63     Pulse Rate 12/09/21 1036 74     Resp 12/09/21 1036 18     Temp 12/09/21 1036 98.6 F (37 C)     Temp Source 12/09/21 1036 Oral     SpO2 12/09/21 1036 (!) 74 %     Weight --      Height --      Head Circumference --      Peak Flow --      Pain Score 12/09/21 1033 10     Pain Loc --      Pain Edu? --      Excl. in [place]? --    No data found.  Updated Vital Signs BP (!) 181/63 (BP Location: Right Arm) Comment: has not had medicines today   Pulse 74    Temp 98.6 F (37 C) (Oral)    Resp 18    LMP 11/24/1992    SpO2 99%   Visual Acuity Right Eye Distance:   Left Eye Distance:   Bilateral Distance:    Right Eye Near:   Left Eye Near:    Bilateral Near:     Physical Exam Vitals and nursing note reviewed.  Constitutional:      Appearance: Normal  appearance. She is well-developed.  HENT:     Head: Normocephalic and atraumatic.  Cardiovascular:     Rate and Rhythm: Normal rate and regular rhythm.     Pulses:          Radial pulses are 2+ on the left side.  Pulmonary:     Effort: Pulmonary effort is normal.  Musculoskeletal:        General: Swelling and tenderness present.     Cervical back: Normal range of motion.     Comments: Left hand: mild edema over 1st metatarsal. Tenderness. Limited ROM at 1st MCP joint. 4/5 grip strength Left hand compared to Right.  Left wrist: no edema, deformity or tenderness. Pain radiates into wrist from hand when thumb moved.   Skin:    General: Skin is warm and dry.     Capillary Refill: Capillary refill takes less than 2 seconds.     Findings: No bruising or erythema.  Neurological:     Mental Status: She is alert and oriented to person, place, and time.  Psychiatric:        Behavior: Behavior normal.     UC Treatments / Results  Labs (all labs ordered are listed, but only abnormal results are displayed) Labs Reviewed - No data to display  EKG   Radiology DG Hand Complete Left  Result Date: 12/09/2021 CLINICAL DATA:  Base of thumb swelling and pain with limited range of motion. EXAM: LEFT  HAND - COMPLETE 3+ VIEW COMPARISON:  None. FINDINGS: Degenerative spurring and subchondral cystic change at the narrowed first [facility] joint. No acute fracture or erosion. Generalized osteopenia. Generalized interphalangeal joint narrowing greatest at the distal interphalangeal joints of the index and middle fingers. Arterial calcification. IMPRESSION: 1. No acute finding. 2. Osteoarthritis greatest at the symptomatic thumb base. Electronically Signed   By: Jorje Guild M.D.   On: 12/09/2021 11:20    Procedures Procedures (including critical care time)  Medications Ordered in UC Medications - No data to display  Initial Impression / Assessment and Plan / UC Course  I have reviewed the triage vital  signs and the nursing notes.  Pertinent labs & imaging results that were available during my care of the patient were reviewed by me and considered in my medical decision making (see chart for details).     Discussed imaging with pt No evidence of infection at this time. Suspect flair of OA Wrist splint applied for comfort Small course of vicodin prescribed. Has had in the past w/o difficulty.  Discussed home care, f/u with PCP and orthopedist AVS given  Final Clinical Impressions(s) / UC Diagnoses   Final diagnoses:  Left hand pain  Localized swelling on left hand  Osteoarthritis of left thumb  Uncontrolled hypertension     Discharge Instructions       Norco/Vicodin (hydrocodone-acetaminophen) is a narcotic pain medication, do not combine these medications with others containing tylenol. While taking, do not drink alcohol, drive, or perform any other activities that requires focus while taking these medications.   You may wear the brace as needed to help with pain and discomfort. You can remove the brace to apply a cool or warm pack 2-3 times daily to help with discomfort.    Follow up with primary care for blood pressure monitoring and recheck of your hand if not improving in 1-2 weeks. You may also try calling to schedule a follow up appointment with [facility] for further evaluation and treatment of your hand pain and swelling if not improving.      ED Prescriptions     Medication Sig Dispense Auth. Provider   HYDROcodone-acetaminophen (NORCO/VICODIN) 5-325 MG tablet Take 1 tablet by mouth every 6 (six) hours as needed. 6 tablet Noe Gens, [place]      I have reviewed the PDMP during this encounter.   Noe Gens, [place] 12/09/21 1945

## 2021-12-19 ENCOUNTER — Ambulatory Visit: Payer: Medicare PPO

## 2021-12-19 NOTE — Chronic Care Management (AMB) (Signed)
Chronic Care Management   CCM RN Visit Note  12/19/2021 Name: [name] MRN: [id number] DOB: 1941/08/16  Subjective: [name] is a 80 y.o. year old female who is a primary care patient of [name], MD. The care management team was consulted for assistance with disease management and care coordination needs.    Engaged with patient by telephone for follow up visit in response to provider referral for case management and/or care coordination services.   Consent to Services:  The patient was given information about Chronic Care Management services, agreed to services, and gave verbal consent prior to initiation of services.  Please see initial visit note for detailed documentation.   Patient agreed to services and verbal consent obtained.    Assessment: The patient is making progress with her chronic conditions. Patient is currently experiencing difficulty with her left hand and being able to grip anything with it... See Care Plan below for interventions and patient self-care actives. Follow up Plan: Patient would like continued follow-up.  CCM RNCM will outreach the patient within the next 6 weeks.  Patient will call office if needed prior to next encounter  Review of patient past medical history, allergies, medications, health status, including review of consultants reports, laboratory and other test data, was performed as part of comprehensive evaluation and provision of chronic care management services.   SDOH (Social Determinants of Health) assessments and interventions performed:    CCM Care Plan  Allergies  Allergen Reactions   Codeine Nausea And Vomiting   Sulfamethoxazole-Trimethoprim Nausea And Vomiting   Tramadol Nausea And Vomiting    Outpatient Encounter Medications as of 12/19/2021  Medication Sig   Accu-Chek FastClix Lancets MISC USE ONE LANCET TO TEST THREE TIMES A DAY   aspirin 81 MG chewable tablet Chew 81 mg by mouth daily.    atorvastatin (LIPITOR) 20 MG tablet TAKE ONE TABLET BY MOUTH DAILY AT 6PM   Blood Glucose Monitoring Suppl (ACCU-CHEK GUIDE) w/Device KIT USE AS DIRECTED IN THE MORNING, AT NOON AND AT BEDTIME (Patient taking differently: in the morning, at noon, and at bedtime.)   carvedilol (COREG) 25 MG tablet TAKE ONE TABLET BY MOUTH TWICE A DAY WITH MEALS (Patient taking differently: Take 25 mg by mouth 2 (two) times daily with a meal.)   ferrous sulfate 325 (65 FE) MG tablet Take 325 mg by mouth daily with breakfast.   glucose blood (ACCU-CHEK GUIDE) test strip USE ONE STRIP TO TEST THREE TIMES A DAY   HYDROcodone-acetaminophen (NORCO/VICODIN) 5-325 MG tablet Take 1 tablet by mouth every 6 (six) hours as needed.   insulin glargine (LANTUS) 100 UNIT/ML injection Inject 0.15 mLs (15 Units total) into the skin daily.   Insulin Syringe-Needle U-100 (RELION INSULIN SYRINGE) 31G X 15/64" 1 ML MISC USE  AS DIRECTED TID FOR CBG CHECK   isosorbide mononitrate (IMDUR) 60 MG 24 hr tablet Take 1.5 tablets (90 mg total) by mouth daily.   JARDIANCE 10 MG TABS tablet TAKE ONE TABLET BY MOUTH DAILY   lisinopril-hydrochlorothiazide (ZESTORETIC) 20-12.5 MG tablet TAKE TWO TABLETS BY MOUTH DAILY (Patient taking differently: Take 1 tablet by mouth daily.)   metFORMIN (GLUCOPHAGE-XR) 500 MG 24 hr tablet TAKE TWO TABLETS BY MOUTH TWICE A DAY (Patient taking differently: Take 1,000 mg by mouth 2 (two) times daily.)   Multiple Vitamin (MULTIVITAMIN WITH MINERALS) TABS tablet Take 1 tablet by mouth daily. Reported on 04/02/2016   triamcinolone ointment (KENALOG) 0.5 % Apply 1 application topically 2 (two)  times daily.   No facility-administered encounter medications on file as of 12/19/2021.    Patient Active Problem List   Diagnosis Date Noted   Shoulder pain 11/02/2021   Skin lesion of breast 09/23/2021   Inclusion cyst 04/03/2021   Arthritis of ankle, right 11/15/2019   Stage 3b chronic kidney disease (CKD) ([place]) 11/15/2019    History of MI (myocardial infarction) 06/18/2019   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   Iron deficiency anemia 02/25/2019   Thrombocytopenia ([place]) 02/09/2019   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 08/29/2017   Nodule of right lung 08/29/2017   Aortic arch atherosclerosis ([place]) 08/29/2017   Liver cyst    Left thyroid nodule 07/27/2014   History of thyroid cancer 04/12/2013   Diabetes mellitus type 2, controlled ([place]) 02/19/2007   HYPERCHOLESTEROLEMIA 02/19/2007   Anemia of chronic disease 02/19/2007   Resistant hypertension 02/19/2007   Coronary artery disease involving native coronary artery of native heart without angina pectoris 02/19/2007    Conditions to be addressed/monitored:HTN and DMII  Care Plan : RN Case Manager  Updates made by Lazaro Arms, RN since 12/19/2021 12:00 AM     Problem: Hypertension and Diabetes      Long-Range Goal: Monitor HTN and DM II   Start Date: 11/17/2019  Expected End Date: 02/19/2022  Recent Progress: On track  Priority: Medium  Note:    Current Barriers:  Chronic Disease Management support, education, and care coordination needs related to HTN and DMII  Clinical Goal(s) related to HTN and DMII:  Over the next 90 days, patient will:  Work with the care management team to address educational, disease management, and care coordination needs  Begin or continue self health monitoring activities as directed today Measure and record cbg (blood glucose) as prescribed times daily, Measure and record blood pressure  Call provider office for new or worsened signs and symptoms  Call care management team with questions or concerns Verbalize basic understanding of patient centered plan of care established today  Interventions: 1:1 collaboration with primary care provider regarding development and update of comprehensive plan of care as evidenced by provider attestation and co-signature Inter-disciplinary  care team collaboration (see longitudinal plan of care) Evaluation of current treatment plan related to  self management and patient's adherence to plan as established by provider   Hypertension: (Status: Goal on Track (progressing): YES.) Long Term Goal  Last practice recorded BP readings:  BP Readings from Last 3 Encounters:  12/09/21 (!) 181/63  11/20/21 (!) 162/60  11/20/21 (!) 130/50  Most recent eGFR/CrCl:  Lab Results  Component Value Date   EGFR 50 (L) 11/20/2021    No components found for: CRCL  reduction of dietary sodium encouraged blood pressure trends reviewed-  home or ambulatory blood pressure monitoring encouraged   Diabetes Interventions:  (Status:  Goal on track:  Yes.) Long Term Goal Assessed patient's understanding of A1c goal: <7%  Lab Results  Component Value Date   HGBA1C 6.9 11/02/2021  Provided patient with written and verbal  educational materials related to hypo and hyperglycemia and importance of correct treatment Review of patient status, including review of consultants reports, relevant laboratory and other test results, and medications completed. blood glucose monitoring encouraged blood glucose readings reviewed-  12/19/21: I spoke with [name], and she is doing well. She did not check her blood pressure or blood sugar this morning, but yesterday her blood sugar was 101, and her blood pressure was 131/60. She denies  any headache, chest pain, or flushing. She was seen in Urgent Care on 12/09/21 for pain in her left thumb and swollen hand. At that time, her elevated blood pressure was  181/63. She states that her left hand is better, with no swelling or pain, just sore, but she cannot grasp things with that hand. She has a follow-up appointment with Dr. Gwendlyn Deutscher on 12/26/21 for a blood pressure follow-up.  Patient Goals/Self Care Activities: -Patient/Caregiver will self-administer medications as prescribed as evidenced by self-report/primary caregiver  report ,  -Patient/Caregiver will attend all scheduled provider appointments as evidenced by clinician review of documented attendance to scheduled appointments and patient/caregiver report,  -Patient/Caregiver will call pharmacy for medication refills as evidenced by patient report and review of pharmacy fill history as appropriate,  -Patient/Caregiver will call provider office for new concerns or questions as evidenced by review of documented incoming telephone call notes and patient report,  Patient/Caregiver verbalizes understanding of plan,  -Patient/Caregiver will focus on medication adherence by taking medications as prescribed -Calls provider office for new concerns, questions, or BP outside discussed parameters -Checks BP and records as discussed -Follows a low sodium diet/DASH diet -check blood sugar at prescribed times -check blood sugar if I feel it is too high or too low -take the blood sugar meter to all doctor visits         Lazaro Arms RN, BSN, [place] Phone: [phone] I Fax: [phone]

## 2021-12-19 NOTE — Patient Instructions (Signed)
Visit Information  Ms. Trembath  it was nice speaking with you. Please call me directly [phone] if you have questions about the goals we discussed.    Patient Goals/Self Care Activities: -Patient/Caregiver will self-administer medications as prescribed as evidenced by self-report/primary caregiver report ,  -Patient/Caregiver will attend all scheduled provider appointments as evidenced by clinician review of documented attendance to scheduled appointments and patient/caregiver report,  -Patient/Caregiver will call pharmacy for medication refills as evidenced by patient report and review of pharmacy fill history as appropriate,  -Patient/Caregiver will call provider office for new concerns or questions as evidenced by review of documented incoming telephone call notes and patient report,  Patient/Caregiver verbalizes understanding of plan,  -Patient/Caregiver will focus on medication adherence by taking medications as prescribed -Calls provider office for new concerns, questions, or BP outside discussed parameters -Checks BP and records as discussed -Follows a low sodium diet/DASH diet -check blood sugar at prescribed times -check blood sugar if I feel it is too high or too low -take the blood sugar meter to all doctor visits        The patient verbalized understanding of instructions, educational materials, and care plan provided today and declined offer to receive copy of patient instructions, educational materials, and care plan.   Follow up Plan: Patient would like continued follow-up.  CCM RNCM will outreach the patient within the next 6 weeks.  Patient will call office if needed prior to next encounter  Lazaro Arms, RN  [phone]

## 2021-12-26 ENCOUNTER — Ambulatory Visit: Payer: Medicare PPO | Admitting: Family Medicine

## 2021-12-26 ENCOUNTER — Encounter: Payer: Self-pay | Admitting: Family Medicine

## 2021-12-26 ENCOUNTER — Other Ambulatory Visit: Payer: Self-pay

## 2021-12-26 DIAGNOSIS — L720 Epidermal cyst: Secondary | ICD-10-CM | POA: Diagnosis not present

## 2021-12-26 DIAGNOSIS — I10 Essential (primary) hypertension: Secondary | ICD-10-CM

## 2021-12-26 DIAGNOSIS — N1832 Chronic kidney disease, stage 3b: Secondary | ICD-10-CM

## 2021-12-26 MED ORDER — LISINOPRIL-HYDROCHLOROTHIAZIDE 20-12.5 MG PO TABS: 1.0000 | ORAL_TABLET | Freq: Every day | ORAL | 1 refills | Status: DC

## 2021-12-26 NOTE — Assessment & Plan Note (Signed)
I gave her [place] office number to call and also messaged our referral specialist regarding referral update.

## 2021-12-26 NOTE — Addendum Note (Signed)
Addended by: Andrena Mews T on: 12/26/2021 11:58 AM   Modules accepted: Level of Service

## 2021-12-26 NOTE — Progress Notes (Signed)
° ° °  SUBJECTIVE:   CHIEF COMPLAINT / HPI:   HTN: She is here for a BP check-up. She is compliant with Coreg 25 mg BID and Zestoretic 20/12.5 mg, one tab daily. No new concerns. She feels well in general.  Back cyst: The cyst on her back irritates once in a while. She wants to get it checked.  CKD: She is yet to hear back regarding her nephrology referral appointment.  PERTINENT  PMH / PSH: PMHx reviewed.  OBJECTIVE:   BP 108/60    Pulse 74    Ht 5\' 2"  (1.575 m)    Wt 140 lb (63.5 kg)    LMP 11/24/1992    SpO2 100%    BMI 25.61 kg/m   Physical Exam Vitals reviewed.  Cardiovascular:     Rate and Rhythm: Normal rate and regular rhythm.     Heart sounds: Normal heart sounds.  Pulmonary:     Effort: Pulmonary effort is normal. No respiratory distress.     Breath sounds: Normal breath sounds.  Skin:    Comments: Small 2 cm by 2 cm inclusion cyst on her mid upper back and a smaller one on her right upper back.  Neurological:     General: No focal deficit present.      ASSESSMENT/PLAN:   Resistant hypertension BP looks good on her current regimen. No dose adjustment needed.  Med refilled  Inclusion cyst Schedule derm appointment for removal. I have messaged our referral specialist to help with Derm clinic appointment. Referral placed  Stage 3b chronic kidney disease (CKD) ([place]) I gave her [place] office number to call and also messaged our referral specialist regarding referral update.    At the end of the visit, she mentioned intermittent diarrhea - currently asymptomatic. She feels it is due to diverticulosis.  Fiber diet discussed. F/U soon to assess if worsening. She agreed with the plan.  Andrena Mews, MD [place]

## 2021-12-26 NOTE — Assessment & Plan Note (Signed)
Schedule derm appointment for removal. I have messaged our referral specialist to help with Derm clinic appointment.

## 2021-12-26 NOTE — Assessment & Plan Note (Addendum)
BP looks good on her current regimen. No dose adjustment needed.  Med refilled.

## 2021-12-26 NOTE — Patient Instructions (Signed)
Epidermoid Cyst An epidermoid cyst, also called an epidermal cyst, is a small lump under your skin. The cyst contains a substance called keratin. Do not try to pop or open the cyst yourself. What are the causes? A blocked hair follicle. A hair that curls and re-enters the skin instead of growing straight out of the skin. A blocked pore. Irritated skin. An injury to the skin. Certain conditions that are passed along from parent to child. Human papillomavirus (HPV). This happens rarely when cysts occur on the bottom of the feet. Long-term sun damage to the skin. What increases the risk? Having acne. Being female. Having an injury to the skin. Being past puberty. Having certain conditions caused by genes (genetic disorder) What are the signs or symptoms? These cysts are usually harmless, but they can get infected. Symptoms of infection may include: Redness. Inflammation. Tenderness. Warmth. Fever. A bad-smelling substance that drains from the cyst. Pus that drains from the cyst. How is this treated? In many cases, epidermoid cysts go away on their own without treatment. If a cyst becomes infected, treatment may include: Opening and draining the cyst, done by a doctor. After draining, you may need minor surgery to remove the rest of the cyst. Antibiotic medicine. Shots of medicines (steroids) that help to reduce inflammation. Surgery to remove the cyst. Surgery may be done if the cyst: Becomes large. Bothers you. Has a chance of turning into cancer. Do not try to open a cyst yourself. Follow these instructions at home: Medicines Take over-the-counter and prescription medicines as told by your doctor. If you were prescribed an antibiotic medicine, take it as told by your doctor. Do not stop taking it even if you start to feel better. General instructions Keep the area around your cyst clean and dry. Wear loose, dry clothing. Avoid touching your cyst. Check your cyst every day for  signs of infection. Check for: Redness, swelling, or pain. Fluid or blood. Warmth. Pus or a bad smell. Keep all follow-up visits. How is this prevented? Wear clean, dry, clothing. Avoid wearing tight clothing. Keep your skin clean and dry. Take showers or baths every day. Contact a doctor if: Your cyst has symptoms of infection. Your condition does not improve or gets worse. You have a cyst that looks different from other cysts you have had. You have a fever. Get help right away if: Redness spreads from the cyst into the area close by. Summary An epidermoid cyst is a small lump under your skin. If a cyst becomes infected, treatment may include surgery to open and drain the cyst, or to remove it. Take over-the-counter and prescription medicines only as told by your doctor. Contact a doctor if your condition is not improving or is getting worse. Keep all follow-up visits. This information is not intended to replace advice given to you by your health care provider. Make sure you discuss any questions you have with your health care provider. Document Revised: 03/15/2020 Document Reviewed: 03/15/2020 Elsevier Patient Education  [place].

## 2022-01-03 ENCOUNTER — Other Ambulatory Visit: Payer: Self-pay

## 2022-01-03 ENCOUNTER — Ambulatory Visit: Payer: Medicare PPO | Admitting: Family Medicine

## 2022-01-03 VITALS — BP 105/65 | HR 86 | Ht 62.0 in | Wt 140.0 lb

## 2022-01-03 DIAGNOSIS — L720 Epidermal cyst: Secondary | ICD-10-CM | POA: Diagnosis not present

## 2022-01-03 NOTE — Progress Notes (Signed)
Patient ID: [name], female   DOB: May 10, 1941, 81 y.o.   MRN: [id number] [facility] ATTENDING NOTE Ronette Deter I  have seen and examined this patient, reviewed their chart. I have discussed this patient with the resident. I agree with the resident's findings, assessment and care plan.

## 2022-01-03 NOTE — Patient Instructions (Signed)
Return in 14 days for suture removal. Call sooner if having pain, discharge or redness around the incision site.  Wound Care, Adult Taking care of your wound properly can help to prevent pain, infection, and scarring. It can also help your wound heal more quickly. Follow instructions from your health care provider about how to care for your wound. Supplies needed: Soap and water. Wound cleanser, saline, or germ-free (sterile) water. Gauze. If needed, a clean bandage (dressing) or other type of wound dressing material to cover or place in the wound. Follow your health care provider's instructions about what dressing supplies to use. Cream or topical ointment to apply to the wound, if told by your health care provider. How to care for your wound Cleaning the wound Ask your health care provider how to clean the wound. This may include: Using mild soap and water, a wound cleanser, saline, or sterile water. Using a clean gauze to pat the wound dry after cleaning it. Do not rub or scrub the wound. Dressing care Wash your hands with soap and water for at least 20 seconds before and after you change the dressing. If soap and water are not available, use hand sanitizer. Change your dressing as told by your health care provider. This may include: Cleaning or rinsing out (irrigating) the wound. Application of cream or topical ointment, if told by your health care provider. Placing a dressing over the wound or in the wound (packing). Covering the wound with an outer dressing. Leave stitches (sutures), staples, skin glue, or adhesive strips in place. These skin closures may need to stay in place for 2 weeks or longer. If adhesive strip edges start to loosen and curl up, you may trim the loose edges. Do not remove adhesive strips completely unless your health care provider tells you to do that. Ask your health care provider when you can leave the wound uncovered. Checking for infection Check your wound  area every day for signs of infection. Check for: More redness, swelling, or pain. Fluid or blood. Warmth. Pus or a bad smell.  Follow these instructions at home Medicines If you were prescribed an antibiotic medicine, cream, or ointment, take or apply it as told by your health care provider. Do not stop using the antibiotic even if your condition improves. If you were prescribed pain medicine, take it 30 minutes before you do any wound care or as told by your health care provider. Take over-the-counter and prescription medicines only as told by your health care provider. Eating and drinking Eat a diet that includes protein, vitamin A, vitamin C, and other nutrient-rich foods to help the wound heal. Foods rich in protein include meat, fish, eggs, dairy, beans, and nuts. Foods rich in vitamin A include carrots and dark green, leafy vegetables. Foods rich in vitamin C include citrus fruits, tomatoes, broccoli, and peppers. Drink enough fluid to keep your urine pale yellow. General instructions Do not take baths, swim, or use a hot tub until your health care provider approves. Ask your health care provider if you may take showers. You may only be allowed to take sponge baths. Do not scratch or pick at the wound. Keep it covered as told by your health care provider. Return to your normal activities as told by your health care provider. Ask your health care provider what activities are safe for you. Protect your wound from the sun when you are outside for the first 6 months, or for as long as told by your health  care provider. Cover up the scar area or apply sunscreen that has an SPF of at least 79. Do not use any products that contain nicotine or tobacco. These products include cigarettes, chewing tobacco, and vaping devices, such as e-cigarettes. If you need help quitting, ask your health care provider. Keep all follow-up visits. This is important. Contact a health care provider if: You  received a tetanus shot and you have swelling, severe pain, redness, or bleeding at the injection site. Your pain is not controlled with medicine. You have any of these signs of infection: More redness, swelling, or pain around the wound. Fluid or blood coming from the wound. Warmth coming from the wound. A fever or chills. You are nauseous or you vomit. You are dizzy. You have a new rash or hardness around the wound. Get help right away if: You have a red streak of skin near the area around your wound. Pus or a bad smell coming from the wound. Your wound has been closed with staples, sutures, skin glue, or adhesive strips and it begins to open up and separate. Your wound is bleeding, and the bleeding does not stop with gentle pressure. These symptoms may represent a serious problem that is an emergency. Do not wait to see if the symptoms will go away. Get medical help right away. Call your local emergency services (911 in the [place] Do not drive yourself to the hospital. Summary Always wash your hands with soap and water for at least 20 seconds before and after changing your dressing. Change your dressing as told by your health care provider. To help with healing, eat foods that are rich in protein, vitamin A, vitamin C, and other nutrients. Check your wound every day for signs of infection. Contact your health care provider if you think that your wound is infected. This information is not intended to replace advice given to you by your health care provider. Make sure you discuss any questions you have with your health care provider. Document Revised: 04/17/2021 Document Reviewed: 04/17/2021 Elsevier Patient Education  [place].

## 2022-01-03 NOTE — Progress Notes (Signed)
c 

## 2022-01-03 NOTE — Progress Notes (Signed)
° ° °  SUBJECTIVE:   CHIEF COMPLAINT / HPI:   Cyst Removal Patient presents for removal of 2 cysts on her back. Seen by PCP, Dr. Gwendlyn Deutscher, on 12/26/21 and diagnosed with two inclusion cysts at that time. She reports the larger of the two cysts has been present for ~5 years, and the other smaller cyst has been present for ~1 year. Over the past 3 weeks they have both been more sore/tender and increasingly itchy. No drainage from the area. No fever/chills or other systemic symptoms. No cysts elsewhere on her body. No previous hx of similar problems.  PERTINENT  PMH / PSH: T2DM, CAD s/p MI, anemia, HTN, CKD  OBJECTIVE:   BP 105/65    Pulse 86    Ht 5\' 2"  (1.575 m)    Wt 140 lb (63.5 kg)    LMP 11/24/1992    BMI 25.61 kg/m   Gen: alert, well-appearing, NAD Resp: normal work of breathing Skin: 36mm flesh colored nodule with central punctum on thoracic back, just left of midline. 76mm flesh colored nodule with central punctum adjacent and slightly superior to other cyst as seen below: No increased warmth or surrounding erythema     ASSESSMENT/PLAN:    Inclusion Epidermoid Cyst: Benign with no signs of infection. Excision discussed vs monitoring. Patient opted for removal. Written consent was obtained and copy placed in chart. See procedure note below.    PROCEDURE NOTE  PRE-OP DIAGNOSIS: Inclusion epidermoid cyst  POST-OP DIAGNOSIS: Same   PROCEDURE: skin lesion excision (x2)  Performing Physician: Drs. Elmina Hendel and [employer] Physician (if applicable): Dr. Gwendlyn Deutscher  PROCEDURE:  Excision  The area surrounding the skin lesion was prepared and draped in the  usual sterile manner. The lesion was removed in the usual manner by the biopsy method noted above. Hemostasis was assured.  Closure:  3-0  suture non-absorbable (One stitch for each lesion)  Followup: The patient tolerated the procedure well without complications.  Standard post-procedure care is explained and return   precautions are given. F/U in 10-14 days for suture removal.            Alcus Dad, MD [place]

## 2022-01-17 ENCOUNTER — Other Ambulatory Visit: Payer: Self-pay

## 2022-01-17 ENCOUNTER — Ambulatory Visit (INDEPENDENT_AMBULATORY_CARE_PROVIDER_SITE_OTHER): Payer: Medicare PPO

## 2022-01-17 DIAGNOSIS — Z4802 Encounter for removal of sutures: Secondary | ICD-10-CM

## 2022-01-17 NOTE — Progress Notes (Signed)
Patient presents to nurse clinic for suture removal. Removed first suture from mid upper back in usual fashion. Suture on right upper back required further assistance from Dr. Gwendlyn Deutscher. Pain Ease Mist Spray was used for pain management. Dr. Gwendlyn Deutscher removed second suture from right upper back. Instructed to apply steri strips to area. Care provided. Patient provided with return precautions.   Patient verbalizes understanding.   Talbot Grumbling, RN

## 2022-01-30 ENCOUNTER — Telehealth: Payer: Medicare PPO

## 2022-01-30 DIAGNOSIS — N1831 Chronic kidney disease, stage 3a: Secondary | ICD-10-CM | POA: Diagnosis not present

## 2022-01-30 DIAGNOSIS — E1122 Type 2 diabetes mellitus with diabetic chronic kidney disease: Secondary | ICD-10-CM | POA: Diagnosis not present

## 2022-01-30 DIAGNOSIS — I129 Hypertensive chronic kidney disease with stage 1 through stage 4 chronic kidney disease, or unspecified chronic kidney disease: Secondary | ICD-10-CM | POA: Diagnosis not present

## 2022-01-30 DIAGNOSIS — E785 Hyperlipidemia, unspecified: Secondary | ICD-10-CM | POA: Diagnosis not present

## 2022-01-30 DIAGNOSIS — I2510 Atherosclerotic heart disease of native coronary artery without angina pectoris: Secondary | ICD-10-CM | POA: Diagnosis not present

## 2022-01-30 DIAGNOSIS — N189 Chronic kidney disease, unspecified: Secondary | ICD-10-CM | POA: Diagnosis not present

## 2022-01-30 DIAGNOSIS — N390 Urinary tract infection, site not specified: Secondary | ICD-10-CM | POA: Diagnosis not present

## 2022-01-31 ENCOUNTER — Telehealth: Payer: Self-pay

## 2022-01-31 DIAGNOSIS — Z961 Presence of intraocular lens: Secondary | ICD-10-CM | POA: Diagnosis not present

## 2022-01-31 DIAGNOSIS — H5213 Myopia, bilateral: Secondary | ICD-10-CM | POA: Diagnosis not present

## 2022-01-31 DIAGNOSIS — E119 Type 2 diabetes mellitus without complications: Secondary | ICD-10-CM | POA: Diagnosis not present

## 2022-01-31 DIAGNOSIS — H52203 Unspecified astigmatism, bilateral: Secondary | ICD-10-CM | POA: Diagnosis not present

## 2022-01-31 DIAGNOSIS — H524 Presbyopia: Secondary | ICD-10-CM | POA: Diagnosis not present

## 2022-01-31 DIAGNOSIS — N1831 Chronic kidney disease, stage 3a: Secondary | ICD-10-CM | POA: Diagnosis not present

## 2022-01-31 DIAGNOSIS — Z7984 Long term (current) use of oral hypoglycemic drugs: Secondary | ICD-10-CM | POA: Diagnosis not present

## 2022-01-31 NOTE — Telephone Encounter (Signed)
° °  RN Case Manager Care Management   Phone Outreach    01/31/2022 Name: [name] MRN: [id number] DOB: 1941-11-30  [name] is a 81 y.o. year old female who is a primary care patient of [place], Phill Myron, MD .   Telephone outreach was unsuccessful A HIPPA compliant phone message was left for the patient providing contact information and requesting a return call.   Follow Up Plan: Will route chart to Care Guide to see if patient would like to reschedule phone appointment.    Review of patient status, including review of consultants reports, relevant laboratory and other test results, and collaboration with appropriate care team members and the patient's provider was performed as part of comprehensive patient evaluation and provision of care management services.    Lazaro Arms RN, BSN, [facility] Care Management Coordinator [place] Phone: [phone] Fax: [phone]

## 2022-02-13 ENCOUNTER — Telehealth: Payer: Self-pay | Admitting: *Deleted

## 2022-02-13 NOTE — Chronic Care Management (AMB) (Signed)
°  Care Management   Note  02/13/2022 Name: [name] MRN: [id number] DOB: 05-Apr-1941  [name] is a 81 y.o. year old female who is a primary care patient of [name], MD and is actively engaged with the care management team. I reached out to [name] by phone today to assist with re-scheduling a follow up visit with the RN Case Manager  Follow up plan: Unsuccessful telephone outreach attempt made. A HIPAA compliant phone message was left for the patient providing contact information and requesting a return call.  The care management team will reach out to the patient again over the next 7 days.  If patient returns call to provider office, please advise to call [place] at [phone].  [place] Management  Direct Dial: [phone]

## 2022-02-13 NOTE — Chronic Care Management (AMB) (Signed)
°  Care Management   Note  02/13/2022 Name: [name] MRN: [id number] DOB: September 20, 1941  [name] is a 81 y.o. year old female who is a primary care patient of [name], MD and is actively engaged with the care management team. I reached out to [name] by phone today to assist with re-scheduling a follow up visit with the RN Case Manager  Follow up plan: Telephone appointment with care management team member scheduled for:02/18/22  [place] Management  Direct Dial: [phone]

## 2022-02-18 ENCOUNTER — Ambulatory Visit: Payer: Medicare PPO

## 2022-02-18 NOTE — Chronic Care Management (AMB) (Signed)
Chronic Care Management   CCM RN Visit Note  02/18/2022 Name: [name] MRN: [id number] DOB: 11-Mar-1941  Subjective: [name] is a 81 y.o. year old female who is a primary care patient of [name], MD. The care management team was consulted for assistance with disease management and care coordination needs.    Engaged with patient by telephone for follow up visit in response to provider referral for case management and/or care coordination services.   Consent to Services:  The patient was given information about Chronic Care Management services, agreed to services, and gave verbal consent prior to initiation of services.  Please see initial visit note for detailed documentation.   Patient agreed to services and verbal consent obtained.    Summary: The patient is making significant efforts toward positive changes in her chronic conditions.. See Care Plan below for interventions and patient self-care actives.  Recommendation: The patient may benefit from continuing to check and record her values, take her medications as prescribed, attend all appointments and stay active. The patient agrees.  Follow up Plan: Patient would like continued follow-up.  CCM RNCM will outreach the patient within the next 5 weeks.  Patient will call office if needed prior to next encounter   Assessment: Review of patient past medical history, allergies, medications, health status, including review of consultants reports, laboratory and other test data, was performed as part of comprehensive evaluation and provision of chronic care management services.   SDOH (Social Determinants of Health) assessments and interventions performed:  No   Conditions to be addressed/monitored:HTN and DMII  Care Plan : RN Case Manager  Updates made by Lazaro Arms, RN since 02/18/2022 12:00 AM     Problem: Hypertension and Diabetes      Long-Range Goal: Monitor HTN and DM II   Start Date: 11/17/2019   Expected End Date: 02/19/2022  Recent Progress: On track  Priority: Medium  Note:    Current Barriers:  Chronic Disease Management support, education, and care coordination needs related to HTN and DMII  Clinical Goal(s) related to HTN and DMII:  Over the next 90 days, patient will:  Work with the care management team to address educational, disease management, and care coordination needs  Begin or continue self health monitoring activities as directed today Measure and record cbg (blood glucose) as prescribed times daily, Measure and record blood pressure   Interventions: 1:1 collaboration with primary care provider regarding development and update of comprehensive plan of care as evidenced by provider attestation and co-signature Inter-disciplinary care team collaboration (see longitudinal plan of care) Evaluation of current treatment plan related to  self management and patient's adherence to plan as established by provider   Hypertension: (Status: Goal on Track (progressing): YES.) Long Term Goal  Last practice recorded BP readings:  BP Readings from Last 3 Encounters:  12/09/21 (!) 181/63  11/20/21 (!) 162/60  11/20/21 (!) 130/50  Most recent eGFR/CrCl:  Lab Results  Component Value Date   EGFR 50 (L) 11/20/2021    No components found for: CRCL  reduction of dietary sodium encouraged blood pressure trends reviewed-  home or ambulatory blood pressure monitoring encouraged   Diabetes Interventions:  (Status:  Goal on track:  Yes.) Long Term Goal Assessed patient's understanding of A1c goal: <7%  Lab Results  Component Value Date   HGBA1C 6.9 11/02/2021  Review of patient status, including review of consultants reports, relevant laboratory and other test results, and medications completed. blood glucose monitoring encouraged  blood glucose readings reviewed-  02/18/22: [name] said, " I am doing [place] ."She said her blood sugars ranged between 89-117 and blood  pressure 128/60 HR 63- 140/75 HR 67. Her appetite has always been finicky, but she eats as much as possible. She stays active in her home.  Patient Goals/Self Care Activities: -Patient/Caregiver will self-administer medications as prescribed as evidenced by self-report/primary caregiver report ,  -Patient/Caregiver will attend all scheduled provider appointments as evidenced by clinician review of documented attendance to scheduled appointments and patient/caregiver report,  -Patient/Caregiver will call pharmacy for medication refills as evidenced by patient report and review of pharmacy fill history as appropriate,  -Patient/Caregiver will call provider office for new concerns or questions as evidenced by review of documented incoming telephone call notes and patient report,  Patient/Caregiver verbalizes understanding of plan,  -Patient/Caregiver will focus on medication adherence by taking medications as prescribed -Calls provider office for new concerns, questions, or BP outside discussed parameters -Checks BP and records as discussed -Follows a low sodium diet/DASH diet -check blood sugar at prescribed times -check blood sugar if I feel it is too high or too low -take the blood sugar meter to all doctor visits       Lazaro Arms RN, BSN, [place]  Phone: [phone]

## 2022-02-18 NOTE — Patient Instructions (Signed)
Visit Information  Ms. Gubler  it was nice speaking with you. Please call me directly [phone] if you have questions about the goals we discussed.  Patient Goals/Self Care Activities: -Patient/Caregiver will self-administer medications as prescribed as evidenced by self-report/primary caregiver report ,  -Patient/Caregiver will attend all scheduled provider appointments as evidenced by clinician review of documented attendance to scheduled appointments and patient/caregiver report,  -Patient/Caregiver will call pharmacy for medication refills as evidenced by patient report and review of pharmacy fill history as appropriate,  -Patient/Caregiver will call provider office for new concerns or questions as evidenced by review of documented incoming telephone call notes and patient report,  Patient/Caregiver verbalizes understanding of plan,  -Patient/Caregiver will focus on medication adherence by taking medications as prescribed -Calls provider office for new concerns, questions, or BP outside discussed parameters -Checks BP and records as discussed -Follows a low sodium diet/DASH diet -check blood sugar at prescribed times -check blood sugar if I feel it is too high or too low -take the blood sugar meter to all doctor visits    The patient verbalized understanding of instructions, educational materials, and care plan provided today and agreed to receive a mailed copy of patient instructions, educational materials, and care plan.   Follow up Plan: Patient would like continued follow-up.  CCM RNCM will outreach the patient within the next 5 weeks.  Patient will call office if needed prior to next encounter  Lazaro Arms, RN  [phone]

## 2022-02-20 ENCOUNTER — Other Ambulatory Visit: Payer: Self-pay | Admitting: Family Medicine

## 2022-02-20 DIAGNOSIS — Z1231 Encounter for screening mammogram for malignant neoplasm of breast: Secondary | ICD-10-CM

## 2022-02-27 ENCOUNTER — Other Ambulatory Visit: Payer: Self-pay | Admitting: Family Medicine

## 2022-03-06 ENCOUNTER — Ambulatory Visit: Payer: Medicare PPO

## 2022-03-07 ENCOUNTER — Telehealth: Payer: Self-pay | Admitting: Family Medicine

## 2022-03-07 NOTE — Chronic Care Management (AMB) (Signed)
?Chronic Care Management  ? ?CCM RN Visit Note ? ?03/07/2022 ?Name: [name] MRN: [id number] DOB: 1941-02-15 ? ?Subjective: ?[name] is a 81 y.o. year old female who is a primary care patient of Eniola, Phill Myron, MD. The care management team was consulted for assistance with disease management and care coordination needs.   ? ?Engaged with patient by telephone for follow up visit in response to provider referral for case management and/or care coordination services.  ? ?Consent to Services:  ?The patient was given information about Chronic Care Management services, agreed to services, and gave verbal consent prior to initiation of services.  Please see initial visit note for detailed documentation.  ? ?Patient agreed to services and verbal consent obtained.  ? ?Summary: The patient is making significant efforts toward positive changes in her chronic conditions.[name] states that she only takes one of her metformin at night due to her low readings in the morning because she is afraid it may be even lower the next morning.. See Care Plan below for interventions and patient self-care actives. ? ?Recommendation: The patient may benefit from Monitor food intake, Check Blood sugar and record values, Check blood pressures and record values, and Inform Dr Gwendlyn Deutscher regarding your Metformin does ? ?Follow up Plan: Patient would like continued follow-up.  CCM RNCM will outreach the patient within the next 5 weeks.  Patient will call office if needed prior to next encounter ? ? ?Assessment: Review of patient past medical history, allergies, medications, health status, including review of consultants reports, laboratory and other test data, was performed as part of comprehensive evaluation and provision of chronic care management services.  ? ?SDOH (Social Determinants of Health) assessments and interventions performed:  No ? ?CCM Care Plan ? ? ? ? ?Conditions to be addressed/monitored:HTN and DMII ? ?Care  Plan : RN Case Manager  ?Updates made by Lazaro Arms, RN since 03/07/2022 12:00 AM  ?  ? ?Problem: Hypertension and Diabetes   ?  ? ?Long-Range Goal: Monitor HTN and DM II   ?Start Date: 11/17/2019  ?Expected End Date: 05/22/2022  ?Recent Progress: On track  ?Priority: Medium  ?Note:   ? ?Current Barriers:  ?Chronic Disease Management support, education, and care coordination needs related to HTN and DMII ? ?Clinical Goal(s) related to HTN and DMII:  ?Over the next 90 days, patient will:  ?Work with the care management team to address educational, disease management, and care coordination needs  ?Begin or continue self health monitoring activities as directed today Measure and record cbg (blood glucose) as prescribed times daily, Measure and record blood pressure  ? ?Interventions: ?1:1 collaboration with primary care provider regarding development and update of comprehensive plan of care as evidenced by provider attestation and co-signature ?Inter-disciplinary care team collaboration (see longitudinal plan of care) ?Evaluation of current treatment plan related to  self management and patient's adherence to plan as established by provider ? ? ?Hypertension: (Status: Goal on Track (progressing): YES.) Long Term Goal  ?Last practice recorded BP readings:  ?BP Readings from Last 3 Encounters:  ?01/03/22 105/65  ?12/26/21 108/60  ?12/09/21 (!) 181/63  ?Most recent eGFR/CrCl:  ?Lab Results  ?Component Value Date  ? EGFR 50 (L) 11/20/2021  ?  No components found for: CRCL ? ?reduction of dietary sodium encouraged ?blood pressure trends reviewed-  ?home or ambulatory blood pressure monitoring encouraged ? ? ?Diabetes Interventions:  (Status:  Goal on track:  Yes.) Long Term Goal ?Assessed patient's understanding of A1c  goal: <7% ? ?Lab Results  ?Component Value Date  ? HGBA1C 6.9 11/02/2021  ?Review of patient status, including review of consultants reports, relevant laboratory and other test results, and medications  completed. ?blood glucose monitoring encouraged ?blood glucose readings reviewed-  ?03/06/22: [name]'s FBS sugars have been ranging between 88-110. She takes one Metformin at night because she feels that if she takes two, she may drop too low the next day. She has always had issues with eating a lot of food, so trying to add an extra snack at night would be hard for her. I advised her to inform Dr. Gwendlyn Deutscher that she only takes one pill at night. She verbalized understanding. ?Her blood pressure ranges from 115/55 to 145/55. She denies any dizziness, lightheadedness, weakness, or confusion. She continues to stay very active with her family and at home. ? ? ?Patient Goals/Self Care Activities: ?-Patient/Caregiver will self-administer medications as prescribed as evidenced by self-report/primary caregiver report ,  ?-Patient/Caregiver will attend all scheduled provider appointments as evidenced by clinician review of documented attendance to scheduled appointments and patient/caregiver report,  ?-Patient/Caregiver will call pharmacy for medication refills as evidenced by patient report and review of pharmacy fill history as appropriate,  ?-Patient/Caregiver will call provider office for new concerns or questions as evidenced by review of documented incoming telephone call notes and patient report,  ?Patient/Caregiver verbalizes understanding of plan,  ?-Patient/Caregiver will focus on medication adherence by taking medications as prescribed ?-Calls provider office for new concerns, questions, or BP outside discussed parameters ?-Checks BP and records as discussed ?-Follows a low sodium diet/DASH diet ?-check blood sugar at prescribed times ?-check blood sugar if I feel it is too high or too low ?-take the blood sugar meter to all doctor visits  ? ? ? ?  ? ?Lazaro Arms RN, BSN, [place] ?Care Management Coordinator ?[place]  ?Phone: [phone]  ?  ? ? ? ? ? ?

## 2022-03-07 NOTE — Patient Instructions (Signed)
Visit Information ? ?[name]  it was nice speaking with you. Please call me directly [phone] if you have questions about the goals we discussed. ? ?Patient Goals/Self Care Activities: ?-Patient/Caregiver will self-administer medications as prescribed as evidenced by self-report/primary caregiver report ,  ?-Patient/Caregiver will attend all scheduled provider appointments as evidenced by clinician review of documented attendance to scheduled appointments and patient/caregiver report,  ?-Patient/Caregiver will call pharmacy for medication refills as evidenced by patient report and review of pharmacy fill history as appropriate,  ?-Patient/Caregiver will call provider office for new concerns or questions as evidenced by review of documented incoming telephone call notes and patient report,  ?Patient/Caregiver verbalizes understanding of plan,  ?-Patient/Caregiver will focus on medication adherence by taking medications as prescribed ?-Calls provider office for new concerns, questions, or BP outside discussed parameters ?-Checks BP and records as discussed ?-Follows a low sodium diet/DASH diet ?-check blood sugar at prescribed times ?-check blood sugar if I feel it is too high or too low ?-take the blood sugar meter to all doctor visits  ?  ? ? ?Patient verbalizes understanding of instructions and care plan provided today and agrees to view in [place]. Active MyChart status confirmed with patient.   ? ?Follow up Plan: Patient would like continued follow-up.  CCM RNCM will outreach the patient within the next 5 weeks.  Patient will call office if needed prior to next encounter ? ?Lazaro Arms, RN ? ?[phone]  ?

## 2022-03-07 NOTE — Telephone Encounter (Signed)
-----   Message from Lazaro Arms, RN sent at 03/07/2022 10:17 AM EDT ----- ?Regarding: Metformin ?Good morning Dr. Gwendlyn Deutscher, ? ?I just wanted to let you know that I spoke with Mrs. Haden yesterday and she said that she is taking one metformin at night because her FBS have been ranging between 88-110 and she is afraid that if she takes two that it will be to low. I advised her to discuss it with you. ? ?Thanks  ?Traci ? ?

## 2022-03-07 NOTE — Telephone Encounter (Signed)
I called and discussed DM management with the patient. Metformin should not make her glucose drop. Hence, I advised her to return to her normal Metformin regimen and cut back on her Lantus from 15 units to 10 units daily. ?Follow up appointment made for next Tuesday. ?

## 2022-03-12 ENCOUNTER — Other Ambulatory Visit: Payer: Self-pay

## 2022-03-12 ENCOUNTER — Ambulatory Visit: Payer: Medicare PPO | Admitting: Family Medicine

## 2022-03-12 ENCOUNTER — Encounter: Payer: Self-pay | Admitting: Family Medicine

## 2022-03-12 VITALS — BP 155/53 | HR 74 | Ht 62.0 in | Wt 137.2 lb

## 2022-03-12 DIAGNOSIS — E1159 Type 2 diabetes mellitus with other circulatory complications: Secondary | ICD-10-CM

## 2022-03-12 DIAGNOSIS — I10 Essential (primary) hypertension: Secondary | ICD-10-CM | POA: Diagnosis not present

## 2022-03-12 DIAGNOSIS — N1832 Chronic kidney disease, stage 3b: Secondary | ICD-10-CM | POA: Diagnosis not present

## 2022-03-12 DIAGNOSIS — D696 Thrombocytopenia, unspecified: Secondary | ICD-10-CM

## 2022-03-12 DIAGNOSIS — I700 Atherosclerosis of aorta: Secondary | ICD-10-CM | POA: Diagnosis not present

## 2022-03-12 LAB — POCT GLYCOSYLATED HEMOGLOBIN (HGB A1C): HbA1c, POC (controlled diabetic range): 7 % (ref 0.0–7.0)

## 2022-03-12 MED ORDER — INSULIN GLARGINE 100 UNIT/ML ~~LOC~~ SOLN: 5.0000 [IU] | Freq: Every day | SUBCUTANEOUS | 4 refills | Status: DC

## 2022-03-12 MED ORDER — EMPAGLIFLOZIN 25 MG PO TABS: 25.0000 mg | ORAL_TABLET | Freq: Every day | ORAL | 1 refills | Status: DC

## 2022-03-12 NOTE — Assessment & Plan Note (Signed)
Stable on ASA and Statins.  ?

## 2022-03-12 NOTE — Assessment & Plan Note (Signed)
A1C of 7 today. ?She is still at goal, although this is an increase from her most recent result. ?Given hx of hypoglycemia, plan is to reduce Lantus to 5 units daily and increase Jardiance to 25 mg QD. ?Continue home CBG monitoring. ?F/U in 3 months for reassessment.  ?

## 2022-03-12 NOTE — Assessment & Plan Note (Signed)
BP elevated. ?She did not take her meds. ?F/U in 4 weeks for reassessment. ?

## 2022-03-12 NOTE — Patient Instructions (Signed)
Blood Pressure Record Sheet ?To take your blood pressure, you will need a blood pressure machine. You can buy a blood pressure machine (blood pressure monitor) at your clinic, drug store, or online. When choosing one, consider: ?An automatic monitor that has an arm cuff. ?A cuff that wraps snugly around your upper arm. You should be able to fit only one finger between your arm and the cuff. ?A device that stores blood pressure reading results. ?Do not choose a monitor that measures your blood pressure from your wrist or finger. ?Follow your health care provider's instructions for how to take your blood pressure. To use this form: ?Get one reading in the morning (a.m.) before you take any medicines. ?Get one reading in the evening (p.m.) before supper. ?Take at least 2 readings with each blood pressure check. This makes sure the results are correct. Wait 1-2 minutes between measurements. ?Write down the results in the spaces on this form. ?Repeat this once a week, or as told by your health care provider. ?Make a follow-up appointment with your health care provider to discuss the results. ?Blood pressure log ?Date: _______________________ ?a.m. _____________________(1st reading) _____________________(2nd reading) ?p.m. _____________________(1st reading) _____________________(2nd reading) ?Date: _______________________ ?a.m. _____________________(1st reading) _____________________(2nd reading) ?p.m. _____________________(1st reading) _____________________(2nd reading) ?Date: _______________________ ?a.m. _____________________(1st reading) _____________________(2nd reading) ?p.m. _____________________(1st reading) _____________________(2nd reading) ?Date: _______________________ ?a.m. _____________________(1st reading) _____________________(2nd reading) ?p.m. _____________________(1st reading) _____________________(2nd reading) ?Date: _______________________ ?a.m. _____________________(1st reading)  _____________________(2nd reading) ?p.m. _____________________(1st reading) _____________________(2nd reading) ?This information is not intended to replace advice given to you by your health care provider. Make sure you discuss any questions you have with your health care provider. ?Document Revised: 03/28/2020 Document Reviewed: 03/29/2020 ?Elsevier Patient Education ? [place]. ? ?

## 2022-03-12 NOTE — Assessment & Plan Note (Signed)
COntinue tight glucose control. ?Monitor kidney functions closely. ?

## 2022-03-12 NOTE — Progress Notes (Signed)
? ? ?  SUBJECTIVE:  ? ?CHIEF COMPLAINT / HPI:  ? ?DM/HTN/CKD: ?She is currently on Lantus 10 units daily which was a reduction from 15 units due to hypoglycemia. CBG improved since the change. She is also compliant with Jardiance 10 mg QD and Metformin XR 1000 mg BID. ?This morning, she did not take any of her BP medications. Otherwise, her home BP has been fine. ? ?Anemia/Thrombocytopenia: ?She denies any concerns. She is compliant with her Iron pills and follows with her oncologist closely. ? ?Atherosclerosis: ?Compliant with ASA and Lipitor 20 mg QD. No new concerns. ? ?PERTINENT  PMH / PSH: PMHx reviewed ? ?CBG on 10 units Lantus ? ? ? ? ?OBJECTIVE:  ? ?BP (!) 155/53   Pulse 74   Ht '5\' 2"'$  (1.575 m)   Wt 137 lb 3.2 oz (62.2 kg)   LMP 11/24/1992   SpO2 92%   BMI 25.09 kg/m?   ?Physical Exam ?Vitals and nursing note reviewed.  ?Cardiovascular:  ?   Rate and Rhythm: Normal rate and regular rhythm.  ?   Heart sounds: Normal heart sounds. No murmur heard. ?Pulmonary:  ?   Effort: Pulmonary effort is normal. No respiratory distress.  ?   Breath sounds: Normal breath sounds. No wheezing.  ?Abdominal:  ?   General: Abdomen is flat. Bowel sounds are normal.  ?   Palpations: Abdomen is soft. There is no mass.  ?Musculoskeletal:  ?   Right lower leg: No edema.  ?   Left lower leg: No edema.  ? ? ? ?ASSESSMENT/PLAN:  ? ?Diabetes mellitus type 2, controlled ([place]) ?A1C of 7 today. ?She is still at goal, although this is an increase from her most recent result. ?Given hx of hypoglycemia, plan is to reduce Lantus to 5 units daily and increase Jardiance to 25 mg QD. ?Continue home CBG monitoring. ?F/U in 3 months for reassessment.  ? ?Essential hypertension ?BP elevated. ?She did not take her meds. ?F/U in 4 weeks for reassessment. ? ?Stage 3b chronic kidney disease (CKD) ([place]) ?COntinue tight glucose control. ?Monitor kidney functions closely. ? ?Thrombocytopenia ([place]) ?Platelet is back to normal. ?Continue Iron supplement  for anemia. ?If repeat platelet remains normal, will take this off her problem list. ?Monitor for now.  ? ?Aortic arch atherosclerosis ([place]) ?Stable on ASA and Statins.  ?  ? ?Shingrix discussed. She is undecided above vaccination. ? ?Andrena Mews, MD ?[place]  ? ?

## 2022-03-12 NOTE — Assessment & Plan Note (Signed)
Platelet is back to normal. ?Continue Iron supplement for anemia. ?If repeat platelet remains normal, will take this off her problem list. ?Monitor for now.  ?

## 2022-03-19 ENCOUNTER — Ambulatory Visit
Admission: RE | Admit: 2022-03-19 | Discharge: 2022-03-19 | Disposition: A | Discharge status: Home / Self Care | Payer: Medicare PPO | Source: Ambulatory Visit | Attending: Family Medicine | Admitting: Family Medicine

## 2022-03-19 DIAGNOSIS — Z1231 Encounter for screening mammogram for malignant neoplasm of breast: Secondary | ICD-10-CM | POA: Diagnosis not present

## 2022-04-01 ENCOUNTER — Other Ambulatory Visit: Payer: Self-pay

## 2022-04-01 DIAGNOSIS — E1169 Type 2 diabetes mellitus with other specified complication: Secondary | ICD-10-CM

## 2022-04-01 MED ORDER — ACCU-CHEK GUIDE W/DEVICE KIT: PACK | 0 refills | Status: AC

## 2022-04-10 ENCOUNTER — Ambulatory Visit: Payer: Medicare PPO

## 2022-04-10 NOTE — Patient Instructions (Signed)
Visit Information ? ?Ms. Diffee  it was nice speaking with you. Please call me directly [phone] if you have questions about the goals we discussed. ? ?Patient Goals/Self Care Activities: ?-Patient/Caregiver will self-administer medications as prescribed as evidenced by self-report/primary caregiver report ,  ?-Patient/Caregiver will attend all scheduled provider appointments as evidenced by clinician review of documented attendance to scheduled appointments and patient/caregiver report,  ?-Patient/Caregiver will call pharmacy for medication refills as evidenced by patient report and review of pharmacy fill history as appropriate,  ?-Patient/Caregiver will call provider office for new concerns or questions as evidenced by review of documented incoming telephone call notes and patient report,  ?Patient/Caregiver verbalizes understanding of plan,  ?-Patient/Caregiver will focus on medication adherence by taking medications as prescribed ?-Calls provider office for new concerns, questions, or BP outside discussed parameters ?-Checks BP and records as discussed ?-Follows a low sodium diet/DASH diet ?-check blood sugar at prescribed times ?-check blood sugar if I feel it is too high or too low ?-take the blood sugar meter to all doctor visits  ?  ?  ? ? ?Patient verbalizes understanding of instructions and care plan provided today and agrees to view in [place]. Active MyChart status confirmed with patient.   ? ?Follow up Plan: Patient would like continued follow-up.  CCM RNCM will outreach the patient within the next 8 weeks.  Patient will call office if needed prior to next encounter ? ?Lazaro Arms, RN ? ?[phone]  ?

## 2022-04-10 NOTE — Chronic Care Management (AMB) (Signed)
?Chronic Care Management  ? ?CCM RN Visit Note ? ?04/10/2022 ?Name: SHARNICE BOSLER MRN: [id number] DOB: 20-Jul-1941 ? ?Subjective: ?[name] is a 81 y.o. year old female who is a primary care patient of Eniola, Phill Myron, MD. The care management team was consulted for assistance with disease management and care coordination needs.   ? ?Engaged with patient by telephone for follow up visit in response to provider referral for case management and/or care coordination services.  ? ?Consent to Services:  ?The patient was given information about Chronic Care Management services, agreed to services, and gave verbal consent prior to initiation of services.  Please see initial visit note for detailed documentation.  ? ?Patient agreed to services and verbal consent obtained.  ? ? ?Summary:  The patient continues to maintain positive progress with care plan goals. See Care Plan below for interventions and patient self-care actives. ? ?Recommendation: The patient may benefit from taking medications as prescribed, exercising as tolerated, monitoring food intake, checking Blood sugar and record values, checking blood pressures and record values, and The patient agrees. ? ?Follow up Plan: Patient would like continued follow-up.  CCM RNCM will outreach the patient within the next 8 weeks.  Patient will call office if needed prior to next encounter ? ? ?Assessment: Review of patient past medical history, allergies, medications, health status, including review of consultants reports, laboratory and other test data, was performed as part of comprehensive evaluation and provision of chronic care management services.  ? ?SDOH (Social Determinants of Health) assessments and interventions performed:  No ? ?CCM Care Plan ? ? ? ?Conditions to be addressed/monitored:HTN and DMII ? ?Care Plan : RN Case Manager  ?Updates made by Lazaro Arms, RN since 04/10/2022 12:00 AM  ?  ? ?Problem: Hypertension and Diabetes   ?  ? ?Long-Range  Goal: Monitor HTN and DM II   ?Start Date: 11/17/2019  ?Expected End Date: 06/21/2022  ?Recent Progress: On track  ?Priority: Medium  ?Note:   ? ?Current Barriers:  ?Chronic Disease Management support, education, and care coordination needs related to HTN and DMII ? ?Clinical Goal(s) related to HTN and DMII:  ?Over the next 90 days, patient will:  ?Work with the care management team to address educational, disease management, and care coordination needs  ?Begin or continue self health monitoring activities as directed today Measure and record cbg (blood glucose) as prescribed times daily, Measure and record blood pressure  ? ?Interventions: ?1:1 collaboration with primary care provider regarding development and update of comprehensive plan of care as evidenced by provider attestation and co-signature ?Inter-disciplinary care team collaboration (see longitudinal plan of care) ?Evaluation of current treatment plan related to  self management and patient's adherence to plan as established by provider ? ? ?Hypertension: (Status: Goal on Track (progressing): YES.) Long Term Goal  ?Last practice recorded BP readings:  ?BP Readings from Last 3 Encounters:  ?03/12/22 (!) 155/53  ?01/03/22 105/65  ?12/26/21 108/60  ?Most recent eGFR/CrCl:  ?Lab Results  ?Component Value Date  ? EGFR 50 (L) 11/20/2021  ?  No components found for: CRCL ? ?reduction of dietary sodium encouraged ?blood pressure trends reviewed-  ?home or ambulatory blood pressure monitoring encouraged ? ? ?Diabetes Interventions:  (Status:  Goal on track:  Yes.) Long Term Goal ?Assessed patient's understanding of A1c goal: <7% ? ?Lab Results  ?Component Value Date  ? HGBA1C 7.0 03/12/2022  ?Review of patient status, including review of consultants reports, relevant laboratory and other test results,  and medications completed. ?blood glucose monitoring encouraged ?blood glucose readings reviewed-  ?04/10/22: Mrs. Cartwright is doing well and denies any problems. She  is checking her blood pressure and blood sugar daily. Her Blood pressure range is 145/65, HR 69 to 119/58, HR 66, and her blood sugar range is 147 to 89. She is eating and sleeping ok, has no energy loss, and has an appointment with Dr. Gwendlyn Deutscher on 04/16/22. ? ?Patient Goals/Self Care Activities: ?-Patient/Caregiver will self-administer medications as prescribed as evidenced by self-report/primary caregiver report ,  ?-Patient/Caregiver will attend all scheduled provider appointments as evidenced by clinician review of documented attendance to scheduled appointments and patient/caregiver report,  ?-Patient/Caregiver will call pharmacy for medication refills as evidenced by patient report and review of pharmacy fill history as appropriate,  ?-Patient/Caregiver will call provider office for new concerns or questions as evidenced by review of documented incoming telephone call notes and patient report,  ?Patient/Caregiver verbalizes understanding of plan,  ?-Patient/Caregiver will focus on medication adherence by taking medications as prescribed ?-Calls provider office for new concerns, questions, or BP outside discussed parameters ?-Checks BP and records as discussed ?-Follows a low sodium diet/DASH diet ?-check blood sugar at prescribed times ?-check blood sugar if I feel it is too high or too low ?-take the blood sugar meter to all doctor visits  ? ? ? ?  ? ?Lazaro Arms RN, BSN, [place] ?Care Management Coordinator ?[place]  ?Phone: [phone]  ?  ? ? ? ? ? ? ? ?

## 2022-04-13 ENCOUNTER — Other Ambulatory Visit: Payer: Self-pay | Admitting: Family Medicine

## 2022-04-16 ENCOUNTER — Encounter: Payer: Self-pay | Admitting: Family Medicine

## 2022-04-16 ENCOUNTER — Ambulatory Visit: Payer: Medicare PPO | Admitting: Family Medicine

## 2022-04-16 DIAGNOSIS — L84 Corns and callosities: Secondary | ICD-10-CM

## 2022-04-16 DIAGNOSIS — E1121 Type 2 diabetes mellitus with diabetic nephropathy: Secondary | ICD-10-CM | POA: Diagnosis not present

## 2022-04-16 DIAGNOSIS — I10 Essential (primary) hypertension: Secondary | ICD-10-CM

## 2022-04-16 NOTE — Patient Instructions (Addendum)
Corns and Calluses Corns are small areas of thickened skin that form on the top, sides, or tip of a toe. Corns have a cone-shaped core with a point that can press on a nerve below. This causes pain. Calluses are areas of thickened skin that can form anywhere on the body, including the hands, fingers, palms, soles of the feet, and heels. Calluses are usually larger than corns. What are the causes? Corns and calluses are caused by rubbing (friction) or pressure, such as from shoes that are too tight or do not fit properly. What increases the risk? Corns are more likely to develop in people who have misshapen toes (toe deformities), such as hammer toes. Calluses can form with friction to any area of the skin. They are more likely to develop in people who: Work with their hands. Wear shoes that fit poorly, are too tight, or are high-heeled. Have toe deformities. What are the signs or symptoms? Symptoms of a corn or callus include: A hard growth on the skin. Pain or tenderness under the skin. Redness and swelling. Increased discomfort while wearing tight-fitting shoes, if your feet are affected. If a corn or callus becomes infected, symptoms may include: Redness and swelling that gets worse. Pain. Fluid, blood, or pus draining from the corn or callus. How is this diagnosed? Corns and calluses may be diagnosed based on your symptoms, your medical history, and a physical exam. How is this treated? Treatment for corns and calluses may include: Removing the cause of the friction or pressure. This may involve: Changing your shoes. Wearing shoe inserts (orthotics) or other protective layers in your shoes, such as a corn pad. Wearing gloves. Applying medicine to the skin (topical medicine) to help soften skin in the hardened, thickened areas. Removing layers of dead skin with a file to reduce the size of the corn or callus. Removing the corn or callus with a scalpel or laser. Taking antibiotic  medicines, if your corn or callus is infected. Having surgery, if a toe deformity is the cause. Follow these instructions at home:  Take over-the-counter and prescription medicines only as told by your health care provider. If you were prescribed an antibiotic medicine, take it as told by your health care provider. Do not stop taking it even if your condition improves. Wear shoes that fit well. Avoid wearing high-heeled shoes and shoes that are too tight or too loose. Wear any padding, protective layers, gloves, or orthotics as told by your health care provider. Soak your hands or feet. Then use a file or pumice stone to soften your corn or callus. Do this as told by your health care provider. Check your corn or callus every day for signs of infection. Contact a health care provider if: Your symptoms do not improve with treatment. You have redness or swelling that gets worse. Your corn or callus becomes painful. You have fluid, blood, or pus coming from your corn or callus. You have new symptoms. Get help right away if: You develop severe pain with redness. Summary Corns are small areas of thickened skin that form on the top, sides, or tip of a toe. These can be painful. Calluses are areas of thickened skin that can form anywhere on the body, including the hands, fingers, palms, and soles of the feet. Calluses are usually larger than corns. Corns and calluses are caused by rubbing (friction) or pressure, such as from shoes that are too tight or do not fit properly. Treatment may include wearing padding, protective   layers, gloves, or orthotics as told by your health care provider. This information is not intended to replace advice given to you by your health care provider. Make sure you discuss any questions you have with your health care provider. Document Revised: 04/06/2020 Document Reviewed: 04/06/2020 Elsevier Patient Education  2023 Elsevier Inc.  

## 2022-04-16 NOTE — Assessment & Plan Note (Signed)
Right 2nd toe. ?No tenderness or signs of infection. ?I discussed use of corn cushion to limit friction against her shoe. ?Podiatry referral discussed and she is interested in being referred. ?Referral placed.  ?F/U as needed. ?

## 2022-04-16 NOTE — Progress Notes (Signed)
? ? ?  SUBJECTIVE:  ? ?CHIEF COMPLAINT / HPI:  ? ?DM2/HTN:  ?She is here for BP and DM f/u. She is compliant with Coreg 25 mg BID, Jardiance 25 mg QD, Lantus 5 units QD, Imdur 90 mg QD, Zestoretic 20/12.5 mg QD and Metformin 1000 mg BID. She denies hypoglycemic episodes. She came with her BP and CBG log. ? ?Toe pain: ?She c/o of a hard lesion on top of her right 2nd toe which started about 2 months ago and it causes pain when she wear a covered shoe. She denies trauma to the area. ? ?PERTINENT  PMH / PSH: PMhx reviewed ? ? ?OBJECTIVE:  ? ?BP (!) 120/52   Pulse 82   Ht '5\' 2"'$  (1.575 m)   Wt 136 lb 2 oz (61.7 kg)   LMP 11/24/1992   SpO2 97%   BMI 24.90 kg/m?   ?Physical Exam ?Vitals and nursing note reviewed.  ?Cardiovascular:  ?   Rate and Rhythm: Normal rate and regular rhythm.  ?   Heart sounds: Normal heart sounds. No murmur heard. ?Pulmonary:  ?   Effort: Pulmonary effort is normal. No respiratory distress.  ?   Breath sounds: Normal breath sounds. No wheezing.  ?Musculoskeletal:  ?   Right lower leg: No edema.  ?   Left lower leg: No edema.  ?   Comments: Thickened, hyperpigmented round lesion on the dorsum of her 2nd toe  ? ? ? ? ?ASSESSMENT/PLAN:  ? ?Essential hypertension ?Home BP readings reviewed - see image on media ?BP looks good. ?Continue current regimen.  ? ?Diabetes mellitus type 2, controlled ([place]) ?Her home CBG log reviewed. ?No hypoglycemic episode ?Continue current regimen. ?F/U in 2 months for reassessment.  ? ?Corn of toe ?Right 2nd toe. ?No tenderness or signs of infection. ?I discussed use of corn cushion to limit friction against her shoe. ?Podiatry referral discussed and she is interested in being referred. ?Referral placed.  ?F/U as needed. ?  ? ?Andrena Mews, MD ?[place]  ? ?

## 2022-04-16 NOTE — Assessment & Plan Note (Signed)
Her home CBG log reviewed. ?No hypoglycemic episode ?Continue current regimen. ?F/U in 2 months for reassessment.  ?

## 2022-04-16 NOTE — Assessment & Plan Note (Signed)
Home BP readings reviewed - see image on media ?BP looks good. ?Continue current regimen.  ?

## 2022-04-17 ENCOUNTER — Encounter: Payer: Self-pay | Admitting: Internal Medicine

## 2022-04-24 ENCOUNTER — Other Ambulatory Visit: Payer: Self-pay | Admitting: Physician Assistant

## 2022-04-24 ENCOUNTER — Other Ambulatory Visit: Payer: Self-pay | Admitting: Family Medicine

## 2022-05-08 LAB — HM DIABETES EYE EXAM

## 2022-05-13 ENCOUNTER — Encounter: Payer: Self-pay | Admitting: Podiatrist

## 2022-05-13 ENCOUNTER — Ambulatory Visit: Payer: Medicare PPO | Admitting: Podiatrist

## 2022-05-13 DIAGNOSIS — L84 Corns and callosities: Secondary | ICD-10-CM

## 2022-05-13 NOTE — Progress Notes (Signed)
Chief Complaint  Patient presents with   Callouses    Corn second toe      HPI: Patient is 81 y.o. female who presents today for a possible corn on the top of the right second toe. She relates pain with pressure from shoes.  She relates the pain has been present for 2 months and she has tried changing shoes with minimal relief.   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 08/29/2017   Nodule of right lung 08/29/2017   Aortic arch atherosclerosis ([place]) 08/29/2017   Liver cyst    Left thyroid nodule 07/27/2014   History of thyroid cancer 04/12/2013   Diabetes mellitus type 2, controlled ([place]) 02/19/2007   HYPERCHOLESTEROLEMIA 02/19/2007   Anemia of chronic disease 02/19/2007   Essential hypertension 02/19/2007   Coronary artery disease involving native coronary artery of native heart without angina pectoris 02/19/2007    Current Outpatient Medications on File Prior to Visit  Medication Sig Dispense Refill   fluticasone (FLONASE) 50 MCG/ACT nasal spray Place into the nose.     Accu-Chek FastClix Lancets MISC USE ONE LANCET TO TEST THREE TIMES A DAY 102 each 4   aspirin 81 MG chewable tablet Chew 81 mg by mouth daily.     atorvastatin (LIPITOR) 20 MG tablet TAKE ONE TABLET BY MOUTH DAILY AT 6PM 90 tablet 2   Blood Glucose Monitoring Suppl (ACCU-CHEK GUIDE) w/Device KIT USE AS DIRECTED IN THE MORNING, AT NOON AND AT BEDTIME Strength: w/Device 1 kit 0   carvedilol (COREG) 25 MG tablet TAKE ONE TABLET BY MOUTH TWICE A DAY WITH  MEALS 180 tablet 1   empagliflozin (JARDIANCE) 25 MG TABS tablet Take 1 tablet (25 mg total) by mouth daily. 90 tablet 1   ferrous sulfate 325 (65 FE) MG tablet Take 325 mg by mouth daily with breakfast.     glucose blood (ACCU-CHEK GUIDE) test strip USE ONE STRIP TO TEST THREE TIMES A DAY 100 strip 5   insulin glargine (LANTUS) 100 UNIT/ML injection Inject 0.05 mLs (5 Units total) into the skin daily. 10 mL 4   Insulin Syringe-Needle U-100 (RELION INSULIN SYRINGE) 31G X 15/64" 1 ML MISC USE  AS DIRECTED TID FOR CBG CHECK 100 each 5   isosorbide mononitrate (IMDUR) 60 MG 24 hr tablet TAKE 1 AND 1/2 TABLETS BY MOUTH DAILY 135 tablet 0   lisinopril-hydrochlorothiazide (ZESTORETIC) 20-12.5 MG tablet Take 1 tablet by mouth daily. 90 tablet 1   metFORMIN (GLUCOPHAGE-XR) 500 MG 24 hr tablet TAKE TWO TABLETS BY MOUTH TWICE A DAY 360 tablet 1   Multiple Vitamin (MULTIVITAMIN WITH MINERALS) TABS tablet Take 1 tablet by mouth daily. Reported on 04/02/2016     No current facility-administered medications on file prior to visit.    Allergies  Allergen Reactions   Codeine Nausea And Vomiting   Sulfamethoxazole-Trimethoprim Nausea And Vomiting   Tramadol Nausea And Vomiting    Review of Systems No fevers, chills, nausea, muscle aches, no difficulty breathing, no calf pain, no chest pain or  shortness of breath.   Physical Exam  GENERAL APPEARANCE: Alert, conversant. Appropriately groomed. No acute distress.   VASCULAR: Pedal pulses palpable DP and PT bilateral.  Capillary refill time is immediate to all digits,  Proximal to distal cooling it warm to warm.  Digital perfusion adequate.   NEUROLOGIC: sensation is intact to 5.07 monofilament at 5/5 sites bilateral.  Light touch is intact bilateral, vibratory sensation intact bilateral  MUSCULOSKELETAL: acceptable muscle strength, tone and stability bilateral. Bunion deformity with hallux abductus noted right with a contracture of the second toe present  at the proximal interphalangeal joint.   DERMATOLOGIC: skin is warm, supple, and dry.  Dorsal contracture of the second toe with overlying hyperkeratotic lesion/ corn is noted.     Assessment     ICD-10-CM   1. Corn of toe  L84        Plan  Discussed exam findings and treatment options.  I recommended paring the lesion today and trying different pads to relieve the pressure.  This was carried out today with a 15 blade and an assortment of corn pads were dispensed for her to try.  Discussed if the corn is severely bothersome, a surgical correction would be an option, however, she would also need the bunion addressed as it is a causative factor in the toe contracture.  She will try conservative treatments and call if further treatment requested.

## 2022-05-13 NOTE — Patient Instructions (Signed)
Corns and Calluses Corns are small areas of thickened skin that form on the top, sides, or tip of a toe. Corns have a cone-shaped core with a point that can press on a nerve below. This causes pain. Calluses are areas of thickened skin that can form anywhere on the body, including the hands, fingers, palms, soles of the feet, and heels. Calluses are usually larger than corns. What are the causes? Corns and calluses are caused by rubbing (friction) or pressure, such as from shoes that are too tight or do not fit properly. What increases the risk? Corns are more likely to develop in people who have misshapen toes (toe deformities), such as hammer toes. Calluses can form with friction to any area of the skin. They are more likely to develop in people who: Work with their hands. Wear shoes that fit poorly, are too tight, or are high-heeled. Have toe deformities. What are the signs or symptoms? Symptoms of a corn or callus include: A hard growth on the skin. Pain or tenderness under the skin. Redness and swelling. Increased discomfort while wearing tight-fitting shoes, if your feet are affected. If a corn or callus becomes infected, symptoms may include: Redness and swelling that gets worse. Pain. Fluid, blood, or pus draining from the corn or callus. How is this diagnosed? Corns and calluses may be diagnosed based on your symptoms, your medical history, and a physical exam. How is this treated? Treatment for corns and calluses may include: Removing the cause of the friction or pressure. This may involve: Changing your shoes. Wearing shoe inserts (orthotics) or other protective layers in your shoes, such as a corn pad. Wearing gloves. Applying medicine to the skin (topical medicine) to help soften skin in the hardened, thickened areas. Removing layers of dead skin with a file to reduce the size of the corn or callus. Removing the corn or callus with a scalpel or laser. Taking antibiotic  medicines, if your corn or callus is infected. Having surgery, if a toe deformity is the cause. Follow these instructions at home:  Take over-the-counter and prescription medicines only as told by your health care provider. If you were prescribed an antibiotic medicine, take it as told by your health care provider. Do not stop taking it even if your condition improves. Wear shoes that fit well. Avoid wearing high-heeled shoes and shoes that are too tight or too loose. Wear any padding, protective layers, gloves, or orthotics as told by your health care provider. Soak your hands or feet. Then use a file or pumice stone to soften your corn or callus. Do this as told by your health care provider. Check your corn or callus every day for signs of infection. Contact a health care provider if: Your symptoms do not improve with treatment. You have redness or swelling that gets worse. Your corn or callus becomes painful. You have fluid, blood, or pus coming from your corn or callus. You have new symptoms. Get help right away if: You develop severe pain with redness. Summary Corns are small areas of thickened skin that form on the top, sides, or tip of a toe. These can be painful. Calluses are areas of thickened skin that can form anywhere on the body, including the hands, fingers, palms, and soles of the feet. Calluses are usually larger than corns. Corns and calluses are caused by rubbing (friction) or pressure, such as from shoes that are too tight or do not fit properly. Treatment may include wearing padding, protective   layers, gloves, or orthotics as told by your health care provider. This information is not intended to replace advice given to you by your health care provider. Make sure you discuss any questions you have with your health care provider. Document Revised: 04/06/2020 Document Reviewed: 04/06/2020 Elsevier Patient Education  2023 Elsevier Inc.  

## 2022-05-28 ENCOUNTER — Encounter: Payer: Self-pay | Admitting: *Deleted

## 2022-05-29 ENCOUNTER — Ambulatory Visit: Payer: Medicare PPO

## 2022-05-29 NOTE — Patient Instructions (Signed)
Visit Information  [name]  it was nice speaking with you. Please call me directly [phone] if you have questions about the goals we discussed.    Patient Goals/Self Care Activities: -Patient/Caregiver will self-administer medications as prescribed as evidenced by self-report/primary caregiver report ,  -Patient/Caregiver will attend all scheduled provider appointments as evidenced by clinician review of documented attendance to scheduled appointments and patient/caregiver report,  -Patient/Caregiver will call pharmacy for medication refills as evidenced by patient report and review of pharmacy fill history as appropriate,  -Patient/Caregiver will call provider office for new concerns or questions as evidenced by review of documented incoming telephone call notes and patient report,  Patient/Caregiver verbalizes understanding of plan,  -Patient/Caregiver will focus on medication adherence by taking medications as prescribed -Calls provider office for new concerns, questions, or BP outside discussed parameters -Checks BP and records as discussed -Follows a low sodium diet/DASH diet -check blood sugar at prescribed times -check blood sugar if I feel it is too high or too low -take the blood sugar meter to all doctor visits        Patient verbalizes understanding of instructions and care plan provided today and agrees to view in [place]. Active MyChart status and patient understanding of how to access instructions and care plan via MyChart confirmed with patient.     Follow up Plan: Patient would like continued follow-up.  CCM RNCM will outreach the patient within the next 4 weeks.  Patient will call office if needed prior to next encounter  Lazaro Arms, RN  [phone]

## 2022-05-29 NOTE — Chronic Care Management (AMB) (Signed)
Chronic Care Management   CCM RN Visit Note  05/29/2022 Name: [name] MRN: [id number] DOB: [birth year]  Subjective: [name] is a 81 y.o. year old female who is a primary care patient of Michele Feil, MD. The care management team was consulted for assistance with disease management and care coordination needs.    Engaged with patient by telephone for follow up visit in response to provider referral for case management and/or care coordination services.   Consent to Services:  The patient was given information about Chronic Care Management services, agreed to services, and gave verbal consent prior to initiation of services.  Please see initial visit note for detailed documentation.   Patient agreed to services and verbal consent obtained.    Summary:  The patient continues to maintain positive progress with care plan goals. See Care Plan below for interventions and patient self-care actives.  Recommendation: The patient may benefit from taking medications as prescribed, exercising as tolerated, checking Blood sugar and record values, checking blood pressures and record values, calling your physician if numbers are abnormal, and The patient agrees.  Follow up Plan: Patient would like continued follow-up.  CCM RNCM will outreach the patient within the next 4 weeks.  Patient will call office if needed prior to next encounter   Assessment: Review of patient past medical history, allergies, medications, health status, including review of consultants reports, laboratory and other test data, was performed as part of comprehensive evaluation and provision of chronic care management services.   SDOH (Social Determinants of Health) assessments and interventions performed:  No  CCM Care Plan  Conditions to be addressed/monitored:HTN and DMII  Care Plan : RN Case Manager  Updates made by Michele Arms, RN since 05/29/2022 12:00 AM     Problem: Hypertension and Diabetes       Long-Range Goal: Monitor HTN and DM II   Start Date: 11/17/2019  Expected End Date: 06/21/2022  Recent Progress: On track  Priority: Medium  Note:    Current Barriers:  Chronic Disease Management support, education, and care coordination needs related to HTN and DMII  Clinical Goal(s) related to HTN and DMII:  Over the next 90 days, patient will:  Work with the care management team to address educational, disease management, and care coordination needs  Begin or continue self health monitoring activities as directed today Measure and record cbg (blood glucose) as prescribed times daily, Measure and record blood pressure   Interventions: 1:1 collaboration with primary care provider regarding development and update of comprehensive plan of care as evidenced by provider attestation and co-signature Inter-disciplinary care team collaboration (see longitudinal plan of care) Evaluation of current treatment plan related to  self management and patient's adherence to plan as established by provider   Hypertension: (Status: Goal on Track (progressing): YES.) Long Term Goal  Last practice recorded BP readings:  BP Readings from Last 3 Encounters:  04/16/22 (!) 120/52  03/12/22 (!) 155/53  01/03/22 105/65  Most recent eGFR/CrCl:  Lab Results  Component Value Date   EGFR 50 (L) 11/20/2021    No components found for: CRCL  reduction of dietary sodium encouraged blood pressure trends reviewed-  home or ambulatory blood pressure monitoring encouraged   Diabetes Interventions:  (Status:  Goal on track:  Yes.) Long Term Goal Assessed patient's understanding of A1c goal: <7%  Lab Results  Component Value Date   HGBA1C 7.0 03/12/2022  Review of patient status, including review of consultants reports, relevant laboratory and  other test results, and medications completed. blood glucose monitoring encouraged blood glucose readings reviewed-  05/29/22: [name] is in good health and  monitoring her vitals twice a day. Her blood pressure in the morning ranged 116/59 HR 69 to 124/61 HR 73  and from 121/58 HR 74 to 157/52 HR 76 in the evening . Her fasting blood sugar levels range from 99 to 112, in the morniong and they are between 130 and 140 in the afternoon. She has maintained her regular eating and sleeping habits and has not experienced any pain or falls. A follow-up appointment with Dr. Gwendlyn Deutscher is scheduled for 06/18/22.  Patient Goals/Self Care Activities: -Patient/Caregiver will self-administer medications as prescribed as evidenced by self-report/primary caregiver report ,  -Patient/Caregiver will attend all scheduled provider appointments as evidenced by clinician review of documented attendance to scheduled appointments and patient/caregiver report,  -Patient/Caregiver will call pharmacy for medication refills as evidenced by patient report and review of pharmacy fill history as appropriate,  -Patient/Caregiver will call provider office for new concerns or questions as evidenced by review of documented incoming telephone call notes and patient report,  Patient/Caregiver verbalizes understanding of plan,  -Patient/Caregiver will focus on medication adherence by taking medications as prescribed -Calls provider office for new concerns, questions, or BP outside discussed parameters -Checks BP and records as discussed -Follows a low sodium diet/DASH diet -check blood sugar at prescribed times -check blood sugar if I feel it is too high or too low -take the blood sugar meter to all doctor visits       Michele Arms RN, BSN, [place]  Phone: [phone]

## 2022-06-18 ENCOUNTER — Encounter: Payer: Self-pay | Admitting: Family Medicine

## 2022-06-18 ENCOUNTER — Ambulatory Visit: Payer: Medicare PPO | Admitting: Family Medicine

## 2022-06-18 VITALS — BP 134/54 | HR 74 | Ht 62.0 in | Wt 134.0 lb

## 2022-06-18 DIAGNOSIS — I10 Essential (primary) hypertension: Secondary | ICD-10-CM

## 2022-06-18 DIAGNOSIS — E1121 Type 2 diabetes mellitus with diabetic nephropathy: Secondary | ICD-10-CM

## 2022-06-18 DIAGNOSIS — N814 Uterovaginal prolapse, unspecified: Secondary | ICD-10-CM | POA: Diagnosis not present

## 2022-06-18 LAB — POCT GLYCOSYLATED HEMOGLOBIN (HGB A1C): HbA1c, POC (controlled diabetic range): 6.7 % (ref 0.0–7.0)

## 2022-06-18 MED ORDER — METFORMIN HCL ER 500 MG PO TB24: 500.0000 mg | ORAL_TABLET | Freq: Two times a day (BID) | ORAL | 1 refills | Status: DC

## 2022-06-18 NOTE — Progress Notes (Addendum)
    SUBJECTIVE:   CHIEF COMPLAINT / HPI:   DM2/HTN: She is here to f/u for her DM and HTN. She is compliant with her current regimen as listed on file. No concerns.  Vagina bulging:  C/O bulging tissue from her vagina, which she is able to push in. This has been ongoing x 8 months. She denies pain, no vaginal bleeding, or discharge. No GI/GU symptoms.   PERTINENT  PMH / PSH: PMHx reviewed.  OBJECTIVE:   Vitals:   06/18/22 1056 06/18/22 1109  BP: (!) 141/49 (!) 134/54  Pulse: 74   SpO2: 95%   Weight: 134 lb (60.8 kg)   Height: '5\' 2"'$  (1.575 m)     Physical Exam Vitals and nursing note reviewed. Exam conducted with a chaperone present Cherrie Distance Leget[place]).  Constitutional:      Appearance: Normal appearance.  Cardiovascular:     Rate and Rhythm: Normal rate and regular rhythm.     Heart sounds: Normal heart sounds. No murmur heard. Pulmonary:     Effort: Pulmonary effort is normal. No respiratory distress.     Breath sounds: Normal breath sounds.  Abdominal:     General: Abdomen is flat. Bowel sounds are normal. There is no distension.     Palpations: There is no mass.  Genitourinary:    Exam position: Lithotomy position.     Vagina: Prolapsed vaginal walls present.     Comments: Vagina vault prolapsed and is visible on pelvic exam without speculum insertion Neurological:     Mental Status: She is alert.      ASSESSMENT/PLAN:   Essential hypertension SBP initially elevated with low DBP. Repeat BP improved. Continue current regimen.   Diabetes mellitus type 2, controlled ([place]) A1C 6.7 Her A1C goal is < 7.5 I advised her to cut back on her metformin to 1 tablets bid. Continue other regimen as it is. She agreed with the plan.   Uterine prolapse Patient counseled regarding this diagnosis. Treatment options discussed including surgical management vs Pessary insertion. Gyn referral recommended and she agreed with the plan.     Andrena Mews, MD [place]

## 2022-06-18 NOTE — Assessment & Plan Note (Signed)
SBP initially elevated with low DBP. Repeat BP improved. Continue current regimen.

## 2022-06-19 LAB — MICROALBUMIN / CREATININE URINE RATIO
Creatinine, Urine: 93.2 mg/dL
Microalb/Creat Ratio: 7 mg/g creat (ref 0–29)
Microalbumin, Urine: 6.1 ug/mL

## 2022-06-20 NOTE — Progress Notes (Signed)
Test result discussed with the patient.

## 2022-06-28 ENCOUNTER — Telehealth: Payer: Medicare PPO

## 2022-06-28 ENCOUNTER — Telehealth: Payer: Self-pay

## 2022-06-28 NOTE — Telephone Encounter (Signed)
   RN Case Manager Care Management   Phone Outreach    06/28/2022 Name: [name] MRN: [id number] DOB: March 02, 1941  [name] is a 81 y.o. year old female who is a primary care patient of Eniola, Phill Myron, MD .   Telephone outreach was unsuccessful A HIPPA compliant phone message was left for the patient providing contact information and requesting a return call.   Follow Up Plan: Will route chart to Care Guide to see if patient would like to reschedule phone appointment.    Review of patient status, including review of consultants reports, relevant laboratory and other test results, and collaboration with appropriate care team members and the patient's provider was performed as part of comprehensive patient evaluation and provision of care management services.    Lazaro Arms RN, BSN, [facility] Care Management Coordinator [place] Phone: [phone] Fax: [phone]

## 2022-07-02 ENCOUNTER — Encounter: Payer: Self-pay | Admitting: Obstetrics and Gynecology

## 2022-07-02 ENCOUNTER — Ambulatory Visit: Payer: Medicare PPO | Admitting: Obstetrics and Gynecology

## 2022-07-02 VITALS — BP 150/72 | HR 67 | Ht 62.5 in | Wt 134.0 lb

## 2022-07-02 DIAGNOSIS — N814 Uterovaginal prolapse, unspecified: Secondary | ICD-10-CM | POA: Diagnosis not present

## 2022-07-02 DIAGNOSIS — N8111 Cystocele, midline: Secondary | ICD-10-CM | POA: Diagnosis not present

## 2022-07-02 NOTE — Progress Notes (Signed)
81 y.o. No obstetric history on file. Widowed Black or African American Not Hispanic or Latino female here for prolapse.  She c/o a prolapse for the last 9-10 months. A couple of times a week she needs to push it up. Occasionally comes down during the day. No pain, no bleeding. No trouble with voiding. No incontinence. Normal BM daily, occasional episodes of diarrhea.   [id number], all FTNSVD's. Largest ~ 7 lbs 5 oz. One son died in a MVA 7 years ago. Over 25 grandchildren, over 25 great grandchildren. Her 25 year old grandson was killed over the weekend.    Not sexually active.   Patient's last menstrual period was 11/24/1992.          Sexually active: Yes.    The current method of family planning is post menopausal status.    Exercising: No.   She does a little walking  Smoker:  no  Health Maintenance: Pap:  2009 normal  History of abnormal Pap:  no MMG:  03/19/22 density B Bi-rads 1 neg  BMD:   unsure  Colonoscopy: 07/02/13 diverticula  TDaP:  11/07/16  Gardasil: n/a   reports that she quit smoking about 41 years ago. Her smoking use included cigarettes. She quit smokeless tobacco use about 27 years ago.  Her smokeless tobacco use included chew. She reports that she does not drink alcohol and does not use drugs.  Past Medical History:  Diagnosis Date   Aortic arch atherosclerosis (HCC) 08/29/2017   Carpal tunnel syndrome 02/19/2007   Qualifier: Diagnosis of  By: Odell, Erin     Coronary artery disease    a. Remote PTCA of the PDA;  b. 02/2009 PCI/CBA to the D1;  c. 11/2011 Cath: LM nl, LAD 20p, 30m, D1 patent, RI 20p, LCX nl, RCA 30d, EF 55-65%.// Myoview 04/2020: EF 69, no ischemia or infarction, low risk    CTS (carpal tunnel syndrome)    Dysphagia 11/13/2018   GERD (gastroesophageal reflux disease)    History of papillary adenocarcinoma of thyroid 1992   right thyroid lobectomy   History of thyroid cancer    Hypothyroid    Hypothyroidism 02/19/2007   Qualifier: Diagnosis of  By:  Odell, Erin     IBS (irritable bowel syndrome) 12/27/2014   Nodule of right lung 08/29/2017   3 mm stable nodule right upper lobe on CTA Chest 08/28/17   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 08/29/2017   Subscapular pain, left 08/29/2017  H/O DM, HgbA1C 6.7  Past Surgical History:  Procedure Laterality Date   ANGIOPLASTY     PDA 90-40 %   BARTHOLIN GLAND CYST EXCISION  09-13-2004   Keratosis- no CA   BREAST BIOPSY     LEFT SPOT REMOVED   BREAST EXCISIONAL BIOPSY Left    CARDIAC CATHETERIZATION  03/15/2009   stenosis of diagonal branch of LAD- PTCA performed   CHOLECYSTECTOMY  08-23-2002   CORONARY ANGIOPLASTY     ENDOMETRIAL BIOPSY  02-20-1998   LEFT HEART CATHETERIZATION WITH CORONARY ANGIOGRAM N/A 11/26/2011   Procedure: LEFT HEART CATHETERIZATION WITH CORONARY ANGIOGRAM;  Surgeon: Peter M Jordan, MD;  Location: [facility] CATH LAB;  Service: Cardiovascular;  Laterality: N/A;   PTCA  10/23/2000 and 03/15/09   THYROIDECTOMY Right 10-06-1991   Hashimotos and papillary carcinoma   TONSILLECTOMY     TUBAL LIGATION      Current Outpatient Medications  Medication Sig Dispense Refill   Accu-Chek FastClix Lancets MISC USE ONE LANCET TO TEST THREE   TIMES A DAY 102 each 4   aspirin 81 MG chewable tablet Chew 81 mg by mouth daily.     atorvastatin (LIPITOR) 20 MG tablet TAKE ONE TABLET BY MOUTH DAILY AT 6PM 90 tablet 2   Blood Glucose Monitoring Suppl (ACCU-CHEK GUIDE) w/Device KIT USE AS DIRECTED IN THE MORNING, AT NOON AND AT BEDTIME Strength: w/Device 1 kit 0   carvedilol (COREG) 25 MG tablet TAKE ONE TABLET BY MOUTH TWICE A DAY WITH MEALS 180 tablet 1   empagliflozin (JARDIANCE) 25 MG TABS tablet Take 1 tablet (25 mg total) by mouth daily. 90 tablet 1   ferrous sulfate 325 (65 FE) MG tablet Take 325 mg by mouth daily with breakfast.     fluticasone (FLONASE) 50 MCG/ACT nasal spray Place into the nose.     glucose blood (ACCU-CHEK GUIDE) test strip USE ONE STRIP TO TEST THREE TIMES A DAY  100 strip 5   insulin glargine (LANTUS) 100 UNIT/ML injection Inject 0.05 mLs (5 Units total) into the skin daily. 10 mL 4   Insulin Syringe-Needle U-100 (RELION INSULIN SYRINGE) 31G X 15/64" 1 ML MISC USE  AS DIRECTED TID FOR CBG CHECK 100 each 5   isosorbide mononitrate (IMDUR) 60 MG 24 hr tablet TAKE 1 AND 1/2 TABLETS BY MOUTH DAILY 135 tablet 0   lisinopril-hydrochlorothiazide (ZESTORETIC) 20-12.5 MG tablet Take 1 tablet by mouth daily. 90 tablet 1   metFORMIN (GLUCOPHAGE-XR) 500 MG 24 hr tablet Take 1 tablet (500 mg total) by mouth 2 (two) times daily. 180 tablet 1   Multiple Vitamin (MULTIVITAMIN WITH MINERALS) TABS tablet Take 1 tablet by mouth daily. Reported on 04/02/2016     No current facility-administered medications for this visit.    Family History  Problem Relation Age of Onset   Diabetes Mother        Deceased- hypoglycemic coma    Hypertension Mother    Other Father        unaware of his health history - died when pt was 81 yr old.   Heart attack Sister    Diabetes Mellitus II Brother    Heart attack Brother    Heart attack Brother    Diabetes Mellitus II Brother    Epilepsy Brother    Breast cancer Daughter     Review of Systems  Genitourinary:  Positive for difficulty urinating.  All other systems reviewed and are negative.   Exam:   BP (!) 150/72   Pulse 67   Ht 5' 2.5" (1.588 m)   Wt 134 lb (60.8 kg)   LMP 11/24/1992   SpO2 100%   BMI 24.12 kg/m   Weight change: _0 @ Height:   Height: 5' 2.5" (158.8 cm)  Ht Readings from Last 3 Encounters:  07/02/22 5' 2.5" (1.588 m)  06/18/22 5' 2" (1.575 m)  04/16/22 5' 2" (1.575 m)    General appearance: alert, cooperative and appears stated age   Pelvic: External genitalia:  no lesions              Urethra:  normal appearing urethra with no masses, tenderness or lesions              Bartholins and Skenes: normal                 Vagina: normal appearing vagina with normal color and discharge, no  lesions              Cervix: no lesions  Her cervix comes ~3  cm out of her introitus with valsalva, grade 2-3 cystocele, relaxation of the vaginal apex, no significant rectocele               Bimanual Exam:  Uterus:  normal size, contour, position, consistency, mobility, non-tender              Adnexa: no mass, fullness, tenderness                Gae Dry chaperoned for the exam.  1. Uterine prolapse Discussed prolapse. Discussed options of doing nothing, trial of pessary and surgery. ACOG handout given, images reviewed I showed her the different pessaries, I suspect she would need a cube or a gellhorn I discussed the option of colpocleisis, she has no desire to ever be sexually active.  She will return for a pessary fitting  2. Midline cystocele See above

## 2022-07-02 NOTE — Patient Instructions (Signed)
Pelvic Organ Prolapse Pelvic organ prolapse is a condition in women that involves the stretching, bulging, or dropping of pelvic organs into an abnormal position, past the opening of the vagina. It happens when the muscles and tissues that surround and support pelvic structures become weak or stretched. Pelvic organ prolapse can involve the: Vagina (vaginal prolapse). Uterus (uterine prolapse). Bladder (cystocele). Rectum (rectocele). Intestines (enterocele). When organs other than the vagina are involved, they often bulge into the vagina or protrude from the vagina, depending on how severe the prolapse is. What are the causes? This condition may be caused by: Pregnancy, labor, and childbirth. Past pelvic surgery. Lower levels of the hormone estrogen due to menopause. Consistently lifting more than 50 lb (23 kg). Obesity. Long-term difficulty passing stool (chronic constipation). Long-term, or chronic, cough. Fluid buildup in the abdomen due to certain conditions. What are the signs or symptoms? Symptoms of this condition include: Leaking a little urine (loss of bladder control) when you cough, sneeze, strain, and exercise (stress incontinence). This may be worse immediately after childbirth. It may gradually improve over time. Feeling pressure in your pelvis or vagina. This pressure may increase when you cough or when you are passing stool. A bulge that protrudes from the opening of your vagina. Difficulty passing urine or stool. Pain in your lower back. Pain or discomfort during sex, or decreased interest in sex. Repeated bladder infections (urinary tract infections). Difficulty inserting a tampon. In some people, this condition causes no symptoms. How is this diagnosed? This condition may be diagnosed based on a vaginal and rectal exam. During the exam, you may be asked to cough and strain while you are lying down, sitting, and standing up. Your health care provider will determine if  other tests are required, such as bladder function tests. How is this treated? Treatment for this condition may depend on your symptoms. Treatment may include: Lifestyle changes, such as drinking plenty of fluids and eating foods that are high in fiber. Emptying your bladder at scheduled times (bladder training therapy). This can help reduce or avoid urinary incontinence. Estrogen. This may help mild prolapse by increasing the strength and tone of pelvic floor muscles. Kegel exercises. These may help mild cases of prolapse by strengthening and tightening the muscles of the pelvic floor. A soft, flexible device that helps support the vaginal walls and keep pelvic organs in place (pessary). This is inserted into your vagina by your health care provider. Surgery. This is often the only form of treatment for severe prolapse. Follow these instructions at home: Eating and drinking Avoid drinking beverages that contain caffeine or alcohol. Increase your intake of high-fiber foods to decrease constipation and straining during bowel movements. Activity Lose weight if recommended by your health care provider. Avoid heavy lifting and straining with exercise and work. Do not hold your breath when you perform mild to moderate lifting and exercise activities. Limit your activities as directed by your health care provider. Do Kegel exercises as directed by your health care provider. To do this: Squeeze your pelvic floor muscles tight. You should feel a tight lift in your rectal area and a tightness in your vaginal area. Keep your stomach, buttocks, and legs relaxed. Hold the muscles tight for up to 10 seconds. Then relax your muscles. Repeat this exercise 50 times a day, or as much as told by your health care provider. Continue to do this exercise for at least 4-6 weeks, or for as long as told by your health care provider.   General instructions Take over-the-counter and prescription medicines only as told by  your health care provider. Wear a sanitary pad or adult diapers if you have urinary incontinence. If you have a pessary, take care of it as told by your health care provider. Keep all follow-up visits. This is important. Contact a health care provider if you: Have symptoms that interfere with your daily activities or sex life. Need medicine to help with the discomfort. Notice bleeding from your vagina that is not related to your menstrual period. Have a fever. Have pain or bleeding when you urinate. Have bleeding when you pass stool. Pass urine when you have sex. Have chronic constipation. Have a pessary that falls out. Have a foul-smelling vaginal discharge. Have an unusual, low pain in your abdomen. Get help right away if you: Cannot pass urine. Summary Pelvic organ prolapse is the stretching, bulging, or dropping of pelvic organs into an abnormal position. It happens when the muscles and tissues that surround and support pelvic structures become weak or stretched. When organs other than the vagina are involved, they often bulge into the vagina or protrude from it, depending on how severe the prolapse is. In most cases, this condition needs to be treated only if it produces symptoms. Treatment may include lifestyle changes, estrogen, Kegel exercises, pessary insertion, or surgery. Avoid heavy lifting and straining with exercise and work. Do not hold your breath when you perform mild to moderate lifting and exercise activities. Limit your activities as directed by your health care provider. This information is not intended to replace advice given to you by your health care provider. Make sure you discuss any questions you have with your health care provider. Document Revised: 06/05/2020 Document Reviewed: 06/05/2020 Elsevier Patient Education  2023 Elsevier Inc.  

## 2022-07-04 ENCOUNTER — Encounter: Payer: Self-pay | Admitting: Obstetrics and Gynecology

## 2022-07-04 ENCOUNTER — Other Ambulatory Visit: Payer: Self-pay

## 2022-07-04 ENCOUNTER — Ambulatory Visit: Payer: Medicare PPO | Admitting: Obstetrics and Gynecology

## 2022-07-04 VITALS — BP 160/70 | Ht 62.5 in | Wt 134.0 lb

## 2022-07-04 DIAGNOSIS — N814 Uterovaginal prolapse, unspecified: Secondary | ICD-10-CM | POA: Diagnosis not present

## 2022-07-04 DIAGNOSIS — N8111 Cystocele, midline: Secondary | ICD-10-CM

## 2022-07-04 MED ORDER — ISOSORBIDE MONONITRATE ER 60 MG PO TB24: 90.0000 mg | ORAL_TABLET | Freq: Every day | ORAL | 1 refills | Status: DC

## 2022-07-04 NOTE — Progress Notes (Signed)
GYNECOLOGY  VISIT   HPI: 81 y.o.   Widowed Black or [place] American Not Hispanic or Latino  female   [phone] with Patient's last menstrual period was 11/24/1992.   here for pessary fitting. She has lots of questions. Stating the prolapse doesn't really bother her very much. Every few days she needs to reduce the prolapse, sometimes she will do this when her urine flow is slow and then it will be normal.   GYNECOLOGIC HISTORY: Patient's last menstrual period was 11/24/1992. 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   Iron deficiency anemia 02/25/2019   Thrombocytopenia ([place]) 02/09/2019   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 08/29/2017   Nodule of right lung 08/29/2017   Aortic arch atherosclerosis ([place]) 08/29/2017   Liver cyst    Left thyroid nodule 07/27/2014   History of thyroid cancer 04/12/2013   Diabetes mellitus type 2, controlled ([place]) 02/19/2007   HYPERCHOLESTEROLEMIA 02/19/2007   Anemia of chronic disease 02/19/2007   Essential hypertension 02/19/2007   Coronary artery disease involving native coronary artery of native heart without angina pectoris 02/19/2007    Past Medical History:  Diagnosis Date   Anemia    Aortic arch atherosclerosis ([place]) 08/29/2017   Cancer ([place])    Carpal tunnel syndrome 02/19/2007   Qualifier: Diagnosis of  By: Samara Snide     Coronary artery disease    a. Remote PTCA of the PDA;  b. 02/2009 PCI/CBA to the D1;  c. 11/2011 Cath: LM nl, LAD 20p, 46m D1 patent, RI 20p, LCX nl, RCA 30d, EF 55-65%.// Myoview 04/2020: EF 610 no ischemia or infarction, low risk    CTS (carpal tunnel syndrome)    Diabetes mellitus [place] complication [place]    Dysphagia 11/13/2018   GERD (gastroesophageal reflux disease)    History of papillary adenocarcinoma of thyroid 1992   right thyroid lobectomy   History of thyroid cancer    Hypertension    Hypothyroid    Hypothyroidism 02/19/2007   Qualifier: Diagnosis of  By: OSamara Snide    IBS (irritable bowel syndrome) 12/27/2014   Nodule of right lung 08/29/2017   3 mm stable nodule right upper lobe on CTA Chest 08/28/17   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 08/29/2017   Subscapular pain, left 08/29/2017    Past Surgical History:  Procedure Laterality Date   ANGIOPLASTY     PDA 90-40 %   BARTHOLIN GLAND CYST EXCISION  09-13-2004   Keratosis- no CA   BREAST BIOPSY     LEFT SPOT  REMOVED   BREAST EXCISIONAL BIOPSY Left    CARDIAC CATHETERIZATION  03/15/2009   stenosis of diagonal branch of LAD- PTCA performed   CHOLECYSTECTOMY  08-23-2002   CORONARY ANGIOPLASTY     ENDOMETRIAL BIOPSY  02-20-1998   LEFT HEART CATHETERIZATION WITH CORONARY ANGIOGRAM N/A 11/26/2011   Procedure: LEFT HEART CATHETERIZATION WITH CORONARY ANGIOGRAM;  Surgeon: Peter M Martinique, MD;  Location: [facility] CATH LAB;  Service: Cardiovascular;  Laterality: N/A;   PTCA  10/23/2000 and 03/15/09   THYROIDECTOMY Right 10-06-1991   Hashimotos and papillary carcinoma   TONSILLECTOMY     TUBAL LIGATION      Current Outpatient Medications  Medication Sig Dispense Refill   Accu-Chek FastClix Lancets MISC USE ONE LANCET TO TEST THREE TIMES A DAY 102 each 4   aspirin 81 MG chewable tablet Chew 81 mg by mouth daily.     atorvastatin (LIPITOR) 20 MG tablet TAKE ONE TABLET BY MOUTH DAILY AT 6PM 90 tablet 2    Blood Glucose Monitoring Suppl (ACCU-CHEK GUIDE) w/Device KIT USE AS DIRECTED IN THE MORNING, AT NOON AND AT BEDTIME Strength: w/Device 1 kit 0   carvedilol (COREG) 25 MG tablet TAKE ONE TABLET BY MOUTH TWICE A DAY WITH MEALS 180 tablet 1   empagliflozin (JARDIANCE) 25 MG TABS tablet Take 1 tablet (25 mg total) by mouth daily. 90 tablet 1   ferrous sulfate 325 (65 FE) MG tablet Take 325 mg by mouth daily with breakfast.     fluticasone (FLONASE) 50 MCG/ACT nasal spray Place into the nose.     glucose blood (ACCU-CHEK GUIDE) test strip USE ONE STRIP TO TEST THREE TIMES A DAY 100 strip 5   insulin glargine (LANTUS) 100 UNIT/ML injection Inject 0.05 mLs (5 Units total) into the skin daily. 10 mL 4   Insulin Syringe-Needle U-100 (RELION INSULIN SYRINGE) 31G X 15/64" 1 ML MISC USE  AS DIRECTED TID FOR CBG CHECK 100 each 5   isosorbide mononitrate (IMDUR) 60 MG 24 hr tablet Take 1.5 tablets (90 mg total) by mouth daily. 135 tablet 1   lisinopril-hydrochlorothiazide (ZESTORETIC) 20-12.5 MG tablet Take 1 tablet by mouth daily. 90 tablet 1   metFORMIN (GLUCOPHAGE-XR) 500 MG 24 hr tablet Take 1 tablet (500 mg total) by mouth 2 (two) times daily. 180 tablet 1   Multiple Vitamin (MULTIVITAMIN WITH MINERALS) TABS tablet Take 1 tablet by mouth daily. Reported on 04/02/2016     No current facility-administered medications for this visit.     ALLERGIES: Codeine, Sulfamethoxazole-trimethoprim, and Tramadol  Family History  Problem Relation Age of Onset   Diabetes Mother        Deceased- hypoglycemic coma    Hypertension Mother    Other Father        unaware of his health history - died when pt was 81 yr old.   Heart attack Sister    Diabetes Mellitus II Brother    Heart attack Brother    Heart attack Brother    Diabetes Mellitus II Brother    Epilepsy Brother    Breast cancer Daughter 23   Cancer Granddaughter     Social History   Socioeconomic History   Marital status: Widowed    Spouse name:  Not on file   Number of children: 6   Years of education: 10   Highest education level: Not on file  Occupational History   Occupation: Housekeeper    Employer: [employer]  Tobacco Use   Smoking status: Former  Types: Cigarettes    Quit date: 12/23/1980    Years since quitting: 41.5   Smokeless tobacco: Former    Types: Chew    Quit date: 08/23/1994  Vaping Use   Vaping Use: Never used  Substance and Sexual Activity   Alcohol use: No   Drug use: No   Sexual activity: Not Currently    Birth control/protection: Surgical  Other Topics Concern   Not on file  Social History Narrative   Lives in [place] with Granddaughter.  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  She is not routinely exercising.   Has her GED   Social Determinants of Radio broadcast assistant Strain: Not on file  Food Insecurity: Not on file  Transportation Needs: Not on file  Physical Activity: Not on file  Stress: Not on file  Social Connections: Not on file  Intimate Partner Violence: Not on file    Review of Systems  All other systems reviewed and are negative.   PHYSICAL EXAMINATION:    LMP 11/24/1992     General appearance: alert, cooperative and appears stated age  7. Uterine prolapse We discussed options again of doing nothing, reducing the prolapse as needed, trial of pessary and surgery. Discussed the different type of pessaries and need for f/u. Discussed option of colpocleisis or TLH and surgery to support the apex of her vagina. After discussion, she has decided not to do anything at this time Reviewed avoiding heavy lifting and straining Reviewed the importance of being able to empty her bladder and reducing the prolapse as needed She will return if she decides she wants to try the pessary, or desires surgery  2. Midline cystocele

## 2022-07-08 ENCOUNTER — Other Ambulatory Visit: Payer: Self-pay | Admitting: Family Medicine

## 2022-07-10 ENCOUNTER — Ambulatory Visit: Payer: Self-pay

## 2022-07-10 DIAGNOSIS — M1990 Unspecified osteoarthritis, unspecified site: Secondary | ICD-10-CM | POA: Diagnosis not present

## 2022-07-10 DIAGNOSIS — Z794 Long term (current) use of insulin: Secondary | ICD-10-CM | POA: Diagnosis not present

## 2022-07-10 DIAGNOSIS — Z7982 Long term (current) use of aspirin: Secondary | ICD-10-CM | POA: Diagnosis not present

## 2022-07-10 DIAGNOSIS — Z7984 Long term (current) use of oral hypoglycemic drugs: Secondary | ICD-10-CM | POA: Diagnosis not present

## 2022-07-10 DIAGNOSIS — R32 Unspecified urinary incontinence: Secondary | ICD-10-CM | POA: Diagnosis not present

## 2022-07-10 DIAGNOSIS — I252 Old myocardial infarction: Secondary | ICD-10-CM | POA: Diagnosis not present

## 2022-07-10 DIAGNOSIS — I25118 Atherosclerotic heart disease of native coronary artery with other forms of angina pectoris: Secondary | ICD-10-CM | POA: Diagnosis not present

## 2022-07-10 DIAGNOSIS — E119 Type 2 diabetes mellitus without complications: Secondary | ICD-10-CM | POA: Diagnosis not present

## 2022-07-10 DIAGNOSIS — I10 Essential (primary) hypertension: Secondary | ICD-10-CM | POA: Diagnosis not present

## 2022-07-10 NOTE — Patient Outreach (Signed)
  Care Coordination   Follow Up Visit Note   07/10/2022 Name: [name] MRN: [id number] DOB: [birth year]  [name] is a 81 y.o. year old female who sees Michele Feil, MD for primary care. I spoke with  [name] by phone today What matters to the patients health and wellness today?  I want to make sure that my diabetes stays under control   Goals Addressed               This Visit's Progress     " I want to make sure my diabetes stays under control" (pt-stated)        Care Coordination Interventions: Reviewed medications with patient and discussed importance of medication adherence Provided patient with verbal education related to hypo and hyperglycemia and importance of correct treatment Today, I had a conversation with [name] regarding her health. She shared that her main priority is to manage her diabetes effectively by maintaining a healthy diet. She diligently monitors her blood sugar levels every morning, which have consistently ranged between 90 and 112. We also discussed the possibility of experiencing low blood sugar levels, and I provided her with tips on managing them. Lastly, I plan to make one final call to ensure she maintains good numbers before our subsequent encounter.          SDOH assessments and interventions completed:   Yes   Care Coordination Interventions Activated:  Yes Care Coordination Interventions:   Yes, provided  Follow up plan: Follow up call scheduled for 08/14/09 10 am  Encounter Outcome:  Pt. Visit Completed   Michele Arms RN, BSN, [facility] Care Management Coordinator [facility]   Phone: [phone]

## 2022-07-10 NOTE — Patient Instructions (Signed)
Visit Information  Thank you for taking time to visit with me today. Please don't hesitate to contact me if I can be of assistance to you.   Following are the goals we discussed today:   Goals Addressed               This Visit's Progress     " I want to make sure my diabetes stays under control" (pt-stated)        Care Coordination Interventions: Reviewed medications with patient and discussed importance of medication adherence Provided patient with verbal education related to hypo and hyperglycemia and importance of correct treatment Today, I had a conversation with Mrs. Leming regarding her health. She shared that her main priority is to manage her diabetes effectively by maintaining a healthy diet. She diligently monitors her blood sugar levels every morning, which have consistently ranged between 90 and 112. We also discussed the possibility of experiencing low blood sugar levels, and I provided her with tips on managing them. Lastly, I plan to make one final call to ensure she maintains good numbers before our subsequent encounter.          Our next appointment is by telephone on 08/14/22 @ 10 am  Please call the care guide team at [phone] if you need to cancel or reschedule your appointment.     Patient verbalizes understanding of instructions and care plan provided today and agrees to view in [place]. Active MyChart status and patient understanding of how to access instructions and care plan via MyChart confirmed with patient.

## 2022-08-05 ENCOUNTER — Other Ambulatory Visit: Payer: Self-pay | Admitting: *Deleted

## 2022-08-05 DIAGNOSIS — D638 Anemia in other chronic diseases classified elsewhere: Secondary | ICD-10-CM

## 2022-08-06 ENCOUNTER — Other Ambulatory Visit: Payer: Self-pay

## 2022-08-06 ENCOUNTER — Inpatient Hospital Stay: Payer: Medicare PPO | Attending: Hematology and Oncology

## 2022-08-06 DIAGNOSIS — D649 Anemia, unspecified: Secondary | ICD-10-CM | POA: Diagnosis not present

## 2022-08-06 DIAGNOSIS — D638 Anemia in other chronic diseases classified elsewhere: Secondary | ICD-10-CM

## 2022-08-06 DIAGNOSIS — N189 Chronic kidney disease, unspecified: Secondary | ICD-10-CM | POA: Insufficient documentation

## 2022-08-06 LAB — CBC WITH DIFFERENTIAL (CANCER CENTER ONLY)
Abs Immature Granulocytes: 0.01 10*3/uL (ref 0.00–0.07)
Basophils Absolute: 0.1 10*3/uL (ref 0.0–0.1)
Basophils Relative: 1 %
Eosinophils Absolute: 0.1 10*3/uL (ref 0.0–0.5)
Eosinophils Relative: 2 %
HCT: 32 % — ABNORMAL LOW (ref 36.0–46.0)
Hemoglobin: 10.1 g/dL — ABNORMAL LOW (ref 12.0–15.0)
Immature Granulocytes: 0 %
Lymphocytes Relative: 35 %
Lymphs Abs: 1.8 10*3/uL (ref 0.7–4.0)
MCH: 28.7 pg (ref 26.0–34.0)
MCHC: 31.6 g/dL (ref 30.0–36.0)
MCV: 90.9 fL (ref 80.0–100.0)
Monocytes Absolute: 0.7 10*3/uL (ref 0.1–1.0)
Monocytes Relative: 14 %
Neutro Abs: 2.4 10*3/uL (ref 1.7–7.7)
Neutrophils Relative %: 48 %
Platelet Count: 143 10*3/uL — ABNORMAL LOW (ref 150–400)
RBC: 3.52 MIL/uL — ABNORMAL LOW (ref 3.87–5.11)
RDW: 15.5 % (ref 11.5–15.5)
WBC Count: 5 10*3/uL (ref 4.0–10.5)
nRBC: 0 % (ref 0.0–0.2)

## 2022-08-06 LAB — CMP (CANCER CENTER ONLY)
ALT: 21 U/L (ref 0–44)
AST: 22 U/L (ref 15–41)
Albumin: 4 g/dL (ref 3.5–5.0)
Alkaline Phosphatase: 69 U/L (ref 38–126)
Anion gap: 6 (ref 5–15)
BUN: 21 mg/dL (ref 8–23)
CO2: 30 mmol/L (ref 22–32)
Calcium: 9.4 mg/dL (ref 8.9–10.3)
Chloride: 106 mmol/L (ref 98–111)
Creatinine: 1.11 mg/dL — ABNORMAL HIGH (ref 0.44–1.00)
GFR, Estimated: 51 mL/min — ABNORMAL LOW (ref >60)
Glucose, Bld: 162 mg/dL — ABNORMAL HIGH (ref 70–99)
Potassium: 4.6 mmol/L (ref 3.5–5.1)
Sodium: 142 mmol/L (ref 135–145)
Total Bilirubin: 0.7 mg/dL (ref 0.3–1.2)
Total Protein: 6.9 g/dL (ref 6.5–8.1)

## 2022-08-06 LAB — FERRITIN: Ferritin: 16 ng/mL (ref 11–307)

## 2022-08-06 LAB — IRON AND IRON BINDING CAPACITY (CC-WL,HP ONLY)
Iron: 97 ug/dL (ref 28–170)
Saturation Ratios: 28 % (ref 10.4–31.8)
TIBC: 344 ug/dL (ref 250–450)
UIBC: 247 ug/dL (ref 148–442)

## 2022-08-07 NOTE — Progress Notes (Signed)
Patient Care Team: [name], MD as PCP - General (Family Medicine) Josue Hector, MD as PCP - Cardiology (Cardiology) Lazaro Arms, RN as Case Manager Talbert Nan Francesca Jewett, MD as Consulting Physician (Obstetrics and Gynecology)  DIAGNOSIS: No diagnosis found.  SUMMARY OF ONCOLOGIC HISTORY: Oncology History   No history exists.    CHIEF COMPLIANT: Follow-up of anemia  INTERVAL HISTORY: [name] is a 81 y.o. with above-mentioned history of anemia previously treated with oral and IV iron. She presents to the clinic today for a follow-up.   ALLERGIES:  is allergic to codeine, sulfamethoxazole-trimethoprim, and tramadol.  MEDICATIONS:  Current Outpatient Medications  Medication Sig Dispense Refill   Accu-Chek FastClix Lancets MISC USE ONE LANCET TO TEST THREE TIMES A DAY 102 each 4   aspirin 81 MG chewable tablet Chew 81 mg by mouth daily.     atorvastatin (LIPITOR) 20 MG tablet TAKE ONE TABLET BY MOUTH DAILY AT 6PM 90 tablet 2   Blood Glucose Monitoring Suppl (ACCU-CHEK GUIDE) w/Device KIT USE AS DIRECTED IN THE MORNING, AT NOON AND AT BEDTIME Strength: w/Device 1 kit 0   carvedilol (COREG) 25 MG tablet TAKE ONE TABLET BY MOUTH TWICE A DAY WITH MEALS 180 tablet 1   empagliflozin (JARDIANCE) 25 MG TABS tablet Take 1 tablet (25 mg total) by mouth daily. 90 tablet 1   ferrous sulfate 325 (65 FE) MG tablet Take 325 mg by mouth daily with breakfast.     fluticasone (FLONASE) 50 MCG/ACT nasal spray Place into the nose.     glucose blood (ACCU-CHEK GUIDE) test strip USE ONE STRIP TO TEST THREE TIMES A DAY 100 strip 5   insulin glargine (LANTUS) 100 UNIT/ML injection Inject 0.05 mLs (5 Units total) into the skin daily. 10 mL 4   Insulin Syringe-Needle U-100 (RELION INSULIN SYRINGE) 31G X 15/64" 1 ML MISC USE  AS DIRECTED TID FOR CBG CHECK 100 each 5   isosorbide mononitrate (IMDUR) 60 MG 24 hr tablet Take 1.5 tablets (90 mg total) by mouth daily. 135 tablet 1    lisinopril-hydrochlorothiazide (ZESTORETIC) 20-12.5 MG tablet Take 1 tablet by mouth daily. 90 tablet 1   metFORMIN (GLUCOPHAGE-XR) 500 MG 24 hr tablet Take 1 tablet (500 mg total) by mouth 2 (two) times daily. 180 tablet 1   Multiple Vitamin (MULTIVITAMIN WITH MINERALS) TABS tablet Take 1 tablet by mouth daily. Reported on 04/02/2016     No current facility-administered medications for this visit.    PHYSICAL EXAMINATION: ECOG PERFORMANCE STATUS: {CHL ONC ECOG [phone]}  There were no vitals filed for this visit. There were no vitals filed for this visit.  BREAST:*** No palpable masses or nodules in either right or left breasts. No palpable axillary supraclavicular or infraclavicular adenopathy no breast tenderness or nipple discharge. (exam performed in the presence of a chaperone)  LABORATORY DATA:  I have reviewed the data as listed    Latest Ref Rng & Units 08/06/2022    9:01 AM 11/20/2021    8:56 AM 11/02/2021   11:10 AM  CMP  Glucose 70 - 99 mg/dL 162  227  116   BUN 8 - 23 mg/dL '21  18  25   ' Creatinine 0.44 - 1.00 mg/dL 1.11  1.13  1.54   Sodium 135 - 145 mmol/L 142  140  143   Potassium 3.5 - 5.1 mmol/L 4.6  3.9  5.1   Chloride 98 - 111 mmol/L 106  100  104   CO2  22 - 32 mmol/L '30  26  27   ' Calcium 8.9 - 10.3 mg/dL 9.4  9.5  9.8   Total Protein 6.5 - 8.1 g/dL 6.9   6.5   Total Bilirubin 0.3 - 1.2 mg/dL 0.7   0.5   Alkaline Phos 38 - 126 U/L 69   79   AST 15 - 41 U/L 22   14   ALT 0 - 44 U/L 21   13     Lab Results  Component Value Date   WBC 5.0 08/06/2022   HGB 10.1 (L) 08/06/2022   HCT 32.0 (L) 08/06/2022   MCV 90.9 08/06/2022   PLT 143 (L) 08/06/2022   NEUTROABS 2.4 08/06/2022    ASSESSMENT & PLAN:  No problem-specific Assessment & Plan notes found for this encounter.    No orders of the defined types were placed in this encounter.  The patient has a good understanding of the overall plan. she agrees with it. she will call with any problems that  may develop before the next visit here. Total time spent: 30 mins including face to face time and time spent for planning, charting and co-ordination of care   Suzzette Righter, [place] 08/07/22    I Gardiner Coins am scribing for Dr. Lindi Adie  ***

## 2022-08-08 ENCOUNTER — Other Ambulatory Visit: Payer: Self-pay

## 2022-08-08 ENCOUNTER — Inpatient Hospital Stay: Payer: Medicare PPO | Admitting: Hematology and Oncology

## 2022-08-08 DIAGNOSIS — D638 Anemia in other chronic diseases classified elsewhere: Secondary | ICD-10-CM

## 2022-08-08 DIAGNOSIS — D649 Anemia, unspecified: Secondary | ICD-10-CM | POA: Diagnosis not present

## 2022-08-08 DIAGNOSIS — N189 Chronic kidney disease, unspecified: Secondary | ICD-10-CM | POA: Diagnosis not present

## 2022-08-08 NOTE — Assessment & Plan Note (Signed)
History of diverticulitis.andhemorrhoids. Last colonoscopy was 07/02/2013   Work-up did not reveal thalassemia. IV iron treatment: March 2020  Lab review 01/19/2019: Hemoglobin 9.3, platelets 126 04/14/2020: Hemoglobin 9.9, platelets 131, MCV 89.7, B12 527, iron saturation 26%, ferritin 18, creatinine 1.04, calcium 00.3, folic acid 49.6 01/08/4352: SPEP: No M protein, kappa 32.4, ratio 1.43 erythropoietin 68.1  08/07/2020: Hemoglobin 9.4, MCV 87.9, platelets 139, creatinine 1.11, calcium 10.6, ferritin 30, iron saturation 23% 08/03/21: Hb 9.6, MCV: 91, Cr: 1.38, Ferritin:10, Iron Sat: 26%,  08/06/2022: Hemoglobin 10.1, MCV 90.9, platelets 143, iron saturation 28%, ferritin 16, creatinine 1.11  Possiblecauseis anemia of chronic kidney disease.  Given the fact that the hemoglobin continues to remain stable and she is not symptomatic we will continue to watch and monitor.  Recheck labs in 1 year and follow-up.

## 2022-08-12 IMAGING — DX DG SHOULDER 2+V*L*
4 series · 4 of 4 positions shown · non-contrast
Comparison: None.

CLINICAL DATA: Chronic pain

EXAM:
LEFT SHOULDER - 2+ VIEW

[shoulder grashey]
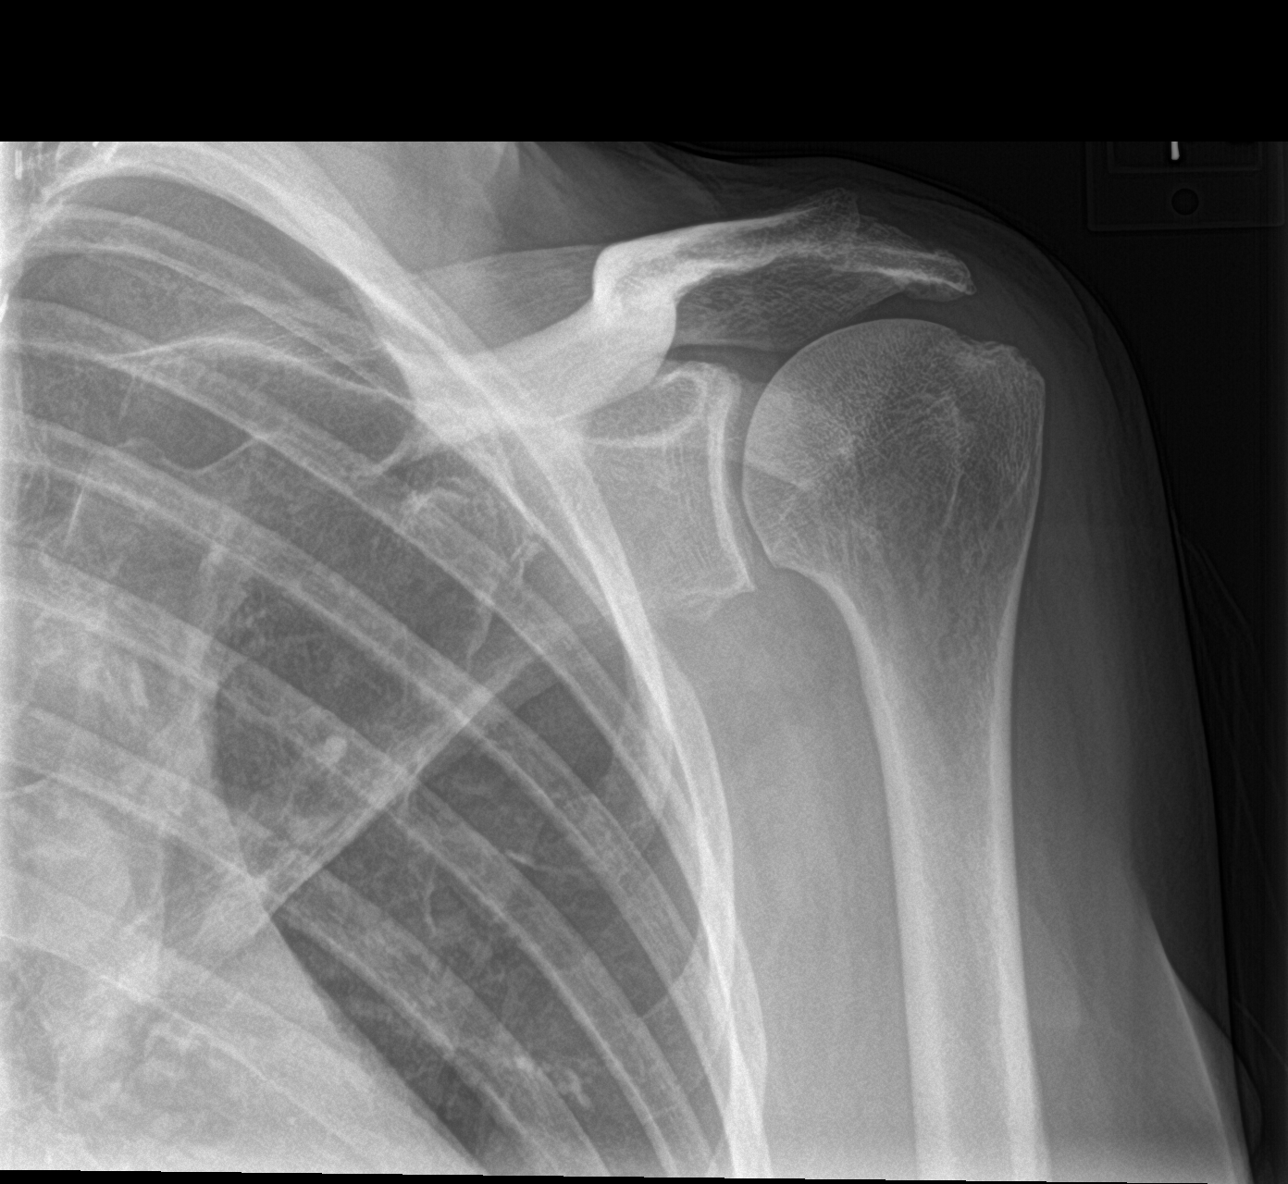

[shoulder axillary]
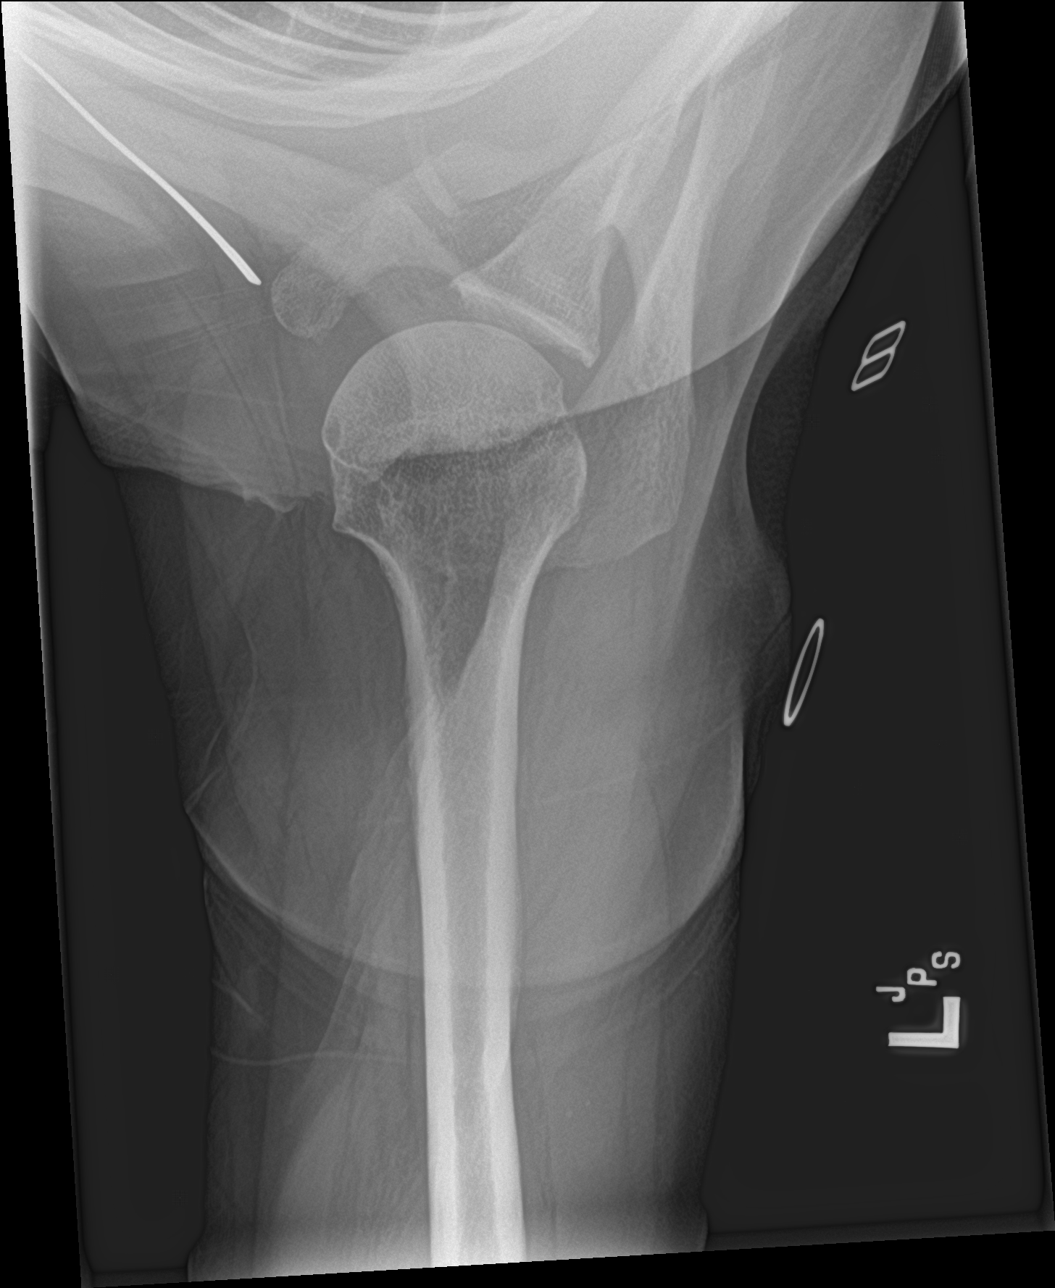

[shoulder ap neutral]
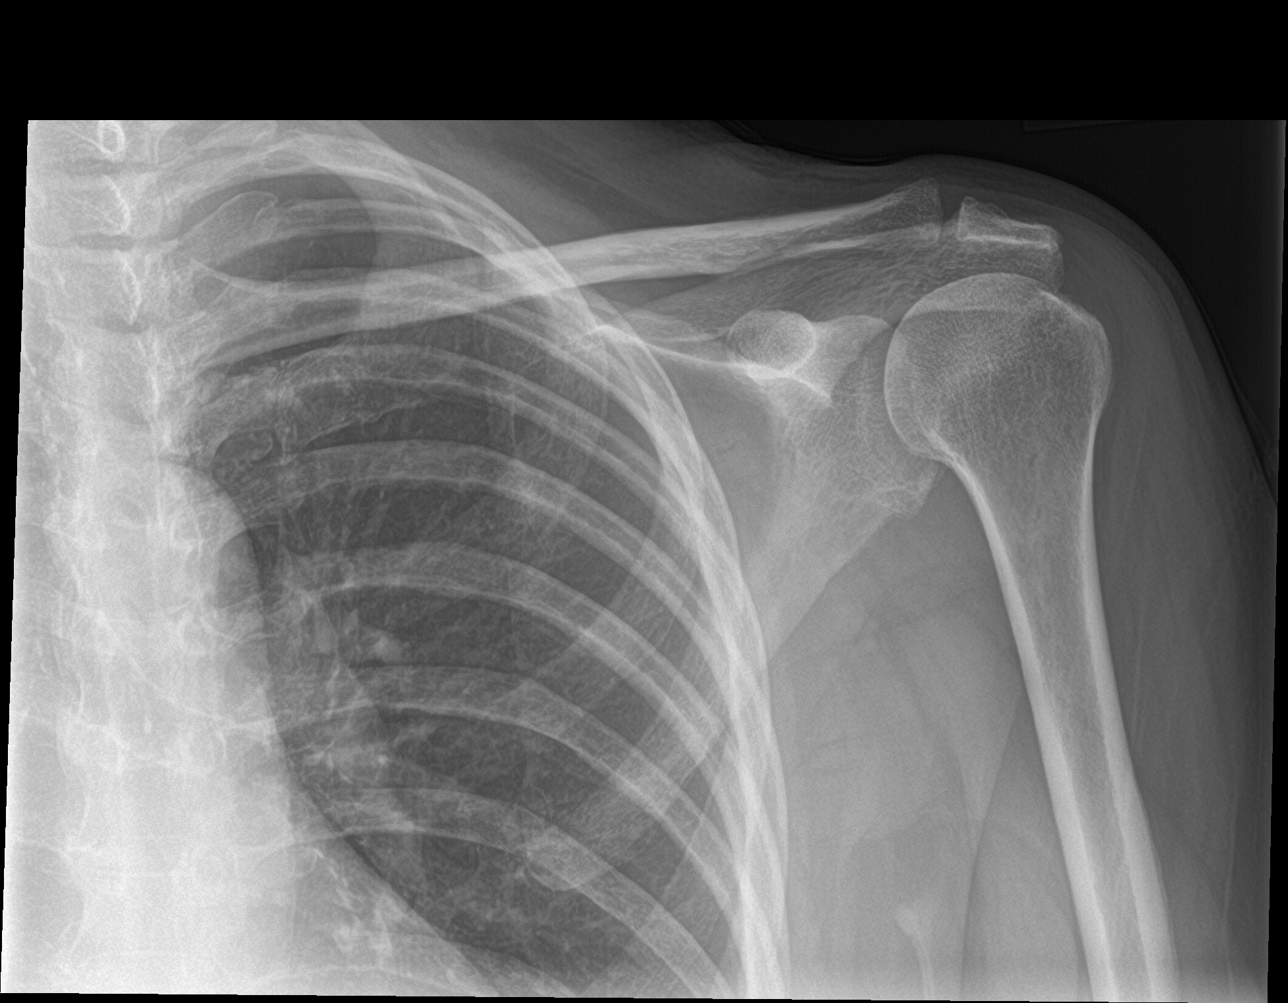

[shoulder y view]
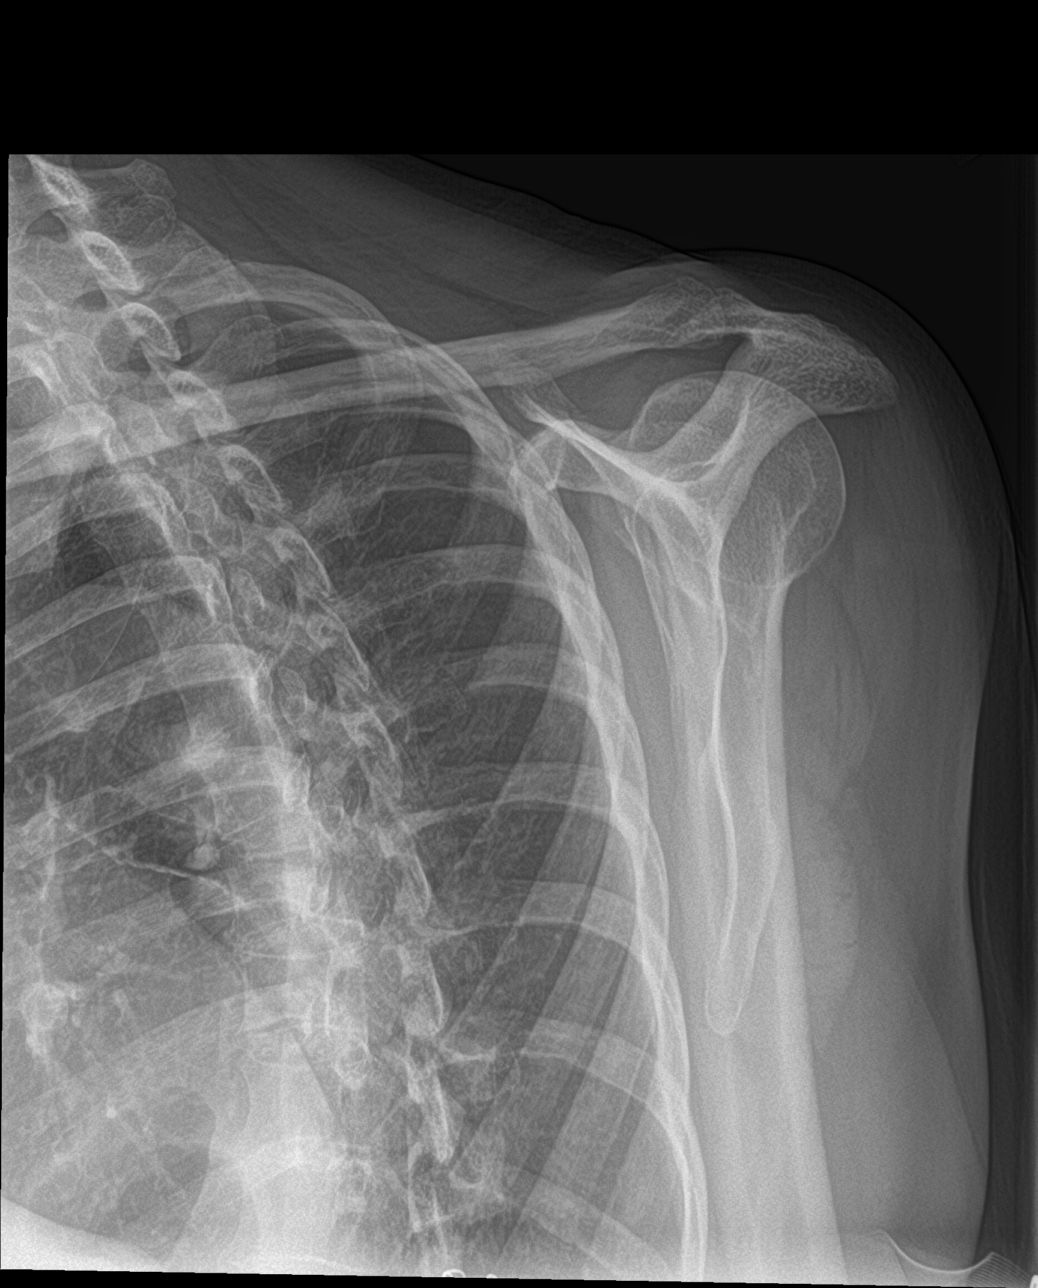

[4 of 4 positions shown; findings below may reference images not displayed]

FINDINGS: There is no evidence of fracture or dislocation. There is no
evidence of arthropathy or other focal bone abnormality. Soft
tissues are unremarkable.
IMPRESSION: No radiographic abnormality is seen in the left shoulder.

## 2022-08-14 ENCOUNTER — Ambulatory Visit: Payer: Self-pay

## 2022-08-14 NOTE — Patient Instructions (Signed)
Visit Information  Thank you for taking time to visit with me today. Please don't hesitate to contact me if I can be of assistance to you.   Following are the goals we discussed today:   Goals Addressed               This Visit's Progress     " I want to make sure my diabetes and hypertension stays under control" (pt-stated)        Care Coordination Interventions: Reviewed medications with patient and discussed importance of medication adherence Provided patient with verbal education related to hypo and hyperglycemia and importance of correct treatment Evaluation of current treatment plan related to hypertension self management and patient's adherence to plan as established by provider Active listening / Reflection utilized  Emotional Support Provided I spoke with [name] and she is doing well. She is consistently taking her medication and monitoring her vitals. Her blood sugar was 100 this morning and her blood pressure was 127/64. I advised her to maintain her current routine. She is eating and sleeping well and still working with the babies at the daycare. She requested that I continue to call her for follow-up appointments, but we will schedule them every three months.        Our next appointment is by telephone on 10/24 at 9 am  Please call the care guide team at [phone] if you need to cancel or reschedule your appointment.    Patient verbalizes understanding of instructions and care plan provided today and agrees to view in [place]. Active MyChart status and patient understanding of how to access instructions and care plan via MyChart confirmed with patient.     Lazaro Arms RN, BSN, [facility]   Phone: [phone]

## 2022-08-14 NOTE — Patient Outreach (Signed)
  Care Coordination   Follow Up Visit Note   08/14/2022 Name: [name] MRN: [id number] DOB: November 01, 1941  [name] is a 81 y.o. year old female who sees Michele Feil, MD for primary care. I spoke with  [name] by phone today  What matters to the patients health and wellness today?  Continue to manage my diabetes and hypertension.    Goals Addressed               This Visit's Progress     " I want to make sure my diabetes and hypertension stays under control" (pt-stated)        Care Coordination Interventions: Reviewed medications with patient and discussed importance of medication adherence Provided patient with verbal education related to hypo and hyperglycemia and importance of correct treatment Evaluation of current treatment plan related to hypertension self management and patient's adherence to plan as established by provider Active listening / Reflection utilized  Emotional Support Provided I spoke with [name] and she is doing well. She is consistently taking her medication and monitoring her vitals. Her blood sugar was 100 this morning and her blood pressure was 127/64. I advised her to maintain her current routine. She is eating and sleeping well and still working with the babies at the daycare. She requested that I continue to call her for follow-up appointments, but we will schedule them every three months.        SDOH assessments and interventions completed:  No     Care Coordination Interventions Activated:  Yes  Care Coordination Interventions:  Yes, provided   Follow up plan: Follow up call scheduled for 10/24  at 9 am    Encounter Outcome:  Pt. Visit Completed   Lazaro Arms RN, BSN, [facility]   Phone: [phone]

## 2022-09-08 ENCOUNTER — Other Ambulatory Visit: Payer: Self-pay | Admitting: Family Medicine

## 2022-09-09 ENCOUNTER — Encounter (HOSPITAL_COMMUNITY): Payer: Self-pay

## 2022-09-09 ENCOUNTER — Ambulatory Visit (INDEPENDENT_AMBULATORY_CARE_PROVIDER_SITE_OTHER): Payer: Medicare PPO

## 2022-09-09 ENCOUNTER — Ambulatory Visit (HOSPITAL_COMMUNITY)
Admission: EM | Admit: 2022-09-09 | Discharge: 2022-09-09 | Disposition: A | Discharge status: Home / Self Care | Payer: Medicare PPO | Attending: Family Medicine | Admitting: Family Medicine

## 2022-09-09 DIAGNOSIS — M25551 Pain in right hip: Secondary | ICD-10-CM

## 2022-09-09 DIAGNOSIS — M545 Low back pain, unspecified: Secondary | ICD-10-CM

## 2022-09-09 DIAGNOSIS — M5441 Lumbago with sciatica, right side: Secondary | ICD-10-CM

## 2022-09-09 DIAGNOSIS — R102 Pelvic and perineal pain: Secondary | ICD-10-CM | POA: Diagnosis not present

## 2022-09-09 MED ORDER — BACLOFEN 5 MG PO TABS: 5.0000 mg | ORAL_TABLET | Freq: Two times a day (BID) | ORAL | 0 refills | Status: DC | PRN

## 2022-09-09 NOTE — Discharge Instructions (Addendum)
The x-rays did not show any broken bones or cyst in the bones  Take Tylenol extra strength as needed for the pain  If that is not helping then you can try the baclofen 5 mg--1 tablet every 12 hours as needed for muscle pain or muscle spasm  Please call your primary care office tomorrow for follow-up on this issue

## 2022-09-09 NOTE — ED Triage Notes (Signed)
Pt is here for right hip pain since today. Pt denies having a fall

## 2022-09-09 NOTE — ED Provider Notes (Signed)
[facility]    CSN: [id number] Arrival date & time: 09/09/22  1627      History   Chief Complaint Chief Complaint  Patient presents with   Hip Pain    HPI [name] is a 81 y.o. female.    Hip Pain   Here with right buttock and low back pain.  She states her hip is begun hurting this morning and then it worsened this afternoon.  No history of trauma recently.  No fever and no dysuria and no rash.  She has put some Biofreeze on it.  She has a history of heart disease, CKD, and diabetes.  She states diabetes has been fairly well controlled.  She does not tolerate tramadol, codeine, or hydrocodone.  Past Medical History:  Diagnosis Date   Anemia    Aortic arch atherosclerosis ([place]) 08/29/2017   Cancer ([place])    Carpal tunnel syndrome 02/19/2007   Qualifier: Diagnosis of  By: Samara Snide     Coronary artery disease    a. Remote PTCA of the PDA;  b. 02/2009 PCI/CBA to the D1;  c. 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   History of thyroid cancer    Hypertension    Hypothyroid    Hypothyroidism 02/19/2007   Qualifier: Diagnosis of  By: OSamara Snide    IBS (irritable bowel syndrome) 12/27/2014   Nodule of right lung 08/29/2017   3 mm stable nodule right upper lobe on CTA Chest 08/28/17   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 08/29/2017   Subscapular pain, left 08/29/2017    Patient Active Problem List   Diagnosis Date Noted   Uterine prolapse 06/18/2022   Corn of toe 04/16/2022   Shoulder pain 11/02/2021   Skin lesion of breast 09/23/2021   Inclusion cyst 04/03/2021   Arthritis of ankle, right 11/15/2019    Stage 3b chronic kidney disease (CKD) ([place] 11/15/2019   History of MI (myocardial infarction) 06/18/2019   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   Iron deficiency anemia 02/25/2019   Thrombocytopenia ([place] 02/09/2019   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 08/29/2017   Nodule of right lung 08/29/2017   A[place] arch atherosclerosis ([place] 08/29/2017   Liver cyst    Left thyroid nodule 07/27/2014   History of thyroid cancer 04/12/2013   Diabetes mellitus type 2, controlled ([place] 02/19/2007   HYPERCHOLESTEROLEMIA 02/19/2007   Anemia of chronic disease 02/19/2007   Essential hypertension 02/19/2007   Coronary artery disease involving native coronary artery of native heart without angina pectoris 02/19/2007    Past Surgical History:  Procedure Laterality Date   ANGIOPLASTY     PDA 90-40 %   BARTHOLIN GLAND CYST EXCISION  09-13-2004   Keratosis- no CA   BREAST BIOPSY     LEFT SPOT REMOVED   BREAST EXCISIONAL BIOPSY Left    CARDIAC CATHETERIZATION  03/15/2009   stenosis of diagonal branch of LAD- PTCA performed   CHOLECYSTECTOMY  08-23-2002   CORONARY ANGIOPLASTY     ENDOMETRIAL BIOPSY  02-20-1998   LEFT HEART CATHETERIZATION WITH CORONARY ANGIOGRAM N/A 11/26/2011  Procedure: LEFT HEART CATHETERIZATION WITH CORONARY ANGIOGRAM;  Surgeon: Peter M Martinique, MD;  Location[facility] CATH LAB;  Service: Cardiovascular;  Laterality: N/A;   PTCA  10/23/2000 and 03/15/09   THYROIDECTOMY Right 10-06-1991   Hashimotos and papillary carcinoma   TONSILLECTOMY     TUBAL LIGATION      OB History     Gravida  7   Para  7   Term  7   Preterm      AB      Living  7      SAB      IAB      Ectopic      Multiple      Live Births  5            Home Medications    Prior to Admission medications   Medication Sig Start Date End Date Taking? Authorizing Provider  Baclofen 5 MG TABS Take 5 mg by mouth every 12 (twelve) hours as needed (muscle  pain/spasm). 09/09/22  Yes Barrett Henle, MD  Accu-Chek FastClix Lancets MISC USE ONE LANCET TO TEST THREE TIMES A DAY 07/25/21   [name], MD  aspirin 81 MG chewable tablet Chew 81 mg by mouth daily.    [provider]  atorvastatin (LIPITOR) 20 MG tablet TAKE ONE TABLET BY MOUTH DAILY AT [facility] 11/29/21   Andrena Mews T, MD  Blood Glucose Monitoring Suppl (ACCU-CHEK GUIDE) w/Device KIT USE AS DIRECTED IN THE MORNING, AT NOON AND AT BEDTIME Strength: w/Device 04/01/22   [name], MD  carvedilol (COREG) 25 MG tablet TAKE ONE TABLET BY MOUTH TWICE A DAY WITH MEALS 04/24/22   [name], MD  empagliflozin (JARDIANCE) 25 MG TABS tablet Take 1 tablet (25 mg total) by mouth daily. 03/12/22   [name], MD  ferrous sulfate 325 (65 FE) MG tablet Take 325 mg by mouth daily with breakfast.    [provider]  fluticasone (FLONASE) 50 MCG/ACT nasal spray Place into the nose. 03/12/13   [provider]  glucose blood (ACCU-CHEK GUIDE) test strip USE ONE STRIP TO TEST THREE TIMES A DAY 07/26/21   Andrena Mews T, MD  insulin glargine (LANTUS) 100 UNIT/ML injection Inject 0.05 mLs (5 Units total) into the skin daily. 07/08/22   [name], MD  Insulin Syringe-Needle U-100 (RELION INSULIN SYRINGE) 31G X 15/64" 1 ML MISC USE  AS DIRECTED TID FOR CBG CHECK 08/02/21   [name], MD  isosorbide mononitrate (IMDUR) 60 MG 24 hr tablet Take 1.5 tablets (90 mg total) by mouth daily. 07/04/22   Josue Hector, MD  lisinopril-hydrochlorothiazide (ZESTORETIC) 20-12.5 MG tablet Take 1 tablet by mouth daily. 04/15/22   [name], MD  metFORMIN (GLUCOPHAGE-XR) 500 MG 24 hr tablet Take 1 tablet (500 mg total) by mouth 2 (two) times daily. 09/09/22   [name], MD  Multiple Vitamin (MULTIVITAMIN WITH MINERALS) TABS tablet Take 1 tablet by mouth daily. Reported on 04/02/2016    [provider]    Family History Family History  Problem  Relation Age of Onset   Diabetes Mother        Deceased- hypoglycemic coma    Hypertension Mother    Other Father        unaware of his health history - died when pt was 81 yr old.   Heart attack Sister    Diabetes Mellitus II Brother    Heart attack  Brother    Heart attack Brother    Diabetes Mellitus II Brother    Epilepsy Brother    Breast cancer Daughter 11   Cancer Granddaughter     Social History Social History   Tobacco Use   Smoking status: Former    Types: Cigarettes    Quit date: 12/23/1980    Years since quitting: 41.7   Smokeless tobacco: Former    Types: Chew    Quit date: 08/23/1994  Vaping Use   Vaping Use: Never used  Substance Use Topics   Alcohol use: No   Drug use: No     Allergies   Codeine, Sulfamethoxazole-trimethoprim, and Tramadol   Review of Systems Review of Systems   Physical Exam Triage Vital Signs ED Triage Vitals  Enc Vitals Group     BP 09/09/22 1836 (!) 160/69     Pulse Rate 09/09/22 1836 69     Resp 09/09/22 1836 12     Temp 09/09/22 1836 98.1 F (36.7 C)     Temp Source 09/09/22 1836 Oral     SpO2 09/09/22 1836 96 %     Weight 09/09/22 1839 140 lb (63.5 kg)     Height --      Head Circumference --      Peak Flow --      Pain Score 09/09/22 1839 8     Pain Loc --      Pain Edu? --      Excl. in [place]? --    No data found.  Updated Vital Signs BP (!) 160/69 (BP Location: Left Arm)   Pulse 69   Temp 98.1 F (36.7 C) (Oral)   Resp 12   Wt 63.5 kg   LMP 11/24/1992   SpO2 96%   BMI 25.20 kg/m   Visual Acuity Right Eye Distance:   Left Eye Distance:   Bilateral Distance:    Right Eye Near:   Left Eye Near:    Bilateral Near:     Physical Exam Vitals reviewed.  Constitutional:      General: She is not in acute distress.    Appearance: She is not ill-appearing, toxic-appearing or diaphoretic.  Eyes:     Extraocular Movements: Extraocular movements intact.     Pupils: Pupils are equal, round, and reactive to  light.  Cardiovascular:     Rate and Rhythm: Normal rate and regular rhythm.     Heart sounds: No murmur heard. Pulmonary:     Effort: Pulmonary effort is normal.     Breath sounds: Normal breath sounds.  Musculoskeletal:        General: Tenderness (right LS area) present.  Skin:    Coloration: Skin is not jaundiced or pale.     Findings: No rash.  Neurological:     General: No focal deficit present.     Mental Status: She is alert and oriented to person, place, and time.  Psychiatric:        Behavior: Behavior normal.      UC Treatments / Results  Labs (all labs ordered are listed, but only abnormal results are displayed) Labs Reviewed - No data to display  EKG   Radiology DG Pelvis 1-2 Views  Result Date: 09/09/2022 CLINICAL DATA:  Right lower back and right hip pain EXAM: PELVIS - 1-2 VIEW COMPARISON:  None Available. FINDINGS: Supine frontal view of the pelvis includes both hips. No acute displaced fracture. Joint spaces are well preserved. Sacroiliac joints are unremarkable. Calcifications  overlying the lower pelvis likely represent degenerating fibroids. IMPRESSION: 1. No acute bony abnormality. Electronically Signed   By: Randa Ngo M.D.   On: 09/09/2022 19:18    Procedures Procedures (including critical care time)  Medications Ordered in UC Medications - No data to display  Initial Impression / Assessment and Plan / UC Course  I have reviewed the triage vital signs and the nursing notes.  Pertinent labs & imaging results that were available during my care of the patient were reviewed by me and considered in my medical decision making (see chart for details).        X-rays are benign.  She is going to take Tylenol preferentially.  I am also, send a low-dose of baclofen in in case the Tylenol does not help.  I have asked her to contact her primary care tomorrow for follow-up in case she ends up needing physical therapy or further evaluation Final Clinical  Impressions(s) / UC Diagnoses   Final diagnoses:  Right hip pain  Acute right-sided low back pain with right-sided sciatica     Discharge Instructions      The x-rays did not show any broken bones or cyst in the bones  Take Tylenol extra strength as needed for the pain  If that is not helping then you can try the baclofen 5 mg--1 tablet every 12 hours as needed for muscle pain or muscle spasm  Please call your primary care office tomorrow for follow-up on this issue     ED Prescriptions     Medication Sig Dispense Auth. Provider   Baclofen 5 MG TABS Take 5 mg by mouth every 12 (twelve) hours as needed (muscle pain/spasm). 15 tablet K[place]oa Swoboda, Gwenlyn Perking, MD      I have reviewed the PDMP during this encounter.   Barrett Henle, MD 09/09/22 2043

## 2022-09-20 ENCOUNTER — Other Ambulatory Visit: Payer: Self-pay | Admitting: Family Medicine

## 2022-09-23 NOTE — Progress Notes (Signed)
    SUBJECTIVE:   CHIEF COMPLAINT / HPI:   Ears A few days ago felt pressure full sensation in L ear then had ringing in her R ear.  No pain or hearing loss or fever.  Has noticed a little congestion and sometimes has allergy symptoms in the fall.  Has clariten at home but has not been taking   Hypertension  Checks at home and has been good.  Brings her readings to her PCP regularly.  Thinks she is taking all her medications.  Did not bring in and does not recall names exactly   PERTINENT  PMH / PSH:   CKD3, anemia iron def, hypertension, CAD, diabetes, thrombocytopenia  OBJECTIVE:   BP (!) 170/66   Pulse 70   Wt 135 lb 6.4 oz (61.4 kg)   LMP 11/24/1992   SpO2 92%   BMI 24.37 kg/m   Ears:  External ear exam shows no significant lesions or deformities.  Otoscopic examination reveals clear canals, tympanic membranes are intact bilaterally with mild bulging,  No retraction, inflammation or discharge. Hearing is grossly normal bilaterall Nose:  External nasal examination shows no deformity or inflammation. Nasal mucosa are pink and moist without lesions or exudates. No septal dislocation or dislocation.No obstruction to airflow. Mouth - no lesions, mucous membranes are moist, no decaying teeth   Neck:  No deformities, thyromegaly, masses, or tenderness noted.   Supple with full range of motion without pain. Mobility:able to get up and down from exam table without assistance or distress   ASSESSMENT/PLAN:   Essential hypertension Not exactly at goal but with low diastolic and not completely sure of her medications will not change and ask to follow up with PCP and bring medications    Ear Complaints Given recent onset and some allergic rhinitis symptoms and history will ask her to start her antihistamine and follow up if not improving.  No signs of acute infection or CVA.    Patient Instructions  Good to see you today - Thank you for coming in  Things we discussed today:  For  the ears Start clariten one daily If not better in a few weeks or if worsen with pain or loss of hearing come back for another check   Your goal blood pressure is less than 140/90.  Check your blood pressure several times a week.  If regularly higher than this please let me know - either with MyChart or leaving a phone message. Next visit please bring in your blood pressure cuff.     Please always bring your medication bottles to See Dr Gwendlyn Deutscher    Lind Covert, [place]

## 2022-09-24 ENCOUNTER — Encounter: Payer: Self-pay | Admitting: Family Medicine

## 2022-09-24 ENCOUNTER — Ambulatory Visit: Payer: Medicare PPO | Admitting: Family Medicine

## 2022-09-24 ENCOUNTER — Other Ambulatory Visit: Payer: Self-pay

## 2022-09-24 VITALS — BP 170/66 | HR 70 | Wt 135.4 lb

## 2022-09-24 DIAGNOSIS — H938X3 Other specified disorders of ear, bilateral: Secondary | ICD-10-CM | POA: Diagnosis not present

## 2022-09-24 DIAGNOSIS — I10 Essential (primary) hypertension: Secondary | ICD-10-CM

## 2022-09-24 NOTE — Patient Instructions (Signed)
Good to see you today - Thank you for coming in  Things we discussed today:  For the ears Start clariten one daily If not better in a few weeks or if worsen with pain or loss of hearing come back for another check   Your goal blood pressure is less than 140/90.  Check your blood pressure several times a week.  If regularly higher than this please let me know - either with MyChart or leaving a phone message. Next visit please bring in your blood pressure cuff.     Please always bring your medication bottles to See Dr Gwendlyn Deutscher

## 2022-09-24 NOTE — Assessment & Plan Note (Signed)
Not exactly at goal but with low diastolic and not completely sure of her medications will not change and ask to follow up with PCP and bring medications

## 2022-09-28 ENCOUNTER — Other Ambulatory Visit: Payer: Self-pay | Admitting: Family Medicine

## 2022-10-15 ENCOUNTER — Telehealth: Payer: Self-pay

## 2022-10-15 NOTE — Patient Outreach (Signed)
  Care Coordination   10/15/2022 Name: [name] MRN: [id number] DOB: Sep 28, 1941   Care Coordination Outreach Attempts:  An unsuccessful telephone outreach was attempted today to offer the patient information about available care coordination services as a benefit of their health plan.   Follow Up Plan:  Additional outreach attempts will be made to offer the patient care coordination information and services.   Encounter Outcome:  No Answer  Care Coordination Interventions Activated:  No   Care Coordination Interventions:  No, not indicated    Lazaro Arms RN, BSN, [facility]   Phone: [phone]

## 2022-10-26 ENCOUNTER — Other Ambulatory Visit: Payer: Self-pay | Admitting: Family Medicine

## 2022-10-29 ENCOUNTER — Ambulatory Visit: Payer: Medicare PPO | Admitting: Family Medicine

## 2022-10-29 ENCOUNTER — Encounter: Payer: Self-pay | Admitting: Family Medicine

## 2022-10-29 VITALS — BP 129/60 | HR 75 | Ht 62.0 in | Wt 133.2 lb

## 2022-10-29 DIAGNOSIS — Z23 Encounter for immunization: Secondary | ICD-10-CM | POA: Diagnosis not present

## 2022-10-29 DIAGNOSIS — E1121 Type 2 diabetes mellitus with diabetic nephropathy: Secondary | ICD-10-CM | POA: Diagnosis not present

## 2022-10-29 DIAGNOSIS — H6591 Unspecified nonsuppurative otitis media, right ear: Secondary | ICD-10-CM | POA: Diagnosis not present

## 2022-10-29 DIAGNOSIS — I10 Essential (primary) hypertension: Secondary | ICD-10-CM

## 2022-10-29 LAB — POCT GLYCOSYLATED HEMOGLOBIN (HGB A1C): HbA1c, POC (controlled diabetic range): 6.8 % (ref 0.0–7.0)

## 2022-10-29 NOTE — Assessment & Plan Note (Signed)
A1C checked today = 6.8 Still below goal. No medication adjustment at this time. F/U in 3-4 months for repeat A1C. Monitor CBG closely at home.

## 2022-10-29 NOTE — Assessment & Plan Note (Signed)
Stable on her current regimen. 

## 2022-10-29 NOTE — Assessment & Plan Note (Signed)
May be due to recent URI vs Eustachian tube dysfunction vs Allergy. No signs of acute bacterial infection. Good initial response to Claritin. Trial of Flonase. She will call if still no improvement and we will consider referral to ENT then. Flonase escribed. She agreed with the plan.

## 2022-10-29 NOTE — Progress Notes (Signed)
    SUBJECTIVE:   CHIEF COMPLAINT / HPI:   Ear discomfort: She c/o left ear fullness and ringing in the right ear, which started about three weeks ago. Clarithin improved it some, but now it has yet to respond to Clarithin. The ringing in her right ear is constant. It will only stop for a few minutes and start again.  She denies pain or hearing loss. No dizziness or falls, no headache currently, but sometimes.  DM/HTN: She is here for f/u. Compliant with all meds. No new concerns.  PERTINENT  PMH / PSH: PMHx reviewed  OBJECTIVE:   Vitals:   10/29/22 1043 10/29/22 1102  BP: (!) 145/64 129/60  Pulse:  75  Weight: 133 lb 4 oz (60.4 kg)   Height: '5\' 2"'$  (1.575 m)     Physical Exam Vitals and nursing note reviewed.  HENT:     Right Ear: Ear canal and external ear normal. There is no impacted cerumen.     Left Ear: Tympanic membrane, ear canal and external ear normal. There is no impacted cerumen.     Ears:     Comments: Right TM cloudy (fluid) Cardiovascular:     Rate and Rhythm: Normal rate and regular rhythm.     Heart sounds: Normal heart sounds. No murmur heard. Pulmonary:     Effort: Pulmonary effort is normal. No respiratory distress.     Breath sounds: Normal breath sounds. No wheezing.  Abdominal:     General: There is no distension.     Palpations: There is no mass.     Tenderness: There is no abdominal tenderness.      ASSESSMENT/PLAN:   Middle ear effusion, right May be due to recent URI vs Eustachian tube dysfunction vs Allergy. No signs of acute bacterial infection. Good initial response to Claritin. Trial of Flonase. She will call if still no improvement and we will consider referral to ENT then. Flonase escribed. She agreed with the plan.    Essential hypertension Stable on her current regimen.  Diabetes mellitus type 2, controlled ([place]) A1C checked today = 6.8 Still below goal. No medication adjustment at this time. F/U in 3-4 months for  repeat A1C. Monitor CBG closely at home.      Andrena Mews, MD [place]

## 2022-10-29 NOTE — Patient Instructions (Signed)
Tinnitus ?Tinnitus refers to hearing a sound when there is no actual source for that sound. This is often described as ringing in the ears. However, people with this condition may hear a variety of noises, in one ear or in both ears. ?The sounds of tinnitus can be soft, loud, or somewhere in between. Tinnitus can last for a few seconds or can be constant for days. It may go away without treatment and come back at various times. When tinnitus is constant or happens often, it can lead to other problems, such as trouble sleeping and trouble concentrating. ?Almost everyone experiences tinnitus at some point. Tinnitus is not the same as hearing loss. Tinnitus that is long-lasting (chronic) or comes back often (recurs) may require medical attention. ?What are the causes? ?The cause of tinnitus is often not known. In some cases, it can result from: ?Exposure to loud noises from machinery, music, or other sources. ?An object (foreign body) stuck in the ear. ?Earwax buildup. ?Drinking alcohol or caffeine. ?Taking certain medicines. ?Age-related hearing loss. ?It may also be caused by medical conditions such as: ?Ear or sinus infections. ?Heart diseases or high blood pressure. ?Allergies. ?M?ni?re's disease. ?Thyroid problems. ?Tumors. ?A weak, bulging blood vessel (aneurysm) near the ear. ?What increases the risk? ?The following factors may make you more likely to develop this condition: ?Exposure to loud noises. ?Age. Tinnitus is more likely in older individuals. ?Using alcohol or tobacco. ?What are the signs or symptoms? ?The main symptom of tinnitus is hearing a sound when there is no source for that sound. It may sound like: ?Buzzing. ?Sizzling. ?Ringing. ?Blowing air. ?Hissing. ?Whistling. ?Other sounds may include: ?Roaring. ?Running water. ?A musical note. ?Tapping. ?Humming. ?Symptoms may affect only one ear (unilateral) or both ears (bilateral). ?How is this diagnosed? ?Tinnitus is diagnosed based on your symptoms,  your medical history, and a physical exam. Your health care provider may do a thorough hearing test (audiologic exam) if your tinnitus: ?Is unilateral. ?Causes hearing difficulties. ?Lasts 6 months or longer. ?You may work with a health care provider who specializes in hearing disorders (audiologist). You may be asked questions about your symptoms and how they affect your daily life. You may have other tests done, such as: ?CT scan. ?MRI. ?An imaging test of how blood flows through your blood vessels (angiogram). ?How is this treated? ?Treating an underlying medical condition can sometimes make tinnitus go away. If your tinnitus continues, other treatments may include: ?Therapy and counseling to help you manage the stress of living with tinnitus. ?Sound generators to mask the tinnitus. These include: ?Tabletop sound machines that play relaxing sounds to help you fall asleep. ?Wearable devices that fit in your ear and play sounds or music. ?Acoustic neural stimulation. This involves using headphones to listen to music that contains an auditory signal. Over time, listening to this signal may change some pathways in your brain and make you less sensitive to tinnitus. This treatment is used for very severe cases when no other treatment is working. ?Using hearing aids or cochlear implants if your tinnitus is related to hearing loss. Hearing aids are worn in the outer ear. Cochlear implants are surgically placed in the inner ear. ?Follow these instructions at home: ?Managing symptoms ? ?  ? ?When possible, avoid being in loud places and being exposed to loud sounds. ?Wear hearing protection, such as earplugs, when you are exposed to loud noises. ?Use a white noise machine, a humidifier, or other devices to mask the sound of tinnitus. ?  Practice techniques for reducing stress, such as meditation, yoga, or deep breathing. Work with your health care provider if you need help with managing stress. ?Sleep with your head  slightly raised. This may reduce the impact of tinnitus. ?General instructions ?Do not use stimulants, such as nicotine, alcohol, or caffeine. Talk with your health care provider about other stimulants to avoid. Stimulants are substances that can make you feel alert and attentive by increasing certain activities in the body (such as heart rate and blood pressure). These substances may make tinnitus worse. ?Take over-the-counter and prescription medicines only as told by your health care provider. ?Try to get plenty of sleep each night. ?Keep all follow-up visits. This is important. ?Contact a health care provider if: ?Your tinnitus continues for 3 weeks or longer without stopping. ?You develop sudden hearing loss. ?Your symptoms get worse or do not get better with home care. ?You feel you are not able to manage the stress of living with tinnitus. ?Get help right away if: ?You develop tinnitus after a head injury. ?You have tinnitus along with any of the following: ?Dizziness. ?Nausea and vomiting. ?Loss of balance. ?Sudden, severe headache. ?Vision changes. ?Facial weakness or weakness of arms or legs. ?These symptoms may represent a serious problem that is an emergency. Do not wait to see if the symptoms will go away. Get medical help right away. Call your local emergency services (911 in the [place] Do not drive yourself to the hospital. ?Summary ?Tinnitus refers to hearing a sound when there is no actual source for that sound. This is often described as ringing in the ears. ?Symptoms may affect only one ear (unilateral) or both ears (bilateral). ?Use a white noise machine, a humidifier, or other devices to mask the sound of tinnitus. ?Do not use stimulants, such as nicotine, alcohol, or caffeine. These substances may make tinnitus worse. ?This information is not intended to replace advice given to you by your health care provider. Make sure you discuss any questions you have with your health care  provider. ?Document Revised: 11/13/2020 Document Reviewed: 11/13/2020 ?Elsevier Patient Education ? 2023 Elsevier Inc. ? ?

## 2022-11-12 ENCOUNTER — Ambulatory Visit (INDEPENDENT_AMBULATORY_CARE_PROVIDER_SITE_OTHER): Payer: Medicare PPO | Admitting: Family Medicine

## 2022-11-12 ENCOUNTER — Encounter: Payer: Self-pay | Admitting: Family Medicine

## 2022-11-12 VITALS — BP 118/55 | HR 77 | Ht 62.0 in | Wt 131.6 lb

## 2022-11-12 DIAGNOSIS — Z0000 Encounter for general adult medical examination without abnormal findings: Secondary | ICD-10-CM | POA: Diagnosis not present

## 2022-11-12 DIAGNOSIS — N1832 Chronic kidney disease, stage 3b: Secondary | ICD-10-CM

## 2022-11-12 DIAGNOSIS — I700 Atherosclerosis of aorta: Secondary | ICD-10-CM

## 2022-11-12 DIAGNOSIS — E7800 Pure hypercholesterolemia, unspecified: Secondary | ICD-10-CM | POA: Diagnosis not present

## 2022-11-12 NOTE — Assessment & Plan Note (Signed)
Stable

## 2022-11-12 NOTE — Assessment & Plan Note (Signed)
Stable. Continue ASA and Statins.

## 2022-11-12 NOTE — Progress Notes (Addendum)
Subjective:    [name] is a 81 y.o. female who presents for Medicare Annual/Subsequent preventive examination.  Ringing in the ears persists. No Hearing loss or pain..  She also c/o left shoulder pain after flu shot a few weeks ago. She denies swelling or redness. Pain goes down her arm. Pain has now improved about.   Preventive Screening-Counseling & Management  Tobacco Social History   Tobacco Use  Smoking Status Former   Types: Cigarettes   Quit date: 12/23/1980   Years since quitting: 41.9  Smokeless Tobacco Former   Types: Chew   Quit date: 08/23/1994     Problems Prior to Visit 1.   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 08/29/2017   Nodule of right lung 08/29/2017   Aortic arch atherosclerosis ([place]) 08/29/2017   Liver cyst    Left thyroid nodule 07/27/2014   History of thyroid cancer 04/12/2013   Diabetes mellitus type 2, controlled ([place]) 02/19/2007   HYPERCHOLESTEROLEMIA 02/19/2007   Anemia of chronic disease 02/19/2007   Essential hypertension 02/19/2007   Coronary artery disease involving native coronary artery of native heart without angina pectoris 02/19/2007    Medications Prior to Visit Current Outpatient Medications on File Prior to Visit  Medication Sig Dispense Refill   aspirin 81 MG chewable tablet Chew 81 mg by mouth daily.     atorvastatin (LIPITOR) 20 MG tablet TAKE ONE  TABLET BY MOUTH DAILY AT 6PM 90 tablet 2   carvedilol (COREG) 25 MG tablet TAKE 1 TABLET BY MOUTH TWICE A DAY WITH MEALS 180 tablet 1   ferrous sulfate 325 (65 FE) MG tablet Take 325 mg by mouth daily with breakfast.     insulin glargine (LANTUS) 100 UNIT/ML injection Inject 0.05 mLs (5 Units total) into the skin daily. 10 mL 4   isosorbide mononitrate (IMDUR) 60 MG 24 hr tablet Take 1.5 tablets (90 mg total) by mouth daily. 135 tablet 1   JARDIANCE 25 MG TABS tablet TAKE ONE TABLET BY MOUTH DAILY 90 tablet 1   lisinopril-hydrochlorothiazide (ZESTORETIC) 20-12.5 MG tablet Take 1 tablet by mouth daily. 90 tablet 1   metFORMIN (GLUCOPHAGE-XR) 500 MG 24 hr tablet Take 1 tablet (500 mg total) by mouth 2 (two) times daily. 180 tablet 1   Multiple Vitamin (MULTIVITAMIN WITH MINERALS) TABS tablet Take 1 tablet by mouth daily. Reported on 04/02/2016     Accu-Chek FastClix Lancets MISC USE ONE LANCET TO TEST THREE TIMES A DAY 102 each 4   ACCU-CHEK GUIDE test strip USE ONE STRIP TO TEST THREE TIMES A DAY 100 strip 5   Blood Glucose Monitoring Suppl (ACCU-CHEK GUIDE) w/Device KIT USE AS DIRECTED IN THE MORNING, AT NOON AND AT BEDTIME Strength: w/Device 1 kit 0   fluticasone (FLONASE) 50 MCG/ACT nasal spray Place into the nose. (Patient not taking: Reported on 10/29/2022)     Insulin Syringe-Needle U-100 (RELION INSULIN SYRINGE) 31G X 15/64"  1 ML MISC USE  AS DIRECTED TID FOR CBG CHECK 100 each 5   No current facility-administered medications on file prior to visit.    Current Medications (verified) Current Outpatient Medications  Medication Sig Dispense Refill   aspirin 81 MG chewable tablet Chew 81 mg by mouth daily.     atorvastatin (LIPITOR) 20 MG tablet TAKE ONE TABLET BY MOUTH DAILY AT 6PM 90 tablet 2   carvedilol (COREG) 25 MG tablet TAKE 1 TABLET BY MOUTH TWICE A DAY WITH MEALS 180 tablet 1   ferrous sulfate 325 (65 FE) MG tablet Take 325 mg by mouth daily with breakfast.     insulin glargine  (LANTUS) 100 UNIT/ML injection Inject 0.05 mLs (5 Units total) into the skin daily. 10 mL 4   isosorbide mononitrate (IMDUR) 60 MG 24 hr tablet Take 1.5 tablets (90 mg total) by mouth daily. 135 tablet 1   JARDIANCE 25 MG TABS tablet TAKE ONE TABLET BY MOUTH DAILY 90 tablet 1   lisinopril-hydrochlorothiazide (ZESTORETIC) 20-12.5 MG tablet Take 1 tablet by mouth daily. 90 tablet 1   metFORMIN (GLUCOPHAGE-XR) 500 MG 24 hr tablet Take 1 tablet (500 mg total) by mouth 2 (two) times daily. 180 tablet 1   Multiple Vitamin (MULTIVITAMIN WITH MINERALS) TABS tablet Take 1 tablet by mouth daily. Reported on 04/02/2016     Accu-Chek FastClix Lancets MISC USE ONE LANCET TO TEST THREE TIMES A DAY 102 each 4   ACCU-CHEK GUIDE test strip USE ONE STRIP TO TEST THREE TIMES A DAY 100 strip 5   Blood Glucose Monitoring Suppl (ACCU-CHEK GUIDE) w/Device KIT USE AS DIRECTED IN THE MORNING, AT NOON AND AT BEDTIME Strength: w/Device 1 kit 0   fluticasone (FLONASE) 50 MCG/ACT nasal spray Place into the nose. (Patient not taking: Reported on 10/29/2022)     Insulin Syringe-Needle U-100 (RELION INSULIN SYRINGE) 31G X 15/64" 1 ML MISC USE  AS DIRECTED TID FOR CBG CHECK 100 each 5   No current facility-administered medications for this visit.     Allergies (verified) Codeine, Sulfamethoxazole-trimethoprim, and Tramadol   PAST HISTORY  Family History Family History  Problem Relation Age of Onset   Diabetes Mother        Deceased- hypoglycemic coma    Hypertension Mother    Other Father        unaware of his health history - died when pt was 81 yr old.   Heart attack Sister    Diabetes Mellitus II Brother    Heart attack Brother    Heart attack Brother    Diabetes Mellitus II Brother    Epilepsy Brother    Breast cancer Daughter 59   Cancer Granddaughter     Social History Social History   Tobacco Use   Smoking status: Former    Types: Cigarettes    Quit date: 12/23/1980    Years since quitting: 41.9    Smokeless tobacco: Former    Types: Chew    Quit date: 08/23/1994  Substance Use Topics   Alcohol use: No     Are there smokers in your home (other than you)? No   Goals      Eat Healthy     Timeframe:  Long-Range Goal Priority:  High Start Date:        11/12/22                     Expected End Date:  Follow Up Date: Ongoing   - manage portion size - set a realistic goal - She wants to be healthy and live long    Why is this important?   When you are ready to manage your nutrition or weight, having a plan and setting goals will help.  Taking small steps to change how you eat and exercise is a good place to start.    Notes:         Risk Factors Current exercise habits: The patient has a physically strenuous job, but has no regular exercise apart from work.   Dietary issues discussed: No concerns   Cardiac risk factors: advanced age (older than 53 for men, 64 for women), diabetes mellitus, dyslipidemia, and hypertension.  Depression Screen (Note: if answer to either of the following is "Yes", a more complete depression screening is indicated)   Over the past two weeks, have you felt down, depressed or hopeless? No  Over the past two weeks, have you felt little interest or pleasure in doing things? No  Have you lost interest or pleasure in daily life? No  Do you often feel hopeless? No  Do you cry easily over simple problems? No  Activities of Daily Living In your present state of health, do you have any difficulty performing the following activities?:  Driving? No She does not drive Managing money?  No Feeding yourself? No Getting from bed to chair? No Climbing a flight of stairs? No Preparing food and eating?: No Bathing or showering? No Getting dressed: No Getting to the toilet? No Using the toilet:No Moving around from place to place: No In the past year have you fallen or had a near fall?:No   Are you sexually active?  No  Do you  have more than one partner?  No  Hearing Difficulties: No Do you often ask people to speak up or repeat themselves? No Do you experience ringing or noises in your ears? No Do you have difficulty understanding soft or whispered voices? No   Do you feel that you have a problem with memory? No  Do you often misplace items? Yes. Sometimes loses her house key - she forgets where she kept them  Do you feel safe at home?  Yes  Cognitive Testing  Alert? Yes  Normal Appearance?Yes  Oriented to person? Yes  Place? Yes   Time? Yes  Recall of three objects?  Yes  Can perform simple calculations? Yes  Displays appropriate judgment?Yes  Can read the correct time from a watch face?Yes   Advanced Directives have been discussed with the patient? Yes   Mini-Cog - 11/12/22 1103     Normal clock drawing test? no    How many words correct? 3              List the Names of Other Physician/Practitioners you currently use: 1.    Indicate any recent Medical Services you may have received from other than [facility] providers in the past year (date may be approximate).  Immunization History  Administered Date(s) Administered   Fluad Quad(high Dose 65+) 10/20/2020, 11/02/2021, 10/29/2022   Influenza Split 09/15/2012   Influenza Whole 10/11/2008, 09/28/2009, 10/22/2010, 09/04/2011   Influenza,inj,Quad PF,6+ Mos 10/15/2013, 10/14/2014, 11/28/2015, 11/05/2016, 09/30/2017, 09/29/2018, 10/19/2019   Pneumococcal Conjugate-13 11/28/2015   Pneumococcal Polysaccharide-23 06/22/1994, 11/26/2011   Td 06/27/2005   Tdap 11/07/2016    Screening Tests Health Maintenance  Topic Date Due   Medicare Annual Wellness (AWV)  04/02/2017   COVID-19 Vaccine (  1) 12/23/2022 (Originally 06/02/1942)   Zoster Vaccines- Shingrix (1 of 2) 09/12/2023 (Originally 12/01/1992)   HEMOGLOBIN A1C  01/29/2023   OPHTHALMOLOGY EXAM  05/09/2023   Diabetic kidney evaluation - Urine ACR  06/19/2023   Diabetic kidney evaluation - GFR  measurement  08/07/2023   FOOT EXAM  11/13/2023   Pneumonia Vaccine 24+ Years old  Completed   INFLUENZA VACCINE  Completed   DEXA SCAN  Completed   Hepatitis C Screening  Completed   HPV VACCINES  Aged Out    All answers were reviewed with the patient and necessary referrals were made:  Andrena Mews, MD   11/12/2022   History reviewed: allergies, current medications, past family history, past medical history, past social history, past surgical history, and problem list  Review of Systems Pertinent items are noted in HPI.    Objective:     Vision by Snellen chart: right eye:20/20, left eye:20/40, she did not wear her eyeglasses  Body mass index is 24.07 kg/m. BP (!) 118/55   Pulse 77   Ht _0  (1.575 m)   Wt 131 lb 9.6 oz (59.7 kg)   LMP 11/24/1992   SpO2 100%   BMI 24.07 kg/m   BP (!) 118/55   Pulse 77   Ht _1  (1.575 m)   Wt 131 lb 9.6 oz (59.7 kg)   LMP 11/24/1992   SpO2 100%   BMI 24.07 kg/m  General appearance: alert and cooperative Head: Normocephalic, without obvious abnormality, atraumatic Eyes: conjunctivae/corneas clear. PERRL, EOM's intact. Fundi benign. Ears: normal TM's and external ear canals both ears Throat: lips, mucosa, and tongue normal; teeth and gums normal Lungs: clear to auscultation bilaterally Heart: regular rate and rhythm, S1, S2 normal, no murmur, click, rub or gallop Abdomen: soft, non-tender; bowel sounds normal; no masses,  no organomegaly and . Extremities: extremities normal, atraumatic, no cyanosis or edema and . Sensory exam of the foot is normal, tested with the monofilament. Good pulses, no lesions or ulcers, good peripheral pulses. Skin: Skin color, texture, turgor normal. No rashes or lesions Neurologic: Alert and oriented X 3, normal strength and tone. Normal symmetric reflexes. Normal coordination and gait     Assessment:     AWV Left arm pain Tinnitus      Plan:     During the course of the visit the  patient was educated and counseled about appropriate screening and preventive services including:   Pneumococcal vaccine  Influenza vaccine Hepatitis B vaccine Td vaccine Screening mammography Screening Pap smear and pelvic exam  Bone densitometry screening Colorectal cancer screening Diabetes screening Nutrition counseling  Advanced directives: has no advanced directive. Paper work and information provided  She is up to date with vaccinations and cancer screenings.  Diet review for nutrition referral? Yes. Referral is not Indicated    Patient Instructions (the written plan) was given to the patient.  Medicare Attestation I have personally reviewed: The patient's medical and social history Their use of alcohol, tobacco or illicit drugs Their current medications and supplements The patient's functional ability including ADLs,fall risks, home safety risks, cognitive, and hearing and visual impairment Diet and physical activities Evidence for depression or mood disorders  The patient's weight, height, BMI, and visual acuity have been recorded in the chart.  I have made referrals, counseling, and provided education to the patient based on review of the above and I have provided the patient with a written personalized care plan for preventive services.    Chronic  problems reviewed and addressed: HTN, HLD, DM2, CKD, Atherosclerosis. Return in 4 weeks for FLP.  Left arm looks benign. Likely traumatic injury from vaccine injection. Monitor conservatively for now. She agreed with the plan.  Tinnitus - referral to ENT due to its persistence.    Andrena Mews, MD   11/12/2022

## 2022-11-12 NOTE — Patient Instructions (Signed)
Preventive Care 65 Years and Older, Female Preventive care refers to lifestyle choices and visits with your health care provider that can promote health and wellness. Preventive care visits are also called wellness exams. What can I expect for my preventive care visit? Counseling Your health care provider may ask you questions about your: Medical history, including: Past medical problems. Family medical history. Pregnancy and menstrual history. History of falls. Current health, including: Memory and ability to understand (cognition). Emotional well-being. Home life and relationship well-being. Sexual activity and sexual health. Lifestyle, including: Alcohol, nicotine or tobacco, and drug use. Access to firearms. Diet, exercise, and sleep habits. Work and work environment. Sunscreen use. Safety issues such as seatbelt and bike helmet use. Physical exam Your health care provider will check your: Height and weight. These may be used to calculate your BMI (body mass index). BMI is a measurement that tells if you are at a healthy weight. Waist circumference. This measures the distance around your waistline. This measurement also tells if you are at a healthy weight and may help predict your risk of certain diseases, such as type 2 diabetes and high blood pressure. Heart rate and blood pressure. Body temperature. Skin for abnormal spots. What immunizations do I need?  Vaccines are usually given at various ages, according to a schedule. Your health care provider will recommend vaccines for you based on your age, medical history, and lifestyle or other factors, such as travel or where you work. What tests do I need? Screening Your health care provider may recommend screening tests for certain conditions. This may include: Lipid and cholesterol levels. Hepatitis C test. Hepatitis B test. HIV (human immunodeficiency virus) test. STI (sexually transmitted infection) testing, if you are at  risk. Lung cancer screening. Colorectal cancer screening. Diabetes screening. This is done by checking your blood sugar (glucose) after you have not eaten for a while (fasting). Mammogram. Talk with your health care provider about how often you should have regular mammograms. BRCA-related cancer screening. This may be done if you have a family history of breast, ovarian, tubal, or peritoneal cancers. Bone density scan. This is done to screen for osteoporosis. Talk with your health care provider about your test results, treatment options, and if necessary, the need for more tests. Follow these instructions at home: Eating and drinking  Eat a diet that includes fresh fruits and vegetables, whole grains, lean protein, and low-fat dairy products. Limit your intake of foods with high amounts of sugar, saturated fats, and salt. Take vitamin and mineral supplements as recommended by your health care provider. Do not drink alcohol if your health care provider tells you not to drink. If you drink alcohol: Limit how much you have to 0-1 drink a day. Know how much alcohol is in your drink. In the [place] one drink equals one 12 oz bottle of beer (355 mL), one 5 oz glass of wine (148 mL), or one 1 oz glass of hard liquor (44 mL). Lifestyle Brush your teeth every morning and night with fluoride toothpaste. Floss one time each day. Exercise for at least 30 minutes 5 or more days each week. Do not use any products that contain nicotine or tobacco. These products include cigarettes, chewing tobacco, and vaping devices, such as e-cigarettes. If you need help quitting, ask your health care provider. Do not use drugs. If you are sexually active, practice safe sex. Use a condom or other form of protection in order to prevent STIs. Take aspirin only as told by   your health care provider. Make sure that you understand how much to take and what form to take. Work with your health care provider to find out whether it  is safe and beneficial for you to take aspirin daily. Ask your health care provider if you need to take a cholesterol-lowering medicine (statin). Find healthy ways to manage stress, such as: Meditation, yoga, or listening to music. Journaling. Talking to a trusted person. Spending time with friends and family. Minimize exposure to UV radiation to reduce your risk of skin cancer. Safety Always wear your seat belt while driving or riding in a vehicle. Do not drive: If you have been drinking alcohol. Do not ride with someone who has been drinking. When you are tired or distracted. While texting. If you have been using any mind-altering substances or drugs. Wear a helmet and other protective equipment during sports activities. If you have firearms in your house, make sure you follow all gun safety procedures. What's next? Visit your health care provider once a year for an annual wellness visit. Ask your health care provider how often you should have your eyes and teeth checked. Stay up to date on all vaccines. This information is not intended to replace advice given to you by your health care provider. Make sure you discuss any questions you have with your health care provider. Document Revised: 06/06/2021 Document Reviewed: 06/06/2021 Elsevier Patient Education  2023 Elsevier Inc.  

## 2022-11-12 NOTE — Assessment & Plan Note (Signed)
Return in 4 weeks for fasting lab

## 2022-11-20 ENCOUNTER — Telehealth: Payer: Self-pay | Admitting: *Deleted

## 2022-11-20 NOTE — Progress Notes (Signed)
  Care Coordination Note  11/20/2022 Name: [name] MRN: [id number] DOB: April 02, 1941  [name] is a 81 y.o. year old female who is a primary care patient of [name], MD and is actively engaged with the care management team. I reached out to [name] by phone today to assist with re-scheduling a follow up visit with the RN Case Manager  Follow up plan: Unsuccessful telephone outreach attempt made.   [place]  Direct Dial: [phone]

## 2022-11-27 NOTE — Progress Notes (Signed)
  Care Coordination Note  11/27/2022 Name: [name] MRN: [id number] DOB: Dec 23, 1941  [name] is a 81 y.o. year old female who is a primary care patient of [name], MD and is actively engaged with the care management team. I reached out to [name] by phone today to assist with re-scheduling a follow up visit with the RN Case Manager  Follow up plan: Unsuccessful telephone outreach attempt made. A HIPAA compliant phone message was left for the patient providing contact information and requesting a return call. [name]  Direct Dial: [phone]

## 2022-11-29 NOTE — Progress Notes (Signed)
  Care Coordination Note  11/29/2022 Name: [name] MRN: [id number] DOB: 1941-11-26  [name] is a 81 y.o. year old female who is a primary care patient of [name], MD and is actively engaged with the care management team. I reached out to [name] by phone today to assist with re-scheduling a follow up visit with the RN Case Manager  Follow up plan: Telephone appointment with care management team member scheduled for:12/05/22  [place]: [phone]

## 2022-12-04 ENCOUNTER — Other Ambulatory Visit: Payer: Self-pay | Admitting: Family Medicine

## 2022-12-05 ENCOUNTER — Ambulatory Visit: Payer: Self-pay

## 2022-12-05 ENCOUNTER — Telehealth: Payer: Self-pay

## 2022-12-05 NOTE — Patient Instructions (Signed)
Visit Information  Thank you for taking time to visit with me today. Please don't hesitate to contact me if I can be of assistance to you.   Following are the goals we discussed today:   Goals Addressed             This Visit's Progress    I continue to monitor myblood sugars and bloodpressure.        Reviewed medications with patient and discussed importance of medication adherence Counseled on importance of regular laboratory monitoring as prescribed Discussed plans with patient for ongoing care management follow up and provided patient with direct contact information for care management team Advised patient, providing education and rationale, to check cbg  and record, calling office for findings outside established parameters  Evaluation of current treatment plan related to hypertension self management and patient's adherence to plan as established by provider Advised patient, providing education and rationale, to monitor blood pressure daily and record, calling PCP for findings outside established parameters Discussed complications of poorly controlled blood pressure such as heart disease, stroke, circulatory complications, vision complications, kidney impairment, sexual dysfunction         Our next appointment is by telephone on 01/15/23 at 10 am  Please call the care guide team at [phone] if you need to cancel or reschedule your appointment.   If you are experiencing a Mental Health or [place] or need someone to talk to, please call 1-800-273-TALK (toll free, 24 hour hotline)  Patient verbalizes understanding of instructions and care plan provided today and agrees to view in [place]. Active MyChart status and patient understanding of how to access instructions and care plan via MyChart confirmed with patient.     Lazaro Arms RN, BSN, [facility]   Phone: [phone]

## 2022-12-05 NOTE — Patient Outreach (Signed)
  Care Coordination   Follow Up Visit Note   12/05/2022 Name: [name] MRN: [id number] DOB: Sep 25, 1941  [name] is a 81 y.o. year old female who sees [name], MD for primary care. I spoke with  [name] by phone today.  What matters to the patients health and wellness today?  [name]'s current health status is positive. Her blood sugar levels remain stable at approximately 125, while her blood pressure ranges from 135/68 to 139/60. It is noteworthy that [name] is consistently taking her prescribed medications and adhering to a well-balanced diet.    Goals Addressed             This Visit's Progress    I continue to monitor myblood sugars and bloodpressure.        Reviewed medications with patient and discussed importance of medication adherence Counseled on importance of regular laboratory monitoring as prescribed Discussed plans with patient for ongoing care management follow up and provided patient with direct contact information for care management team Advised patient, providing education and rationale, to check cbg  and record, calling office for findings outside established parameters  Evaluation of current treatment plan related to hypertension self management and patient's adherence to plan as established by provider Advised patient, providing education and rationale, to monitor blood pressure daily and record, calling PCP for findings outside established parameters Discussed complications of poorly controlled blood pressure such as heart disease, stroke, circulatory complications, vision complications, kidney impairment, sexual dysfunction         SDOH assessments and interventions completed:  No     Care Coordination Interventions:  Yes, provided   Follow up plan: Follow up call scheduled for 01/15/23 10 am    Encounter Outcome:  Pt. Visit Completed   Lazaro Arms RN, BSN, [facility]   Phone: [phone]

## 2022-12-05 NOTE — Telephone Encounter (Signed)
Patient calls nurse line requesting PCP opinion on eye drops.   She reports itchy watery eyes for the past several days. She reports every morning she wakes up and her eyes are "crusted over."   She denies any fevers or vision problems.   She would like to try an OTC eye drop before scheduling an apt.  Will forward to PCP.

## 2022-12-05 NOTE — Telephone Encounter (Signed)
Attempted to call patient. Unable to leave VM.   Please let her know that she can trial OTC drops for 2-3 days but should be seen if not improved. Recommend Lacrilube or similar brand.  Dorris Singh, MD  Family Medicine Teaching Service

## 2022-12-06 NOTE — Telephone Encounter (Signed)
Attempted to reach patient. No answer. LVM of note and for patient to call the office if not improved. Salvatore Marvel, CMA

## 2022-12-29 ENCOUNTER — Telehealth: Payer: Self-pay | Admitting: Family Medicine

## 2022-12-29 NOTE — Telephone Encounter (Signed)
Patient calls after hours emergency line endorsing pain in top of her stomach under her breast. Tried ginger ale without relief. States the pain is non radiating. 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 Had an episode of diarrhea this morning. No fever. Does have some mild stomach pain. No emesis. Does have appointment scheduled for Tuesday at the clinic. Discussed evaluation at urgent care or ED today if pain does not improve or worsens this morning. Also advised to stay well-hydrated in the event this is gastroenteritis given the stomach pain and diarrhea. All questions and concerns addressed.

## 2022-12-31 ENCOUNTER — Ambulatory Visit: Payer: Medicare PPO | Admitting: Family Medicine

## 2022-12-31 ENCOUNTER — Encounter: Payer: Self-pay | Admitting: Family Medicine

## 2022-12-31 VITALS — BP 148/51 | HR 71 | Ht 62.0 in | Wt 137.2 lb

## 2022-12-31 DIAGNOSIS — E1169 Type 2 diabetes mellitus with other specified complication: Secondary | ICD-10-CM | POA: Diagnosis not present

## 2022-12-31 DIAGNOSIS — I10 Essential (primary) hypertension: Secondary | ICD-10-CM | POA: Diagnosis not present

## 2022-12-31 DIAGNOSIS — H9313 Tinnitus, bilateral: Secondary | ICD-10-CM | POA: Diagnosis not present

## 2022-12-31 DIAGNOSIS — E785 Hyperlipidemia, unspecified: Secondary | ICD-10-CM | POA: Diagnosis not present

## 2022-12-31 DIAGNOSIS — R1013 Epigastric pain: Secondary | ICD-10-CM | POA: Diagnosis not present

## 2022-12-31 DIAGNOSIS — E7800 Pure hypercholesterolemia, unspecified: Secondary | ICD-10-CM

## 2022-12-31 NOTE — Patient Instructions (Signed)
Plantar Fasciitis  Plantar fasciitis is a painful foot condition that affects the heel. It occurs when the band of tissue that connects the toes to the heel bone (plantar fascia) becomes irritated. This can happen as the result of exercising too much or doing other repetitive activities (overuse injury). Plantar fasciitis can cause mild irritation to severe pain that makes it difficult to walk or move. The pain is usually worse in the morning after sleeping, or after sitting or lying down for a period of time. Pain may also be worse after long periods of walking or standing. What are the causes? This condition may be caused by: Standing for long periods of time. Wearing shoes that do not have good arch support. Doing activities that put stress on joints (high-impact activities). This includes ballet and exercise that makes your heart beat faster (aerobic exercise), such as running. Being overweight. An abnormal way of walking (gait). Tight muscles in the back of your lower leg (calf). High arches in your feet or flat feet. Starting a new athletic activity. What are the signs or symptoms? The main symptom of this condition is heel pain. Pain may get worse after the following: Taking the first steps after a time of rest, especially in the morning after awakening, or after you have been sitting or lying down for a while. Long periods of standing still. Pain may decrease after 30-45 minutes of activity, such as gentle walking. How is this diagnosed? This condition may be diagnosed based on your medical history, a physical exam, and your symptoms. Your health care provider will check for: A tender area on the bottom of your foot. A high arch in your foot or flat feet. Pain when you move your foot. Difficulty moving your foot. You may have imaging tests to confirm the diagnosis, such as: X-rays. Ultrasound. MRI. How is this treated? Treatment for plantar fasciitis depends on how severe your  condition is. Treatment may include: Rest, ice, pressure (compression), and raising (elevating) the affected foot. This is called RICE therapy. Your health care provider may recommend RICE therapy along with over-the-counter pain medicines to manage your pain. Exercises to stretch your calves and your plantar fascia. A splint that holds your foot in a stretched, upward position while you sleep (night splint). Physical therapy to relieve symptoms and prevent problems in the future. Injections of steroid medicine (cortisone) to relieve pain and inflammation. Stimulating your plantar fascia with electrical impulses (extracorporeal shock wave therapy). This is usually the last treatment option before surgery. Surgery, if other treatments have not worked after 12 months. Follow these instructions at home: Managing pain, stiffness, and swelling  If directed, put ice on the painful area. To do this: Put ice in a plastic bag, or use a frozen bottle of water. Place a towel between your skin and the bag or bottle. Roll the bottom of your foot over the bag or bottle. Do this for 20 minutes, 2-3 times a day. Wear athletic shoes that have air-sole or gel-sole cushions, or try soft shoe inserts that are designed for plantar fasciitis. Elevate your foot above the level of your heart while you are sitting or lying down. Activity Avoid activities that cause pain. Ask your health care provider what activities are safe for you. Do physical therapy exercises and stretches as told by your health care provider. Try activities and forms of exercise that are easier on your joints (low impact). Examples include swimming, water aerobics, and biking. General instructions Take over-the-counter   and prescription medicines only as told by your health care provider. Wear a night splint while sleeping, if told by your health care provider. Loosen the splint if your toes tingle, become numb, or turn cold and blue. Maintain a  healthy weight, or work with your health care provider to lose weight as needed. Keep all follow-up visits. This is important. Contact a health care provider if you have: Symptoms that do not go away with home treatment. Pain that gets worse. Pain that affects your ability to move or do daily activities. Summary Plantar fasciitis is a painful foot condition that affects the heel. It occurs when the band of tissue that connects the toes to the heel bone (plantar fascia) becomes irritated. Heel pain is the main symptom of this condition. It may get worse after exercising too much or standing still for a long time. Treatment varies, but it usually starts with rest, ice, pressure (compression), and raising (elevating) the affected foot. This is called RICE therapy. Over-the-counter medicines can also be used to manage pain. This information is not intended to replace advice given to you by your health care provider. Make sure you discuss any questions you have with your health care provider. Document Revised: 03/27/2020 Document Reviewed: 03/27/2020 Elsevier Patient Education  2023 Elsevier Inc.  

## 2022-12-31 NOTE — Assessment & Plan Note (Addendum)
FLP checked today.  Continue Lipitor 20 mg QD.

## 2022-12-31 NOTE — Progress Notes (Signed)
    SUBJECTIVE:   CHIEF COMPLAINT / HPI:   Abdominal Pain This is a new problem. The current episode started in the past 7 days (Stomach pain started 3 days ago). The onset quality is gradual. The problem occurs intermittently. The problem has been gradually improving. The pain is located in the epigastric region. The pain is at a severity of 0/10 (Three days ago, her pain was 5/10 in severity which improved ater bowel movement.). The abdominal pain does not radiate. Associated symptoms include diarrhea. Pertinent negatives include no anorexia, constipation, fever, nausea or vomiting. Nothing aggravates the pain. The pain is relieved by Bowel movements. She has tried nothing for the symptoms.  Hyperlipidemia This is a chronic (She is compliant with her Statin and is here for FLP test.) problem.  HTN: Compliant with meds.  No new concerns. Right heel/left shoulder pain: C/O right heel pain ongoing for a few weeks. This is intermittent and sometimes feels like a shooting pain. Her pain is aggravated by movement or weight bearing.  She is currently asymptomatic. She denies any trauma to the area. She also c/o left shoulder intermitting shooting pain since she received COVID-19 vaccination months ago. Pain is aggravated by certain movement. She denies redness or swelling. She is currently asymptomatic.   Tinnitus: She c/o this during her last visits. Symptoms persist. She denies pain or hearing loss.   PERTINENT  PMH / PSH: PMHx reviewed  OBJECTIVE:   BP (!) 148/51   Pulse 71   Ht '5\' 2"'$  (1.575 m)   Wt 137 lb 3.2 oz (62.2 kg)   LMP 11/24/1992   SpO2 100%   BMI 25.09 kg/m   Physical Exam Vitals and nursing note reviewed.  Constitutional:      Appearance: Normal appearance.  Cardiovascular:     Rate and Rhythm: Normal rate and regular rhythm.     Heart sounds: Normal heart sounds. No murmur heard. Pulmonary:     Effort: Pulmonary effort is normal. No respiratory distress.     Breath  sounds: Normal breath sounds. No rhonchi.  Abdominal:     General: Abdomen is flat. Bowel sounds are normal.     Palpations: Abdomen is soft. There is no mass.     Tenderness: There is no abdominal tenderness. There is no guarding or rebound.  Musculoskeletal:     Right shoulder: Normal.     Left shoulder: Normal.  Feet:     Comments: Thickened toenails, especially first digits. No heel or calf tenderness B/L Neurological:     Mental Status: She is alert.      ASSESSMENT/PLAN:  Abdominal pain: Likely gastroenteritis given associated diarrhea. ?? GERD. Symptoms resolved without treatment. She is up to date with colon cancer screen. Monitor for now. Return precautions discussed.  HYPERCHOLESTEROLEMIA FLP checked today.  Continue Lipitor 20 mg QD.  Essential hypertension BP initially elevated. She is compliant with her Coreg 25 mg BID, Amdur 90 mg QD, and Zestoretic 20/12.5 mg QD (previously BID). Repeat BP improved. Monitor for now on her current regimen. F/U in 4 weeks for reassessment.    Heel pain/thickened toenail. No acute findings. She will contact her podiatrist office for f/u. Plantar fasciitis home exercise information provided.  Tinnitus: Plan was to refer to ENT. Unfortunately, order was missed.  Referral placed.   Andrena Mews, MD [place]

## 2022-12-31 NOTE — Assessment & Plan Note (Signed)
BP initially elevated. She is compliant with her Coreg 25 mg BID, Amdur 90 mg QD, and Zestoretic 20/12.5 mg QD (previously BID). Repeat BP improved. Monitor for now on her current regimen. F/U in 4 weeks for reassessment.

## 2023-01-01 ENCOUNTER — Telehealth: Payer: Self-pay | Admitting: Family Medicine

## 2023-01-01 LAB — LIPID PANEL
Chol/HDL Ratio: 2.3 ratio (ref 0.0–4.4)
Cholesterol, Total: 125 mg/dL (ref 100–199)
HDL: 55 mg/dL (ref >39)
LDL Chol Calc (NIH): 55 mg/dL (ref 0–99)
Triglycerides: 75 mg/dL (ref 0–149)
VLDL Cholesterol Cal: 15 mg/dL (ref 5–40)

## 2023-01-01 NOTE — Telephone Encounter (Signed)
LDL at goal. Continue Lipitor 20 mg. She agreed with the plan.

## 2023-01-11 ENCOUNTER — Other Ambulatory Visit: Payer: Self-pay | Admitting: Cardiovascular Disease

## 2023-01-15 ENCOUNTER — Ambulatory Visit: Payer: Self-pay

## 2023-01-15 NOTE — Patient Outreach (Signed)
  Care Coordination   Follow Up Visit Note   01/15/2023 Name: [name] MRN: [id number] DOB: [birth year]  [name] is a 82 y.o. year old female who sees [name], MD for primary care. I spoke with  [name] by phone today.  What matters to the patients health and wellness today?  I feel good     Goals Addressed             This Visit's Progress    I continue to monitor my blood sugars and bloodpressure.        Reviewed medications with patient and discussed importance of medication adherence Counseled on importance of regular laboratory monitoring as prescribed Discussed plans with patient for ongoing care management follow up and provided patient with direct contact information for care management team Advised patient, providing education and rationale, to check cbg  and record, calling office for findings outside established parameters  Evaluation of current treatment plan related to hypertension self management and patient's adherence to plan as established by provider Advised patient, providing education and rationale, to monitor blood pressure daily and record, calling PCP for findings outside established parameters Discussed complications of poorly controlled blood pressure such as heart disease, stroke, circulatory complications, vision complications, kidney impairment, sexual dysfunction The patient states she is doing well. As far as her health is concerned, She has been keeping a close eye on her blood pressure and blood sugar levels. Over the past few days, her blood pressure has been ranging between 139/60 and 145/60, while her blood sugar levels have been recorded between 106 and 110. However,  she remains dedicated to maintaining a healthy lifestyle by strictly following my prescribed diet and taking my medication as directed.          SDOH assessments and interventions completed:  No     Care Coordination Interventions:  Yes,  provided   Follow up plan: Follow up call scheduled for 03/14/23 9 am    Encounter Outcome:  Pt. Visit Completed   Lazaro Arms RN, BSN, [facility]   Phone: [phone]

## 2023-01-15 NOTE — Patient Instructions (Signed)
Visit Information  Thank you for taking time to visit with me today. Please don't hesitate to contact me if I can be of assistance to you.   Following are the goals we discussed today:   Goals Addressed             This Visit's Progress    I continue to monitor my blood sugars and bloodpressure.        Reviewed medications with patient and discussed importance of medication adherence Counseled on importance of regular laboratory monitoring as prescribed Discussed plans with patient for ongoing care management follow up and provided patient with direct contact information for care management team Advised patient, providing education and rationale, to check cbg  and record, calling office for findings outside established parameters  Evaluation of current treatment plan related to hypertension self management and patient's adherence to plan as established by provider Advised patient, providing education and rationale, to monitor blood pressure daily and record, calling PCP for findings outside established parameters Discussed complications of poorly controlled blood pressure such as heart disease, stroke, circulatory complications, vision complications, kidney impairment, sexual dysfunction The patient states she is doing well. As far as her health is concerned, She has been keeping a close eye on her blood pressure and blood sugar levels. Over the past few days, her blood pressure has been ranging between 139/60 and 145/60, while her blood sugar levels have been recorded between 106 and 110. However,  she remains dedicated to maintaining a healthy lifestyle by strictly following my prescribed diet and taking my medication as directed.          Our next appointment is by telephone on 03/14/23 at 9 am  Please call the care guide team at [phone] if you need to cancel or reschedule your appointment.   If you are experiencing a Mental Health or [place] or need someone  to talk to, please call 1-800-273-TALK (toll free, 24 hour hotline)  Patient verbalizes understanding of instructions and care plan provided today and agrees to view in [place]. Active MyChart status and patient understanding of how to access instructions and care plan via MyChart confirmed with patient.     Lazaro Arms RN, BSN, [facility]   Phone: [phone]

## 2023-01-20 DIAGNOSIS — N189 Chronic kidney disease, unspecified: Secondary | ICD-10-CM | POA: Diagnosis not present

## 2023-01-20 DIAGNOSIS — N1831 Chronic kidney disease, stage 3a: Secondary | ICD-10-CM | POA: Diagnosis not present

## 2023-01-22 ENCOUNTER — Other Ambulatory Visit: Payer: Self-pay | Admitting: Family Medicine

## 2023-01-23 ENCOUNTER — Other Ambulatory Visit: Payer: Self-pay | Admitting: Family Medicine

## 2023-01-28 ENCOUNTER — Ambulatory Visit: Payer: Medicare PPO | Admitting: Family Medicine

## 2023-01-28 ENCOUNTER — Encounter: Payer: Self-pay | Admitting: Family Medicine

## 2023-01-28 VITALS — BP 147/52 | HR 74 | Ht 62.0 in | Wt 137.1 lb

## 2023-01-28 DIAGNOSIS — I10 Essential (primary) hypertension: Secondary | ICD-10-CM | POA: Diagnosis not present

## 2023-01-28 NOTE — Patient Instructions (Addendum)
Nice seeing you today. Your BP is close to your goal of < 150/90. We will not make medication adjustment at this time. Please continue home BP monitoring. I will see you in 4 weeks.

## 2023-01-28 NOTE — Progress Notes (Signed)
    SUBJECTIVE:   CHIEF COMPLAINT / HPI:   HTN: She is here for follow up. She is compliant with her BP meds as listed. Her home systolic BP is in the 729M. No new concerns.  PERTINENT  PMH / PSH: PMHX reviewed.  OBJECTIVE:   Vitals:   01/28/23 0838 01/28/23 0848  BP: (!) 155/58 (!) 147/52  Pulse: 74   SpO2: 100%   Weight: 137 lb 2 oz (62.2 kg)   Height: '5\' 2"'$  (1.575 m)     Physical Exam Vitals and nursing note reviewed.  Cardiovascular:     Rate and Rhythm: Normal rate and regular rhythm.     Heart sounds: Normal heart sounds. No murmur heard. Pulmonary:     Effort: Pulmonary effort is normal. No respiratory distress.     Breath sounds: Normal breath sounds.      ASSESSMENT/PLAN:   Essential hypertension Her repeat BP and home BP is at goal of < 150/90 No medication adjustment needed for now. Continue home BP monitoring. F/U in 4 weeks.      Andrena Mews, MD [place]

## 2023-01-28 NOTE — Assessment & Plan Note (Signed)
Her repeat BP and home BP is at goal of < 150/90 No medication adjustment needed for now. Continue home BP monitoring. F/U in 4 weeks.

## 2023-01-29 ENCOUNTER — Other Ambulatory Visit: Payer: Self-pay | Admitting: Family Medicine

## 2023-01-30 DIAGNOSIS — I2510 Atherosclerotic heart disease of native coronary artery without angina pectoris: Secondary | ICD-10-CM | POA: Diagnosis not present

## 2023-01-30 DIAGNOSIS — E1122 Type 2 diabetes mellitus with diabetic chronic kidney disease: Secondary | ICD-10-CM | POA: Diagnosis not present

## 2023-01-30 DIAGNOSIS — D509 Iron deficiency anemia, unspecified: Secondary | ICD-10-CM | POA: Diagnosis not present

## 2023-01-30 DIAGNOSIS — I129 Hypertensive chronic kidney disease with stage 1 through stage 4 chronic kidney disease, or unspecified chronic kidney disease: Secondary | ICD-10-CM | POA: Diagnosis not present

## 2023-01-30 DIAGNOSIS — N1831 Chronic kidney disease, stage 3a: Secondary | ICD-10-CM | POA: Diagnosis not present

## 2023-01-30 DIAGNOSIS — E785 Hyperlipidemia, unspecified: Secondary | ICD-10-CM | POA: Diagnosis not present

## 2023-02-19 ENCOUNTER — Other Ambulatory Visit: Payer: Self-pay | Admitting: Family Medicine

## 2023-02-19 DIAGNOSIS — Z1231 Encounter for screening mammogram for malignant neoplasm of breast: Secondary | ICD-10-CM

## 2023-02-28 ENCOUNTER — Ambulatory Visit: Payer: Medicare PPO | Admitting: Family Medicine

## 2023-02-28 ENCOUNTER — Encounter: Payer: Self-pay | Admitting: Family Medicine

## 2023-02-28 VITALS — BP 134/52 | HR 67 | Ht 62.0 in | Wt 136.0 lb

## 2023-02-28 DIAGNOSIS — K121 Other forms of stomatitis: Secondary | ICD-10-CM

## 2023-02-28 DIAGNOSIS — I10 Essential (primary) hypertension: Secondary | ICD-10-CM | POA: Diagnosis not present

## 2023-02-28 DIAGNOSIS — E1121 Type 2 diabetes mellitus with diabetic nephropathy: Secondary | ICD-10-CM | POA: Diagnosis not present

## 2023-02-28 HISTORY — DX: Other forms of stomatitis: K12.1

## 2023-02-28 LAB — POCT GLYCOSYLATED HEMOGLOBIN (HGB A1C): HbA1c, POC (controlled diabetic range): 6.9 % (ref 0.0–7.0)

## 2023-02-28 NOTE — Assessment & Plan Note (Signed)
??   Irritation. R/O malignancy. She already set up f/u appointment for biopsy. She will reach out to me if referral help is needed.

## 2023-02-28 NOTE — Patient Instructions (Signed)
It was nice seeing you today. Your A1C is at goal. Your BP looks good. No changes to your diabetic and blood pressure medication. I will see you in 3-4 months.

## 2023-02-28 NOTE — Assessment & Plan Note (Signed)
BP looks good. No medication adjustment needed.

## 2023-02-28 NOTE — Assessment & Plan Note (Signed)
Her A1C checked today is at her goal. No medication adjustment needed.

## 2023-02-28 NOTE — Progress Notes (Signed)
    SUBJECTIVE:   CHIEF COMPLAINT / HPI:   HTN/DM2: She is compliant with her meds. Here for f/u.  Mouth Sore: Her dentist found a sore on the roof of her mouth and asked her to get a biopsy. Per the patient, she has an appointment with ENT for biopsy. She wanted me to take a look at it.   PERTINENT  PMH / PSH: PMHx reviewed  OBJECTIVE:   Vitals:   02/28/23 0957 02/28/23 1017  BP: (!) 140/75 (!) 134/52  Pulse: 65 67  SpO2: 100%   Weight: 136 lb (61.7 kg)   Height: 5\' 2"  (1.575 m)     Physical Exam Vitals and nursing note reviewed.  HENT:     Head:     Comments: She has a small, healing sore on her left soft palate, which is difficult to see. Cardiovascular:     Rate and Rhythm: Normal rate and regular rhythm.     Heart sounds: Normal heart sounds. No murmur heard. Pulmonary:     Effort: Pulmonary effort is normal. No respiratory distress.     Breath sounds: Normal breath sounds. No wheezing.      ASSESSMENT/PLAN:   Essential hypertension BP looks good. No medication adjustment needed.  Diabetes mellitus type 2, controlled ([place]) Her A1C checked today is at her goal. No medication adjustment needed.  Soft palate ulceration ?? Irritation. R/O malignancy. She already set up f/u appointment for biopsy. She will reach out to me if referral help is needed.      Andrena Mews, MD [place]

## 2023-03-10 ENCOUNTER — Encounter: Payer: Self-pay | Admitting: Podiatry

## 2023-03-10 ENCOUNTER — Ambulatory Visit: Payer: Medicare PPO | Admitting: Podiatry

## 2023-03-10 DIAGNOSIS — M2041 Other hammer toe(s) (acquired), right foot: Secondary | ICD-10-CM

## 2023-03-10 DIAGNOSIS — M79674 Pain in right toe(s): Secondary | ICD-10-CM

## 2023-03-10 DIAGNOSIS — M2012 Hallux valgus (acquired), left foot: Secondary | ICD-10-CM

## 2023-03-10 DIAGNOSIS — M79675 Pain in left toe(s): Secondary | ICD-10-CM | POA: Diagnosis not present

## 2023-03-10 DIAGNOSIS — B351 Tinea unguium: Secondary | ICD-10-CM

## 2023-03-10 DIAGNOSIS — M2011 Hallux valgus (acquired), right foot: Secondary | ICD-10-CM

## 2023-03-10 NOTE — Progress Notes (Signed)
Chief Complaint  Patient presents with   Debridement    Trim corn 2nd toe right and check toenail hallux left - lateral border-tender   Diabetes    Last A1c was 6.9    SUBJECTIVE Patient with a history of diabetes mellitus presents to office today complaining of elongated, thickened nails that cause pain while ambulating in shoes.  Patient is unable to trim their own nails.  Patient also states that she develops calluses that are very painful and symptomatic especially in toes closed shoes.  She would like to have them evaluated.  Patient is here for further evaluation and treatment.  Past Medical History:  Diagnosis Date   Anemia    Aortic arch atherosclerosis ([place]) 08/29/2017   Cancer ([place])    Carpal tunnel syndrome 02/19/2007   Qualifier: Diagnosis of  By: Samara Snide     Coronary artery disease    a. Remote PTCA of the PDA;  b. 02/2009 PCI/CBA to the D1;  c. 11/2011 Cath: LM nl, LAD 20p, 22m, D1 patent, RI 20p, LCX nl, RCA 30d, EF 55-65%.// Myoview 04/2020: EF 68, no ischemia or infarction, low risk    CTS (carpal tunnel syndrome)    Diabetes mellitus without complication ([place])    Dysphagia 11/13/2018   GERD (gastroesophageal reflux disease)    History of papillary adenocarcinoma of thyroid 1992   right thyroid lobectomy   History of thyroid cancer    Hypertension    Hypothyroid    Hypothyroidism 02/19/2007   Qualifier: Diagnosis of  By: Samara Snide     IBS (irritable bowel syndrome) 12/27/2014   Nodule of right lung 08/29/2017   3 mm stable nodule right upper lobe on CTA Chest 08/28/17   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 08/29/2017   Subscapular pain, left 08/29/2017    Allergies  Allergen Reactions   Codeine Nausea And Vomiting   Sulfamethoxazole-Trimethoprim Nausea And Vomiting   Tramadol Nausea And Vomiting     OBJECTIVE General Patient is awake, alert, and oriented x 3 and in no acute distress. Derm Skin is dry and supple bilateral.  Negative open lesions or macerations. Remaining integument unremarkable. Nails are tender, long, thickened and dystrophic with subungual debris, consistent with onychomycosis, 1-5 bilateral. No signs of infection noted.  There is some hyperkeratotic calluses especially overlying the PIPJ of the second digit right foot as well as the bilateral interdigital areas of the bunion and the adjacent toe Vasc  DP and PT pedal pulses palpable bilaterally. Temperature gradient within normal limits.  Neuro Epicritic and protective threshold sensation diminished bilaterally.  Musculoskeletal Exam hallux valgus formerly noted with increased hallux abductus angle bilateral.  Hammertoe contracture also noted to the second digit of the right foot  ASSESSMENT 1. Diabetes Mellitus w/ peripheral neuropathy 2.  Pain due to onychomycosis of toenails bilateral 3.  Hallux valgus bilateral and hammertoe right second digit 4.  Symptomatic calluses bilateral toes  PLAN OF CARE 1. Patient evaluated today. 2. Instructed to maintain good pedal hygiene and foot care. Stressed importance of controlling blood sugar.  3. Mechanical debridement of nails 1-5 bilaterally performed using a nail nipper. Filed with dremel without incident.  4.  Today we did discuss surgical correction of the bunion and hammertoe deformity that but the patient is not interested in surgery at this moment.  She is wanting to pursue more conservative treatment to help alleviate the symptomatic calluses that develop because of the bunion and hammertoe deformity 5.  Light excisional debridement of the calluses was performed today using a 312 scalpel without incident or bleeding 6.  Silicone toe spacers were provided for the patient as well as a silicone toe cap to alleviate pressure from the calluses 7.  Recommend wide fitting shoes that do not irritate the toebox area 8.  Return to clinic as needed    Edrick Kins, DPM [facility]  Dr.  Edrick Kins, DPM    [address], [place] [address]                Office [phone]  Fax [phone]

## 2023-03-11 NOTE — Progress Notes (Signed)
Cardiology Office Note    Date:  03/18/2023   ID:  [name], [name] 06-Dec-1941, MRN [id number]   PCP:  [name], MD   [facility]  Cardiologist:  Jenkins Rouge, MD   Advanced Practice Provider:  No care team member to display   History of Present Illness:  [name] is a 82 y.o. female with history of CAD status post NSTEMI POBA to PDA 2001, cutting POBA to diagonal 2010, cath 2012 PDA and diagonal sites patent mild nonobstructive disease elsewhere, no ischemia on Myoview 2015, hypertension, HLD, DM, chronic anemia requiring iron infusions.  I have not seen her since 2021   Patient comes in for yearly f/u. Denies chest pain, dyspnea, dizziness, edema, or presyncope. Has lost 8 lbs and BP has been lower. PCP stopped amlodipine and then decreased lisinopril/HCTZ. Crt 1.54 11/02/21-referred to kidney specialists. 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 No regular exercise outside of that.  Has a soft palate ulceration on roof of her mouth Seeing ENT for ? 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 Remote PTCA of the PDA;  b. 02/2009 PCI/CBA to the D1;  c. 11/2011 Cath: LM nl, LAD 20p, 57m, D1 patent, RI 20p, LCX nl, RCA 30d, EF 55-65%.// Myoview 04/2020: EF 61, no ischemia or infarction, low risk    CTS (carpal tunnel syndrome)    Diabetes mellitus without complication ([place])    Dysphagia 11/13/2018   GERD (gastroesophageal reflux disease)    History of papillary adenocarcinoma of thyroid 1992   right thyroid lobectomy   History of thyroid cancer    Hypertension    Hypothyroid    Hypothyroidism 02/19/2007   Qualifier: Diagnosis of  By: Samara Snide     IBS (irritable bowel syndrome)  12/27/2014   Nodule of right lung 08/29/2017   3 mm stable nodule right upper lobe on CTA Chest 08/28/17   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 08/29/2017   Subscapular pain, left 08/29/2017    Past Surgical History:  Procedure Laterality Date   ANGIOPLASTY     PDA 90-40 %   BARTHOLIN GLAND CYST EXCISION  09-13-2004   Keratosis- no CA   BREAST BIOPSY     LEFT SPOT REMOVED   BREAST EXCISIONAL BIOPSY Left    CARDIAC CATHETERIZATION  03/15/2009   stenosis of diagonal branch of LAD- PTCA performed   CHOLECYSTECTOMY  08-23-2002   CORONARY ANGIOPLASTY     ENDOMETRIAL BIOPSY  02-20-1998   LEFT HEART CATHETERIZATION WITH CORONARY ANGIOGRAM N/A 11/26/2011   Procedure: LEFT HEART CATHETERIZATION WITH CORONARY ANGIOGRAM;  Surgeon: Lennart Gladish M [place], MD;  Location: [facility] CATH LAB;  Service: Cardiovascular;  Laterality: N/A;   PTCA  10/23/2000 and 03/15/09   THYROIDECTOMY Right 10-06-1991   Hashimotos and papillary carcinoma   TONSILLECTOMY     TUBAL LIGATION      Current Medications: Current Meds  Medication Sig   Accu-Chek FastClix Lancets MISC USE ONE LANCET TO TEST THREE TIMES A DAY   ACCU-CHEK GUIDE test strip USE ONE STRIP TO TEST THREE TIMES A DAY   aspirin 81 MG chewable tablet Chew  81 mg by mouth daily.   atorvastatin (LIPITOR) 20 MG tablet TAKE ONE TABLET BY MOUTH DAILY AT 6PM   Blood Glucose Monitoring Suppl (ACCU-CHEK GUIDE) w/Device KIT USE AS DIRECTED IN THE MORNING, AT NOON AND AT BEDTIME Strength: w/Device   carvedilol (COREG) 25 MG tablet TAKE 1 TABLET BY MOUTH TWICE A DAY WITH MEALS   ferrous sulfate 325 (65 FE) MG tablet Take 325 mg by mouth daily with breakfast.   fluticasone (FLONASE) 50 MCG/ACT nasal spray Place into the nose.   insulin glargine (LANTUS) 100 UNIT/ML injection Inject 0.05 mLs (5 Units total) into the skin daily.   Insulin Syringe-Needle U-100 (RELION INSULIN SYRINGE) 31G X 15/64" 1 ML MISC USE AS DIRECTED THREE TIMES DAILY   isosorbide mononitrate  (IMDUR) 60 MG 24 hr tablet TAKE 1.5 TABLETS BY MOUTH DAILY   JARDIANCE 25 MG TABS tablet TAKE ONE TABLET BY MOUTH DAILY   lisinopril-hydrochlorothiazide (ZESTORETIC) 20-12.5 MG tablet TAKE ONE TABLET BY MOUTH DAILY   metFORMIN (GLUCOPHAGE-XR) 500 MG 24 hr tablet Take 1 tablet (500 mg total) by mouth 2 (two) times daily.   Multiple Vitamin (MULTIVITAMIN WITH MINERALS) TABS tablet Take 1 tablet by mouth daily. Reported on 04/02/2016   nitroGLYCERIN (NITROSTAT) 0.4 MG SL tablet Place 1 tablet (0.4 mg total) under the tongue every 5 (five) minutes as needed for chest pain.     Allergies:   Codeine, Sulfamethoxazole-trimethoprim, and Tramadol   Social History   Socioeconomic History   Marital status: Widowed    Spouse name: Not on file   Number of children: 6   Years of education: 10   Highest education level: Not on file  Occupational History   Occupation: Housekeeper    Employer: [employer]  Tobacco Use   Smoking status: Former    Types: Cigarettes    Quit date: 12/23/1980    Years since quitting: 42.2   Smokeless tobacco: Former    Types: Chew    Quit date: 08/23/1994  Vaping Use   Vaping Use: Never used  Substance and Sexual Activity   Alcohol use: No   Drug use: No   Sexual activity: Not Currently    Birth control/protection: Surgical  Other Topics Concern   Not on file  Social History Narrative   Lives in [place] with Granddaughter.  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  She is not routinely exercising.   Has her GED   Social Determinants of Radio broadcast assistant Strain: Not on file  Food Insecurity: Not on file  Transportation Needs: Not on file  Physical Activity: Not on file  Stress: Not on file  Social Connections: Not on file     Family History:  The patient's  family history includes Breast cancer (age of onset: 15) in her daughter; Cancer in her granddaughter; Diabetes in her mother; Diabetes Mellitus II in her brother and brother; Epilepsy  in her brother; Heart attack in her brother, brother, and sister; Hypertension in her mother; Other in her father.   ROS:   Please see the history of present illness.    ROS All other systems reviewed and are negative.   PHYSICAL EXAM:   VS:  BP (!) 158/68   Pulse 73   Ht 5' 2.5" (1.588 m)   Wt 137 lb 12.8 oz (62.5 kg)   LMP 11/24/1992   BMI 24.80 kg/m   Physical Exam   Affect appropriate Healthy:  appears stated age 4:  normal Neck supple with no adenopathy JVP normal right bruits no thyromegaly Lungs clear with no wheezing and good diaphragmatic motion Heart:  S1/S2 no murmur, no rub, gallop or click PMI normal Abdomen: benighn, BS positve, no tenderness, no AAA no bruit.  No HSM or HJR Distal pulses intact with no bruits No edema Neuro non-focal Skin warm and dry No muscular weakness   Wt Readings from Last 3 Encounters:  03/18/23 137 lb 12.8 oz (62.5 kg)  02/28/23 136 lb (61.7 kg)  01/28/23 137 lb 2 oz (62.2 kg)      Studies/Labs Reviewed:   EKG:  03/18/2023 SR rate 73 LVH T inversions inferior lateral leads   Recent Labs: 08/06/2022: ALT 21; BUN 21; Creatinine 1.11; Hemoglobin 10.1; Platelet Count 143; Potassium 4.6; Sodium 142   Lipid Panel    Component Value Date/Time   CHOL 125 12/31/2022 1017   TRIG 75 12/31/2022 1017   HDL 55 12/31/2022 1017   CHOLHDL 2.3 12/31/2022 1017   CHOLHDL 2.2 08/06/2016 1135   VLDL 24 08/06/2016 1135   LDLCALC 55 12/31/2022 1017   LDLDIRECT 76 02/06/2011 1134    Additional studies/ records that were reviewed today include:  Myoview 04/26/2020 EF 69, no ischemia or infarction, low risk   Carotid US 03/31/2015 Bilateral ICA 1-39   Myoview 01/19/2014 No ischemia, EF 70; inferolateral scar   Myoview 03/07/2013 Inferolateral scar, no ischemia, EF 72   Cardiac catheterization 11/26/2011 LAD proximal 20, mid 30; D1 patent RI proximal 20 LCx small without significant disease RCA patent; PDA patent, 35 prior to  bifurcation EF 55-65   Cardiac catheterization 03/14/2009 LAD mid 30-40; BX proximal 80-90 RCA 30; PDA 50 Normal LV function PCI: Cutting Balloon angioplasty to the diagonal   Echocardiogram 11/19/2006 EF 55    PLAN:  In order of problems listed above:   CAD:  PTCA PDA in 2001 and diagonal in 2010 with patent stents on cath in 2012 Myovue done 04/26/20 low risk with no ischemia EF 69%  Continue medical Rx. No angina. 150 min exercise daily.    HTN:  has been running low and amlodipine stopped and lisinopril/HCTZ decreased to one tablet by PCP. She monitors at home.   DM2:   A1c 6.9  on Jardiance    HLD continue statin LDL at goal 55 12/31/22    Anemia:  followed by heme. Most recent Hct stable 32    Carotid bruit- duplex 11/30/21 plaque no stenosis 1-39%   F/U in a year   Signed, Jenkins Rouge, MD  03/18/2023 10:46 AM    [facility] [address], [place], [place]  [address] Phone: [phone]; Fax: [phone]

## 2023-03-14 ENCOUNTER — Ambulatory Visit: Payer: Self-pay

## 2023-03-14 NOTE — Patient Outreach (Signed)
  Care Coordination   Follow Up Visit Note   03/14/2023 Name: [name] MRN: [id number] DOB: August 05, 1941  [name] is a 82 y.o. year old female who sees [name], MD for primary care. I spoke with  [name] by phone today.  What matters to the patients health and wellness today?  [name]'s blood sugars have been ranging between 101 and 142. Her blood pressure readings were 136/68 with a heart rate of 64, 128/61 with a heart rate of 61. She reports that she is eating and sleeping well, but her family feels that she could eat more. She is staying active and feels good overall. I will be following up with her in two months.    Goals Addressed             This Visit's Progress    I continue to monitor my blood sugars and bloodpressure.       Patient Goals/Self Care Activities: -Patient/Caregiver will self-administer medications as prescribed as evidenced by self-report/primary caregiver report  -Patient/Caregiver will attend all scheduled provider appointments as evidenced by clinician review of documented attendance to scheduled appointments and patient/caregiver report -Patient/Caregiver will call pharmacy for medication refills as evidenced by patient report and review of pharmacy fill history as appropriate -Patient/Caregiver will call provider office for new concerns or questions as evidenced by review of documented incoming telephone call notes and patient report -Patient/Caregiver verbalizes understanding of plan -Patient/Caregiver will focus on medication adherence by taking medications as prescribed  -Calls provider office for new concerns, questions, or BP outside discussed parameters -Checks BP and records as discussed -Follows a low sodium diet/DASH diet -check blood sugar at prescribed times -check blood sugar if I feel it is too high or too low -record values and write them down take them to all doctor visits           SDOH  assessments and interventions completed:  No     Care Coordination Interventions:  Yes, provided   Interventions Today    Flowsheet Row Most Recent Value  Chronic Disease   Chronic disease during today's visit Diabetes, Hypertension (HTN)  General Interventions   General Interventions Discussed/Reviewed General Interventions Reviewed, Doctor Visits  Exercise Interventions   Exercise Discussed/Reviewed Physical Activity  Physical Activity Discussed/Reviewed Physical Activity Reviewed        Follow up plan: Follow up call scheduled for 06/13/23  9 am    Encounter Outcome:  Pt. Visit Completed   Lazaro Arms RN, BSN, [facility]   Phone: [phone]

## 2023-03-14 NOTE — Patient Instructions (Signed)
Visit Information  Thank you for taking time to visit with me today. Please don't hesitate to contact me if I can be of assistance to you.   Following are the goals we discussed today:   Goals Addressed             This Visit's Progress    I continue to monitor my blood sugars and bloodpressure.       Patient Goals/Self Care Activities: -Patient/Caregiver will self-administer medications as prescribed as evidenced by self-report/primary caregiver report  -Patient/Caregiver will attend all scheduled provider appointments as evidenced by clinician review of documented attendance to scheduled appointments and patient/caregiver report -Patient/Caregiver will call pharmacy for medication refills as evidenced by patient report and review of pharmacy fill history as appropriate -Patient/Caregiver will call provider office for new concerns or questions as evidenced by review of documented incoming telephone call notes and patient report -Patient/Caregiver verbalizes understanding of plan -Patient/Caregiver will focus on medication adherence by taking medications as prescribed  -Calls provider office for new concerns, questions, or BP outside discussed parameters -Checks BP and records as discussed -Follows a low sodium diet/DASH diet -check blood sugar at prescribed times -check blood sugar if I feel it is too high or too low -record values and write them down take them to all doctor visits           Our next appointment is by telephone on 06/13/23 at 9 am  Please call the care guide team at [phone] if you need to cancel or reschedule your appointment.   If you are experiencing a Mental Health or [place] or need someone to talk to, please call 1-800-273-TALK (toll free, 24 hour hotline)  Patient verbalizes understanding of instructions and care plan provided today and agrees to view in [place]. Active MyChart status and patient understanding of how to access  instructions and care plan via MyChart confirmed with patient.      Lazaro Arms RN, BSN, [facility]   Phone: [phone]

## 2023-03-18 ENCOUNTER — Encounter: Payer: Self-pay | Admitting: Cardiovascular Disease

## 2023-03-18 ENCOUNTER — Ambulatory Visit: Payer: Medicare PPO | Attending: Cardiovascular Disease | Admitting: Cardiovascular Disease

## 2023-03-18 VITALS — BP 158/68 | HR 73 | Ht 62.5 in | Wt 137.8 lb

## 2023-03-18 DIAGNOSIS — E785 Hyperlipidemia, unspecified: Secondary | ICD-10-CM

## 2023-03-18 DIAGNOSIS — I2510 Atherosclerotic heart disease of native coronary artery without angina pectoris: Secondary | ICD-10-CM | POA: Diagnosis not present

## 2023-03-18 DIAGNOSIS — I10 Essential (primary) hypertension: Secondary | ICD-10-CM | POA: Diagnosis not present

## 2023-03-18 MED ORDER — NITROGLYCERIN 0.4 MG SL SUBL: 0.4000 mg | SUBLINGUAL_TABLET | SUBLINGUAL | 3 refills | Status: DC | PRN

## 2023-03-18 NOTE — Patient Instructions (Signed)
Medication Instructions:  Your physician recommends that you continue on your current medications as directed. Please refer to the Current Medication list given to you today.  *If you need a refill on your cardiac medications before your next appointment, please call your pharmacy*  Lab Work: If you have labs (blood work) drawn today and your tests are completely normal, you will receive your results only by: MyChart Message (if you have MyChart) OR A paper copy in the mail If you have any lab test that is abnormal or we need to change your treatment, we will call you to review the results.  Testing/Procedures: None ordered today.  Follow-Up: At [facility], you and your health needs are our priority.  As part of our continuing mission to provide you with exceptional heart care, we have created designated Provider Care Teams.  These Care Teams include your primary Cardiologist (physician) and Advanced Practice Providers (APPs -  Physician Assistants and Nurse Practitioners) who all work together to provide you with the care you need, when you need it.  We recommend signing up for the patient portal called "MyChart".  Sign up information is provided on this After Visit Summary.  MyChart is used to connect with patients for Virtual Visits (Telemedicine).  Patients are able to view lab/test results, encounter notes, upcoming appointments, etc.  Non-urgent messages can be sent to your provider as well.   To learn more about what you can do with MyChart, go to [email or web address].    Your next appointment:   1 year(s)  Provider:   Peter Nishan, MD      

## 2023-03-21 DIAGNOSIS — H9311 Tinnitus, right ear: Secondary | ICD-10-CM | POA: Diagnosis not present

## 2023-03-21 DIAGNOSIS — H903 Sensorineural hearing loss, bilateral: Secondary | ICD-10-CM | POA: Diagnosis not present

## 2023-03-29 ENCOUNTER — Other Ambulatory Visit: Payer: Self-pay | Admitting: Family Medicine

## 2023-04-01 DIAGNOSIS — Z794 Long term (current) use of insulin: Secondary | ICD-10-CM | POA: Diagnosis not present

## 2023-04-01 DIAGNOSIS — H524 Presbyopia: Secondary | ICD-10-CM | POA: Diagnosis not present

## 2023-04-01 DIAGNOSIS — H5213 Myopia, bilateral: Secondary | ICD-10-CM | POA: Diagnosis not present

## 2023-04-01 DIAGNOSIS — H52203 Unspecified astigmatism, bilateral: Secondary | ICD-10-CM | POA: Diagnosis not present

## 2023-04-01 DIAGNOSIS — E119 Type 2 diabetes mellitus without complications: Secondary | ICD-10-CM | POA: Diagnosis not present

## 2023-04-01 DIAGNOSIS — Z961 Presence of intraocular lens: Secondary | ICD-10-CM | POA: Diagnosis not present

## 2023-04-08 ENCOUNTER — Ambulatory Visit: Payer: Medicare PPO

## 2023-04-10 ENCOUNTER — Ambulatory Visit
Admission: RE | Admit: 2023-04-10 | Discharge: 2023-04-10 | Disposition: A | Discharge status: Home / Self Care | Payer: Medicare PPO | Source: Ambulatory Visit | Attending: Family Medicine | Admitting: Family Medicine

## 2023-04-10 DIAGNOSIS — Z1231 Encounter for screening mammogram for malignant neoplasm of breast: Secondary | ICD-10-CM | POA: Diagnosis not present

## 2023-04-11 ENCOUNTER — Other Ambulatory Visit: Payer: Self-pay | Admitting: Cardiovascular Disease

## 2023-06-01 ENCOUNTER — Other Ambulatory Visit: Payer: Self-pay | Admitting: Family Medicine

## 2023-06-03 ENCOUNTER — Telehealth: Payer: Self-pay | Admitting: *Deleted

## 2023-06-03 NOTE — Progress Notes (Signed)
  Care Coordination Note  06/03/2023 Name: [name] MRN: [id number] DOB: 22-Oct-1941  [name] is a 82 y.o. year old female who is a primary care patient of Doreene Eland, MD and is actively engaged with the care management team. I reached out to [name] by phone today to assist with re-scheduling a follow up visit with the RN Case Manager  Follow up plan: Unsuccessful telephone outreach attempt made. A HIPAA compliant phone message was left for the patient providing contact information and requesting a return call.   [facility]  Care Coordination Care Guide  Direct Dial: [phone]

## 2023-06-09 NOTE — Progress Notes (Signed)
  Care Coordination Note  06/09/2023 Name: [name] MRN: [id number] DOB: July 26, 1941  [name] is a 82 y.o. year old female who is a primary care patient of Doreene Eland, MD and is actively engaged with the care management team. I reached out to [name] by phone today to assist with re-scheduling a follow up visit with the RN Case Manager  Follow up plan: Telephone appointment with care management team member scheduled for:06/18/23  [facility] Coordination Care Guide  Direct Dial: [phone]

## 2023-06-18 ENCOUNTER — Ambulatory Visit: Payer: Self-pay

## 2023-06-18 NOTE — Patient Instructions (Signed)
Visit Information  Thank you for taking time to visit with me today. Please don't hesitate to contact me if I can be of assistance to you.   Following are the goals we discussed today:   Goals Addressed             This Visit's Progress    I continue to monitor my blood sugars and blood pressure.       Patient Goals/Self Care Activities: -Patient/Caregiver will self-administer medications as prescribed as evidenced by self-report/primary caregiver report  -Patient/Caregiver will attend all scheduled provider appointments as evidenced by clinician review of documented attendance to scheduled appointments and patient/caregiver report -Patient/Caregiver will call pharmacy for medication refills as evidenced by patient report and review of pharmacy fill history as appropriate -Patient/Caregiver will call provider office for new concerns or questions as evidenced by review of documented incoming telephone call notes and patient report -Patient/Caregiver verbalizes understanding of plan -Patient/Caregiver will focus on medication adherence by taking medications as prescribed  -Calls provider office for new concerns, questions, or BP outside discussed parameters -Checks BP and records as discussed -Follows a low sodium diet/DASH diet -check blood sugar at prescribed times --record values and write them down take them to all doctor visits   -Go to all appointments         Our next appointment is by telephone on 08/20/23 at 9 am  Please call the care guide team at [phone] if you need to cancel or reschedule your appointment.   If you are experiencing a Mental Health or Behavioral Health Crisis or need someone to talk to, please call 1-800-273-TALK (toll free, 24 hour hotline)  Patient verbalizes understanding of instructions and care plan provided today and agrees to view in MyChart. Active MyChart status and patient understanding of how to access instructions and care plan via  MyChart confirmed with patient.     Juanell Fairly RN, BSN, [facility] Care Coordinator [facility]   Phone: [phone]

## 2023-06-18 NOTE — Patient Outreach (Signed)
  Care Coordination   Follow Up Visit Note   06/18/2023 Name: MCKALA PANTALEON MRN: [id number] DOB: 08/13/1941  [name] is a 82 y.o. year old female who sees Doreene Eland, MD for primary care. I spoke with  [name] by phone today.  What matters to the patients health and wellness today?  Mrs. Kenney is doing well. The dentist found a spot on the roof of her mouth and wants to do a biopsy. The cost of the procedure is $900, but she is managing to gather the funds and relying on her spiritual practices to stay calm. Her blood sugar levels range from 79 to 134, and her blood pressure has been as low as 101/53 with a heart rate of 77, and as high as 152/69 with a heart rate of 70. She continues to take her medications as prescribed and monitors her diet diligently.    Goals Addressed             This Visit's Progress    I continue to monitor my blood sugars and blood pressure.       Patient Goals/Self Care Activities: -Patient/Caregiver will self-administer medications as prescribed as evidenced by self-report/primary caregiver report  -Patient/Caregiver will attend all scheduled provider appointments as evidenced by clinician review of documented attendance to scheduled appointments and patient/caregiver report -Patient/Caregiver will call pharmacy for medication refills as evidenced by patient report and review of pharmacy fill history as appropriate -Patient/Caregiver will call provider office for new concerns or questions as evidenced by review of documented incoming telephone call notes and patient report -Patient/Caregiver verbalizes understanding of plan -Patient/Caregiver will focus on medication adherence by taking medications as prescribed  -Calls provider office for new concerns, questions, or BP outside discussed parameters -Checks BP and records as discussed -Follows a low sodium diet/DASH diet -check blood sugar at prescribed times --record values and  write them down take them to all doctor visits   -Go to all appointments         SDOH assessments and interventions completed:  No     Care Coordination Interventions:  Yes, provided   Interventions Today    Flowsheet Row Most Recent Value  Chronic Disease   Chronic disease during today's visit Diabetes, Hypertension (HTN)  General Interventions   General Interventions Discussed/Reviewed General Interventions Reviewed, Doctor Visits  Doctor Visits Discussed/Reviewed Doctor Visits Discussed  Mental Health Interventions   Mental Health Discussed/Reviewed Mental Health Discussed  Nutrition Interventions   Nutrition Discussed/Reviewed Nutrition Discussed  Pharmacy Interventions   Pharmacy Dicussed/Reviewed Pharmacy Topics Discussed  Safety Interventions   Safety Discussed/Reviewed Safety Discussed        Follow up plan: Follow up call scheduled for 08/20/23  9 am    Encounter Outcome:  Pt. Visit Completed   Juanell Fairly RN, BSN, [facility] Care Coordinator [facility]   Phone: [phone]

## 2023-07-07 DIAGNOSIS — K1120 Sialoadenitis, unspecified: Secondary | ICD-10-CM | POA: Diagnosis not present

## 2023-07-10 DIAGNOSIS — E1122 Type 2 diabetes mellitus with diabetic chronic kidney disease: Secondary | ICD-10-CM | POA: Diagnosis not present

## 2023-07-10 DIAGNOSIS — I252 Old myocardial infarction: Secondary | ICD-10-CM | POA: Diagnosis not present

## 2023-07-10 DIAGNOSIS — E785 Hyperlipidemia, unspecified: Secondary | ICD-10-CM | POA: Diagnosis not present

## 2023-07-10 DIAGNOSIS — M1990 Unspecified osteoarthritis, unspecified site: Secondary | ICD-10-CM | POA: Diagnosis not present

## 2023-07-10 DIAGNOSIS — Z794 Long term (current) use of insulin: Secondary | ICD-10-CM | POA: Diagnosis not present

## 2023-07-10 DIAGNOSIS — N1831 Chronic kidney disease, stage 3a: Secondary | ICD-10-CM | POA: Diagnosis not present

## 2023-07-10 DIAGNOSIS — E890 Postprocedural hypothyroidism: Secondary | ICD-10-CM | POA: Diagnosis not present

## 2023-07-10 DIAGNOSIS — E1151 Type 2 diabetes mellitus with diabetic peripheral angiopathy without gangrene: Secondary | ICD-10-CM | POA: Diagnosis not present

## 2023-07-10 DIAGNOSIS — I129 Hypertensive chronic kidney disease with stage 1 through stage 4 chronic kidney disease, or unspecified chronic kidney disease: Secondary | ICD-10-CM | POA: Diagnosis not present

## 2023-07-19 ENCOUNTER — Other Ambulatory Visit: Payer: Self-pay | Admitting: Family Medicine

## 2023-07-23 ENCOUNTER — Other Ambulatory Visit: Payer: Self-pay | Admitting: Family Medicine

## 2023-07-24 ENCOUNTER — Other Ambulatory Visit: Payer: Self-pay | Admitting: Cardiovascular Disease

## 2023-08-11 ENCOUNTER — Other Ambulatory Visit: Payer: Self-pay

## 2023-08-11 ENCOUNTER — Inpatient Hospital Stay: Payer: Medicare PPO | Attending: Internal Medicine

## 2023-08-11 DIAGNOSIS — D649 Anemia, unspecified: Secondary | ICD-10-CM | POA: Diagnosis not present

## 2023-08-11 DIAGNOSIS — N189 Chronic kidney disease, unspecified: Secondary | ICD-10-CM | POA: Diagnosis not present

## 2023-08-11 DIAGNOSIS — D638 Anemia in other chronic diseases classified elsewhere: Secondary | ICD-10-CM

## 2023-08-11 LAB — CBC WITH DIFFERENTIAL (CANCER CENTER ONLY)
Abs Immature Granulocytes: 0.01 10*3/uL (ref 0.00–0.07)
Basophils Absolute: 0.1 10*3/uL (ref 0.0–0.1)
Basophils Relative: 1 %
Eosinophils Absolute: 0.2 10*3/uL (ref 0.0–0.5)
Eosinophils Relative: 4 %
HCT: 32.5 % — ABNORMAL LOW (ref 36.0–46.0)
Hemoglobin: 10.2 g/dL — ABNORMAL LOW (ref 12.0–15.0)
Immature Granulocytes: 0 %
Lymphocytes Relative: 33 %
Lymphs Abs: 1.5 10*3/uL (ref 0.7–4.0)
MCH: 29 pg (ref 26.0–34.0)
MCHC: 31.4 g/dL (ref 30.0–36.0)
MCV: 92.3 fL (ref 80.0–100.0)
Monocytes Absolute: 0.5 10*3/uL (ref 0.1–1.0)
Monocytes Relative: 11 %
Neutro Abs: 2.2 10*3/uL (ref 1.7–7.7)
Neutrophils Relative %: 51 %
Platelet Count: 106 10*3/uL — ABNORMAL LOW (ref 150–400)
RBC: 3.52 MIL/uL — ABNORMAL LOW (ref 3.87–5.11)
RDW: 15.9 % — ABNORMAL HIGH (ref 11.5–15.5)
WBC Count: 4.4 10*3/uL (ref 4.0–10.5)
nRBC: 0 % (ref 0.0–0.2)

## 2023-08-11 LAB — CMP (CANCER CENTER ONLY)
ALT: 13 U/L (ref 0–44)
AST: 13 U/L — ABNORMAL LOW (ref 15–41)
Albumin: 4.1 g/dL (ref 3.5–5.0)
Alkaline Phosphatase: 70 U/L (ref 38–126)
Anion gap: 7 (ref 5–15)
BUN: 25 mg/dL — ABNORMAL HIGH (ref 8–23)
CO2: 31 mmol/L (ref 22–32)
Calcium: 10 mg/dL (ref 8.9–10.3)
Chloride: 105 mmol/L (ref 98–111)
Creatinine: 1.28 mg/dL — ABNORMAL HIGH (ref 0.44–1.00)
GFR, Estimated: 42 mL/min — ABNORMAL LOW (ref >60)
Glucose, Bld: 178 mg/dL — ABNORMAL HIGH (ref 70–99)
Potassium: 4.1 mmol/L (ref 3.5–5.1)
Sodium: 143 mmol/L (ref 135–145)
Total Bilirubin: 0.7 mg/dL (ref 0.3–1.2)
Total Protein: 7.1 g/dL (ref 6.5–8.1)

## 2023-08-11 LAB — FERRITIN: Ferritin: 13 ng/mL (ref 11–307)

## 2023-08-11 LAB — IRON AND IRON BINDING CAPACITY (CC-WL,HP ONLY)
Iron: 101 ug/dL (ref 28–170)
Saturation Ratios: 31 % (ref 10.4–31.8)
TIBC: 330 ug/dL (ref 250–450)
UIBC: 229 ug/dL (ref 148–442)

## 2023-08-13 ENCOUNTER — Inpatient Hospital Stay: Payer: Medicare PPO | Admitting: Hematology and Oncology

## 2023-08-13 VITALS — BP 158/64 | HR 87 | Temp 97.5°F | Resp 18 | Ht 62.5 in | Wt 139.1 lb

## 2023-08-13 DIAGNOSIS — N189 Chronic kidney disease, unspecified: Secondary | ICD-10-CM | POA: Diagnosis not present

## 2023-08-13 DIAGNOSIS — D649 Anemia, unspecified: Secondary | ICD-10-CM | POA: Diagnosis not present

## 2023-08-13 DIAGNOSIS — D638 Anemia in other chronic diseases classified elsewhere: Secondary | ICD-10-CM | POA: Diagnosis not present

## 2023-08-13 NOTE — Assessment & Plan Note (Signed)
History of diverticulitis.and  hemorrhoids.  Last colonoscopy was 07/02/2013    Work-up did not reveal thalassemia. IV iron treatment: March 2020   Lab review 01/19/2019: Hemoglobin 9.3, platelets 126 04/14/2020: Hemoglobin 9.9, platelets 131, MCV 89.7, B12 527, iron saturation 26%, ferritin 18, creatinine 1.04, calcium 11.3, folic acid 38.3  04/18/2020: SPEP: No M protein, kappa 32.4, ratio 1.43 erythropoietin 68.1  08/07/2020: Hemoglobin 9.4, MCV 87.9, platelets 139, creatinine 1.11, calcium 10.6, ferritin 30, iron saturation 23% 08/03/21: Hb 9.6, MCV: 91, Cr: 1.38, Ferritin:10, Iron Sat: 26%,  08/06/2022: Hemoglobin 10.1, MCV 90.9, platelets 143, iron saturation 28%, ferritin 16, creatinine 1.11 08/11/2023: Hemoglobin 10.2, MCV 92.3, platelets 106, iron saturation 31%, ferritin 13, creatinine 1.28   Possible cause is anemia of chronic kidney disease.   Given the fact that the hemoglobin continues to remain stable and she is not symptomatic we will continue to watch and monitor.   Recheck labs in 1 year and follow-up.

## 2023-08-13 NOTE — Progress Notes (Signed)
Patient Care Team: Doreene Eland, MD as PCP - General (Family Medicine) Wendall Stade, MD as PCP - Cardiology (Cardiology) Juanell Fairly, RN as Case Manager Oscar La, Craig Guess, MD (Inactive) as Consulting Physician (Obstetrics and Gynecology)  DIAGNOSIS:  Encounter Diagnosis  Name Primary?   Anemia of chronic disease Yes    CHIEF COMPLIANT: Follow-up of anemia   INTERVAL HISTORY: [name] is a 82 y.o. with above-mentioned history of anemia previously treated with oral and IV iron. She presents to the clinic today for a follow-up. Patient reports she feels fine. She denies dizziness light headiness.   ALLERGIES:  is allergic to codeine, sulfamethoxazole-trimethoprim, and tramadol.  MEDICATIONS:  Current Outpatient Medications  Medication Sig Dispense Refill   Accu-Chek FastClix Lancets MISC USE ONE LANCET TO TEST THREE TIMES A DAY 102 each 4   ACCU-CHEK GUIDE test strip USE ONE STRIP TO TEST THREE TIMES A DAY 100 strip 5   aspirin 81 MG chewable tablet Chew 81 mg by mouth daily.     atorvastatin (LIPITOR) 20 MG tablet TAKE ONE TABLET BY MOUTH DAILY AT 6PM 90 tablet 2   Blood Glucose Monitoring Suppl (ACCU-CHEK GUIDE) w/Device KIT USE AS DIRECTED IN THE MORNING, AT NOON AND AT BEDTIME Strength: w/Device 1 kit 0   carvedilol (COREG) 25 MG tablet TAKE 1 TABLET BY MOUTH TWICE A DAY WITH A MEAL 180 tablet 1   ferrous sulfate 325 (65 FE) MG tablet Take 325 mg by mouth daily with breakfast.     fluticasone (FLONASE) 50 MCG/ACT nasal spray Place into the nose.     Insulin Syringe-Needle U-100 (RELION INSULIN SYRINGE) 31G X 15/64" 1 ML MISC USE AS DIRECTED THREE TIMES DAILY 100 each 2   isosorbide mononitrate (IMDUR) 60 MG 24 hr tablet TAKE 1 AND 1/2 TABLET BY MOUTH DAILY 135 tablet 2   JARDIANCE 25 MG TABS tablet TAKE 1 TABLET BY MOUTH DAILY 90 tablet 1   LANTUS 100 UNIT/ML injection INJECT 5 UNITS INTO THE SKIN DAILY 10 mL 4   lisinopril-hydrochlorothiazide (ZESTORETIC)  20-12.5 MG tablet TAKE 1 TABLET BY MOUTH DAILY 90 tablet 1   metFORMIN (GLUCOPHAGE-XR) 500 MG 24 hr tablet Take 1 tablet (500 mg total) by mouth 2 (two) times daily. 180 tablet 1   Multiple Vitamin (MULTIVITAMIN WITH MINERALS) TABS tablet Take 1 tablet by mouth daily. Reported on 04/02/2016     nitroGLYCERIN (NITROSTAT) 0.4 MG SL tablet Place 1 tablet (0.4 mg total) under the tongue every 5 (five) minutes as needed for chest pain. 25 tablet 3   No current facility-administered medications for this visit.    PHYSICAL EXAMINATION: ECOG PERFORMANCE STATUS: 0 - Asymptomatic  Vitals:   08/13/23 0900  BP: (!) 158/64  Pulse: 87  Resp: 18  Temp: (!) 97.5 F (36.4 C)  SpO2: 100%   Filed Weights   08/13/23 0900  Weight: 139 lb 1.6 oz (63.1 kg)      LABORATORY DATA:  I have reviewed the data as listed    Latest Ref Rng & Units 08/11/2023    9:17 AM 08/06/2022    9:01 AM 11/20/2021    8:56 AM  CMP  Glucose 70 - 99 mg/dL 188  416  606   BUN 8 - 23 mg/dL 25  21  18    Creatinine 0.44 - 1.00 mg/dL 3.01  6.01  0.93   Sodium 135 - 145 mmol/L 143  142  140   Potassium 3.5 - 5.1  mmol/L 4.1  4.6  3.9   Chloride 98 - 111 mmol/L 105  106  100   CO2 22 - 32 mmol/L 31  30  26    Calcium 8.9 - 10.3 mg/dL 16.1  9.4  9.5   Total Protein 6.5 - 8.1 g/dL 7.1  6.9    Total Bilirubin 0.3 - 1.2 mg/dL 0.7  0.7    Alkaline Phos 38 - 126 U/L 70  69    AST 15 - 41 U/L 13  22    ALT 0 - 44 U/L 13  21      Lab Results  Component Value Date   WBC 4.4 08/11/2023   HGB 10.2 (L) 08/11/2023   HCT 32.5 (L) 08/11/2023   MCV 92.3 08/11/2023   PLT 106 (L) 08/11/2023   NEUTROABS 2.2 08/11/2023    ASSESSMENT & PLAN:  Anemia of chronic disease History of diverticulitis.and  hemorrhoids.  Last colonoscopy was 07/02/2013    Work-up did not reveal thalassemia. IV iron treatment: March 2020   Lab review 01/19/2019: Hemoglobin 9.3, platelets 126 04/14/2020: Hemoglobin 9.9, platelets 131, MCV 89.7, B12 527, iron  saturation 26%, ferritin 18, creatinine 1.04, calcium 11.3, folic acid 38.3  04/18/2020: SPEP: No M protein, kappa 32.4, ratio 1.43 erythropoietin 68.1  08/07/2020: Hemoglobin 9.4, MCV 87.9, platelets 139, creatinine 1.11, calcium 10.6, ferritin 30, iron saturation 23% 08/03/21: Hb 9.6, MCV: 91, Cr: 1.38, Ferritin:10, Iron Sat: 26%,  08/06/2022: Hemoglobin 10.1, MCV 90.9, platelets 143, iron saturation 28%, ferritin 16, creatinine 1.11 08/11/2023: Hemoglobin 10.2, MCV 92.3, platelets 106, iron saturation 31%, ferritin 13, creatinine 1.28   Possible cause is anemia of chronic kidney disease.   Given the fact that the hemoglobin continues to remain stable and she is not symptomatic we will continue to watch and monitor.   Recheck labs in 1 year and follow-up.   No orders of the defined types were placed in this encounter.  The patient has a good understanding of the overall plan. she agrees with it. she will call with any problems that may develop before the next visit here. Total time spent: 30 mins including face to face time and time spent for planning, charting and co-ordination of care   Tamsen Meek, MD 08/13/23    I Janan Ridge am acting as a Neurosurgeon for [employer]  I have reviewed the above documentation for accuracy and completeness, and I agree with the above.

## 2023-08-20 ENCOUNTER — Ambulatory Visit: Payer: Self-pay

## 2023-08-20 NOTE — Patient Outreach (Signed)
  Care Coordination   Follow Up Visit Note   08/20/2023 Name: [name] MRN: [id number] DOB: 08/19/1941  [name] is a 82 y.o. year old female who sees Doreene Eland, MD for primary care. I spoke with  [name] by phone today.  What matters to the patients health and wellness today?  [name] has reported that her condition remains stable following her annual medical evaluations with all her physicians. Her blood sugar levels consistently range between 121 and 155, while her blood pressure readings have ranged from 116/69 to 125/61. She continues to adhere to her prescribed medication regimen, maintain mobility, and follow a balanced diet.     Goals Addressed             This Visit's Progress    I continue to monitor my blood sugars and blood pressure.       Patient Goals/Self Care Activities: -Patient/Caregiver will self-administer medications as prescribed as evidenced by self-report/primary caregiver report  --Patient/Caregiver will call pharmacy for medication refills as evidenced by patient report and review of pharmacy fill history as appropriate -Patient/Caregiver will call provider office for new concerns or questions as evidenced by review of documented incoming telephone call notes and patient report -Patient/Caregiver will focus on medication adherence by taking medications as prescribed  -Calls provider office for new concerns, questions, or BP outside discussed parameters -Checks BP and records as discussed -Follows a low sodium diet/DASH diet -check blood sugar at prescribed times -continue your regiment           SDOH assessments and interventions completed:  No     Care Coordination Interventions:  Yes, provided   Interventions Today    Flowsheet Row Most Recent Value  Chronic Disease   Chronic disease during today's visit Diabetes, Hypertension (HTN)  General Interventions   General Interventions Discussed/Reviewed  General Interventions Discussed  Exercise Interventions   Exercise Discussed/Reviewed Physical Activity  Physical Activity Discussed/Reviewed Physical Activity Discussed  [house duties, church]  Nutrition Interventions   Nutrition Discussed/Reviewed Nutrition Discussed, Decreasing salt  Pharmacy Interventions   Pharmacy Dicussed/Reviewed Pharmacy Topics Discussed  Safety Interventions   Safety Discussed/Reviewed Safety Reviewed        Follow up plan: Follow up call scheduled for 10/21/23 9 am    Encounter Outcome:  Pt. Visit Completed   Juanell Fairly RN, BSN, [facility]   Care Coordinator Phone: [phone]

## 2023-08-20 NOTE — Patient Instructions (Signed)
Visit Information  Thank you for taking time to visit with me today. Please don't hesitate to contact me if I can be of assistance to you.   Following are the goals we discussed today:   Goals Addressed             This Visit's Progress    I continue to monitor my blood sugars and blood pressure.       Patient Goals/Self Care Activities: -Patient/Caregiver will self-administer medications as prescribed as evidenced by self-report/primary caregiver report  --Patient/Caregiver will call pharmacy for medication refills as evidenced by patient report and review of pharmacy fill history as appropriate -Patient/Caregiver will call provider office for new concerns or questions as evidenced by review of documented incoming telephone call notes and patient report -Patient/Caregiver will focus on medication adherence by taking medications as prescribed  -Calls provider office for new concerns, questions, or BP outside discussed parameters -Checks BP and records as discussed -Follows a low sodium diet/DASH diet -check blood sugar at prescribed times -continue your regiment           Our next appointment is by telephone on 10/21/23 at 9 am  Please call the care guide team at [phone] if you need to cancel or reschedule your appointment.   If you are experiencing a Mental Health or Behavioral Health Crisis or need someone to talk to, please call 1-800-273-TALK (toll free, 24 hour hotline)  Patient verbalizes understanding of instructions and care plan provided today and agrees to view in MyChart. Active MyChart status and patient understanding of how to access instructions and care plan via MyChart confirmed with patient.     Juanell Fairly RN, BSN, [facility] Phone: [phone]

## 2023-09-01 ENCOUNTER — Ambulatory Visit (INDEPENDENT_AMBULATORY_CARE_PROVIDER_SITE_OTHER): Payer: Medicare PPO

## 2023-09-01 VITALS — Ht 62.5 in | Wt 139.0 lb

## 2023-09-01 DIAGNOSIS — Z0000 Encounter for general adult medical examination without abnormal findings: Secondary | ICD-10-CM | POA: Diagnosis not present

## 2023-09-01 NOTE — Patient Instructions (Signed)
[name] , Thank you for taking time to come for your Medicare Wellness Visit. I appreciate your ongoing commitment to your health goals. Please review the following plan we discussed and let me know if I can assist you in the future.   Referrals/Orders/Follow-Ups/Clinician Recommendations: Aim for 30 minutes of exercise or brisk walking, 6-8 glasses of water, and 5 servings of fruits and vegetables each day.  This is a list of the screening recommended for you and due dates:  Health Maintenance  Topic Date Due   Eye exam for diabetics  05/09/2023   Hemoglobin A1C  05/31/2023   Yearly kidney health urinalysis for diabetes  06/19/2023   Flu Shot  07/24/2023   COVID-19 Vaccine (1 - 2023-24 season) Never done   Zoster (Shingles) Vaccine (1 of 2) 09/12/2023*   Complete foot exam   11/13/2023   Yearly kidney function blood test for diabetes  08/10/2024   Medicare Annual Wellness Visit  08/31/2024   DTaP/Tdap/Td vaccine (3 - Td or Tdap) 11/07/2026   Pneumonia Vaccine  Completed   DEXA scan (bone density measurement)  Completed   HPV Vaccine  Aged Out   Hepatitis C Screening  Discontinued  *Topic was postponed. The date shown is not the original due date.    Advanced directives: (Provided) Advance directive discussed with you today. I have provided a copy for you to complete at home and have notarized. Once this is complete, please bring a copy in to our office so we can scan it into your chart.   Next Medicare Annual Wellness Visit scheduled for next year: Yes

## 2023-09-01 NOTE — Progress Notes (Signed)
Subjective:   [name] is a 82 y.o. female who presents for Medicare Annual (Subsequent) preventive examination.  Visit Complete: Virtual  I connected with  [name] on 09/01/23 by a audio enabled telemedicine application and verified that I am speaking with the correct person using two identifiers.  Patient Location: Home  Provider Location: Home Office  I discussed the limitations of evaluation and management by telemedicine. The patient expressed understanding and agreed to proceed.  Vital Signs: Because this visit was a virtual/telehealth visit, some criteria may be missing or patient reported. Any vitals not documented were not able to be obtained and vitals that have been documented are patient reported.   Review of Systems     Cardiac Risk Factors include: advanced age (>72men, >21 women);diabetes mellitus;dyslipidemia;hypertension;sedentary lifestyle     Objective:    Today's Vitals   09/01/23 1029  Weight: 139 lb (63 kg)  Height: 5' 2.5" (1.588 m)   Body mass index is 25.02 kg/m.     09/01/2023   10:42 AM 02/28/2023    9:58 AM 01/28/2023    8:39 AM 12/31/2022    9:27 AM 11/12/2022   10:48 AM 10/29/2022   10:42 AM 09/24/2022   11:05 AM  Advanced Directives  Does Patient Have a Medical Advance Directive? No No No No No No No  Would patient like information on creating a medical advance directive? Yes (MAU/Ambulatory/Procedural Areas - Information given) No - Patient declined No - Patient declined No - Patient declined No - Patient declined No - Patient declined No - Patient declined    Current Medications (verified) Outpatient Encounter Medications as of 09/01/2023  Medication Sig   Accu-Chek FastClix Lancets MISC USE ONE LANCET TO TEST THREE TIMES A DAY   ACCU-CHEK GUIDE test strip USE ONE STRIP TO TEST THREE TIMES A DAY   aspirin 81 MG chewable tablet Chew 81 mg by mouth daily.   atorvastatin (LIPITOR) 20 MG tablet TAKE ONE TABLET BY MOUTH DAILY AT  6PM   Blood Glucose Monitoring Suppl (ACCU-CHEK GUIDE) w/Device KIT USE AS DIRECTED IN THE MORNING, AT NOON AND AT BEDTIME Strength: w/Device   carvedilol (COREG) 25 MG tablet TAKE 1 TABLET BY MOUTH TWICE A DAY WITH A MEAL   ferrous sulfate 325 (65 FE) MG tablet Take 325 mg by mouth daily with breakfast.   fluticasone (FLONASE) 50 MCG/ACT nasal spray Place into the nose.   Insulin Syringe-Needle U-100 (RELION INSULIN SYRINGE) 31G X 15/64" 1 ML MISC USE AS DIRECTED THREE TIMES DAILY   isosorbide mononitrate (IMDUR) 60 MG 24 hr tablet TAKE 1 AND 1/2 TABLET BY MOUTH DAILY   JARDIANCE 25 MG TABS tablet TAKE 1 TABLET BY MOUTH DAILY   LANTUS 100 UNIT/ML injection INJECT 5 UNITS INTO THE SKIN DAILY   lisinopril-hydrochlorothiazide (ZESTORETIC) 20-12.5 MG tablet TAKE 1 TABLET BY MOUTH DAILY   metFORMIN (GLUCOPHAGE-XR) 500 MG 24 hr tablet Take 1 tablet (500 mg total) by mouth 2 (two) times daily.   Multiple Vitamin (MULTIVITAMIN WITH MINERALS) TABS tablet Take 1 tablet by mouth daily. Reported on 04/02/2016   nitroGLYCERIN (NITROSTAT) 0.4 MG SL tablet Place 1 tablet (0.4 mg total) under the tongue every 5 (five) minutes as needed for chest pain.   No facility-administered encounter medications on file as of 09/01/2023.    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 Remote PTCA of the PDA;  b. 02/2009 PCI/CBA to the D1;  c. 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   History of thyroid cancer    Hypertension    Hypothyroid    Hypothyroidism 02/19/2007   Qualifier: Diagnosis of  By: Knox Royalty     IBS (irritable bowel syndrome) 12/27/2014   Nodule of right lung 08/29/2017   3 mm stable nodule right upper lobe on CTA Chest 08/28/17   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 08/29/2017   Subscapular pain, left 08/29/2017   Past Surgical History:  Procedure Laterality Date   ANGIOPLASTY     PDA 90-40 %   BARTHOLIN GLAND CYST EXCISION  09-13-2004   Keratosis- no CA   BREAST BIOPSY     LEFT SPOT REMOVED   BREAST EXCISIONAL BIOPSY Left    CARDIAC CATHETERIZATION  03/15/2009   stenosis of diagonal branch of LAD- PTCA performed   CHOLECYSTECTOMY  08-23-2002   CORONARY ANGIOPLASTY     ENDOMETRIAL BIOPSY  02-20-1998   LEFT HEART CATHETERIZATION WITH CORONARY ANGIOGRAM N/A 11/26/2011   Procedure: LEFT HEART CATHETERIZATION WITH CORONARY ANGIOGRAM;  Surgeon: Peter M Swaziland, MD;  Location: [facility] CATH LAB;  Service: Cardiovascular;  Laterality: N/A;   PTCA  10/23/2000 and 03/15/09   THYROIDECTOMY Right 10-06-1991   Hashimotos and papillary carcinoma   TONSILLECTOMY     TUBAL LIGATION     Family History  Problem Relation Age of Onset   Diabetes Mother        Deceased- hypoglycemic coma    Hypertension Mother    Other Father        unaware of his health history - died when pt was 82 yr old.   Heart attack Sister    Diabetes Mellitus II Brother    Heart attack Brother    Heart attack Brother    Diabetes Mellitus II Brother    Epilepsy Brother    Breast cancer Daughter 41   Cancer Granddaughter    Social History   Socioeconomic History   Marital status: Widowed    Spouse name: Not on file   Number of children: 6   Years of education: 10   Highest education level: Not on file  Occupational History   Occupation: Housekeeper    Employer: [employer]  Tobacco Use   Smoking  status: Former    Current packs/day: 0.00    Types: Cigarettes    Quit date: 12/23/1980    Years since quitting: 42.7   Smokeless tobacco: Former    Types: Chew    Quit date: 08/23/1994  Vaping Use   Vaping status: Never Used  Substance and Sexual Activity   Alcohol use: No   Drug use: No   Sexual activity: Not Currently    Birth control/protection: Surgical  Other Topics Concern   Not on file  Social History Narrative   Lives in [place] with Granddaughter.  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  She is not routinely exercising.   Has her GED   Social Determinants of Health   Financial Resource Strain: Low Risk  (09/01/2023)   Overall Financial Resource Strain (CARDIA)    Difficulty of Paying Living Expenses: Not hard at all  Food Insecurity: No Food Insecurity (09/01/2023)   Hunger Vital Sign    Worried About Running Out of Food in the Last Year: Never true    Ran Out of Food in the Last Year: Never true  Transportation Needs: No Transportation Needs (09/01/2023)   PRAPARE - Administrator, Civil Service (Medical): No    Lack of Transportation (Non-Medical): No  Physical Activity: Insufficiently Active (09/01/2023)   Exercise Vital Sign    Days of Exercise per Week: 3 days    Minutes of Exercise per Session: 30 min  Stress: No Stress Concern Present (09/01/2023)   [employer] of Occupational Health - Occupational Stress Questionnaire    Feeling of Stress : Not at all  Social Connections: Moderately Isolated (09/01/2023)   Social Connection and Isolation Panel [NHANES]    Frequency of Communication with Friends and Family: More than three times a week    Frequency of Social Gatherings with Friends and Family: Once a week    Attends Religious Services: 1 to 4 times per year    Active Member of [employer] or Organizations: No    Attends Banker Meetings: Never    Marital Status: Widowed    Tobacco Counseling Counseling given: Not  Answered   Clinical Intake:  Pre-visit preparation completed: Yes  Pain : No/denies pain     Diabetes: Yes CBG done?: No Did pt. bring in CBG monitor from home?: No  How often do you need to have someone help you when you read instructions, pamphlets, or other written materials from your doctor or pharmacy?: 1 - Never  Interpreter Needed?: No  Information entered by :: Kandis Fantasia LPN   Activities of Daily Living    09/01/2023   10:42 AM  In your present state of health, do you have any difficulty performing the following activities:  Hearing? 0  Vision? 0  Difficulty concentrating or making decisions? 0  Walking or climbing stairs? 0  Dressing or bathing? 0  Doing errands, shopping? 0  Preparing Food and eating ? N  Using the Toilet? N  In the past six months, have you accidently leaked urine? N  Do you have problems with loss of bowel control? N  Managing your Medications? N  Managing your Finances? N  Housekeeping or managing your Housekeeping? N    Patient Care Team: Doreene Eland, MD as PCP - General (Family Medicine) Wendall Stade, MD as PCP - Cardiology (Cardiology) Juanell Fairly, RN as Case Manager Oscar La, Craig Guess, MD (Inactive) as Consulting Physician (Obstetrics and Gynecology) Pa, [facility] Serena Croissant, MD as Consulting Physician (Hematology and Oncology)  Indicate any recent Medical Services you may have received from other than [facility] providers in the past year (date may be approximate).     Assessment:   This is a routine wellness examination for Nilaya.  Hearing/Vision screen Hearing Screening - Comments:: Denies hearing difficulties   Vision Screening - Comments:: Wears rx glasses - up to date with routine eye exams with Dr. Nile Riggs     Goals Addressed             This Visit's Progress  Remain active and independent         Depression Screen    09/01/2023   10:41 AM 02/28/2023    9:58 AM 01/28/2023    8:39 AM  12/31/2022    9:27 AM 10/29/2022   10:42 AM 09/24/2022   11:05 AM 06/18/2022   10:58 AM  PHQ 2/9 Scores  PHQ - 2 Score 0 0 0 0 0 0 0  PHQ- 9 Score  0 0 0 0 0 0    Fall Risk    09/01/2023   10:42 AM 02/28/2023    9:58 AM 01/28/2023    8:39 AM 12/31/2022    9:27 AM 11/12/2022   11:01 AM  Fall Risk   Falls in the past year? 0 0 0 0 1  Comment     3 weeks ago she tripped on a trash can lid, otherwise, she does not fall often  Number falls in past yr: 0 0 0 0 0  Injury with Fall? 0 0 0 0 0  Risk for fall due to : No Fall Risks      Follow up Falls prevention discussed;Education provided;Falls evaluation completed        MEDICARE RISK AT HOME: Medicare Risk at Home Any stairs in or around the home?: No If so, are there any without handrails?: No Home free of loose throw rugs in walkways, pet beds, electrical cords, etc?: Yes Adequate lighting in your home to reduce risk of falls?: Yes Life alert?: No Use of a cane, walker or w/c?: No Grab bars in the bathroom?: Yes Shower chair or bench in shower?: No Elevated toilet seat or a handicapped toilet?: Yes  TIMED UP AND GO:  Was the test performed?  No    Cognitive Function:        09/01/2023   10:42 AM  6CIT Screen  What Year? 0 points  What month? 0 points  What time? 0 points  Count back from 20 0 points  Months in reverse 0 points  Repeat phrase 0 points  Total Score 0 points    Immunizations Immunization History  Administered Date(s) Administered   Fluad Quad(high Dose 65+) 10/20/2020, 11/02/2021, 10/29/2022   Influenza Split 09/15/2012   Influenza Whole 10/11/2008, 09/28/2009, 10/22/2010, 09/04/2011   Influenza,inj,Quad PF,6+ Mos 10/15/2013, 10/14/2014, 11/28/2015, 11/05/2016, 09/30/2017, 09/29/2018, 10/19/2019   Pneumococcal Conjugate-13 11/28/2015   Pneumococcal Polysaccharide-23 06/22/1994, 11/26/2011   Td 06/27/2005   Tdap 11/07/2016   Zoster, Live 08/22/2015    TDAP status: Up to date  Flu Vaccine status:  Due, Education has been provided regarding the importance of this vaccine. Advised may receive this vaccine at local pharmacy or Health Dept. Aware to provide a copy of the vaccination record if obtained from local pharmacy or Health Dept. Verbalized acceptance and understanding.  Pneumococcal vaccine status: Up to date  Covid-19 vaccine status: Information provided on how to obtain vaccines.   Qualifies for Shingles Vaccine? Yes   Zostavax completed Yes   Shingrix Completed?: No.    Education has been provided regarding the importance of this vaccine. Patient has been advised to call insurance company to determine out of pocket expense if they have not yet received this vaccine. Advised may also receive vaccine at local pharmacy or Health Dept. Verbalized acceptance and understanding.  Screening Tests Health Maintenance  Topic Date Due   OPHTHALMOLOGY EXAM  05/09/2023   HEMOGLOBIN A1C  05/31/2023   Diabetic kidney evaluation - Urine ACR  06/19/2023   INFLUENZA  VACCINE  07/24/2023   COVID-19 Vaccine (1 - 2023-24 season) Never done   Zoster Vaccines- Shingrix (1 of 2) 09/12/2023 (Originally 12/01/1992)   FOOT EXAM  11/13/2023   Diabetic kidney evaluation - eGFR measurement  08/10/2024   Medicare Annual Wellness (AWV)  08/31/2024   DTaP/Tdap/Td (3 - Td or Tdap) 11/07/2026   Pneumonia Vaccine 68+ Years old  Completed   DEXA SCAN  Completed   HPV VACCINES  Aged Out   Hepatitis C Screening  Discontinued    Health Maintenance  Health Maintenance Due  Topic Date Due   OPHTHALMOLOGY EXAM  05/09/2023   HEMOGLOBIN A1C  05/31/2023   Diabetic kidney evaluation - Urine ACR  06/19/2023   INFLUENZA VACCINE  07/24/2023   COVID-19 Vaccine (1 - 2023-24 season) Never done    Colorectal cancer screening: No longer required.   Mammogram status: Completed 04/10/23. Repeat every year  Bone Density status: Completed 10/13/19. Results reflect: Bone density results: NORMAL. Repeat every 5  years.  Lung Cancer Screening: (Low Dose CT Chest recommended if Age 16-80 years, 20 pack-year currently smoking OR have quit w/in 15years.) does not qualify.   Lung Cancer Screening Referral: n/a  Additional Screening:  Hepatitis C Screening: does not qualify  Vision Screening: Recommended annual ophthalmology exams for early detection of glaucoma and other disorders of the eye. Is the patient up to date with their annual eye exam?  Yes  Who is the provider or what is the name of the office in which the patient attends annual eye exams? [facility] If pt is not established with a provider, would they like to be referred to a provider to establish care? No .   Dental Screening: Recommended annual dental exams for proper oral hygiene  Diabetic Foot Exam: Diabetic Foot Exam: Completed 11/12/22  Community Resource Referral / Chronic Care Management: CRR required this visit?  No   CCM required this visit?  No     Plan:     I have personally reviewed and noted the following in the patient's chart:   Medical and social history Use of alcohol, tobacco or illicit drugs  Current medications and supplements including opioid prescriptions. Patient is not currently taking opioid prescriptions. Functional ability and status Nutritional status Physical activity Advanced directives List of other physicians Hospitalizations, surgeries, and ER visits in previous 12 months Vitals Screenings to include cognitive, depression, and falls Referrals and appointments  In addition, I have reviewed and discussed with patient certain preventive protocols, quality metrics, and best practice recommendations. A written personalized care plan for preventive services as well as general preventive health recommendations were provided to patient.     Kandis Fantasia [place], [place]   04/23/8412   After Visit Summary: (Mail) Due to this being a telephonic visit, the after visit summary with patients  personalized plan was offered to patient via mail   Nurse Notes: No concerns at this time

## 2023-09-12 ENCOUNTER — Telehealth: Payer: Self-pay

## 2023-09-12 MED ORDER — "BD SYRINGE/NEEDLE 25G X 5/8"" 3 ML MISC": 6 refills | Status: DC

## 2023-09-12 NOTE — Telephone Encounter (Signed)
Needle Escribed. I also called her to make F/U appointment with me for DM as she is due for an appointment. HIPAA compliant callback message left.  Please help her get an appointment with me soon when she calls. Thanks.

## 2023-09-12 NOTE — Telephone Encounter (Signed)
Patient calls nurse line regarding prescription for insulin syringe/needles.   She states that there is an issue with her insurance coverage.   Called pharmacy. Pharmacist states that insurance does not cover relion syringes. Insurance prefers BD brand.   Please send new prescription for BD brand syringe-needles with instructions to use as directed three times daily.   Veronda Prude, RN

## 2023-09-18 MED ORDER — "INSULIN SYRINGE-NEEDLE U-100 31G X 5/16"" 0.3 ML MISC": 6 refills | Status: AC

## 2023-09-18 NOTE — Telephone Encounter (Signed)
Patient LVM on nurse line regarding insulin syringe needles. She states that the needles from the updated prescription are too long.   She is requesting that needle size stay the same as before, with new brand name that insurance will cover.   Please send in new prescription for shorter needles.   Thanks.   Veronda Prude, RN

## 2023-09-18 NOTE — Addendum Note (Signed)
Addended by: Janit Pagan T on: 09/18/2023 05:08 PM   Modules accepted: Orders

## 2023-09-18 NOTE — Telephone Encounter (Addendum)
I called pharmacy to cancel the initial prescription. Spoke with Ladona Ridgel who confirmed this is not currently on he medication list. New script sent in. Please notify patient

## 2023-10-01 ENCOUNTER — Other Ambulatory Visit: Payer: Self-pay | Admitting: Family Medicine

## 2023-10-21 ENCOUNTER — Ambulatory Visit: Payer: Self-pay

## 2023-10-21 NOTE — Patient Instructions (Signed)
Visit Information  Thank you for taking time to visit with me today. Please don't hesitate to contact me if I can be of assistance to you.   Following are the goals we discussed today:   Goals Addressed             This Visit's Progress    I continue to monitor my blood sugars and blood pressure.       Patient Goals/Self Care Activities: -Patient/Caregiver will self-administer medications as prescribed as evidenced by self-report/primary caregiver report  --Patient/Caregiver will call pharmacy for medication refills as evidenced by patient report and review of pharmacy fill history as appropriate -Patient/Caregiver will call provider office for new concerns or questions as evidenced by review of documented incoming telephone call notes and patient report -Patient/Caregiver will focus on medication adherence by taking medications as prescribed  -Calls provider office for new concerns, questions, or BP outside discussed parameters -Checks BP and records as discussed -Follows a low sodium diet/DASH diet -check blood sugar at prescribed times -continue your regiment Lab Results  Component Value Date   HGBA1C 6.9 02/28/2023   BP Readings from Last 3 Encounters:  08/13/23 (!) 158/64  03/18/23 (!) 158/68  02/28/23 (!) 134/52              Our next appointment is by telephone on 12/03/23   at 9 am  Please call the care guide team at [phone] if you need to cancel or reschedule your appointment.   If you are experiencing a Mental Health or Behavioral Health Crisis or need someone to talk to, please call 1-800-273-TALK (toll free, 24 hour hotline)  Patient verbalizes understanding of instructions and care plan provided today and agrees to view in MyChart. Active MyChart status and patient understanding of how to access instructions and care plan via MyChart confirmed with patient.     Juanell Fairly RN, BSN, [facility]  [facility], [facility]   Care Coordinator Phone: [phone]

## 2023-10-21 NOTE — Patient Outreach (Signed)
Care Coordination   Follow Up Visit Note   10/21/2023 Name: [name] MRN: [id number] DOB: Feb 03, 1941  [name] is a 82 y.o. year old female who sees Doreene Eland, MD for primary care. I spoke with  [name] by phone today.  What matters to the patients health and wellness today?  In conversation with Mrs. [name], she said she is doing fine. She no longer works at [employer] and spends more time with her friends. She still gets up early in the morning, watches her food and fluid intake, and takes her medicines as prescribed. Her blood pressure has been around 125 / 60, and her lowest blood sugar has been 98. She continues to stay active by going out and walking around the mall at least twice a week.     Goals Addressed             This Visit's Progress    I continue to monitor my blood sugars and blood pressure.       Patient Goals/Self Care Activities: -Patient/Caregiver will self-administer medications as prescribed as evidenced by self-report/primary caregiver report  --Patient/Caregiver will call pharmacy for medication refills as evidenced by patient report and review of pharmacy fill history as appropriate -Patient/Caregiver will call provider office for new concerns or questions as evidenced by review of documented incoming telephone call notes and patient report -Patient/Caregiver will focus on medication adherence by taking medications as prescribed  -Calls provider office for new concerns, questions, or BP outside discussed parameters -Checks BP and records as discussed -Follows a low sodium diet/DASH diet -check blood sugar at prescribed times -continue your regiment Lab Results  Component Value Date   HGBA1C 6.9 02/28/2023   BP Readings from Last 3 Encounters:  08/13/23 (!) 158/64  03/18/23 (!) 158/68  02/28/23 (!) 134/52              SDOH assessments and interventions completed:  No     Care Coordination  Interventions:  Yes, provided   Interventions Today    Flowsheet Row Most Recent Value  Chronic Disease   Chronic disease during today's visit Diabetes, Hypertension (HTN)  General Interventions   General Interventions Discussed/Reviewed General Interventions Discussed, General Interventions Reviewed  Nutrition Interventions   Nutrition Discussed/Reviewed Nutrition Discussed, Fluid intake  Pharmacy Interventions   Pharmacy Dicussed/Reviewed Pharmacy Topics Discussed  Safety Interventions   Safety Discussed/Reviewed Safety Discussed        Follow up plan: Follow up call scheduled for 12/03/23  9 am    Encounter Outcome:  Patient Visit Completed   Juanell Fairly RN, BSN, [facility]  [facility], [facility]  Care Coordinator Phone: [phone]

## 2023-11-07 ENCOUNTER — Other Ambulatory Visit: Payer: Self-pay | Admitting: Family Medicine

## 2023-11-25 ENCOUNTER — Other Ambulatory Visit: Payer: Self-pay | Admitting: Family Medicine

## 2023-11-29 ENCOUNTER — Other Ambulatory Visit: Payer: Self-pay | Admitting: Family Medicine

## 2023-12-01 ENCOUNTER — Telehealth: Payer: Self-pay | Admitting: Family Medicine

## 2023-12-01 NOTE — Telephone Encounter (Signed)
DM to follow up for DM management.  She also c/o right ear discomfort. Appointment made for her to see me tomorrow.

## 2023-12-02 ENCOUNTER — Ambulatory Visit: Payer: Medicare PPO | Admitting: Family Medicine

## 2023-12-02 ENCOUNTER — Encounter: Payer: Self-pay | Admitting: Family Medicine

## 2023-12-02 VITALS — BP 130/60 | HR 72 | Ht 62.5 in | Wt 138.8 lb

## 2023-12-02 DIAGNOSIS — E1121 Type 2 diabetes mellitus with diabetic nephropathy: Secondary | ICD-10-CM

## 2023-12-02 DIAGNOSIS — N1832 Chronic kidney disease, stage 3b: Secondary | ICD-10-CM | POA: Diagnosis not present

## 2023-12-02 DIAGNOSIS — D696 Thrombocytopenia, unspecified: Secondary | ICD-10-CM | POA: Diagnosis not present

## 2023-12-02 DIAGNOSIS — H6992 Unspecified Eustachian tube disorder, left ear: Secondary | ICD-10-CM | POA: Diagnosis not present

## 2023-12-02 DIAGNOSIS — I700 Atherosclerosis of aorta: Secondary | ICD-10-CM

## 2023-12-02 LAB — POCT GLYCOSYLATED HEMOGLOBIN (HGB A1C): HbA1c, POC (controlled diabetic range): 7.1 % — AB (ref 0.0–7.0)

## 2023-12-02 MED ORDER — FLUTICASONE PROPIONATE 50 MCG/ACT NA SUSP: 2.0000 | Freq: Every day | NASAL | 1 refills | Status: AC

## 2023-12-02 MED ORDER — CETIRIZINE HCL 5 MG PO TABS: 5.0000 mg | ORAL_TABLET | Freq: Every day | ORAL | 1 refills | Status: AC

## 2023-12-02 NOTE — Assessment & Plan Note (Signed)
No acute bleed May be medication induced - ASA, Lipitor, Coreg or Zestoretic As discussed, we will repeat lab at f/u Consider addressing this medications for adjustment if she becomes symptomatic Monitor for now

## 2023-12-02 NOTE — Progress Notes (Signed)
    SUBJECTIVE:   CHIEF COMPLAINT / HPI:   HPI DM/CKD/Atherosclerosis:  She is here for follow-up. She is compliant with Metformin XR 500 mg QD, Jardiance 25 mg QD, and Lantus 5 units QD. Her home CBGs range from  106, 116, and 92 (lowest). She is on her ASA and Lipitor 20 mg every day for atherosclerosis  Thrombocytopenia: No bruising or bleeding. Feels well otherwise.  Left ear discomfort: She c/o fluttering sensation in her left ear, like fluid rushing through the back of her ear. This started 5 days ago. She has had similar issues in the past that lasted a few days after the use of Flonase. She has not used anything for her current symptoms. No ear discharge, pain, or fever.  PERTINENT  PMH / PSH: PMHx reviewed  OBJECTIVE:   BP 130/60   Pulse 72   Ht 5' 2.5" (1.588 m)   Wt 138 lb 12.8 oz (63 kg)   LMP 11/24/1992   SpO2 99%   BMI 24.98 kg/m   Physical Exam Vitals and nursing note reviewed.  Constitutional:      Appearance: Normal appearance.  HENT:     Head: Normocephalic and atraumatic.     Right Ear: Tympanic membrane, ear canal and external ear normal. There is no impacted cerumen.     Left Ear: Ear canal and external ear normal. There is no impacted cerumen.     Ears:     Comments: Very mild cloudiness behind her left TM. No inflammation or erythema or discharge. No ear canal tenderness B/L Eyes:     Conjunctiva/sclera: Conjunctivae normal.     Pupils: Pupils are equal, round, and reactive to light.  Cardiovascular:     Rate and Rhythm: Normal rate and regular rhythm.     Heart sounds: Normal heart sounds. No murmur heard. Pulmonary:     Effort: Pulmonary effort is normal. No respiratory distress.     Breath sounds: Normal breath sounds. No wheezing.  Neurological:     General: No focal deficit present.     Mental Status: She is alert.      ASSESSMENT/PLAN:   Diabetes mellitus type 2, controlled (HCC) A1C increased to 7.1 but still at goal of  <7.5 Continue current regimen with lifestyle modification Urine microalbumin ordered Recheck A1C in 3-4 months  Stage 3b chronic kidney disease (CKD) (HCC) Continue Jardiance Bmet in 2-3 months   Thrombocytopenia (HCC) No acute bleed May be medication induced - ASA, Lipitor, Coreg or Zestoretic As discussed, we will repeat lab at f/u Consider addressing this medications for adjustment if she becomes symptomatic Monitor for now  Aortic arch atherosclerosis (HCC) On ASA and Statins  Eustachian tube dysfunction, left May be due to allergy Good response to Flonase in the past I recommended and escribed Flonase and Zyrtec F/U soon if there is no improvement She agreed with the plan   Declined flu , Covid , and Shingrix shots  Janit Pagan, MD [facility]

## 2023-12-02 NOTE — Assessment & Plan Note (Addendum)
A1C increased to 7.1 but still at goal of <7.5 Continue current regimen with lifestyle modification Urine microalbumin ordered Recheck A1C in 3-4 months

## 2023-12-02 NOTE — Assessment & Plan Note (Signed)
On ASA and Statins

## 2023-12-02 NOTE — Patient Instructions (Signed)
It was nice seeing you today. You do have small fluid in your left ear. This could be due to allergy. I have sent in some allergy medications to your pharmacy as discussed. Please try this and let me know if there is any improvement. Also your A1C increased to 7.1 from 6.9 - We will continue same medication for now and I will like to see you back in 3-4 months.

## 2023-12-02 NOTE — Assessment & Plan Note (Signed)
Continue Jardiance Bmet in 2-3 months

## 2023-12-02 NOTE — Assessment & Plan Note (Signed)
May be due to allergy Good response to Flonase in the past I recommended and escribed Flonase and Zyrtec F/U soon if there is no improvement She agreed with the plan

## 2023-12-03 ENCOUNTER — Ambulatory Visit: Payer: Self-pay

## 2023-12-03 NOTE — Patient Outreach (Addendum)
  Care Coordination   Follow Up Visit Note   12/03/2023 Name: [name] MRN: [id number] DOB: 23-Jul-1941  [name] is a 82 y.o. year old female who sees Michele Eland, MD for primary care. I spoke with  [name] by phone today.  What matters to the patients health and wellness today?  I had a conversation with [name] today regarding her recent medical evaluation. She attended her appointment with [name] on December 10th. During this visit, her blood pressure was measured at 130/60, and her A1C level was recorded at 7.1. Although this represents a slight increase, it remains within the acceptable target of less than 7.5. Also, it was observed that she has a minor accumulation of fluid in her ear, for which medication has been prescribed. [name] reported that she     monitors her blood sugar and blood pressure on a daily basis and adheres to her prescribed medication regimen.   Goals Addressed             This Visit's Progress    I continue to monitor my blood sugars and blood pressure.       Patient Goals/Self Care Activities: -Patient/Caregiver will self-administer medications as prescribed as evidenced by self-report/primary caregiver report  --Patient/Caregiver will call pharmacy for medication refills as evidenced by patient report and review of pharmacy fill history as appropriate -Patient/Caregiver will call provider office for new concerns or questions as evidenced by review of documented incoming telephone call notes and patient report -Patient/Caregiver will focus on medication adherence by taking medications as prescribed  -Calls provider office for new concerns, questions, or BP outside discussed parameters -Checks BP and records as discussed -Follows a low sodium diet/DASH diet -check blood sugar at prescribed times -continue your regiment Lab Results  Component Value Date   HGBA1C 7.1 (A) 12/02/2023   BP Readings from Last 3  Encounters:  12/02/23 130/60  08/13/23 (!) 158/64  03/18/23 (!) 158/68              SDOH assessments and interventions completed:  No     Care Coordination Interventions:  Yes, provided   Interventions Today    Flowsheet Row Most Recent Value  Chronic Disease   Chronic disease during today's visit Diabetes, Hypertension (HTN)  General Interventions   General Interventions Discussed/Reviewed General Interventions Discussed, General Interventions Reviewed  Nutrition Interventions   Nutrition Discussed/Reviewed Nutrition Discussed  Pharmacy Interventions   Pharmacy Dicussed/Reviewed Pharmacy Topics Discussed  Safety Interventions   Safety Discussed/Reviewed Safety Discussed        Follow up plan: Follow up call scheduled for 02/03/24  9 am    Encounter Outcome:  Patient Visit Completed   Juanell Fairly RN, BSN, [facility]  [facility], [facility]  Care Coordinator Phone: [phone]

## 2023-12-03 NOTE — Patient Instructions (Signed)
Visit Information  Thank you for taking time to visit with me today. Please don't hesitate to contact me if I can be of assistance to you.   Following are the goals we discussed today:   Goals Addressed             This Visit's Progress    I continue to monitor my blood sugars and blood pressure.       Patient Goals/Self Care Activities: -Patient/Caregiver will self-administer medications as prescribed as evidenced by self-report/primary caregiver report  --Patient/Caregiver will call pharmacy for medication refills as evidenced by patient report and review of pharmacy fill history as appropriate -Patient/Caregiver will call provider office for new concerns or questions as evidenced by review of documented incoming telephone call notes and patient report -Patient/Caregiver will focus on medication adherence by taking medications as prescribed  -Calls provider office for new concerns, questions, or BP outside discussed parameters -Checks BP and records as discussed -Follows a low sodium diet/DASH diet -check blood sugar at prescribed times -continue your regiment Lab Results  Component Value Date   HGBA1C 7.1 (A) 12/02/2023   BP Readings from Last 3 Encounters:  12/02/23 130/60  08/13/23 (!) 158/64  03/18/23 (!) 158/68              Our next appointment is by telephone on 02/03/24 at 9 am  Please call the care guide team at [phone] if you need to cancel or reschedule your appointment.   If you are experiencing a Mental Health or Behavioral Health Crisis or need someone to talk to, please call 1-800-273-TALK (toll free, 24 hour hotline)  Patient verbalizes understanding of instructions and care plan provided today and agrees to view in MyChart. Active MyChart status and patient understanding of how to access instructions and care plan via MyChart confirmed with patient.     Juanell Fairly RN, BSN, [facility] [place]  [facility], [facility]  Care  Coordinator Phone: [phone]

## 2023-12-04 LAB — MICROALBUMIN / CREATININE URINE RATIO
Creatinine, Urine: 96.6 mg/dL
Microalb/Creat Ratio: 32 mg/g{creat} — ABNORMAL HIGH (ref 0–29)
Microalbumin, Urine: 31.1 ug/mL

## 2023-12-05 ENCOUNTER — Telehealth: Payer: Self-pay | Admitting: Family Medicine

## 2023-12-05 NOTE — Telephone Encounter (Signed)
Patient returned my call. Result and plan discussed. She has nephrology appointment in Jan/Feb 2024 she says. Repeat uACR in 1 yr. She agreed with the plan.

## 2023-12-05 NOTE — Telephone Encounter (Signed)
HIPAA compliant callback message left  Please advise her that her urine protein was mildly elevated. This could be due to her diabetes and HTN. We will continue to work on BP and diabetes control to manage her urine protein.  No change in medication is required at this time.  She is already connected with a nephrologist. I will check in with her at future appointment regarding nephrology follow up for her  kidney functions.

## 2024-01-02 ENCOUNTER — Other Ambulatory Visit: Payer: Self-pay | Admitting: Family Medicine

## 2024-01-02 ENCOUNTER — Ambulatory Visit (INDEPENDENT_AMBULATORY_CARE_PROVIDER_SITE_OTHER): Payer: Medicare PPO | Admitting: Family Medicine

## 2024-01-02 ENCOUNTER — Encounter: Payer: Self-pay | Admitting: Family Medicine

## 2024-01-02 VITALS — BP 154/60 | HR 82 | Ht 62.5 in | Wt 137.4 lb

## 2024-01-02 DIAGNOSIS — N63 Unspecified lump in unspecified breast: Secondary | ICD-10-CM | POA: Insufficient documentation

## 2024-01-02 DIAGNOSIS — E1121 Type 2 diabetes mellitus with diabetic nephropathy: Secondary | ICD-10-CM

## 2024-01-02 DIAGNOSIS — E1169 Type 2 diabetes mellitus with other specified complication: Secondary | ICD-10-CM

## 2024-01-02 DIAGNOSIS — I10 Essential (primary) hypertension: Secondary | ICD-10-CM

## 2024-01-02 DIAGNOSIS — Z794 Long term (current) use of insulin: Secondary | ICD-10-CM

## 2024-01-02 DIAGNOSIS — N6310 Unspecified lump in the right breast, unspecified quadrant: Secondary | ICD-10-CM

## 2024-01-02 NOTE — Patient Instructions (Signed)
 Breast Self-Awareness Breast self-awareness is knowing how your breasts look and feel. You need to: Check your breasts on a regular basis. Tell your doctor about any changes. Become familiar with the look and feel of your breasts. This can help you catch a breast problem while it is still small and can be treated. You should do breast self-exams even if you have breast implants. What you need: A mirror. A well-lit room. A pillow or other soft object. How to do a breast self-exam Follow these steps to do a breast self-exam: Look for changes  Take off all the clothes above your waist. Stand in front of a mirror in a room with good lighting. Put your hands down at your sides. Compare your breasts in the mirror. Look for any difference between them, such as: A difference in shape. A difference in size. Wrinkles, dips, and bumps in one breast and not the other. Look at each breast for changes in the skin, such as: Redness. Scaly areas. Skin that has gotten thicker. Dimpling. Open sores (ulcers). Look for changes in your nipples, such as: Fluid coming out of a nipple. Fluid around a nipple. Bleeding. Dimpling. Redness. A nipple that looks pushed in (retracted), or that has changed position. Feel for changes Lie on your back. Feel each breast. To do this: Pick a breast to feel. Place a pillow under the shoulder closest to that breast. Put the arm closest to that breast behind your head. Feel the nipple area of that breast using the hand of your other arm. Feel the area with the pads of your three middle fingers by making small circles with your fingers. Use light, medium, and firm pressure. Continue the overlapping circles, moving downward over the breast. Keep making circles with your fingers. Stop when you feel your ribs. Start making circles with your fingers again, this time going upward until you reach your collarbone. Then, make circles outward across your breast and into your  armpit area. Squeeze your nipple. Check for discharge and lumps. Repeat these steps to check your other breast. Sit or stand in the tub or shower. With soapy water  on your skin, feel each breast the same way you did when you were lying down. Write down what you find Writing down what you find can help you remember what to tell your doctor. Write down: What is normal for each breast. Any changes you find in each breast. These include: The kind of changes you find. A tender or painful breast. Any lump you find. Write down its size and where it is. When you last had your monthly period (menstrual cycle). General tips If you are breastfeeding, the best time to check your breasts is after you feed your baby or after you use a breast pump. If you get monthly bleeding, the best time to check your breasts is 5-7 days after your monthly cycle ends. With time, you will become comfortable with the self-exam. You will also start to know if there are changes in your breasts. Contact a doctor if: You see a change in the shape or size of your breasts or nipples. You see a change in the skin of your breast or nipples, such as red or scaly skin. You have fluid coming from your nipples that is not normal. You find a new lump or thick area. You have breast pain. You have any concerns about your breast health. Summary Breast self-awareness includes looking for changes in your breasts and feeling for changes  within your breasts. You should do breast self-awareness in front of a mirror in a well-lit room. If you get monthly periods (menstrual cycles), the best time to check your breasts is 5-7 days after your period ends. Tell your doctor about any changes you see in your breasts. Changes include changes in size, changes on the skin, painful or tender breasts, or fluid from your nipples that is not normal. This information is not intended to replace advice given to you by your health care provider. Make sure  you discuss any questions you have with your health care provider. Document Revised: 05/16/2022 Document Reviewed: 10/11/2021 Elsevier Patient Education  2024 [employer].

## 2024-01-02 NOTE — Assessment & Plan Note (Signed)
 Likely benign fibrous tissue Given her age and PMHx, plan to obtain diagnostic mammogram Order placed and I provided appointment slip to her She will call for her appointment today

## 2024-01-02 NOTE — Assessment & Plan Note (Signed)
 Her BP is above goal Given normal home BP per the patient, I recommended ambulatory BP monitoring I referred her to the pharmacy clinic for ambulatory BP monitoring Appointment made Plan to adjust BP meds based on this evaluation She agreed with the plan

## 2024-01-02 NOTE — Assessment & Plan Note (Signed)
 No change in management

## 2024-01-02 NOTE — Progress Notes (Signed)
    SUBJECTIVE:   CHIEF COMPLAINT / HPI:   Breast Lump: C/O right breast lump x 2 days. She denies any pain or redness and no nipple discharge. No trauma to her breast.  HTN/DM2: She endorsed compliance with Coreg  25 mg BID, Indur 90 mg QD and Zestoretic  20/12.5 mg 1 tab QD. Last dose was a few hrs ago. She stated that her home systolic BP runs in the 140s. Denies any cardiopulmonary or neurologic symptoms.  She is compliant with her DM meds. No concerns.  PERTINENT  PMH / PSH: PMhx reviewed  OBJECTIVE:   BP (!) 154/60   Pulse 82   Ht 5' 2.5 (1.588 m)   Wt 137 lb 6.4 oz (62.3 kg)   LMP 11/24/1992   SpO2 97%   BMI 24.73 kg/m   Physical Exam Vitals and nursing note reviewed. Exam conducted with a chaperone present Lossie Meissner).  Cardiovascular:     Rate and Rhythm: Normal rate and regular rhythm.     Heart sounds: Normal heart sounds. No murmur heard. Pulmonary:     Effort: Pulmonary effort is normal. No respiratory distress.     Breath sounds: Normal breath sounds. No wheezing.  Chest:  Breasts:    Breasts are symmetrical.     Right: No swelling, nipple discharge or tenderness.     Left: No swelling, nipple discharge or tenderness.       Comments: Incision scar on the outer border of her left breast Soft, fibrous tissue on the outer border of her right breast Musculoskeletal:     Comments: Sensory exam of the foot is normal, tested with the monofilament. Good pulses, no lesions or ulcers, good peripheral pulses.       ASSESSMENT/PLAN:   Breast lump Likely benign fibrous tissue Given her age and PMHx, plan to obtain diagnostic mammogram Order placed and I provided appointment slip to her She will call for her appointment today  Diabetes mellitus type 2, controlled (HCC) No change in management  Essential hypertension Her BP is above goal Given normal home BP per the patient, I recommended ambulatory BP monitoring I referred her to the pharmacy clinic  for ambulatory BP monitoring Appointment made Plan to adjust BP meds based on this evaluation She agreed with the plan     Otto Fairly, MD [facility]

## 2024-01-08 ENCOUNTER — Ambulatory Visit: Payer: Medicare PPO | Admitting: Pharmacist

## 2024-01-08 ENCOUNTER — Encounter: Payer: Self-pay | Admitting: Pharmacist

## 2024-01-08 VITALS — BP 115/38 | HR 68 | Wt 137.0 lb

## 2024-01-08 DIAGNOSIS — I10 Essential (primary) hypertension: Secondary | ICD-10-CM | POA: Diagnosis not present

## 2024-01-08 NOTE — Patient Instructions (Signed)
Blood Pressure Activity Diary Time Lying down/ Sleeping Walking/ Exercise Stressed/ Angry Headache/ Pain Dizzy  9 AM       10 AM       11 AM       12 PM       1 PM       2 PM       Time Lying down/ Sleeping Walking/ Exercise Stressed/ Angry Headache/ Pain Dizzy  3 PM       4 PM        5 PM       6 PM       7 PM       8 PM       Time Lying down/ Sleeping Walking/ Exercise Stressed/ Angry Headache/ Pain Dizzy  9 PM       10 PM       11 PM       12 AM       1 AM       2 AM       3 AM       Time Lying down/ Sleeping Walking/ Exercise Stressed/ Angry Headache/ Pain Dizzy  4 AM       5 AM       6 AM       7 AM       8 AM       9 AM       10 AM        Time you woke up: _________                  Time you went to sleep:__________  Come back tomorrow at 9:00AM to have the monitor removed  Call the [facility] if you have any questions before then [phone])  Wearing the Blood Pressure Monitor The cuff will inflate every 20 minutes during the day and every 30 minutes while you sleep. Fill out the blood pressure-activity diary during the day, especially during activities that may affect your reading -- such as exercise, stress, walking, taking your blood pressure medications  Important things to know: Avoid taking the monitor off for the next 24 hours, unless it causes you discomfort or pain. Do NOT get the monitor wet and do NOT try to clean the monitor with any cleaning products. Do NOT put the monitor on anyone else's arm. When the cuff inflates, avoid excess movement. Let the cuffed arm hang loosely, slightly away from the body. Avoid flexing the muscles or moving the hand/fingers. Remember to fill out the blood pressure activity diary. If you experience severe pain or unusual pain (not associated with getting your blood pressure checked), remove the monitor.  Troubleshooting:  Code  Troubleshooting   1  Check cuff position, tighten cuff   2, 3  Remain  still during reading   4, 87  Check air hose connections and make sure cuff is tight   85, 89  Check hose connections and make tubing is not crimped   86  Push START/STOP to restart reading   88, 91  Retry by pushing START/STOP   90  Replace batteries. If problem persists, remove monitor and bring back to   clinic at follow up   97, 98, 99  Service required - Remove monitor and bring back to clinic at follow up

## 2024-01-08 NOTE — Assessment & Plan Note (Signed)
History of isolated systolic hypertension and longstanding ~40 years hypertension diagnosis.  Currently taking Coreg 25 mg twice daily, Imdur 90 mg daily, and Zestoretic 20-12.5 mg daily with goal presssure of <135 mmHg systolic with elevated systolic readings greater than goal in office and at home.      -Placed blood pressure cuff, provided education, patient instructed to wear cuff for 24 hours and return tomorrow to review results.

## 2024-01-08 NOTE — Progress Notes (Signed)
   S:     Chief Complaint  Patient presents with   Medication Management    Amb BP Monitoring - Day 1   83 y.o. female who presents for hypertension evaluation, education, and management. Patient arrives in good spirits and presents without any assistance. Patient reports home BP readings typically range from 140-150s SBP checking usually every morning and sometimes afternoons.   PMH is significant for CKD3, CAD.   Patient was referred and last seen by Primary Care Provider, Dr. Lum Babe, on 01/02/24.   Diagnosed with Hypertension in her 40s.  Previous Amb BP monitor evaluation in 2019 demonstrated ISH with Awake readings 147/65 mm Hg average.   Medication adherence is reported to be good, misses dose occassionally.   Discussed procedure for wearing the monitor and gave patient written instructions. Monitor was placed on non-dominant arm with instructions to return in the morning.   Current BP Medications include:  carvedilol 25 mg twice daily, isosorbide mononitrate 90 mg (1 and 1/2 tab) once daily, lisinopril-hydrochlorothiazide 20-12.5 mg once daily  Antihypertensives tried in the past include: None   O:  ROS  Physical Exam  Last 3 Office BP readings: BP Readings from Last 3 Encounters:  01/02/24 (!) 154/60  12/02/23 130/60  08/13/23 (!) 158/64    Clinical Atherosclerotic Cardiovascular Disease (ASCVD): Yes  The ASCVD Risk score (Arnett DK, et al., 2019) failed to calculate for the following reasons:   The 2019 ASCVD risk score is only valid for ages 42 to 78   Risk score cannot be calculated because patient has a medical history suggesting prior/existing ASCVD  Basic Metabolic Panel    Component Value Date/Time   NA 143 08/11/2023 0917   NA 140 11/20/2021 0856   K 4.1 08/11/2023 0917   CL 105 08/11/2023 0917   CO2 31 08/11/2023 0917   GLUCOSE 178 (H) 08/11/2023 0917   BUN 25 (H) 08/11/2023 0917   BUN 18 11/20/2021 0856   CREATININE 1.28 (H) 08/11/2023 0917    CREATININE 0.84 08/06/2016 1135   CALCIUM 10.0 08/11/2023 0917   CALCIUM 9.8 04/18/2020 1100   GFRNONAA 42 (L) 08/11/2023 0917   GFRNONAA 69 08/06/2016 1135   GFRAA 55 (L) 08/07/2020 1034   GFRAA 80 08/06/2016 1135    Renal function: CrCl cannot be calculated (Patient's most recent lab result is older than the maximum 21 days allowed.).   ABPM Study Data: Arm Placement left arm  For Office Goal BP of <140 mmHg Systolic - lower if tolerated:  ABPM thresholds: Overall Systolic BP <130 mmHg, daytime BP <135 mmHg, sleeptime BP <120 mmHg    A/P: History of isolated systolic hypertension and longstanding ~40 years hypertension diagnosis.  Currently taking Coreg 25 mg twice daily, Imdur 90 mg daily, and Zestoretic 20-12.5 mg daily with goal presssure of <135 mmHg systolic with elevated systolic readings greater than goal in office and at home.      -Placed blood pressure cuff, provided education, patient instructed to wear cuff for 24 hours and return tomorrow to review results.   Written patient instructions provided including activity/symptom/event log. Patient verbalized understanding of plan. Total time in face to face counseling 23 minutes.    Follow-up: Tomorrow AM - early morning appointment 9:00AM Patient seen with Lavona Mound, PharmD Candidate and Laqueta Jean, PharmD Candidate.

## 2024-01-09 ENCOUNTER — Encounter: Payer: Self-pay | Admitting: Pharmacist

## 2024-01-09 ENCOUNTER — Ambulatory Visit (INDEPENDENT_AMBULATORY_CARE_PROVIDER_SITE_OTHER): Payer: Medicare PPO | Admitting: Pharmacist

## 2024-01-09 VITALS — BP 127/56

## 2024-01-09 DIAGNOSIS — I10 Essential (primary) hypertension: Secondary | ICD-10-CM

## 2024-01-09 NOTE — Assessment & Plan Note (Signed)
History of hypertension longstanding currently taking three medications;  carvedilol, lisinopril, hydrochlorothiazide and denies any dizziness or orthostatic changes at any time of the day. Tolerating all medications.  Goal presssure of <130 mm Hg.  Found to have isolated systolic hypertension which is controlled using 24-hour ambulatory blood pressure evaluation which demonstrates an average AWAKE blood pressure of 127/56 mmHg.  Nocturnal dipping pattern is <10% which is abnormal.  Changes to medications:  None

## 2024-01-09 NOTE — Progress Notes (Signed)
   S:     Chief Complaint  Patient presents with   Medication Management    Amb BP Monitor Day #2   83 y.o. female who presents for hypertension evaluation, education, and management.  Patient arrives in good spirits and presents without any assistance.   Patient returns to clinic with 24 hour blood pressure monitor and reports no issues with monitor and uneventful day. Patient reports they were able to wear the Ambulatory Blood Pressure Cuff for the entire 24 evaluation period.   O:  Review of Systems  All other systems reviewed and are negative.   Physical Exam Constitutional:      Appearance: Normal appearance.  Pulmonary:     Effort: Pulmonary effort is normal.  Neurological:     Mental Status: She is alert. Mental status is at baseline.  Psychiatric:        Mood and Affect: Mood normal.        Behavior: Behavior normal.        Thought Content: Thought content normal.        Judgment: Judgment normal.     Last 3 Office BP readings: BP Readings from Last 3 Encounters:  01/09/24 (!) 127/56  01/08/24 (!) 115/38  01/02/24 (!) 154/60    Basic Metabolic Panel    Component Value Date/Time   NA 143 08/11/2023 0917   NA 140 11/20/2021 0856   K 4.1 08/11/2023 0917   CL 105 08/11/2023 0917   CO2 31 08/11/2023 0917   GLUCOSE 178 (H) 08/11/2023 0917   BUN 25 (H) 08/11/2023 0917   BUN 18 11/20/2021 0856   CREATININE 1.28 (H) 08/11/2023 0917   CREATININE 0.84 08/06/2016 1135   CALCIUM 10.0 08/11/2023 0917   CALCIUM 9.8 04/18/2020 1100   GFRNONAA 42 (L) 08/11/2023 0917   GFRNONAA 69 08/06/2016 1135   GFRAA 55 (L) 08/07/2020 1034   GFRAA 80 08/06/2016 1135    ABPM Study Data: Arm Placement left arm  Overall Mean 24hr BP:   124/56 mmHg  HR: 68  Daytime Mean BP:  127/56 mmHg  HR: 71  Nighttime Mean BP:  119/55 mmHg  HR: 62  Dipping Pattern: No.  Sys:   6.1%   Dia: 2.1%   [normal dipping ~10-20%]  For Office Goal BP of <140 mmHg Systolic - lower if tolerated:   ABPM thresholds: Overall Systolic BP <130 mmHg, daytime BP <135 mmHg, sleeptime BP <120 mmHg    A/P: History of hypertension longstanding currently taking three medications;  carvedilol, lisinopril, hydrochlorothiazide and denies any dizziness or orthostatic changes at any time of the day. Tolerating all medications.  Goal presssure of <130 mm Hg.  Found to have isolated systolic hypertension which is controlled using 24-hour ambulatory blood pressure evaluation which demonstrates an average AWAKE blood pressure of 127/56 mmHg.  Nocturnal dipping pattern is <10% which is abnormal.  Changes to medications:  None  Results reviewed and written information provided.    Written patient instructions provided. Patient verbalized understanding of treatment plan.  Total time in face to face counseling 12 minutes.    Follow-up:  Pharmacist none planned.  PCP clinic visit in PRN

## 2024-01-09 NOTE — Patient Instructions (Signed)
It was nice to see you today!  Thank you for completing the blood pressure monitoring evaluation.  Your goal blood pressure is < 130/80 mmHg  Please let us know if you have dizziness in the future.   Medication Changes:  NONE! Continue all other medication the same.

## 2024-01-12 NOTE — Progress Notes (Signed)
Reviewed and agree with Dr Koval's plan.   

## 2024-01-17 ENCOUNTER — Other Ambulatory Visit: Payer: Self-pay | Admitting: Family Medicine

## 2024-01-23 ENCOUNTER — Encounter: Payer: Self-pay | Admitting: Family Medicine

## 2024-01-23 ENCOUNTER — Ambulatory Visit
Admission: RE | Admit: 2024-01-23 | Discharge: 2024-01-23 | Disposition: A | Discharge status: Home / Self Care | Payer: Medicare PPO | Source: Ambulatory Visit | Attending: Family Medicine | Admitting: Family Medicine

## 2024-01-23 ENCOUNTER — Ambulatory Visit: Payer: Medicare PPO

## 2024-01-23 DIAGNOSIS — N6311 Unspecified lump in the right breast, upper outer quadrant: Secondary | ICD-10-CM | POA: Diagnosis not present

## 2024-01-23 DIAGNOSIS — N6310 Unspecified lump in the right breast, unspecified quadrant: Secondary | ICD-10-CM

## 2024-01-23 DIAGNOSIS — N6313 Unspecified lump in the right breast, lower outer quadrant: Secondary | ICD-10-CM | POA: Diagnosis not present

## 2024-01-26 ENCOUNTER — Telehealth: Payer: Self-pay | Admitting: *Deleted

## 2024-01-26 NOTE — Progress Notes (Unsigned)
Complex Care Management Care Guide Note  01/26/2024 Name: [name] MRN: [id number] DOB: 12-25-1940  [name] is a 83 y.o. year old female who is a primary care patient of Doreene Eland, MD and is actively engaged with the care management team. I reached out to [name] by phone today to assist with re-scheduling  with the RN Case Manager.  Follow up plan: Unsuccessful telephone outreach attempt made. A HIPAA compliant phone message was left for the patient providing contact information and requesting a return call.  Gwenevere Ghazi  [facility]  [facility], [facility] Guide  Direct Dial: [phone]  Fax [phone]

## 2024-01-27 NOTE — Progress Notes (Signed)
 Complex Care Management Care Guide Note  01/27/2024 Name: RUARI DUGGAN MRN: [id number] DOB: [birth year]  LESIA MONICA is a 83 y.o. year old female who is a primary care patient of Anders Otto DASEN, MD and is actively engaged with the care management team. I reached out to Lashaun H Brouse by phone today to assist with re-scheduling  with the RN Case Manager.  Follow up plan: Telephone appointment with complex care management team member scheduled for:  2/12  Harlene Satterfield  [facility]  [facility], [facility] Guide  Direct Dial: [phone]  Fax [phone]

## 2024-01-29 ENCOUNTER — Other Ambulatory Visit: Payer: Self-pay | Admitting: Family Medicine

## 2024-02-04 ENCOUNTER — Encounter: Payer: Self-pay | Admitting: *Deleted

## 2024-02-04 ENCOUNTER — Other Ambulatory Visit: Payer: Self-pay | Admitting: *Deleted

## 2024-02-04 NOTE — Patient Outreach (Signed)
Care Management   Visit Note  02/04/2024 Name: [name] MRN: [id number] DOB: February 13, 1941  Subjective: [name] is a 83 y.o. year old female who is a primary care patient of Doreene Eland, MD. The Care Management team was consulted for assistance.      Engaged with patient spoke with patient by telephone.    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   RNCM Clinical Goal(s):  Patient will verbalize basic understanding of  HTN and DMII disease process and self health management plan as evidenced by verbal explanation, recognizing symptoms, lifestyle modifications attend all scheduled medical appointments: with primary care provider and specialists as evidenced by keeping all scheduled appointments demonstrate Improved and Ongoing adherence to prescribed treatment plan for HTN and DMII as evidenced by consistent medication compliance, symptom monitoring, continued lifestyle changes continue to work with RN Care Manager to address care management and care coordination needs related to  HTN and DMII as evidenced by adherence to CM Team Scheduled appointments work with pharmacist to address disease state management related toHTN as evidenced by review or EMR and patient or pharmacist report through collaboration with Medical illustrator, provider, and care team.   Interventions: Evaluation of current treatment plan related to  self management and patient's adherence to plan as established by provider   Diabetes Interventions:  (Status:  Goal on track:  Yes.) Long Term Goal Assessed patient's understanding of A1c goal:  <7.5% Provided education to patient about basic DM disease process. Reviewed A1C goal with patient. Currently below goal at this time. Checks blood  sugar daily and keeps log. Denies any acute changes at this time. Reviewed medications with patient and discussed importance of medication adherence. Reports compliance with all medications Counseled on importance of regular laboratory monitoring as prescribed. Labs up to date at this time. Discussed plans with patient for ongoing care management follow up and provided patient with direct contact information for care management team Provided patient with written educational materials related to hypo and hyperglycemia and importance of correct treatment. Denies any recent hypo/hyperglycemic events Reviewed scheduled/upcoming provider appointments including: 03-31-24 Cardiology, 08-12-24 Oncology Advised patient, providing education and rationale, to check cbg at minimum fasting daily and record, calling provider for findings outside established parameters. RNCM reviewed with patient goal fasting <130 and post prandial <180. Review of patient status, including review of consultants reports, relevant laboratory and other test results, and medications completed Screening for signs and symptoms of depression related to chronic disease state  Assessed social determinant of health barriers RNCM provided number to [facility] to schedule annual vision screening. Denies any changes with her skin integrity to bilateral legs/feet. Lab Results  Component Value Date   HGBA1C 7.1 (A) 12/02/2023    Hypertension Interventions:  (Status:  Goal on track:  Yes.) Long Term Goal Last practice recorded BP readings:  BP Readings from Last 3 Encounters:  01/09/24 (!) 127/56  01/08/24 (!) 115/38  01/02/24 (!) 154/60   Most recent eGFR/CrCl:  Lab Results  Component Value Date   EGFR 50 (L) 11/20/2021    No components found for: "CRCL"  Evaluation of current treatment plan related to hypertension self management and patient's adherence to  plan as established by provider. Patient knowledgeable about  guidelines set for her BP. RNCM reinforced goal for SBP<130 DBP<80. Denies any swelling, chest pain, dizziness, or headaches at this time. Provided education to patient re: stroke prevention, s/s of heart attack and stroke. Education and support provided Reviewed medications with patient and discussed importance of compliance. Reports compliance with all medications Counseled on adverse effects of illicit drug and excessive alcohol use in patients with high blood pressure. Denies any illicut drug or excessive alcohol use. Counseled on the importance of exercise goals with target of 150 minutes per week Discussed plans with patient for ongoing care management follow up and provided patient with direct contact information for care management team Advised patient, providing education and rationale, to monitor blood pressure daily and record, calling PCP for findings outside established parameters. RNCM provided reinforced education on BP staying within <130/<90 and to keep log of all readings. Reviewed scheduled/upcoming provider appointments including: 03-31-24 Cardiology Advised patient to discuss elevated BP readings, dizziness or new changes with provider Provided education on prescribed diet ADA/Heart Healthy. Reports she does not have the best appetite at times but she knows that she has to eat a balanced diet. No concerns at this time noted by patient. Discussed complications of poorly controlled blood pressure such as heart disease, stroke, circulatory complications, vision complications, kidney impairment, sexual dysfunction Screening for signs and symptoms of depression related to chronic disease state  Assessed social determinant of health barriers  Patient Goals/Self-Care Activities: Take all medications as prescribed Attend all scheduled provider appointments Call pharmacy for medication refills 3-7 days in advance of running out of medications Attend church or other social  activities Perform all self care activities independently  Perform IADL's (shopping, preparing meals, housekeeping, managing finances) independently Call provider office for new concerns or questions  schedule appointment with eye doctor check blood sugar at prescribed times: once daily and when you have symptoms of low or high blood sugar check feet daily for cuts, sores or redness enter blood sugar readings and medication or insulin into daily log fill half of plate with vegetables keep feet up while sitting wear comfortable, cotton socks wear comfortable, well-fitting shoes check blood pressure daily write blood pressure results in a log or diary call doctor for signs and symptoms of high blood pressure report new symptoms to your doctor  Follow Up Plan:  Telephone follow up appointment with care management team member scheduled for:  03-08-2024 at 9:00 am           Consent to Services:  Patient was given information about care management services, agreed to services, and gave verbal consent to participate.   Plan: Telephone follow up appointment with care management team member scheduled for:03-08-2024 at 9:00 am  Larey Brick, BSN RN [facility], [facility] RN Care Manager Direct Dial: [phone]  Fax: [phone]

## 2024-02-04 NOTE — Patient Instructions (Signed)
Visit Information  Thank you for taking time to visit with me today. Please don't hesitate to contact me if I can be of assistance to you before our next scheduled telephone appointment.  Following are the goals we discussed today:   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   RNCM Clinical Goal(s):  Patient will verbalize basic understanding of  HTN and DMII disease process and self health management plan as evidenced by verbal explanation, recognizing symptoms, lifestyle modifications attend all scheduled medical appointments: with primary care provider and specialists as evidenced by keeping all scheduled appointments demonstrate Improved and Ongoing adherence to prescribed treatment plan for HTN and DMII as evidenced by consistent medication compliance, symptom monitoring, continued lifestyle changes continue to work with RN Care Manager to address care management and care coordination needs related to  HTN and DMII as evidenced by adherence to CM Team Scheduled appointments work with pharmacist to address disease state management related toHTN as evidenced by review or EMR and patient or pharmacist report through collaboration with Medical illustrator, provider, and care team.   Interventions: Evaluation of current treatment plan related to  self management and patient's adherence to plan as established by provider   Diabetes Interventions:  (Status:  Goal on track:  Yes.) Long Term Goal Assessed patient's understanding of A1c goal:  <7.5% Provided education to patient about basic DM disease process. Reviewed A1C goal with patient. Currently below goal at this time. Checks blood sugar daily and keeps log. Denies any acute changes at this time. Reviewed medications with patient and  discussed importance of medication adherence. Reports compliance with all medications Counseled on importance of regular laboratory monitoring as prescribed. Labs up to date at this time. Discussed plans with patient for ongoing care management follow up and provided patient with direct contact information for care management team Provided patient with written educational materials related to hypo and hyperglycemia and importance of correct treatment. Denies any recent hypo/hyperglycemic events Reviewed scheduled/upcoming provider appointments including: 03-31-24 Cardiology, 08-12-24 Oncology Advised patient, providing education and rationale, to check cbg at minimum fasting daily and record, calling provider for findings outside established parameters. RNCM reviewed with patient goal fasting <130 and post prandial <180. Review of patient status, including review of consultants reports, relevant laboratory and other test results, and medications completed Screening for signs and symptoms of depression related to chronic disease state  Assessed social determinant of health barriers RNCM provided number to [facility] to schedule annual vision screening. Denies any changes with her skin integrity to bilateral legs/feet. Lab Results  Component Value Date   HGBA1C 7.1 (A) 12/02/2023    Hypertension Interventions:  (Status:  Goal on track:  Yes.) Long Term Goal Last practice recorded BP readings:  BP Readings from Last 3 Encounters:  01/09/24 (!) 127/56  01/08/24 (!) 115/38  01/02/24 (!) 154/60   Most recent eGFR/CrCl:  Lab Results  Component Value Date   EGFR 50 (L) 11/20/2021    No components found for: "CRCL"  Evaluation of current treatment plan related to hypertension self management and patient's adherence to plan as established by provider. Patient knowledgeable about guidelines set for her BP. RNCM reinforced goal for SBP<130 DBP<80. Denies  any swelling, chest pain,  dizziness, or headaches at this time. Provided education to patient re: stroke prevention, s/s of heart attack and stroke. Education and support provided Reviewed medications with patient and discussed importance of compliance. Reports compliance with all medications Counseled on adverse effects of illicit drug and excessive alcohol use in patients with high blood pressure. Denies any illicut drug or excessive alcohol use. Counseled on the importance of exercise goals with target of 150 minutes per week Discussed plans with patient for ongoing care management follow up and provided patient with direct contact information for care management team Advised patient, providing education and rationale, to monitor blood pressure daily and record, calling PCP for findings outside established parameters. RNCM provided reinforced education on BP staying within <130/<90 and to keep log of all readings. Reviewed scheduled/upcoming provider appointments including: 03-31-24 Cardiology Advised patient to discuss elevated BP readings, dizziness or new changes with provider Provided education on prescribed diet ADA/Heart Healthy. Reports she does not have the best appetite at times but she knows that she has to eat a balanced diet. No concerns at this time noted by patient. Discussed complications of poorly controlled blood pressure such as heart disease, stroke, circulatory complications, vision complications, kidney impairment, sexual dysfunction Screening for signs and symptoms of depression related to chronic disease state  Assessed social determinant of health barriers  Patient Goals/Self-Care Activities: Take all medications as prescribed Attend all scheduled provider appointments Call pharmacy for medication refills 3-7 days in advance of running out of medications Attend church or other social activities Perform all self care activities independently  Perform IADL's (shopping, preparing meals, housekeeping,  managing finances) independently Call provider office for new concerns or questions  schedule appointment with eye doctor check blood sugar at prescribed times: once daily and when you have symptoms of low or high blood sugar check feet daily for cuts, sores or redness enter blood sugar readings and medication or insulin into daily log fill half of plate with vegetables keep feet up while sitting wear comfortable, cotton socks wear comfortable, well-fitting shoes check blood pressure daily write blood pressure results in a log or diary call doctor for signs and symptoms of high blood pressure report new symptoms to your doctor  Follow Up Plan:  Telephone follow up appointment with care management team member scheduled for:  03-08-2024 at 9:00 am           Our next appointment is by telephone on 03-08-2024 at 9:00 am  Please call the care guide team at [phone] if you need to cancel or reschedule your appointment.   If you are experiencing a Mental Health or Behavioral Health Crisis or need someone to talk to, please call the Suicide and Crisis Lifeline: 988 call the [place] National Suicide Prevention Lifeline: (347) 158-0847 or TTY: 281-151-0855 TTY (339) 637-5076) to talk to a trained counselor call 1-800-273-TALK (toll free, 24 hour hotline) go to [facility] [address], [place] [phone])   Patient verbalizes understanding of instructions and care plan provided today and agrees to view in MyChart. Active MyChart status and patient understanding of how to access instructions and care plan via MyChart confirmed with patient.     Telephone follow up appointment with care management team member scheduled for:03-08-2024 at 9:00 am  Larey Brick, BSN RN [facility], [facility] Health RN Care Manager Direct Dial: [phone]  Fax: [phone]

## 2024-02-23 DIAGNOSIS — D509 Iron deficiency anemia, unspecified: Secondary | ICD-10-CM | POA: Diagnosis not present

## 2024-02-23 DIAGNOSIS — N1831 Chronic kidney disease, stage 3a: Secondary | ICD-10-CM | POA: Diagnosis not present

## 2024-02-23 DIAGNOSIS — I129 Hypertensive chronic kidney disease with stage 1 through stage 4 chronic kidney disease, or unspecified chronic kidney disease: Secondary | ICD-10-CM | POA: Diagnosis not present

## 2024-02-23 DIAGNOSIS — E1122 Type 2 diabetes mellitus with diabetic chronic kidney disease: Secondary | ICD-10-CM | POA: Diagnosis not present

## 2024-02-23 DIAGNOSIS — I2510 Atherosclerotic heart disease of native coronary artery without angina pectoris: Secondary | ICD-10-CM | POA: Diagnosis not present

## 2024-02-23 DIAGNOSIS — E785 Hyperlipidemia, unspecified: Secondary | ICD-10-CM | POA: Diagnosis not present

## 2024-02-28 ENCOUNTER — Other Ambulatory Visit: Payer: Self-pay | Admitting: Family Medicine

## 2024-03-04 LAB — LAB REPORT - SCANNED
Albumin, Urine POC: 41.3
Creatinine, POC: 97.5 mg/dL
EGFR: 72
Microalb Creat Ratio: 42

## 2024-03-08 ENCOUNTER — Telehealth: Payer: Self-pay | Admitting: *Deleted

## 2024-03-08 ENCOUNTER — Other Ambulatory Visit: Payer: Self-pay | Admitting: *Deleted

## 2024-03-08 NOTE — Patient Outreach (Signed)
  Care Management   Follow Up Note   03/08/2024 Name: [name] MRN: [id number] DOB: 23-Apr-1941   Referred by: Doreene Eland, MD Reason for referral : Care Management (RNCM:ATTEMPTED Follow Up For Chronic Disease Management & Care Coordination Needs )   An unsuccessful telephone outreach was attempted today. The patient was referred to the case management team for assistance with care management and care coordination.   Follow Up Plan: The care management team will reach out to the patient again over the next 30 days.   Larey Brick, BSN RN [facility], [facility] RN Care Manager Direct Dial: [phone]  Fax: [phone]

## 2024-03-11 DIAGNOSIS — H1045 Other chronic allergic conjunctivitis: Secondary | ICD-10-CM | POA: Diagnosis not present

## 2024-03-11 DIAGNOSIS — H35371 Puckering of macula, right eye: Secondary | ICD-10-CM | POA: Diagnosis not present

## 2024-03-11 DIAGNOSIS — E113293 Type 2 diabetes mellitus with mild nonproliferative diabetic retinopathy without macular edema, bilateral: Secondary | ICD-10-CM | POA: Diagnosis not present

## 2024-03-11 DIAGNOSIS — H40013 Open angle with borderline findings, low risk, bilateral: Secondary | ICD-10-CM | POA: Diagnosis not present

## 2024-03-11 DIAGNOSIS — Z961 Presence of intraocular lens: Secondary | ICD-10-CM | POA: Diagnosis not present

## 2024-03-11 LAB — HM DIABETES EYE EXAM

## 2024-03-12 ENCOUNTER — Encounter: Payer: Self-pay | Admitting: Family Medicine

## 2024-03-12 ENCOUNTER — Other Ambulatory Visit: Payer: Self-pay | Admitting: Family Medicine

## 2024-03-12 DIAGNOSIS — E11319 Type 2 diabetes mellitus with unspecified diabetic retinopathy without macular edema: Secondary | ICD-10-CM | POA: Insufficient documentation

## 2024-03-12 DIAGNOSIS — Z1231 Encounter for screening mammogram for malignant neoplasm of breast: Secondary | ICD-10-CM

## 2024-03-16 ENCOUNTER — Other Ambulatory Visit: Payer: Self-pay

## 2024-03-16 MED ORDER — ATORVASTATIN CALCIUM 20 MG PO TABS: 20.0000 mg | ORAL_TABLET | Freq: Every day | ORAL | 1 refills | Status: DC

## 2024-03-24 NOTE — Progress Notes (Signed)
 Cardiology Office Note    Date:  03/31/2024   ID:  Michele, [name] 1941/04/08, MRN [id number]   PCP:  Michele Eland, MD   [facility]  Cardiologist:  Michele Haws, MD   Advanced Practice Provider:  No care team member to display   History of Present Illness:  [name] is a 83 y.o. female with history of CAD status post NSTEMI POBA to PDA 2001, cutting POBA to diagonal 2010, cath 2012 PDA and diagonal sites patent mild nonobstructive disease elsewhere, no ischemia on Myoview 2015, hypertension, HLD, DM, chronic anemia requiring iron infusions.  Patient comes in for yearly f/u. Denies chest pain, dyspnea, dizziness, edema, or presyncope. Has lost 8 lbs and BP has been lower. PCP stopped amlodipine and then decreased lisinopril/HCTZ. Crt 1.54 11/02/21-referred to kidney specialists. 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 No regular exercise outside of that.  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 Remote PTCA of the PDA;  b. 02/2009 PCI/CBA to the D1;  c. 11/2011 Cath: LM nl, LAD 20p, 11m, D1 patent, RI 20p, LCX nl, RCA 30d, EF 55-65%.// Myoview 04/2020: EF 69, no ischemia or infarction, low risk    CTS (carpal tunnel syndrome)    Diabetes mellitus without complication (HCC)    Dysphagia 11/13/2018   GERD (gastroesophageal reflux disease)    History of papillary adenocarcinoma of thyroid 1992   right thyroid lobectomy   History of thyroid cancer    Hypertension    Hypothyroid    Hypothyroidism 02/19/2007   Qualifier: Diagnosis of  By: Knox Royalty     IBS (irritable bowel syndrome) 12/27/2014   Nodule of right lung 08/29/2017   3 mm stable nodule right  upper lobe on CTA Chest 08/28/17   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 08/29/2017   Subscapular pain, left 08/29/2017    Past Surgical History:  Procedure Laterality Date   ANGIOPLASTY     PDA 90-40 %   BARTHOLIN GLAND CYST EXCISION  09-13-2004   Keratosis- no CA   BREAST BIOPSY     LEFT SPOT REMOVED   BREAST EXCISIONAL BIOPSY Left    CARDIAC CATHETERIZATION  03/15/2009   stenosis of diagonal branch of LAD- PTCA performed   CHOLECYSTECTOMY  08-23-2002   CORONARY ANGIOPLASTY     ENDOMETRIAL BIOPSY  02-20-1998   LEFT HEART CATHETERIZATION WITH CORONARY ANGIOGRAM N/A 11/26/2011   Procedure: LEFT HEART CATHETERIZATION WITH CORONARY ANGIOGRAM;  Surgeon: Kimberle Stanfill M Swaziland, MD;  Location: [facility] CATH LAB;  Service: Cardiovascular;  Laterality: N/A;   PTCA  10/23/2000 and 03/15/09   THYROIDECTOMY Right 10-06-1991   Hashimotos and papillary carcinoma   TONSILLECTOMY     TUBAL LIGATION      Current Medications: Current Meds  Medication Sig   Accu-Chek FastClix Lancets MISC USE 1 LANCET TO TEST THREE TIMES A DAY   ACCU-CHEK GUIDE TEST test strip USE ONE TEST STRIP TO TEST THREE TIMES A DAY   aspirin 81 MG chewable tablet Chew 81 mg by mouth daily.   atorvastatin (LIPITOR) 20 MG tablet Take 1 tablet (20  mg total) by mouth daily.   Blood Glucose Monitoring Suppl (ACCU-CHEK GUIDE) w/Device KIT USE AS DIRECTED IN THE MORNING, AT NOON AND AT BEDTIME Strength: w/Device   carvedilol (COREG) 25 MG tablet TAKE 1 TABLET BY MOUTH TWICE A DAY WITH A MEAL   cetirizine (ZYRTEC) 5 MG tablet Take 1 tablet (5 mg total) by mouth daily.   ferrous sulfate 325 (65 FE) MG tablet Take 325 mg by mouth daily with breakfast.   fluticasone (FLONASE) 50 MCG/ACT nasal spray Place 2 sprays into both nostrils daily.   insulin glargine (LANTUS) 100 UNIT/ML Solostar Pen Inject 5 Units into the skin daily.   Insulin Pen Needle 31G X 6 MM MISC Use daily to inject insulin as prescribed   Insulin Syringe-Needle U-100  (B-D INS SYR ULTRAFINE .3CC/31G) 31G X 5/16" 0.3 ML MISC Use to inject insulin daily   isosorbide mononitrate (IMDUR) 60 MG 24 hr tablet TAKE 1 AND 1/2 TABLET BY MOUTH DAILY   JARDIANCE 25 MG TABS tablet TAKE 1 TABLET BY MOUTH DAILY   lisinopril-hydrochlorothiazide (ZESTORETIC) 20-12.5 MG tablet TAKE 1 TABLET BY MOUTH DAILY   metFORMIN (GLUCOPHAGE-XR) 500 MG 24 hr tablet TAKE 1 TABLET BY MOUTH TWICE A DAY   Multiple Vitamin (MULTIVITAMIN WITH MINERALS) TABS tablet Take 1 tablet by mouth daily. Reported on 04/02/2016   nitroGLYCERIN (NITROSTAT) 0.4 MG SL tablet Place 1 tablet (0.4 mg total) under the tongue every 5 (five) minutes as needed for chest pain.     Allergies:   Codeine, Sulfamethoxazole-trimethoprim, and Tramadol   Social History   Socioeconomic History   Marital status: Widowed    Spouse name: Not on file   Number of children: 6   Years of education: 10   Highest education level: Not on file  Occupational History   Occupation: Housekeeper    Employer: [employer]  Tobacco Use   Smoking status: Former    Current packs/day: 0.00    Types: Cigarettes    Quit date: 12/23/1980    Years since quitting: 43.2   Smokeless tobacco: Former    Types: Chew    Quit date: 08/23/1994  Vaping Use   Vaping status: Never Used  Substance and Sexual Activity   Alcohol use: No   Drug use: No   Sexual activity: Not Currently    Birth control/protection: Surgical  Other Topics Concern   Not on file  Social History Narrative   Lives in [place] with Granddaughter.  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  She is not routinely exercising.   Has her GED   Social Drivers of Corporate investment banker Strain: Low Risk  (09/01/2023)   Overall Financial Resource Strain (CARDIA)    Difficulty of Paying Living Expenses: Not hard at all  Food Insecurity: No Food Insecurity (09/01/2023)   Hunger Vital Sign    Worried About Running Out of Food in the Last Year: Never true    Ran Out of  Food in the Last Year: Never true  Transportation Needs: No Transportation Needs (09/01/2023)   PRAPARE - Administrator, Civil Service (Medical): No    Lack of Transportation (Non-Medical): No  Physical Activity: Insufficiently Active (09/01/2023)   Exercise Vital Sign    Days of Exercise per Week: 3 days    Minutes of Exercise per Session: 30 min  Stress: No Stress Concern Present (09/01/2023)   [employer] of Occupational Health - Occupational Stress Questionnaire  Feeling of Stress : Not at all  Social Connections: Moderately Isolated (09/01/2023)   Social Connection and Isolation Panel [NHANES]    Frequency of Communication with Friends and Family: More than three times a week    Frequency of Social Gatherings with Friends and Family: Once a week    Attends Religious Services: 1 to 4 times per year    Active Member of [employer] or Organizations: No    Attends Banker Meetings: Never    Marital Status: Widowed     Family History:  The patient's  family history includes Breast cancer (age of onset: 26) in her daughter; Cancer in her granddaughter; Diabetes in her mother; Diabetes Mellitus II in her brother and brother; Epilepsy in her brother; Heart attack in her brother, brother, and sister; Hypertension in her mother; Other in her father.   ROS:   Please see the history of present illness.    ROS All other systems reviewed and are negative.   PHYSICAL EXAM:   VS:  BP 110/62   Pulse 70   Ht 5' 2.5" (1.588 m)   Wt 136 lb 6.4 oz (61.9 kg)   LMP 11/24/1992   SpO2 99%   BMI 24.55 kg/m   Physical Exam   Affect appropriate Healthy:  appears stated age HEENT: normal Neck supple with no adenopathy JVP normal bilateral bruits no thyromegaly Lungs clear with no wheezing and good diaphragmatic motion Heart:  S1/S2 no murmur, no rub, gallop or click PMI normal Abdomen: benighn, BS positve, no tenderness, no AAA no bruit.  No HSM or HJR Distal pulses  intact with no bruits No edema Neuro non-focal Skin warm and dry No muscular weakness   Wt Readings from Last 3 Encounters:  03/31/24 136 lb 6.4 oz (61.9 kg)  01/08/24 137 lb (62.1 kg)  01/02/24 137 lb 6.4 oz (62.3 kg)      Studies/Labs Reviewed:   EKG:  03/31/2024 SR rate 73 LVH T inversions inferior lateral leads   Recent Labs: 08/11/2023: ALT 13; BUN 25; Creatinine 1.28; Hemoglobin 10.2; Platelet Count 106; Potassium 4.1; Sodium 143   Lipid Panel    Component Value Date/Time   CHOL 125 12/31/2022 1017   TRIG 75 12/31/2022 1017   HDL 55 12/31/2022 1017   CHOLHDL 2.3 12/31/2022 1017   CHOLHDL 2.2 08/06/2016 1135   VLDL 24 08/06/2016 1135   LDLCALC 55 12/31/2022 1017   LDLDIRECT 76 02/06/2011 1134    Additional studies/ records that were reviewed today include:  Myoview 04/26/2020 EF 69, no ischemia or infarction, low risk   Carotid US 11/29/21 Bilateral ICA 1-39   Myoview 01/19/2014 No ischemia, EF 70; inferolateral scar   Myoview 03/07/2013 Inferolateral scar, no ischemia, EF 72   Cardiac catheterization 11/26/2011 LAD proximal 20, mid 30; D1 patent RI proximal 20 LCx small without significant disease RCA patent; PDA patent, 35 prior to bifurcation EF 55-65   Cardiac catheterization 03/14/2009 LAD mid 30-40; BX proximal 80-90 RCA 30; PDA 50 Normal LV function PCI: Cutting Balloon angioplasty to the diagonal   Echocardiogram 11/19/2006 EF 55    PLAN:  In order of problems listed above:   CAD:  PTCA PDA in 2001 and diagonal in 2010 with patent stents on cath in 2012 Myovue done 04/26/20 low risk with no ischemia EF 69%  Continue medical Rx. No angina. 150 min exercise daily.New nitroglycerin called in    HTN:  has been running low and amlodipine stopped and lisinopril/HCTZ decreased  to one tablet by PCP. 24 hour monitor done 12/2023 showed average BP mean 127/56 mmHg and 119/55 mmHg average at night   DM2:   A1c 7.1 on Jardiance  and Lantus f/u endocrine     HLD continue statin LDL at goal 55 12/31/22    Anemia:  followed by heme. Most recent Hct stable 32    Carotid bruit- duplex 11/30/21 plaque no stenosis 1-39% f/u ordered   CRF:  Cr 1.28 f/u with primary and nephrology   Carotid duplex bruits  F/U in a year   Signed, Michele Haws, MD  03/31/2024 9:48 AM    [facility] [address] [place], [place], [place]  [address] Phone: [phone]; Fax: [phone]

## 2024-03-29 ENCOUNTER — Telehealth: Payer: Self-pay

## 2024-03-29 ENCOUNTER — Other Ambulatory Visit: Payer: Self-pay | Admitting: Family Medicine

## 2024-03-29 MED ORDER — INSULIN GLARGINE 100 UNIT/ML SOLOSTAR PEN: 5.0000 [IU] | PEN_INJECTOR | Freq: Every day | SUBCUTANEOUS | 1 refills | Status: DC

## 2024-03-29 MED ORDER — INSULIN PEN NEEDLE 31G X 6 MM MISC: 1 refills | Status: DC

## 2024-03-29 NOTE — Telephone Encounter (Signed)
 Received call from patient's insurance company regarding patient having issues with drawing up insulin.   Patient feels that they syringes that she is currently using is causing air bubble when she is drawing up medication.   Insurance rep states that patient had previously been using Relion needles, however, these are not covered by insurance. Representative was asking about alternative needles that are covered that we could send in.  Discovered while speaking with insurance that Lantus solostar pen is also covered by insurance. This would allow patient to inject without having to draw up insulin.   Advised that I would message Dr. Lum Babe to see if this change would be appropriate or if PCP would like to send alternative brand needles.   Veronda Prude, RN

## 2024-03-30 NOTE — Telephone Encounter (Signed)
 Called patient. She did not answer, LVM asking that she return call to office for update.   Veronda Prude, RN

## 2024-03-31 ENCOUNTER — Ambulatory Visit: Payer: Medicare PPO | Attending: Cardiovascular Disease | Admitting: Cardiovascular Disease

## 2024-03-31 VITALS — BP 110/62 | HR 70 | Ht 62.5 in | Wt 136.4 lb

## 2024-03-31 DIAGNOSIS — E785 Hyperlipidemia, unspecified: Secondary | ICD-10-CM

## 2024-03-31 DIAGNOSIS — I10 Essential (primary) hypertension: Secondary | ICD-10-CM | POA: Diagnosis not present

## 2024-03-31 DIAGNOSIS — I2510 Atherosclerotic heart disease of native coronary artery without angina pectoris: Secondary | ICD-10-CM

## 2024-03-31 DIAGNOSIS — R0989 Other specified symptoms and signs involving the circulatory and respiratory systems: Secondary | ICD-10-CM

## 2024-03-31 MED ORDER — NITROGLYCERIN 0.4 MG SL SUBL: 0.4000 mg | SUBLINGUAL_TABLET | SUBLINGUAL | 3 refills | Status: AC | PRN

## 2024-03-31 NOTE — Patient Instructions (Addendum)
 Medication Instructions:  Your physician recommends that you continue on your current medications as directed. Please refer to the Current Medication list given to you today.  *If you need a refill on your cardiac medications before your next appointment, please call your pharmacy*  Lab Work: NONE If you have labs (blood work) drawn today and your tests are completely normal, you will receive your results only by: MyChart Message (if you have MyChart) OR A paper copy in the mail If you have any lab test that is abnormal or we need to change your treatment, we will call you to review the results.  Testing/Procedures: Your physician has requested that you have a carotid duplex. This test is an ultrasound of the carotid arteries in your neck. It looks at blood flow through these arteries that supply the brain with blood. Allow one hour for this exam. There are no restrictions or special instructions.  Follow-Up: At [facility], you and your health needs are our priority.  As part of our continuing mission to provide you with exceptional heart care, our providers are all part of one team.  This team includes your primary Cardiologist (physician) and Advanced Practice Providers or APPs (Physician Assistants and Nurse Practitioners) who all work together to provide you with the care you need, when you need it.  Your next appointment:   1 year(s)  Provider:   Charlton Haws, MD   We recommend signing up for the patient portal called "MyChart".  Sign up information is provided on this After Visit Summary.  MyChart is used to connect with patients for Virtual Visits (Telemedicine).  Patients are able to view lab/test results, encounter notes, upcoming appointments, etc.  Non-urgent messages can be sent to your provider as well.   To learn more about what you can do with MyChart, go to [email or web address].   Other Instructions       1st Floor: - Lobby - Registration  - Pharmacy   - Lab - Cafe  2nd Floor: - PV Lab - Diagnostic Testing (echo, CT, nuclear med)  3rd Floor: - Vacant  4th Floor: - TCTS (cardiothoracic surgery) - AFib Clinic - Structural Heart Clinic - Vascular Surgery  - Vascular Ultrasound  5th Floor: - HeartCare Cardiology (general and EP) - Clinical Pharmacy for coumadin, hypertension, lipid, weight-loss medications, and med management appointments    Valet parking services will be available as well.

## 2024-04-05 ENCOUNTER — Other Ambulatory Visit: Payer: Self-pay | Admitting: Family Medicine

## 2024-04-12 ENCOUNTER — Telehealth: Payer: Self-pay | Admitting: *Deleted

## 2024-04-12 NOTE — Progress Notes (Signed)
 Complex Care Management Care Guide Note  04/12/2024 Name: BETHANI BRUGGER MRN: [id number] DOB: 11-30-1941  [name] is a 83 y.o. year old female who is a primary care patient of Arn Lane, MD and is actively engaged with the care management team. I reached out to Avril H Maese by phone today to assist with re-scheduling  with the RN Case Manager.  Follow up plan: Unsuccessful telephone outreach attempt made. A HIPAA compliant phone message was left for the patient providing contact information and requesting a return call.  Barnie Bora  [facility]  [facility], [facility] Guide  Direct Dial: [phone]  Fax [phone]

## 2024-04-12 NOTE — Progress Notes (Signed)
 Complex Care Management Care Guide Note  04/12/2024 Name: [name] MRN: [id number] DOB: 05-13-1941  [name] is a 83 y.o. year old female who is a primary care patient of Michele Lane, MD and is actively engaged with the care management team. I reached out to [name] by phone today to assist with re-scheduling  with the [name].  Follow up plan: Telephone appointment with complex care management team member scheduled for:  5/27  [name]  [facility]  [name], [name] Guide  Direct Dial: [phone]  Fax [phone]

## 2024-04-13 ENCOUNTER — Inpatient Hospital Stay: Admission: RE | Admit: 2024-04-13 | Source: Ambulatory Visit

## 2024-04-24 ENCOUNTER — Other Ambulatory Visit: Payer: Self-pay | Admitting: Cardiovascular Disease

## 2024-04-28 ENCOUNTER — Ambulatory Visit (HOSPITAL_COMMUNITY)
Admission: RE | Admit: 2024-04-28 | Discharge: 2024-04-28 | Disposition: A | Discharge status: Home / Self Care | Source: Ambulatory Visit | Attending: Cardiovascular Disease | Admitting: Cardiovascular Disease

## 2024-04-28 DIAGNOSIS — R0989 Other specified symptoms and signs involving the circulatory and respiratory systems: Secondary | ICD-10-CM

## 2024-05-02 ENCOUNTER — Ambulatory Visit (HOSPITAL_COMMUNITY): Admission: EM | Admit: 2024-05-02 | Discharge: 2024-05-02 | Disposition: A | Discharge status: Home / Self Care

## 2024-05-02 ENCOUNTER — Encounter (HOSPITAL_COMMUNITY): Payer: Self-pay

## 2024-05-02 DIAGNOSIS — H6592 Unspecified nonsuppurative otitis media, left ear: Secondary | ICD-10-CM

## 2024-05-02 NOTE — ED Provider Notes (Signed)
 [facility]  Note:  This document was prepared using Dragon voice recognition software and may include unintentional dictation errors.  MRN: [id number] DOB: 03/14/1941  Subjective:   [name] is a 83 y.o. female presenting for left ear fullness that started about an hour prior to arrival.  Patient denies any past history of cerumen impaction, no current pain or purulent drainage from ears.  Patient reports that all of a sudden today her hearing became somewhat blunted.  Patient reports that it sounds like there is water in her ear.  Patient has been seen previously by ENT for fluid buildup in ears.  Denies any other secondary symptoms.  No current facility-administered medications for this encounter.  Current Outpatient Medications:    Accu-Chek FastClix Lancets MISC, USE 1 LANCET TO TEST THREE TIMES A DAY, Disp: 102 each, Rfl: 4   ACCU-CHEK GUIDE TEST test strip, USE ONE TEST STRIP TO TEST THREE TIMES A DAY, Disp: 100 strip, Rfl: 5   aspirin  81 MG chewable tablet, Chew 81 mg by mouth daily., Disp: , Rfl:    atorvastatin  (LIPITOR) 20 MG tablet, Take 1 tablet (20 mg total) by mouth daily., Disp: 90 tablet, Rfl: 1   Blood Glucose Monitoring Suppl (ACCU-CHEK GUIDE) w/Device KIT, USE AS DIRECTED IN THE MORNING, AT NOON AND AT BEDTIME Strength: w/Device, Disp: 1 kit, Rfl: 0   carvedilol  (COREG ) 25 MG tablet, TAKE 1 TABLET BY MOUTH TWICE A DAY WITH A MEAL, Disp: 180 tablet, Rfl: 1   cetirizine  (ZYRTEC ) 5 MG tablet, Take 1 tablet (5 mg total) by mouth daily., Disp: 30 tablet, Rfl: 1   ferrous sulfate  325 (65 FE) MG tablet, Take 325 mg by mouth daily with breakfast., Disp: , Rfl:    fluticasone  (FLONASE ) 50 MCG/ACT nasal spray, Place 2 sprays into both nostrils daily., Disp: 16 g, Rfl: 1   insulin  glargine (LANTUS ) 100 UNIT/ML Solostar Pen, Inject 5 Units into the skin daily., Disp: 3 mL, Rfl: 1   Insulin  Pen Needle 31G X 6 MM MISC, Use daily to inject insulin  as prescribed,  Disp: 100 each, Rfl: 1   Insulin  Syringe-Needle U-100 (B-D INS SYR ULTRAFINE .3CC/31G) 31G X 5/16" 0.3 ML MISC, Use to inject insulin  daily, Disp: 100 each, Rfl: 6   isosorbide  mononitrate (IMDUR ) 60 MG 24 hr tablet, TAKE 1 AND 1/2 TABLET BY MOUTH DAILY, Disp: 135 tablet, Rfl: 3   JARDIANCE  25 MG TABS tablet, TAKE 1 TABLET BY MOUTH DAILY, Disp: 90 tablet, Rfl: 1   lisinopril -hydrochlorothiazide  (ZESTORETIC ) 20-12.5 MG tablet, TAKE 1 TABLET BY MOUTH DAILY, Disp: 90 tablet, Rfl: 1   metFORMIN  (GLUCOPHAGE -XR) 500 MG 24 hr tablet, TAKE 1 TABLET BY MOUTH TWICE A DAY, Disp: 180 tablet, Rfl: 1   Multiple Vitamin (MULTIVITAMIN WITH MINERALS) TABS tablet, Take 1 tablet by mouth daily. 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 Remote  PTCA of the PDA;  b. 02/2009 PCI/CBA to the D1;  c. 11/2011 Cath: LM nl, LAD 20p, 76m, D1 patent, RI 20p, LCX nl, RCA 30d, EF 55-65%.// Myoview  04/2020: EF 69, no ischemia or infarction, low risk    CTS (carpal tunnel syndrome)    Diabetes mellitus without complication (HCC)    Dysphagia 11/13/2018   GERD (gastroesophageal reflux disease)    History of papillary adenocarcinoma of thyroid  1992   right thyroid  lobectomy   History of thyroid  cancer    Hypertension    Hypothyroid    Hypothyroidism 02/19/2007   Qualifier: Diagnosis of  By: Winona Haw     IBS (irritable bowel syndrome) 12/27/2014   Nodule of right lung 08/29/2017   3 mm  stable nodule right upper lobe on CTA Chest 08/28/17   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 08/29/2017   Subscapular pain, left 08/29/2017     Past Surgical History:  Procedure Laterality Date   ANGIOPLASTY     PDA 90-40 %   BARTHOLIN GLAND CYST EXCISION  09-13-2004   Keratosis- no CA   BREAST BIOPSY     LEFT SPOT REMOVED   BREAST EXCISIONAL BIOPSY Left    CARDIAC CATHETERIZATION  03/15/2009   stenosis of diagonal branch of LAD- PTCA performed   CHOLECYSTECTOMY  08-23-2002   CORONARY ANGIOPLASTY     ENDOMETRIAL BIOPSY  02-20-1998   LEFT HEART CATHETERIZATION WITH CORONARY ANGIOGRAM N/A 11/26/2011   Procedure: LEFT HEART CATHETERIZATION WITH CORONARY ANGIOGRAM;  Surgeon: Peter M Swaziland, MD;  Location: [facility] CATH LAB;  Service: Cardiovascular;  Laterality: N/A;   PTCA  10/23/2000 and 03/15/09   THYROIDECTOMY Right 10-06-1991   Hashimotos and papillary carcinoma   TONSILLECTOMY     TUBAL LIGATION      Family History  Problem Relation Age of Onset   Diabetes Mother        Deceased- hypoglycemic coma    Hypertension Mother    Other Father        unaware of his health history - died when pt was 83 yr old.   Heart attack Sister    Diabetes Mellitus II Brother    Heart attack Brother    Heart attack Brother    Diabetes Mellitus II Brother    Epilepsy Brother    Breast cancer Daughter 83   Cancer Granddaughter     Social History   Tobacco Use   Smoking status: Former    Current packs/day: 0.00    Types: Cigarettes    Quit date: 12/23/1980    Years since quitting: 43.3   Smokeless tobacco: Former    Types: Chew    Quit date: 08/23/1994  Vaping Use   Vaping status: Never Used  Substance Use Topics   Alcohol use: No   Drug use: No    ROS Refer to HPI for ROS details.  Objective:   Vitals: BP (!) 169/69 (BP Location: Right Arm)   Pulse 62   Temp 98.4 F (36.9 C) (Oral)   Resp 16   LMP 11/24/1992   SpO2 94%   Physical Exam Vitals and nursing note  reviewed.  Constitutional:      General: She is not in acute distress.    Appearance: She is well-developed. She is not ill-appearing or toxic-appearing.  HENT:     Head: Normocephalic and atraumatic.     Right Ear: Tympanic membrane, ear canal and external ear normal. No middle ear effusion. Tympanic membrane is  not injected, erythematous or bulging.     Left Ear: Ear canal and external ear normal. A middle ear effusion is present. Tympanic membrane is not injected, erythematous or bulging.     Nose: Nose normal.     Mouth/Throat:     Mouth: Mucous membranes are moist.  Cardiovascular:     Rate and Rhythm: Normal rate.  Pulmonary:     Effort: Pulmonary effort is normal. No respiratory distress.  Musculoskeletal:        General: Normal range of motion.  Skin:    General: Skin is warm and dry.  Neurological:     General: No focal deficit present.     Mental Status: She is alert and oriented to person, place, and time.  Psychiatric:        Mood and Affect: Mood normal.        Behavior: Behavior normal.     Procedures  No results found for this or any previous visit (from the past 24 hours).  No results found.   Assessment and Plan :     Discharge Instructions       1. Middle ear effusion, left (Primary) - Inspection of left ear reveals no immediate infection or swelling to the ear canal, moderate middle ear effusion noted on exam. - Middle ear effusion is often treated with nasal spray, anti-inflammatory medication such as ibuprofen  or daily allergy medication to relieve allergy sinus inflammation. - Continue to monitor symptoms if there is any escalation of current symptoms or development of new symptoms follow-up for further evaluation and management.    Norwood Quezada B Veva Grimley   Michaiah Holsopple, [place] B, [place] 05/02/24 1646

## 2024-05-02 NOTE — ED Triage Notes (Signed)
 Patient presenting with left ear fullness onset today. States she has a history of fluid build up in the ear. States today is the first day she has heard a "fluttering" with the ear fullness. Denies pain, congestion, or any cold symptoms.   Prescriptions or OTC medications tried: None

## 2024-05-02 NOTE — Discharge Instructions (Signed)
  1. Middle ear effusion, left (Primary) - Inspection of left ear reveals no immediate infection or swelling to the ear canal, moderate middle ear effusion noted on exam. - Middle ear effusion is often treated with nasal spray, anti-inflammatory medication such as ibuprofen  or daily allergy medication to relieve allergy sinus inflammation. - Continue to monitor symptoms if there is any escalation of current symptoms or development of new symptoms follow-up for further evaluation and management.

## 2024-05-04 ENCOUNTER — Telehealth: Payer: Self-pay | Admitting: Cardiovascular Disease

## 2024-05-04 ENCOUNTER — Ambulatory Visit: Payer: Self-pay | Admitting: *Deleted

## 2024-05-04 NOTE — Telephone Encounter (Signed)
 Pt returning call for results, requesting cb

## 2024-05-04 NOTE — Telephone Encounter (Signed)
 All Conversations: Results (Newest Message First)            Me    05/04/24  1:42 PM Note The patient has been notified of the result and verbalized understanding.  All questions (if any) were answered. Peggi Bowels, LPN 1/61/0960 4:54 PM          05/04/24  1:41 PM You attempted to contact Hebert Littler H (Completed)  Me    05/04/24  1:41 PM Note ----- Message from Janelle Mediate sent at 04/30/2024  4:31 PM EDT ----- Plaque no stenosis f/u carotid duplex in 2 years

## 2024-05-04 NOTE — Telephone Encounter (Signed)
 The patient has been notified of the result and verbalized understanding.  All questions (if any) were answered. Peggi Bowels, LPN 1/61/0960 4:54 PM

## 2024-05-04 NOTE — Telephone Encounter (Signed)
-----   Message from Janelle Mediate sent at 04/30/2024  4:31 PM EDT ----- Plaque no stenosis f/u carotid duplex in 2 years

## 2024-05-10 ENCOUNTER — Ambulatory Visit: Admitting: Family Medicine

## 2024-05-10 VITALS — BP 137/57 | HR 73 | Ht 62.0 in | Wt 134.2 lb

## 2024-05-10 DIAGNOSIS — M7552 Bursitis of left shoulder: Secondary | ICD-10-CM | POA: Diagnosis not present

## 2024-05-10 NOTE — Patient Instructions (Signed)
 It was great to see you! Thank you for allowing me to participate in your care!  Our plans for today:  - You have some inflammation in your left shoulder. - Please take Tylenol  Extra Strength three times a day. - You may also try some lidocaine  patches to one a day to help your shoulder - I have attached some shoulder exercises. - If these do not help over the next 2 weeks please call to schedule a follow-up   Please arrive 15 minutes PRIOR to your next scheduled appointment time! If you do not, this affects OTHER patients' care.  Take care and seek immediate care sooner if you develop any concerns.   Ivin Marrow, MD, PGY-2 [facility] 2:24 PM 05/10/2024  [facility]

## 2024-05-10 NOTE — Progress Notes (Signed)
    SUBJECTIVE:   CHIEF COMPLAINT / HPI: shoulder pain  Left should/neck pain started Saturday morning Tylenol  and Biofreeze helped Saturday night Intermittent pain, sometimes severe Just "hurts" cannot describe. No falls or trauma. Does not change with movement. No aggravating factors. Does not shoot down arm.   PERTINENT  PMH / PSH: Nodule of right lung, T2DM, DJD, CKD3, Hx of MI, HTN, CAD  OBJECTIVE:   BP (!) 137/57   Pulse 73   Ht 5\' 2"  (1.575 m)   Wt 134 lb 4 oz (60.9 kg)   LMP 11/24/1992   SpO2 100%   BMI 24.55 kg/m   General: NAD, well appearing Neuro: A&O Respiratory: normal WOB on RA Extremities: Moving all 4 extremities equally Left shoulder: No obvious swelling, deformity of left shoulder, no tenderness to palpation of the trapezius muscle, and transversalis, AC joint, clavicle, hyper send range of motion flexion, abduction, internal and external rotation in comparison to the right shoulder, positive crossarm, positive empty can, positive Hawkins   ASSESSMENT/PLAN:   Assessment & Plan Subacromial bursitis of left shoulder joint Likely subacromial bursitis versus AC joint arthritis based on exam.  Not a classical clinical presentation however.  Low suspicion for cardiac cause of pain. Patient agreeable to supportive care, follow-up as needed.  Recommended extra strength Tylenol  650 mg 3 times a day, right with shoulder exercises recommended 2-3 times a day 4 times per week.  Additionally recommended lidocaine  patches every 24 hours.  Return if symptoms worsen or fail to improve.  Ivin Marrow, MD [facility]

## 2024-05-12 ENCOUNTER — Other Ambulatory Visit: Payer: Self-pay | Admitting: Family Medicine

## 2024-05-18 ENCOUNTER — Other Ambulatory Visit: Payer: Self-pay

## 2024-05-18 NOTE — Patient Instructions (Signed)
 Visit Information  Thank you for taking time to visit with me today. Please don't hesitate to contact me if I can be of assistance to you before our next scheduled appointment.  Your next care management appointment is  on scheduled for:  07/14/24  930 am  per patient request.  Please call the care guide team at [phone] if you need to cancel, schedule, or reschedule an appointment.   Please call 1-800-273-TALK (toll free, 24 hour hotline) if you are experiencing a Mental Health or Behavioral Health Crisis or need someone to talk to.  Augustin Leber RN, BSN, [facility] [place]  [facility], [facility]  Care Coordinator Phone: [phone]

## 2024-05-18 NOTE — Patient Outreach (Signed)
 Complex Care Management   Visit Note  05/18/2024  Name:  [name] MRN: [id number] DOB: 30-Mar-1941  Situation: Referral received for Complex Care Management related to Diabetes and HTN I obtained verbal consent from Patient.  Visit completed with patient  on the phone  Background:   Past Medical History:  Diagnosis Date   Anemia    Aortic arch atherosclerosis (HCC) 08/29/2017   Cancer (HCC)    Carpal tunnel syndrome 02/19/2007   Qualifier: Diagnosis of  By: Winona Haw     Coronary artery disease    a. Remote PTCA of the PDA;  b. 02/2009 PCI/CBA to the D1;  c. 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 thyroid  lobectomy   History of thyroid  cancer    Hypertension    Hypothyroid    Hypothyroidism 02/19/2007   Qualifier: Diagnosis of  By: Winona Haw     IBS (irritable bowel syndrome) 12/27/2014   Nodule of right lung 08/29/2017   3 mm stable nodule right upper lobe on CTA Chest 08/28/17   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 08/29/2017   Subscapular pain, left 08/29/2017    Assessment: Patient Reported Symptoms:  Cognitive Cognitive Status: Able to follow simple commands, Alert and oriented to person, place, and time, Normal speech and language skills      Neurological Neurological Review of Symptoms: No symptoms reported    HEENT HEENT Symptoms Reported: No symptoms reported      Cardiovascular Cardiovascular Symptoms Reported: No symptoms reported    Respiratory Respiratory Symptoms Reported: No symptoms reported    Endocrine Is patient diabetic?: Yes Is patient checking blood sugars at home?: Yes Endocrine Conditions:  Diabetes Endocrine Management Strategies: Medical device, Medication therapy  Gastrointestinal Gastrointestinal Symptoms Reported: Diarrhea, Abdominal pain or discomfort, Cramping, Other Additional Gastrointestinal Details: Diverticulitis Gastrointestinal Conditions: Diarrhea Gastrointestinal Management Strategies: Medication therapy    Genitourinary Genitourinary Symptoms Reported: No symptoms reported    Integumentary Integumentary Symptoms Reported: No symptoms reported    Musculoskeletal Musculoskelatal Symptoms Reviewed: No symptoms reported        Psychosocial       Quality of Family Relationships: supportive Do you feel physically threatened by others?: No      05/18/2024   11:33 AM  Depression screen PHQ 2/9  Decreased Interest 0  Down, Depressed, Hopeless 0  PHQ - 2 Score 0    There were no vitals filed for this visit.  Medications Reviewed Today     Reviewed by Augustin Leber, RN (Registered Nurse) on 05/18/24 at 1121  Med List Status: <None>   Medication Order Taking? Sig Documenting Provider Last Dose Status Informant  Accu-Chek FastClix Lancets MISC [id number] Yes USE 1 LANCET TO TEST THREE TIMES A DAY Arn Lane, MD Taking Active   ACCU-CHEK GUIDE TEST test strip [id number] Yes USE ONE TEST STRIP TO TEST THREE TIMES A DAY Arn Lane, MD Taking Active   aspirin  81 MG chewable tablet [id number] Yes Chew 81 mg by mouth daily. [provider] Taking Active Self  atorvastatin  (LIPITOR) 20 MG tablet [id number] Yes Take 1 tablet (20 mg total) by mouth daily. [place] Lax,  Jonah Negus, MD Taking Active   Blood Glucose Monitoring Suppl (ACCU-CHEK GUIDE) w/Device KIT [id number] Yes USE AS DIRECTED IN THE MORNING, AT NOON AND AT BEDTIME Strength: w/Device Arn Lane, MD Taking Active   carvedilol  (COREG ) 25 MG tablet [id number] Yes TAKE 1 TABLET BY MOUTH TWICE A DAY WITH A MEAL Eniola, Jonah Negus, MD Taking Active   cetirizine  (ZYRTEC ) 5 MG tablet [id number] Yes  Take 1 tablet (5 mg total) by mouth daily. Arn Lane, MD Taking Active            Med Note [name], [name] Free Jan 08, 2024  8:37 AM) prn  ferrous sulfate  325 (65 FE) MG tablet [id number] Yes Take 325 mg by mouth daily with breakfast. [provider] Taking Active Self  fluticasone  (FLONASE ) 50 MCG/ACT nasal spray [id number] Yes Place 2 sprays into both nostrils daily. Arn Lane, MD Taking Active            Med Note [name], [name] Free Jan 08, 2024  8:38 AM) prn  insulin  glargine (LANTUS ) 100 UNIT/ML Solostar Pen [id number] Yes Inject 5 Units into the skin daily. Arn Lane, MD Taking Active   Insulin  Pen Needle 31G X 6 MM MISC [id number] Yes Use daily to inject insulin  as prescribed [name], MD Taking Active   Insulin  Syringe-Needle U-100 (B-D INS SYR ULTRAFINE .3CC/31G) 31G X 5/16" 0.3 ML MISC [id number] Yes Use to inject insulin  daily [name], MD Taking Active   isosorbide  mononitrate (IMDUR ) 60 MG 24 hr tablet [id number] Yes TAKE 1 AND 1/2 TABLET BY MOUTH DAILY Nishan, Peter C, MD Taking Active   JARDIANCE  25 MG TABS tablet [id number] Yes TAKE 1 TABLET BY MOUTH DAILY Arn Lane, MD Taking Active   lisinopril -hydrochlorothiazide  (ZESTORETIC ) 20-12.5 MG tablet [id number] Yes TAKE 1 TABLET BY MOUTH DAILY Arn Lane, MD Taking Active   metFORMIN  (GLUCOPHAGE -XR) 500 MG 24 hr tablet [id number] Yes Take 2 tablets (1,000 mg total) by mouth daily with breakfast. Arn Lane, MD Taking Active   Multiple Vitamin (MULTIVITAMIN WITH MINERALS) TABS tablet [id number] Yes Take 1 tablet by mouth daily. Reported on 04/02/2016 [provider] Taking Active Self  nitroGLYCERIN  (NITROSTAT ) 0.4 MG SL tablet [id number] Yes Place 1 tablet (0.4 mg total) under the tongue every 5 (five) minutes as needed for chest pain. Loyde Rule, MD Taking Active             Recommendation:   PCP Follow-up  Follow Up Plan:   Telephone follow up  appointment with care management team member scheduled for:  07/14/24  930 am  per patient request.  Augustin Leber RN, BSN, [facility]  [facility], [facility]  Care Coordinator Phone: [phone]

## 2024-05-19 ENCOUNTER — Other Ambulatory Visit: Payer: Self-pay | Admitting: Family Medicine

## 2024-05-19 DIAGNOSIS — Z1231 Encounter for screening mammogram for malignant neoplasm of breast: Secondary | ICD-10-CM

## 2024-05-21 ENCOUNTER — Ambulatory Visit
Admission: RE | Admit: 2024-05-21 | Discharge: 2024-05-21 | Disposition: A | Discharge status: Home / Self Care | Source: Ambulatory Visit | Attending: Family Medicine | Admitting: Family Medicine

## 2024-05-21 DIAGNOSIS — Z1231 Encounter for screening mammogram for malignant neoplasm of breast: Secondary | ICD-10-CM | POA: Diagnosis not present

## 2024-05-26 ENCOUNTER — Ambulatory Visit: Payer: Self-pay | Admitting: Family Medicine

## 2024-06-21 DIAGNOSIS — H35371 Puckering of macula, right eye: Secondary | ICD-10-CM | POA: Diagnosis not present

## 2024-06-21 NOTE — Progress Notes (Signed)
 Contacted the patient but she did not answer.  I was unable to eave her a voice mail.

## 2024-07-08 ENCOUNTER — Telehealth: Payer: Self-pay | Admitting: Hematology and Oncology

## 2024-07-08 NOTE — Telephone Encounter (Signed)
 Rescheule appointment per provider pal.  Called left VM with changes made to the upcoming appointment.

## 2024-07-14 ENCOUNTER — Other Ambulatory Visit: Payer: Self-pay

## 2024-07-14 NOTE — Patient Instructions (Signed)
 Visit Information  Thank you for taking time to visit with me today. Please don't hesitate to contact me if I can be of assistance to you before our next scheduled appointment.  Telephone follow up appointment with care management team member scheduled for:  08/16/24 930 am with Olam Ku RNCM.   Please call the care guide team at [phone] if you need to cancel, schedule, or reschedule an appointment.   Please call the Suicide and Crisis Lifeline: 988 call the [place]  National Suicide Prevention Lifeline: (334)120-3626 or TTY: (579)021-5726 TTY 905-310-6525) to talk to a trained counselor call 1-800-273-TALK (toll free, 24 hour hotline) call 911 if you are experiencing a Mental Health or Behavioral Health Crisis or need someone to talk to.  Wilbert Diver RN, BSN, [facility]  [place], [facility]    Care Coordinator Phone: [phone]

## 2024-07-14 NOTE — Patient Outreach (Signed)
 Complex Care Management   Visit Note  07/14/2024  Name:  [name] MRN: [id number] DOB: 03/17/1941  Situation: Referral received for Complex Care Management related to Diabetes with Complications and HTN I obtained verbal consent from Patient.  Visit completed with patient  on the phone  Background:   Past Medical History:  Diagnosis Date   Anemia    Aortic arch atherosclerosis (HCC) 08/29/2017   Cancer (HCC)    Carpal tunnel syndrome 02/19/2007   Qualifier: Diagnosis of  By: Manford Longs     Coronary artery disease    a. Remote PTCA of the PDA;  b. 02/2009 PCI/CBA to the D1;  c. 11/2011 Cath: LM nl, LAD 20p, 49m, D1 patent, RI 20p, LCX nl, RCA 30d, EF 55-65%.// Myoview  04/2020: EF 69, no ischemia or infarction, low risk    CTS (carpal tunnel syndrome)    Diabetes mellitus without complication (HCC)    Dysphagia 11/13/2018   GERD (gastroesophageal reflux disease)    History of papillary adenocarcinoma of thyroid  1992   right thyroid  lobectomy   History of thyroid  cancer    Hypertension    Hypothyroid    Hypothyroidism 02/19/2007   Qualifier: Diagnosis of  By: Manford Longs     IBS (irritable bowel syndrome) 12/27/2014   Nodule of right lung 08/29/2017   3 mm stable nodule right upper lobe on CTA Chest 08/28/17   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 08/29/2017   Subscapular pain, left 08/29/2017    Assessment: Patient Reported Symptoms:  Cognitive Cognitive Status: Able to follow simple commands, Alert and oriented to person, place, and time, Normal speech and language skills      Neurological Neurological Review of Symptoms: No symptoms reported    HEENT HEENT Symptoms Reported: Tinnitus (ringing in the right ear and fluid  in the left) HEENT Management Strategies: Medication therapy    Cardiovascular Cardiovascular Symptoms Reported: No symptoms reported    Respiratory Respiratory Symptoms Reported: No symptoms reported    Endocrine Is patient  diabetic?: Yes Is patient checking blood sugars at home?: Yes List most recent blood sugar readings, include date and time of day: 160 this morning    Gastrointestinal Gastrointestinal Symptoms Reported: Diarrhea, Cramping Additional Gastrointestinal Details: only happens when she has diverticulitis Gastrointestinal Management Strategies: Diet modification    Genitourinary Genitourinary Symptoms Reported: No symptoms reported    Integumentary Integumentary Symptoms Reported: No symptoms reported    Musculoskeletal Musculoskelatal Symptoms Reviewed: No symptoms reported   Falls in the past year?: No    Psychosocial       Quality of Family Relationships: supportive, involved Do you feel physically threatened by others?: No      07/14/2024    9:48 AM  Depression screen PHQ 2/9  Decreased Interest 0  Down, Depressed, Hopeless 0  PHQ - 2 Score 0    There were no vitals filed for this visit.  Medications Reviewed Today     Reviewed by Weyman Corning, RN (Registered Nurse) on 07/14/24 at [phone]  Med List Status: <None>   Medication Order Taking? Sig Documenting Provider Last Dose Status Informant  Accu-Chek FastClix Lancets MISC [id number] Yes USE 1 LANCET TO TEST THREE TIMES A DAY Anders Otto DASEN, MD  Active   ACCU-CHEK GUIDE TEST test strip [id number] Yes USE ONE TEST STRIP TO TEST THREE TIMES A DAY Anders Otto DASEN, MD  Active   aspirin  81 MG chewable tablet [id number] Yes Chew 81 mg by mouth daily.  [provider]  Active Self  atorvastatin  (LIPITOR) 20 MG tablet [id number] Yes Take 1 tablet (20 mg total) by mouth daily. Anders Otto DASEN, MD  Active   Blood Glucose Monitoring Suppl (ACCU-CHEK GUIDE) w/Device KIT [id number] Yes USE AS DIRECTED IN THE MORNING, AT NOON AND AT BEDTIME Strength: w/Device Anders Otto DASEN, MD  Active   carvedilol  (COREG ) 25 MG tablet [id number] Yes TAKE 1 TABLET BY MOUTH TWICE A DAY WITH A MEAL Eniola, Kehinde T, MD  Active   cetirizine  (ZYRTEC ) 5  MG tablet [id number] Yes Take 1 tablet (5 mg total) by mouth daily. Anders Otto DASEN, MD  Active            Med Note MAUDIE, MAUDE KANDICE Schaumann Jan 08, 2024  8:37 AM) prn  ferrous sulfate  325 (65 FE) MG tablet [id number] Yes Take 325 mg by mouth daily with breakfast. [provider]  Active Self  fluticasone  (FLONASE ) 50 MCG/ACT nasal spray [id number] Yes Place 2 sprays into both nostrils daily. Anders Otto DASEN, MD  Active            Med Note MAUDIE, MAUDE KANDICE Schaumann Jan 08, 2024  8:38 AM) prn  insulin  glargine (LANTUS ) 100 UNIT/ML Solostar Pen [id number] Yes Inject 5 Units into the skin daily. Anders Otto DASEN, MD  Active   Insulin  Pen Needle 31G X 6 MM MISC [id number] Yes Use daily to inject insulin  as prescribed Anders Otto T, MD  Active   Insulin  Syringe-Needle U-100 (B-D INS SYR ULTRAFINE .3CC/31G) 31G X 5/16 0.3 ML MISC [id number] Yes Use to inject insulin  daily Anders Otto T, MD  Active   isosorbide  mononitrate (IMDUR ) 60 MG 24 hr tablet [id number] Yes TAKE 1 AND 1/2 TABLET BY MOUTH DAILY Nishan, Peter C, MD  Active   JARDIANCE  25 MG TABS tablet [id number] Yes TAKE 1 TABLET BY MOUTH DAILY Anders Otto T, MD  Active   lisinopril -hydrochlorothiazide  (ZESTORETIC ) 20-12.5 MG tablet [id number] Yes TAKE 1 TABLET BY MOUTH DAILY Anders Otto DASEN, MD  Active   metFORMIN  (GLUCOPHAGE -XR) 500 MG 24 hr tablet [id number] Yes Take 2 tablets (1,000 mg total) by mouth daily with breakfast. Anders Otto DASEN, MD  Active   Multiple Vitamin (MULTIVITAMIN WITH MINERALS) TABS tablet [id number] Yes Take 1 tablet by mouth daily. Reported on 04/02/2016 [provider]  Active Self  nitroGLYCERIN  (NITROSTAT ) 0.4 MG SL tablet [id number] Yes Place 1 tablet (0.4 mg total) under the tongue every 5 (five) minutes as needed for chest pain. Delford MAUDE BROCKS, MD  Active             Recommendation:   Continue Current Plan of Care  Follow Up Plan:   Telephone follow up appointment with care management  team member scheduled for:  08/16/24 930 am with Olam Ku RNCM.    Wilbert Diver RN, BSN, [facility]  [facility], [facility]    Care Coordinator Phone: [phone]

## 2024-07-15 ENCOUNTER — Other Ambulatory Visit: Payer: Self-pay | Admitting: Family Medicine

## 2024-07-20 ENCOUNTER — Encounter: Payer: Self-pay | Admitting: Family Medicine

## 2024-07-20 ENCOUNTER — Ambulatory Visit: Admitting: Family Medicine

## 2024-07-20 VITALS — BP 158/64 | HR 73 | Ht 62.0 in | Wt 134.4 lb

## 2024-07-20 DIAGNOSIS — E7800 Pure hypercholesterolemia, unspecified: Secondary | ICD-10-CM

## 2024-07-20 DIAGNOSIS — H9311 Tinnitus, right ear: Secondary | ICD-10-CM | POA: Diagnosis not present

## 2024-07-20 DIAGNOSIS — Z794 Long term (current) use of insulin: Secondary | ICD-10-CM

## 2024-07-20 DIAGNOSIS — H6522 Chronic serous otitis media, left ear: Secondary | ICD-10-CM | POA: Diagnosis not present

## 2024-07-20 DIAGNOSIS — H6590 Unspecified nonsuppurative otitis media, unspecified ear: Secondary | ICD-10-CM | POA: Insufficient documentation

## 2024-07-20 DIAGNOSIS — I10 Essential (primary) hypertension: Secondary | ICD-10-CM | POA: Diagnosis not present

## 2024-07-20 DIAGNOSIS — E1121 Type 2 diabetes mellitus with diabetic nephropathy: Secondary | ICD-10-CM

## 2024-07-20 DIAGNOSIS — E1169 Type 2 diabetes mellitus with other specified complication: Secondary | ICD-10-CM

## 2024-07-20 LAB — POCT GLYCOSYLATED HEMOGLOBIN (HGB A1C): HbA1c, POC (controlled diabetic range): 7.4 % — AB (ref 0.0–7.0)

## 2024-07-20 NOTE — Assessment & Plan Note (Addendum)
 ype 2 diabetes mellitus with recent A1c of 7.4, slightly increased from previous 7.1. Blood glucose levels at home range from 119 to 133 mg/dL. Current management includes Jardiance , metformin , and insulin . - Continue Jardiance  25 mg daily. - Continue metformin  1000 mg daily. - Continue insulin  5 units daily. - Order A1c in 3-4 months.

## 2024-07-20 NOTE — Progress Notes (Deleted)
 DM 133 130/60

## 2024-07-20 NOTE — Assessment & Plan Note (Addendum)
 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 Previous ENT evaluation did not provide a solution. She is considering lipoflavonoid supplement. Discussed potential side effects of lipoflavonoid, including headaches and burning sensation in palms and legs. - Advise to monitor for side effects and discontinue if they occur.

## 2024-07-20 NOTE — Patient Instructions (Signed)
 Hypertension, Adult Hypertension is another name for high blood pressure. High blood pressure forces your heart to work harder to pump blood. This can cause problems over time. There are two numbers in a blood pressure reading. There is a top number (systolic) over a bottom number (diastolic). It is best to have a blood pressure that is below 120/80. What are the causes? The cause of this condition is not known. Some other conditions can lead to high blood pressure. What increases the risk? Some lifestyle factors can make you more likely to develop high blood pressure: Smoking. Not getting enough exercise or physical activity. Being overweight. Having too much fat, sugar, calories, or salt (sodium) in your diet. Drinking too much alcohol. Other risk factors include: Having any of these conditions: Heart disease. Diabetes. High cholesterol. Kidney disease. Obstructive sleep apnea. Having a family history of high blood pressure and high cholesterol. Age. The risk increases with age. Stress. What are the signs or symptoms? High blood pressure may not cause symptoms. Very high blood pressure (hypertensive crisis) may cause: Headache. Fast or uneven heartbeats (palpitations). Shortness of breath. Nosebleed. Vomiting or feeling like you may vomit (nauseous). Changes in how you see. Very bad chest pain. Feeling dizzy. Seizures. How is this treated? This condition is treated by making healthy lifestyle changes, such as: Eating healthy foods. Exercising more. Drinking less alcohol. Your doctor may prescribe medicine if lifestyle changes do not help enough and if: Your top number is above 130. Your bottom number is above 80. Your personal target blood pressure may vary. Follow these instructions at home: Eating and drinking  If told, follow the DASH eating plan. To follow this plan: Fill one half of your plate at each meal with fruits and vegetables. Fill one fourth of your plate  at each meal with whole grains. Whole grains include whole-wheat pasta, brown rice, and whole-grain bread. Eat or drink low-fat dairy products, such as skim milk or low-fat yogurt. Fill one fourth of your plate at each meal with low-fat (lean) proteins. Low-fat proteins include fish, chicken without skin, eggs, beans, and tofu. Avoid fatty meat, cured and processed meat, or chicken with skin. Avoid pre-made or processed food. Limit the amount of salt in your diet to less than 1,500 mg each day. Do not drink alcohol if: Your doctor tells you not to drink. You are pregnant, may be pregnant, or are planning to become pregnant. If you drink alcohol: Limit how much you have to: 0-1 drink a day for women. 0-2 drinks a day for men. Know how much alcohol is in your drink. In the [place] one drink equals one 12 oz bottle of beer (355 mL), one 5 oz glass of wine (148 mL), or one 1 oz glass of hard liquor (44 mL). Lifestyle  Work with your doctor to stay at a healthy weight or to lose weight. Ask your doctor what the best weight is for you. Get at least 30 minutes of exercise that causes your heart to beat faster (aerobic exercise) most days of the week. This may include walking, swimming, or biking. Get at least 30 minutes of exercise that strengthens your muscles (resistance exercise) at least 3 days a week. This may include lifting weights or doing Pilates. Do not smoke or use any products that contain nicotine or tobacco. If you need help quitting, ask your doctor. Check your blood pressure at home as told by your doctor. Keep all follow-up visits. Medicines Take over-the-counter and prescription medicines  only as told by your doctor. Follow directions carefully. Do not skip doses of blood pressure medicine. The medicine does not work as well if you skip doses. Skipping doses also puts you at risk for problems. Ask your doctor about side effects or reactions to medicines that you should watch  for. Contact a doctor if: You think you are having a reaction to the medicine you are taking. You have headaches that keep coming back. You feel dizzy. You have swelling in your ankles. You have trouble with your vision. Get help right away if: You get a very bad headache. You start to feel mixed up (confused). You feel weak or numb. You feel faint. You have very bad pain in your: Chest. Belly (abdomen). You vomit more than once. You have trouble breathing. These symptoms may be an emergency. Get help right away. Call 911. Do not wait to see if the symptoms will go away. Do not drive yourself to the hospital. Summary Hypertension is another name for high blood pressure. High blood pressure forces your heart to work harder to pump blood. For most people, a normal blood pressure is less than 120/80. Making healthy choices can help lower blood pressure. If your blood pressure does not get lower with healthy choices, you may need to take medicine. This information is not intended to replace advice given to you by your health care provider. Make sure you discuss any questions you have with your health care provider. Document Revised: 09/27/2021 Document Reviewed: 09/27/2021 Elsevier Patient Education  2024 [employer].

## 2024-07-20 NOTE — Assessment & Plan Note (Addendum)
 Hyperlipidemia managed with Lipitor 20 mg daily. Last cholesterol check was in August of the previous year. - Order cholesterol panel.

## 2024-07-20 NOTE — Assessment & Plan Note (Addendum)
 Serous otitis media in the right ear with fluid presence noted. She has been using Flonase  with reported improvement. - Continue Flonase  as needed for fluid management.

## 2024-07-20 NOTE — Progress Notes (Signed)
 [employer] documentation - verbal consent obtained from the patient. SUBJECTIVE:   CHIEF COMPLAINT / HPI:   History of Present Illness [name] is an 83 year old female with diabetes who presents for diabetes management and medication review.  She is currently managing her diabetes with Jardiance  25 mg daily, metformin  1000 mg daily, and insulin  5 units daily. She monitors her glucose at home, with recent readings of 133 mg/dL and 869 mg/dL. Her last A1c was 7.1, and today it is 7.4.  She has a history of hypertension and is taking Zestoretic  once daily. Her blood pressure today is 152/59 mmHg, and her home readings are around 130/60 mmHg. She recalls previously being on a higher dose of her blood pressure medication.  She experiences intermittent tinnitus in her right ear, occurring every other day and more noticeable in quiet environments. An ENT confirmed fluid in her ear. She uses Flonase  for symptom relief.  She is on Lipitor 20 mg daily for cholesterol management, with her last cholesterol check in August of the previous year. She also takes multivitamins regularly.  In terms of social history, she engages in physical activity by walking with her granddaughter and great-grandchildren. She reports no chest pain or difficulty breathing. She feels generally well.  PERTINENT  PMH / PSH: PMHx reviewed  OBJECTIVE:   BP (!) 158/64   Pulse 73   Ht 5' 2 (1.575 m)   Wt 134 lb 6.4 oz (61 kg)   LMP 11/24/1992   SpO2 100%   BMI 24.58 kg/m   Physical Exam Vitals and nursing note reviewed.  HENT:     Right Ear: Tympanic membrane and ear canal normal. There is no impacted cerumen.     Left Ear: Ear canal normal. There is no impacted cerumen.     Ears:     Comments: Small fluid behind left TM Eyes:     Pupils: Pupils are equal, round, and reactive to light.  Cardiovascular:     Rate and Rhythm: Normal rate and regular rhythm.     Heart sounds: Normal heart sounds. No  murmur heard. Pulmonary:     Effort: Pulmonary effort is normal. No respiratory distress.     Breath sounds: Normal breath sounds. No stridor. No wheezing.      ASSESSMENT/PLAN:   Assessment & Plan Controlled type 2 diabetes mellitus with diabetic nephropathy, without long-term current use of insulin  (HCC) ype 2 diabetes mellitus with recent A1c of 7.4, slightly increased from previous 7.1. Blood glucose levels at home range from 119 to 133 mg/dL. Current management includes Jardiance , metformin , and insulin . - Continue Jardiance  25 mg daily. - Continue metformin  1000 mg daily. - Continue insulin  5 units daily. - Order A1c in 3-4 months. Essential hypertension Essential hypertension with current in-office blood pressure reading of 158/64 mmHg, slightly elevated compared to home readings of approximately 130/60 mmHg. Current management includes Zestoretic . Potential need to increase medication if hypertension persists. - Recheck blood pressure in 4 weeks. - Consider increasing blood pressure medication if hypertension persists. - Bmet checked HYPERCHOLESTEROLEMIA Hyperlipidemia managed with Lipitor 20 mg daily. Last cholesterol check was in August of the previous year. - Order cholesterol panel. Tinnitus of right ear Tinnitus in the right ear, persistent and noticeable in quiet environments. Previous ENT evaluation did not provide a solution. She is considering lipoflavonoid supplement. Discussed potential side effects of lipoflavonoid, including headaches and burning sensation in palms and legs. - Advise to monitor for side effects and  discontinue if they occur. Left chronic serous otitis media Serous otitis media in the right ear with fluid presence noted. She has been using Flonase  with reported improvement. - Continue Flonase  as needed for fluid management.    Otto Fairly, MD [facility]

## 2024-07-20 NOTE — Assessment & Plan Note (Addendum)
 Essential hypertension with current in-office blood pressure reading of 158/64 mmHg, slightly elevated compared to home readings of approximately 130/60 mmHg. Current management includes Zestoretic . Potential need to increase medication if hypertension persists. - Recheck blood pressure in 4 weeks. - Consider increasing blood pressure medication if hypertension persists. - Bmet checked

## 2024-07-21 ENCOUNTER — Ambulatory Visit: Payer: Self-pay | Admitting: Family Medicine

## 2024-07-21 LAB — LIPID PANEL
Chol/HDL Ratio: 2.3 ratio (ref 0.0–4.4)
Cholesterol, Total: 115 mg/dL (ref 100–199)
HDL: 51 mg/dL (ref >39)
LDL Chol Calc (NIH): 47 mg/dL (ref 0–99)
Triglycerides: 87 mg/dL (ref 0–149)
VLDL Cholesterol Cal: 17 mg/dL (ref 5–40)

## 2024-07-21 LAB — BASIC METABOLIC PANEL WITH GFR
BUN/Creatinine Ratio: 15 (ref 12–28)
BUN: 21 mg/dL (ref 8–27)
CO2: 25 mmol/L (ref 20–29)
Calcium: 9.7 mg/dL (ref 8.7–10.3)
Chloride: 101 mmol/L (ref 96–106)
Creatinine, Ser: 1.37 mg/dL — ABNORMAL HIGH (ref 0.57–1.00)
Glucose: 251 mg/dL — ABNORMAL HIGH (ref 70–99)
Potassium: 4.5 mmol/L (ref 3.5–5.2)
Sodium: 141 mmol/L (ref 134–144)
eGFR: 39 mL/min/1.73 — ABNORMAL LOW (ref >59)

## 2024-07-21 NOTE — Telephone Encounter (Addendum)
 HIPAA compliant callback message left. Unable to reach via work phone number either.  Advise normal cholesterol test  Glucose elevated due to DM - recent A1C looks good - continue current DM regimen.  Kidney functions worsens a bit - improve hydration - follow up with her nephrology.

## 2024-07-22 NOTE — Telephone Encounter (Signed)
 Patient returns call to nurse line.  Results discussed with patient.   Encouraged hydration and Nephrology follow-up.   She agreed with plan.

## 2024-07-29 ENCOUNTER — Other Ambulatory Visit: Payer: Self-pay | Admitting: Family Medicine

## 2024-08-02 ENCOUNTER — Encounter: Payer: Self-pay | Admitting: Internal Medicine

## 2024-08-09 ENCOUNTER — Encounter: Payer: Self-pay | Admitting: *Deleted

## 2024-08-09 ENCOUNTER — Telehealth: Payer: Self-pay | Admitting: *Deleted

## 2024-08-09 NOTE — Patient Instructions (Signed)
 [name] - I am sorry I was unable to reach you today for our scheduled appointment. I work with Anders Otto DASEN, MD and am calling to support your healthcare needs. Please contact me at [phone] at your earliest convenience. I look forward to speaking with you soon.   Thank you,   Olam Ku, RN, BSN [place]  [facility], [facility] RN Care Manager Direct Dial: [phone]  Fax: [phone]

## 2024-08-10 ENCOUNTER — Ambulatory Visit: Admitting: Family Medicine

## 2024-08-12 ENCOUNTER — Ambulatory Visit: Payer: Medicare PPO | Admitting: Hematology and Oncology

## 2024-08-16 ENCOUNTER — Telehealth: Admitting: *Deleted

## 2024-08-17 NOTE — Assessment & Plan Note (Signed)
 History of diverticulitis.and  hemorrhoids.  Last colonoscopy was 07/02/2013    Work-up did not reveal thalassemia. IV iron treatment: March 2020   Lab review 01/19/2019: Hemoglobin 9.3, platelets 126 04/14/2020: Hemoglobin 9.9, platelets 131, MCV 89.7, B12 527, iron saturation 26%, ferritin 18, creatinine 1.04, calcium  11.3, folic acid  38.3  04/18/2020: SPEP: No M protein, kappa 32.4, ratio 1.43 erythropoietin  68.1  08/07/2020: Hemoglobin 9.4, MCV 87.9, platelets 139, creatinine 1.11, calcium  10.6, ferritin 30, iron saturation 23% 08/03/21: Hb 9.6, MCV: 91, Cr: 1.38, Ferritin:10, Iron Sat: 26%,  08/06/2022: Hemoglobin 10.1, MCV 90.9, platelets 143, iron saturation 28%, ferritin 16, creatinine 1.11 08/11/2023: Hemoglobin 10.2, MCV 92.3, platelets 106, iron saturation 31%, ferritin 13, creatinine 1.28 07/20/2024: Creatinine 1.37, hemoglobin A1c 7.4   Possible cause is anemia of chronic kidney disease.   Given the fact that the hemoglobin continues to remain stable and she is not symptomatic we will continue to watch and monitor.   Recheck labs in 1 year and follow-up.

## 2024-08-18 ENCOUNTER — Inpatient Hospital Stay: Attending: Hematology and Oncology | Admitting: Hematology and Oncology

## 2024-08-18 ENCOUNTER — Inpatient Hospital Stay

## 2024-08-18 VITALS — BP 116/60 | HR 77 | Temp 98.0°F | Resp 16 | Ht 62.0 in | Wt 132.7 lb

## 2024-08-18 DIAGNOSIS — D638 Anemia in other chronic diseases classified elsewhere: Secondary | ICD-10-CM | POA: Diagnosis not present

## 2024-08-18 DIAGNOSIS — D649 Anemia, unspecified: Secondary | ICD-10-CM | POA: Insufficient documentation

## 2024-08-18 LAB — CBC WITH DIFFERENTIAL (CANCER CENTER ONLY)
Abs Immature Granulocytes: 0.01 K/uL (ref 0.00–0.07)
Basophils Absolute: 0.1 K/uL (ref 0.0–0.1)
Basophils Relative: 2 %
Eosinophils Absolute: 0.2 K/uL (ref 0.0–0.5)
Eosinophils Relative: 5 %
HCT: 31.9 % — ABNORMAL LOW (ref 36.0–46.0)
Hemoglobin: 9.7 g/dL — ABNORMAL LOW (ref 12.0–15.0)
Immature Granulocytes: 0 %
Lymphocytes Relative: 32 %
Lymphs Abs: 1.4 K/uL (ref 0.7–4.0)
MCH: 27.8 pg (ref 26.0–34.0)
MCHC: 30.4 g/dL (ref 30.0–36.0)
MCV: 91.4 fL (ref 80.0–100.0)
Monocytes Absolute: 0.5 K/uL (ref 0.1–1.0)
Monocytes Relative: 12 %
Neutro Abs: 2.2 K/uL (ref 1.7–7.7)
Neutrophils Relative %: 49 %
Platelet Count: 111 K/uL — ABNORMAL LOW (ref 150–400)
RBC: 3.49 MIL/uL — ABNORMAL LOW (ref 3.87–5.11)
RDW: 16.5 % — ABNORMAL HIGH (ref 11.5–15.5)
WBC Count: 4.5 K/uL (ref 4.0–10.5)
nRBC: 0 % (ref 0.0–0.2)

## 2024-08-18 LAB — IRON AND IRON BINDING CAPACITY (CC-WL,HP ONLY)
Iron: 125 ug/dL (ref 28–170)
Saturation Ratios: 37 % — ABNORMAL HIGH (ref 10.4–31.8)
TIBC: 339 ug/dL (ref 250–450)
UIBC: 214 ug/dL (ref 148–442)

## 2024-08-18 LAB — FERRITIN: Ferritin: 27 ng/mL (ref 11–307)

## 2024-08-18 LAB — VITAMIN B12: Vitamin B-12: 955 pg/mL — ABNORMAL HIGH (ref 180–914)

## 2024-08-18 NOTE — Progress Notes (Signed)
 "  Patient Care Team: Anders Otto DASEN, MD as PCP - General (Family Medicine) Delford Maude BROCKS, MD as PCP - Cardiology (Cardiology) Jannis Kate Norris, MD as Consulting Physician (Obstetrics and Gynecology) [place], [facility] Odean Potts, MD as Consulting Physician (Hematology and Oncology) Alvia Olam BIRCH, RN as [facility] Care Management  DIAGNOSIS:  Encounter Diagnosis  Name Primary?   Anemia of chronic disease Yes      CHIEF COMPLIANT: Follow-up of anemia of chronic disease  HISTORY OF PRESENT ILLNESS:   History of Present Illness An 83 year old female with anemia who presents for a follow-up visit.  She experiences fatigue. There is no shortness of breath or palpitations.  Her last blood work on March 13th showed good iron levels and parathyroid hormone (PTH), but kidney function was not within the normal range. Hemoglobin levels were not checked at that time, and no recent blood work has been conducted since then. An appointment for blood work was scheduled prior to this visit but was canceled, and she was not rescheduled for the lab work before this follow-up.     ALLERGIES:  is allergic to codeine, sulfamethoxazole-trimethoprim, and tramadol .  MEDICATIONS:  Current Outpatient Medications  Medication Sig Dispense Refill   Accu-Chek FastClix Lancets MISC USE 1 LANCET TO TEST THREE TIMES A DAY 102 each 4   ACCU-CHEK GUIDE TEST test strip USE ONE TEST STRIP TO TEST THREE TIMES A DAY 100 strip 5   aspirin  81 MG chewable tablet Chew 81 mg by mouth daily.     atorvastatin  (LIPITOR) 20 MG tablet Take 1 tablet (20 mg total) by mouth daily. 90 tablet 1   Blood Glucose Monitoring Suppl (ACCU-CHEK GUIDE) w/Device KIT USE AS DIRECTED IN THE MORNING, AT NOON AND AT BEDTIME Strength: w/Device 1 kit 0   carvedilol  (COREG ) 25 MG tablet TAKE 1 TABLET BY MOUTH TWICE A DAY WITH A MEAL 180 tablet 1   cetirizine  (ZYRTEC ) 5 MG tablet Take 1 tablet (5 mg total) by mouth daily. 30 tablet 1    ferrous sulfate  325 (65 FE) MG tablet Take 325 mg by mouth daily with breakfast.     fluticasone  (FLONASE ) 50 MCG/ACT nasal spray Place 2 sprays into both nostrils daily. 16 g 1   insulin  glargine (LANTUS  SOLOSTAR) 100 UNIT/ML Solostar Pen INJECT 5 UNITS UNDER THE SKIN ONCE DAILY 15 mL 1   Insulin  Pen Needle 31G X 6 MM MISC Use daily to inject insulin  as prescribed 100 each 1   Insulin  Syringe-Needle U-100 (B-D INS SYR ULTRAFINE .3CC/31G) 31G X 5/16 0.3 ML MISC Use to inject insulin  daily 100 each 6   isosorbide  mononitrate (IMDUR ) 60 MG 24 hr tablet TAKE 1 AND 1/2 TABLET BY MOUTH DAILY 135 tablet 3   JARDIANCE  25 MG TABS tablet TAKE 1 TABLET BY MOUTH DAILY 90 tablet 1   lisinopril -hydrochlorothiazide  (ZESTORETIC ) 20-12.5 MG tablet TAKE 1 TABLET BY MOUTH DAILY 90 tablet 1   metFORMIN  (GLUCOPHAGE -XR) 500 MG 24 hr tablet Take 2 tablets (1,000 mg total) by mouth daily with breakfast. 180 tablet 1   Multiple Vitamin (MULTIVITAMIN WITH MINERALS) TABS tablet Take 1 tablet by mouth daily. Reported on 04/02/2016     nitroGLYCERIN  (NITROSTAT ) 0.4 MG SL tablet Place 1 tablet (0.4 mg total) under the tongue every 5 (five) minutes as needed for chest pain. 25 tablet 3   No current facility-administered medications for this visit.    PHYSICAL EXAMINATION: ECOG PERFORMANCE STATUS: 1 - Symptomatic but completely  ambulatory  Vitals:   08/18/24 0921  BP: 116/60  Pulse: 77  Resp: 16  Temp: 98 F (36.7 C)  SpO2: (!) 9%   Filed Weights   08/18/24 0921  Weight: 132 lb 11.2 oz (60.2 kg)      LABORATORY DATA:  I have reviewed the data as listed    Latest Ref Rng & Units 07/20/2024   11:44 AM 08/11/2023    9:17 AM 08/06/2022    9:01 AM  CMP  Glucose 70 - 99 mg/dL 748  821  837   BUN 8 - 27 mg/dL 21  25  21    Creatinine 0.57 - 1.00 mg/dL 8.62  8.71  8.88   Sodium 134 - 144 mmol/L 141  143  142   Potassium 3.5 - 5.2 mmol/L 4.5  4.1  4.6   Chloride 96 - 106 mmol/L 101  105  106   CO2 20 - 29 mmol/L  25  31  30    Calcium  8.7 - 10.3 mg/dL 9.7  89.9  9.4   Total Protein 6.5 - 8.1 g/dL  7.1  6.9   Total Bilirubin 0.3 - 1.2 mg/dL  0.7  0.7   Alkaline Phos 38 - 126 U/L  70  69   AST 15 - 41 U/L  13  22   ALT 0 - 44 U/L  13  21     Lab Results  Component Value Date   WBC 4.5 08/18/2024   HGB 9.7 (L) 08/18/2024   HCT 31.9 (L) 08/18/2024   MCV 91.4 08/18/2024   PLT 111 (L) 08/18/2024   NEUTROABS 2.2 08/18/2024    ASSESSMENT & PLAN:  Anemia of chronic disease History of diverticulitis.and  hemorrhoids.  Last colonoscopy was 07/02/2013    Work-up did not reveal thalassemia. IV iron treatment: March 2020   Lab review 01/19/2019: Hemoglobin 9.3, platelets 126 04/14/2020: Hemoglobin 9.9, platelets 131, MCV 89.7, B12 527, iron saturation 26%, ferritin 18, creatinine 1.04, calcium  11.3, folic acid  38.3  04/18/2020: SPEP: No M protein, kappa 32.4, ratio 1.43 erythropoietin  68.1  08/07/2020: Hemoglobin 9.4, MCV 87.9, platelets 139, creatinine 1.11, calcium  10.6, ferritin 30, iron saturation 23% 08/03/21: Hb 9.6, MCV: 91, Cr: 1.38, Ferritin:10, Iron Sat: 26%,  08/06/2022: Hemoglobin 10.1, MCV 90.9, platelets 143, iron saturation 28%, ferritin 16, creatinine 1.11 08/11/2023: Hemoglobin 10.2, MCV 92.3, platelets 106, iron saturation 31%, ferritin 13, creatinine 1.28 07/20/2024: Creatinine 1.37, hemoglobin A1c 7.4 08/18/2024: Hemoglobin 9.7, MCV 91.4, platelets 111, iron saturation 37%, ferritin 27, B12 955   Possible cause is anemia of chronic kidney disease.  No indication for erythropoietin  stimulating therapy since she is asymptomatic and the hemoglobin is relatively stable   Given the fact that the hemoglobin continues to remain stable and she is not symptomatic we will continue to watch and monitor.   Recheck labs in 1 year and follow-up.    Orders Placed This Encounter  Procedures   CBC with Differential (Cancer Center Only)    Standing Status:   Future    Number of Occurrences:   1     Expiration Date:   08/18/2025   Ferritin    Standing Status:   Future    Number of Occurrences:   1    Expiration Date:   08/18/2025   Iron and Iron Binding Capacity ([facility] only)    Standing Status:   Future    Number of Occurrences:   1    Expiration Date:   08/18/2025  Vitamin B12    Standing Status:   Future    Number of Occurrences:   1    Expiration Date:   09/22/2025   The patient has a good understanding of the overall plan. she agrees with it. she will call with any problems that may develop before the next visit here. Total time spent: 30 mins including face to face time and time spent for planning, charting and co-ordination of care   Naomi MARLA Chad, MD 08/18/24    "

## 2024-08-19 ENCOUNTER — Encounter: Payer: Self-pay | Admitting: Internal Medicine

## 2024-08-26 ENCOUNTER — Other Ambulatory Visit: Payer: Self-pay | Admitting: *Deleted

## 2024-08-26 NOTE — Patient Instructions (Signed)
 Visit Information  Thank you for taking time to visit with me today. Please don't hesitate to contact me if I can be of assistance to you before our next scheduled appointment.  Your next care management appointment is by telephone on 09/22/2024 at 2:00 pm  Please call the care guide team at [phone] if you need to cancel, schedule, or reschedule an appointment.   Please call the Suicide and Crisis Lifeline: 988 call the [place]  National Suicide Prevention Lifeline: 919-093-7423 or TTY: 334-798-6393 TTY (212) 330-5210) to talk to a trained counselor call 1-800-273-TALK (toll free, 24 hour hotline) if you are experiencing a Mental Health or Behavioral Health Crisis or need someone to talk to.   Olam Ku, RN, BSN [place]  [facility], [facility] RN Care Manager Direct Dial: [phone]  Fax: [phone]

## 2024-08-26 NOTE — Patient Outreach (Signed)
 Complex Care Management   Visit Note  08/26/2024  Name:  [name] MRN: [id number] DOB: May 06, 1941  Situation: Referral received for Complex Care Management related to DMII/HTN I obtained verbal consent from Patient.  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 Remote PTCA of the PDA;  b. 02/2009 PCI/CBA to the D1;  c. 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 thyroid  lobectomy   History of thyroid  cancer    Hypertension    Hypothyroid    Hypothyroidism 02/19/2007   Qualifier: Diagnosis of  By: Manford Longs     IBS (irritable bowel syndrome) 12/27/2014   Nodule of right lung 08/29/2017   3 mm stable nodule right upper lobe on CTA Chest 08/28/17   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 08/29/2017   Subscapular pain, left 08/29/2017    Assessment: Patient Reported Symptoms:  Cognitive Cognitive Status: Alert and oriented to person, place, and time, Able to follow simple commands, Normal speech and language skills   Health Maintenance Behaviors: Annual physical exam  Neurological Neurological Review of Symptoms: No symptoms reported Neurological Management Strategies: Routine screening  HEENT HEENT Symptoms Reported: Tinnitus (RInging in ears MD aware with interventions to use nasal sprays.) HEENT Management Strategies: Medication therapy    Cardiovascular Cardiovascular Symptoms Reported: No symptoms  reported Does patient have uncontrolled Hypertension?: Yes Is patient checking Blood Pressure at home?: Yes Patient's Recent BP reading at home: 138/64 to 149/69 and remains asymptomatic Cardiovascular Management Strategies: Medication therapy, Routine screening, Adequate rest Weight: 132 lb (59.9 kg)  Respiratory Respiratory Symptoms Reported: No symptoms reported    Endocrine Endocrine Symptoms Reported: No symptoms reported Is patient diabetic?: Yes Is patient checking blood sugars at home?: Yes List most recent blood sugar readings, include date and time of day: average range 101-118 however recent fasting with a read of 144 this AM asymptomatic    Gastrointestinal Gastrointestinal Symptoms Reported: No symptoms reported      Genitourinary Genitourinary Symptoms Reported: No symptoms reported    Integumentary Integumentary Symptoms Reported: No symptoms reported    Musculoskeletal Musculoskelatal Symptoms Reviewed: No symptoms reported   Falls in the past year?: No Number of falls in past year: 1 or less Was there an injury with Fall?: No Fall Risk Category Calculator: 0 Patient Fall Risk Level: Low Fall Risk    Psychosocial Psychosocial Symptoms Reported: No symptoms reported          08/26/2024    PHQ2-9 Depression Screening   Little interest or pleasure in doing things Not at all  Feeling down, depressed, or hopeless Not at all  PHQ-2 - Total Score 0  Trouble falling or staying asleep, or sleeping too much    Feeling tired or having little energy    Poor appetite or overeating     Feeling bad about yourself - or  that you are a failure or have let yourself or your family down    Trouble concentrating on things, such as reading the newspaper or watching television    Moving or speaking so slowly that other people could have noticed.  Or the opposite - being so fidgety or restless that you have been moving around a lot more than usual    Thoughts that you would be  better off dead, or hurting yourself in some way    PHQ2-9 Total Score    If you checked off any problems, how difficult have these problems made it for you to do your work, take care of things at home, or get along with other people    Depression Interventions/Treatment      There were no vitals filed for this visit.  Medications Reviewed Today     Reviewed by Alvia Olam BIRCH, RN (Registered Nurse) on 08/26/24 at 1516  Med List Status: <None>   Medication Order Taking? Sig Documenting Provider Last Dose Status Informant  Accu-Chek FastClix Lancets MISC [id number] Yes USE 1 LANCET TO TEST THREE TIMES A DAY Anders Otto DASEN, MD  Active   ACCU-CHEK GUIDE TEST test strip [id number] Yes USE ONE TEST STRIP TO TEST THREE TIMES A DAY Anders Otto DASEN, MD  Active   aspirin  81 MG chewable tablet [id number] Yes Chew 81 mg by mouth daily. [provider]  Active Self  atorvastatin  (LIPITOR) 20 MG tablet [id number] Yes Take 1 tablet (20 mg total) by mouth daily. Anders Otto DASEN, MD  Active   Blood Glucose Monitoring Suppl (ACCU-CHEK GUIDE) w/Device KIT [id number] Yes USE AS DIRECTED IN THE MORNING, AT NOON AND AT BEDTIME Strength: w/Device Anders Otto DASEN, MD  Active   carvedilol  (COREG ) 25 MG tablet [id number] Yes TAKE 1 TABLET BY MOUTH TWICE A DAY WITH A MEAL Eniola, Kehinde T, MD  Active   cetirizine  (ZYRTEC ) 5 MG tablet [id number] Yes Take 1 tablet (5 mg total) by mouth daily. Anders Otto DASEN, MD  Active            Med Note MAUDIE, MAUDE KANDICE Schaumann Jan 08, 2024  8:37 AM) prn  ferrous sulfate  325 (65 FE) MG tablet [id number] Yes Take 325 mg by mouth daily with breakfast. [provider]  Active Self  fluticasone  (FLONASE ) 50 MCG/ACT nasal spray [id number] Yes Place 2 sprays into both nostrils daily. Anders Otto DASEN, MD  Active            Med Note MAUDIE, MAUDE KANDICE Schaumann Jan 08, 2024  8:38 AM) prn  insulin  glargine (LANTUS  SOLOSTAR) 100 UNIT/ML Solostar Pen [id number] Yes INJECT 5 UNITS  UNDER THE SKIN ONCE DAILY Anders Otto DASEN, MD  Active   Insulin  Pen Needle 31G X 6 MM MISC [id number] Yes Use daily to inject insulin  as prescribed Anders Otto T, MD  Active   Insulin  Syringe-Needle U-100 (B-D INS SYR ULTRAFINE .3CC/31G) 31G X 5/16 0.3 ML MISC [id number] Yes Use to inject insulin  daily Anders Otto T, MD  Active   isosorbide  mononitrate (IMDUR ) 60 MG 24 hr tablet [id number] Yes TAKE 1 AND 1/2 TABLET BY MOUTH DAILY Nishan, Peter C, MD  Active   JARDIANCE  25 MG TABS tablet [id number] Yes TAKE 1 TABLET BY MOUTH DAILY Anders Otto T, MD  Active   lisinopril -hydrochlorothiazide  (ZESTORETIC ) 20-12.5 MG tablet [id number] Yes TAKE 1 TABLET BY MOUTH DAILY Eniola, Kehinde T, MD  Active   metFORMIN  (GLUCOPHAGE -XR) 500 MG  24 hr tablet [id number] Yes Take 2 tablets (1,000 mg total) by mouth daily with breakfast. Anders Otto DASEN, MD  Active   Multiple Vitamin (MULTIVITAMIN WITH MINERALS) TABS tablet [id number] Yes Take 1 tablet by mouth daily. Reported on 04/02/2016 [provider]  Active Self  nitroGLYCERIN  (NITROSTAT ) 0.4 MG SL tablet [id number] Yes Place 1 tablet (0.4 mg total) under the tongue every 5 (five) minutes as needed for chest pain. Delford Maude BROCKS, MD  Active             Recommendation:   PCP Follow-up Continue Current Plan of Care  Follow Up Plan:   Telephone follow up appointment date/time:  09/22/2024 @ 2:00 pm   Olam Ku, RN, BSN [place]  [facility], [facility] RN Care Manager Direct Dial: [phone]  Fax: [phone]

## 2024-09-02 ENCOUNTER — Ambulatory Visit (INDEPENDENT_AMBULATORY_CARE_PROVIDER_SITE_OTHER): Payer: Medicare PPO

## 2024-09-02 VITALS — Ht 62.5 in | Wt 137.0 lb

## 2024-09-02 DIAGNOSIS — Z0000 Encounter for general adult medical examination without abnormal findings: Secondary | ICD-10-CM

## 2024-09-02 NOTE — Progress Notes (Signed)
 Because this visit was a virtual/telehealth visit,  certain criteria was not obtained, such a blood pressure, CBG if applicable, and timed get up and go. Any medications not marked as taking were not mentioned during the medication reconciliation part of the visit. Any vitals not documented were not able to be obtained due to this being a telehealth visit or patient was unable to self-report a recent blood pressure reading due to a lack of equipment at home via telehealth. Vitals that have been documented are verbally provided by the patient.   Subjective:   [name] is a 83 y.o. who presents for a Medicare Wellness preventive visit.  As a reminder, Annual Wellness Visits don't include a physical exam, and some assessments may be limited, especially if this visit is performed virtually. We may recommend an in-person follow-up visit with your provider if needed.  Visit Complete: Virtual I connected with  [name] on 09/02/24 by a audio enabled telemedicine application and verified that I am speaking with the correct person using two identifiers.  Patient Location: Home  Provider Location: Office/Clinic  I discussed the limitations of evaluation and management by telemedicine. The patient expressed understanding and agreed to proceed.  Vital Signs: Because this visit was a virtual/telehealth visit, some criteria may be missing or patient reported. Any vitals not documented were not able to be obtained and vitals that have been documented are patient reported.  VideoDeclined- This patient declined Librarian, academic. Therefore the visit was completed with audio only.  Persons Participating in Visit: Patient.  AWV Questionnaire: No: Patient Medicare AWV questionnaire was not completed prior to this visit.  Cardiac Risk Factors include: advanced age (>102men, >26 women);dyslipidemia;hypertension;diabetes mellitus;family history of premature  cardiovascular disease (Works in a daycare)     Objective:    Today's Vitals   09/02/24 1042  Weight: 137 lb (62.1 kg)  Height: 5' 2.5 (1.588 m)  PainSc: 0-No pain   Body mass index is 24.66 kg/m.     09/02/2024   10:44 AM 08/26/2024    3:30 PM 08/18/2024    9:20 AM 07/20/2024    9:53 AM 05/10/2024    1:56 PM 01/02/2024    9:51 AM 12/02/2023    9:41 AM  Advanced Directives  Does Patient Have a Medical Advance Directive? No No No No No No No  Would patient like information on creating a medical advance directive? No - Patient declined Yes (ED - Information included in AVS) No - Patient declined No - Patient declined No - Patient declined No - Patient declined     Current Medications (verified) Outpatient Encounter Medications as of 09/02/2024  Medication Sig   Accu-Chek FastClix Lancets MISC USE 1 LANCET TO TEST THREE TIMES A DAY   ACCU-CHEK GUIDE TEST test strip USE ONE TEST STRIP TO TEST THREE TIMES A DAY   aspirin  81 MG chewable tablet Chew 81 mg by mouth daily.   atorvastatin  (LIPITOR) 20 MG tablet Take 1 tablet (20 mg total) by mouth daily.   Blood Glucose Monitoring Suppl (ACCU-CHEK GUIDE) w/Device KIT USE AS DIRECTED IN THE MORNING, AT NOON AND AT BEDTIME Strength: w/Device   carvedilol  (COREG ) 25 MG tablet TAKE 1 TABLET BY MOUTH TWICE A DAY WITH A MEAL   cetirizine  (ZYRTEC ) 5 MG tablet Take 1 tablet (5 mg total) by mouth daily.   ferrous sulfate  325 (65 FE) MG tablet Take 325 mg by mouth daily with breakfast.   fluticasone  (FLONASE )  50 MCG/ACT nasal spray Place 2 sprays into both nostrils daily.   insulin  glargine (LANTUS  SOLOSTAR) 100 UNIT/ML Solostar Pen INJECT 5 UNITS UNDER THE SKIN ONCE DAILY   Insulin  Pen Needle 31G X 6 MM MISC Use daily to inject insulin  as prescribed   Insulin  Syringe-Needle U-100 (B-D INS SYR ULTRAFINE .3CC/31G) 31G X 5/16 0.3 ML MISC Use to inject insulin  daily   isosorbide  mononitrate (IMDUR ) 60 MG 24 hr tablet TAKE 1 AND 1/2 TABLET BY MOUTH  DAILY   JARDIANCE  25 MG TABS tablet TAKE 1 TABLET BY MOUTH DAILY   lisinopril -hydrochlorothiazide  (ZESTORETIC ) 20-12.5 MG tablet TAKE 1 TABLET BY MOUTH DAILY   metFORMIN  (GLUCOPHAGE -XR) 500 MG 24 hr tablet Take 2 tablets (1,000 mg total) by mouth daily with breakfast.   Multiple Vitamin (MULTIVITAMIN WITH MINERALS) TABS tablet Take 1 tablet by mouth daily. Reported on 04/02/2016   nitroGLYCERIN  (NITROSTAT ) 0.4 MG SL tablet Place 1 tablet (0.4 mg total) under the tongue every 5 (five) minutes as needed for chest pain.   No facility-administered encounter medications on file as of 09/02/2024.    Allergies (verified) Codeine, Sulfamethoxazole-trimethoprim, and Tramadol    History: Past Medical History:  Diagnosis Date   Anemia    Aortic arch atherosclerosis (HCC) 08/29/2017   Cancer (HCC)    Carpal tunnel syndrome 02/19/2007   Qualifier: Diagnosis of  By: Manford Longs     Coronary artery disease    a. Remote PTCA of the PDA;  b. 02/2009 PCI/CBA to the D1;  c. 11/2011 Cath: LM nl, LAD 20p, 100m, D1 patent, RI 20p, LCX nl, RCA 30d, EF 55-65%.// Myoview  04/2020: EF 69, no ischemia or infarction, low risk    CTS (carpal tunnel syndrome)    Diabetes mellitus without complication (HCC)    Dysphagia 11/13/2018   GERD (gastroesophageal reflux disease)    History of papillary adenocarcinoma of thyroid  1992   right thyroid  lobectomy   History of thyroid  cancer    Hypertension    Hypothyroid    Hypothyroidism 02/19/2007   Qualifier: Diagnosis of  By: Manford Longs     IBS (irritable bowel syndrome) 12/27/2014   Nodule of right lung 08/29/2017   3 mm stable nodule right upper lobe on CTA Chest 08/28/17   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 08/29/2017   Subscapular pain, left 08/29/2017   Past Surgical History:  Procedure Laterality Date   ANGIOPLASTY     PDA 90-40 %   BARTHOLIN GLAND CYST EXCISION  09-13-2004   Keratosis- no CA   BREAST BIOPSY     LEFT SPOT REMOVED   BREAST  EXCISIONAL BIOPSY Left    CARDIAC CATHETERIZATION  03/15/2009   stenosis of diagonal branch of LAD- PTCA performed   CHOLECYSTECTOMY  08-23-2002   CORONARY ANGIOPLASTY     ENDOMETRIAL BIOPSY  02-20-1998   LEFT HEART CATHETERIZATION WITH CORONARY ANGIOGRAM N/A 11/26/2011   Procedure: LEFT HEART CATHETERIZATION WITH CORONARY ANGIOGRAM;  Surgeon: Peter M Swaziland, MD;  Location: [facility] CATH LAB;  Service: Cardiovascular;  Laterality: N/A;   PTCA  10/23/2000 and 03/15/09   THYROIDECTOMY Right 10-06-1991   Hashimotos and papillary carcinoma   TONSILLECTOMY     TUBAL LIGATION     Family History  Problem Relation Age of Onset   Diabetes Mother        Deceased- hypoglycemic coma    Hypertension Mother    Other Father        unaware of his health history - died  when pt was 83 yr old.   Heart attack Sister    Diabetes Mellitus II Brother    Heart attack Brother    Heart attack Brother    Diabetes Mellitus II Brother    Epilepsy Brother    Breast cancer Daughter 65   Cancer Granddaughter    Social History   Socioeconomic History   Marital status: Widowed    Spouse name: Not on file   Number of children: 6   Years of education: 10   Highest education level: Not on file  Occupational History   Occupation: Housekeeper    Employer: [employer]  Tobacco Use   Smoking status: Former    Current packs/day: 0.00    Types: Cigarettes    Quit date: 12/23/1980    Years since quitting: 43.7   Smokeless tobacco: Former    Types: Chew    Quit date: 08/23/1994  Vaping Use   Vaping status: Never Used  Substance and Sexual Activity   Alcohol use: No   Drug use: No   Sexual activity: Not Currently    Birth control/protection: Surgical  Other Topics Concern   Not on file  Social History Narrative   Lives in [place] with Granddaughter.  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  She is not routinely exercising.   Has her GED   Social Drivers of Corporate investment banker Strain: Low Risk   (09/02/2024)   Overall Financial Resource Strain (CARDIA)    Difficulty of Paying Living Expenses: Not hard at all  Food Insecurity: No Food Insecurity (09/02/2024)   Hunger Vital Sign    Worried About Running Out of Food in the Last Year: Never true    Ran Out of Food in the Last Year: Never true  Transportation Needs: No Transportation Needs (09/02/2024)   PRAPARE - Administrator, Civil Service (Medical): No    Lack of Transportation (Non-Medical): No  Physical Activity: Sufficiently Active (09/02/2024)   Exercise Vital Sign    Days of Exercise per Week: 5 days    Minutes of Exercise per Session: 30 min  Stress: No Stress Concern Present (09/02/2024)   [employer] of Occupational Health - Occupational Stress Questionnaire    Feeling of Stress: Not at all  Social Connections: Moderately Isolated (09/02/2024)   Social Connection and Isolation Panel    Frequency of Communication with Friends and Family: More than three times a week    Frequency of Social Gatherings with Friends and Family: Once a week    Attends Religious Services: 1 to 4 times per year    Active Member of [employer] or Organizations: No    Attends Banker Meetings: Never    Marital Status: Widowed    Tobacco Counseling Counseling given: Not Answered    Clinical Intake:  Pre-visit preparation completed: Yes  Pain : No/denies pain Pain Score: 0-No pain     BMI - recorded: 24.66 Nutritional Status: BMI of 19-24  Normal Nutritional Risks: None Diabetes: Yes CBG done?: No Did pt. bring in CBG monitor from home?: No  Lab Results  Component Value Date   HGBA1C 7.4 (A) 07/20/2024   HGBA1C 7.1 (A) 12/02/2023   HGBA1C 6.9 02/28/2023     How often do you need to have someone help you when you read instructions, pamphlets, or other written materials from your doctor or pharmacy?: 1 - Never What is the last grade level you completed in  school?: GED  Interpreter Needed?:  No  Information entered by :: Bethanne Mule N. Kareem Aul, LPN.   Activities of Daily Living     09/02/2024   10:45 AM  In your present state of health, do you have any difficulty performing the following activities:  Hearing? 0  Vision? 0  Difficulty concentrating or making decisions? 0  Walking or climbing stairs? 0  Dressing or bathing? 0  Doing errands, shopping? 0  Preparing Food and eating ? N  Using the Toilet? N  In the past six months, have you accidently leaked urine? N  Do you have problems with loss of bowel control? N  Managing your Medications? N  Managing your Finances? N  Housekeeping or managing your Housekeeping? N    Patient Care Team: Anders Otto DASEN, MD as PCP - General (Family Medicine) Delford Maude BROCKS, MD as PCP - Cardiology (Cardiology) Jannis Kate Norris, MD as Consulting Physician (Obstetrics and Gynecology) Odean Potts, MD as Consulting Physician (Hematology and Oncology) Alvia Olam BIRCH, RN as [facility] Care Management Suzen Porch, OD. as Consulting Physician (Optometry)  I have updated your Care Teams any recent Medical Services you may have received from other providers in the past year.     Assessment:   This is a routine wellness examination for [name].  Hearing/Vision screen Hearing Screening - Comments:: Denies hearing difficulties.  Vision Screening - Comments:: Wears rx glasses - up to date with routine eye exams with Dr. Suzen Porch.    Goals Addressed             This Visit's Progress    Patient Stated       09/02/2024: To stay healthy and alive.       Depression Screen     09/02/2024   10:46 AM 08/26/2024    3:29 PM 07/20/2024    9:43 AM 07/14/2024    9:48 AM 05/18/2024   11:33 AM 05/10/2024    1:55 PM 01/02/2024    9:49 AM  PHQ 2/9 Scores  PHQ - 2 Score 0 0 0 0 0 0 0  PHQ- 9 Score 0  0   0 0    Fall Risk     09/02/2024   10:44 AM 08/26/2024    3:29 PM 07/20/2024    9:43 AM 07/14/2024    9:47 AM 05/10/2024    1:55 PM   Fall Risk   Falls in the past year? 0 0 0 0 0  Number falls in past yr: 0 0 0  0  Injury with Fall? 0 0 0  0  Risk for fall due to : No Fall Risks      Follow up Falls evaluation completed        MEDICARE RISK AT HOME:  Medicare Risk at Home Any stairs in or around the home?: No If so, are there any without handrails?: No Home free of loose throw rugs in walkways, pet beds, electrical cords, etc?: Yes Adequate lighting in your home to reduce risk of falls?: Yes Life alert?: No Use of a cane, walker or w/c?: No Grab bars in the bathroom?: No Shower chair or bench in shower?: No Elevated toilet seat or a handicapped toilet?: No  TIMED UP AND GO:  Was the test performed?  No  Cognitive Function: Declined/Normal: No cognitive concerns noted by patient or family. Patient alert, oriented, able to answer questions appropriately and recall recent events. No signs of memory loss or confusion.    09/02/2024  10:46 AM  MMSE - Mini Mental State Exam  Not completed: Unable to complete        09/02/2024   10:49 AM 09/01/2023   10:42 AM  6CIT Screen  What Year? 0 points 0 points  What month? 0 points 0 points  What time? 0 points 0 points  Count back from 20 0 points 0 points  Months in reverse 0 points 0 points  Repeat phrase 0 points 0 points  Total Score 0 points 0 points    Immunizations Immunization History  Administered Date(s) Administered   Fluad Quad(high Dose 65+) 10/20/2020, 11/02/2021, 10/29/2022   Influenza Split 09/15/2012   Influenza Whole 10/11/2008, 09/28/2009, 10/22/2010, 09/04/2011   Influenza,inj,Quad PF,6+ Mos 10/15/2013, 10/14/2014, 11/28/2015, 11/05/2016, 09/30/2017, 09/29/2018, 10/19/2019   Pneumococcal Conjugate-13 11/28/2015   Pneumococcal Polysaccharide-23 06/22/1994, 11/26/2011   Td 06/27/2005   Tdap 11/07/2016   Zoster, Live 08/22/2015    Screening Tests Health Maintenance  Topic Date Due   Zoster Vaccines- Shingrix (1 of 2) 12/01/1992    COVID-19 Vaccine (1 - 2024-25 season) Never done   Influenza Vaccine  09/21/2024 (Originally 07/23/2024)   HEMOGLOBIN A1C  10/20/2024   FOOT EXAM  01/01/2025   Diabetic kidney evaluation - Urine ACR  03/04/2025   OPHTHALMOLOGY EXAM  03/11/2025   Diabetic kidney evaluation - eGFR measurement  07/20/2025   Medicare Annual Wellness (AWV)  09/02/2025   DTaP/Tdap/Td (3 - Td or Tdap) 11/07/2026   Pneumococcal Vaccine: 50+ Years  Completed   DEXA SCAN  Completed   HPV VACCINES  Aged Out   Meningococcal B Vaccine  Aged Out   Hepatitis C Screening  Discontinued    Health Maintenance Items Addressed: Yes Patient aware of current care gaps.  Patient declined vaccines.  Additional Screening:  Vision Screening: Recommended annual ophthalmology exams for early detection of glaucoma and other disorders of the eye. Is the patient up to date with their annual eye exam?  Yes  Who is the provider or what is the name of the office in which the patient attends annual eye exams? Suzen Porch, OD.  Dental Screening: Recommended annual dental exams for proper oral hygiene  Community Resource Referral / Chronic Care Management: CRR required this visit?  No   CCM required this visit?  No   Plan:    I have personally reviewed and noted the following in the patient's chart:   Medical and social history Use of alcohol, tobacco or illicit drugs  Current medications and supplements including opioid prescriptions. Patient is not currently taking opioid prescriptions. Functional ability and status Nutritional status Physical activity Advanced directives List of other physicians Hospitalizations, surgeries, and ER visits in previous 12 months Vitals Screenings to include cognitive, depression, and falls Referrals and appointments  In addition, I have reviewed and discussed with patient certain preventive protocols, quality metrics, and best practice recommendations. A written personalized care plan  for preventive services as well as general preventive health recommendations were provided to patient.   Roz LOISE Fuller, LPN   [id number]   After Visit Summary: (MyChart) Due to this being a telephonic visit, the after visit summary with patients personalized plan was offered to patient via MyChart   Notes: Nothing significant to report at this time.

## 2024-09-02 NOTE — Patient Instructions (Signed)
 [name],  Thank you for taking the time for your Medicare Wellness Visit. I appreciate your continued commitment to your health goals. Please review the care plan we discussed, and feel free to reach out if I can assist you further.  Medicare recommends these wellness visits once per year to help you and your care team stay ahead of potential health issues. These visits are designed to focus on prevention, allowing your provider to concentrate on managing your acute and chronic conditions during your regular appointments.  Please note that Annual Wellness Visits do not include a physical exam. Some assessments may be limited, especially if the visit was conducted virtually. If needed, we may recommend a separate in-person follow-up with your provider.  Ongoing Care Seeing your primary care provider every 3 to 6 months helps us  monitor your health and provide consistent, personalized care.   Referrals If a referral was made during today's visit and you haven't received any updates within two weeks, please contact the referred provider directly to check on the status.  Recommended Screenings:  Health Maintenance  Topic Date Due   Zoster (Shingles) Vaccine (1 of 2) 12/01/1992   COVID-19 Vaccine (1 - 2024-25 season) Never done   Flu Shot  09/21/2024*   Hemoglobin A1C  10/20/2024   Complete foot exam   01/01/2025   Yearly kidney health urinalysis for diabetes  03/04/2025   Eye exam for diabetics  03/11/2025   Yearly kidney function blood test for diabetes  07/20/2025   Medicare Annual Wellness Visit  09/02/2025   DTaP/Tdap/Td vaccine (3 - Td or Tdap) 11/07/2026   Pneumococcal Vaccine for age over 33  Completed   DEXA scan (bone density measurement)  Completed   HPV Vaccine  Aged Out   Meningitis B Vaccine  Aged Out   Hepatitis C Screening  Discontinued  *Topic was postponed. The date shown is not the original due date.       09/02/2024   10:44 AM  Advanced Directives  Does  Patient Have a Medical Advance Directive? No  Would patient like information on creating a medical advance directive? No - Patient declined   Advance Care Planning is important because it: Ensures you receive medical care that aligns with your values, goals, and preferences. Provides guidance to your family and loved ones, reducing the emotional burden of decision-making during critical moments.  Vision: Annual vision screenings are recommended for early detection of glaucoma, cataracts, and diabetic retinopathy. These exams can also reveal signs of chronic conditions such as diabetes and high blood pressure.  Dental: Annual dental screenings help detect early signs of oral cancer, gum disease, and other conditions linked to overall health, including heart disease and diabetes.  Please see the attached documents for additional preventive care recommendations.

## 2024-09-22 ENCOUNTER — Telehealth: Payer: Self-pay | Admitting: *Deleted

## 2024-09-22 ENCOUNTER — Encounter: Payer: Self-pay | Admitting: *Deleted

## 2024-09-22 NOTE — Patient Instructions (Signed)
 [name] - I am sorry I was unable to reach you today for our scheduled appointment. I work with Anders Otto DASEN, MD and am calling to support your healthcare needs. Please contact me at [phone] at your earliest convenience. I look forward to speaking with you soon.   Thank you,   Olam Ku, RN, BSN [place]  [facility], [facility] RN Care Manager Direct Dial: [phone]  Fax: [phone]

## 2024-09-29 ENCOUNTER — Encounter: Payer: Self-pay | Admitting: *Deleted

## 2024-09-29 ENCOUNTER — Telehealth: Payer: Self-pay | Admitting: *Deleted

## 2024-09-29 NOTE — Patient Instructions (Signed)
 [name] - I am sorry I was unable to reach you today for our scheduled appointment. I work with Anders Otto DASEN, MD and am calling to support your healthcare needs. Please contact me at [phone] at your earliest convenience. I look forward to speaking with you soon.   Thank you,   Olam Ku, RN, BSN [place]  [facility], [facility] RN Care Manager Direct Dial: [phone]  Fax: [phone]

## 2024-10-06 ENCOUNTER — Encounter: Payer: Self-pay | Admitting: *Deleted

## 2024-10-06 ENCOUNTER — Telehealth: Payer: Self-pay | Admitting: *Deleted

## 2024-10-06 NOTE — Patient Instructions (Signed)
 [name] - I have attempted to call you three times but have been unsuccessful in reaching you. I work with Anders Otto DASEN, MD and am calling to support your healthcare needs. If I can be of assistance to you, please contact me at [phone].     Thank you,   Olam Ku, RN, BSN [place]  [facility], [facility] RN Care Manager Direct Dial: [phone]  Fax: [phone]

## 2024-10-12 ENCOUNTER — Encounter: Payer: Self-pay | Admitting: Family Medicine

## 2024-10-12 ENCOUNTER — Telehealth: Payer: Self-pay

## 2024-10-12 ENCOUNTER — Ambulatory Visit (INDEPENDENT_AMBULATORY_CARE_PROVIDER_SITE_OTHER): Admitting: Family Medicine

## 2024-10-12 VITALS — BP 138/58 | HR 71 | Ht 62.5 in | Wt 129.6 lb

## 2024-10-12 DIAGNOSIS — E1121 Type 2 diabetes mellitus with diabetic nephropathy: Secondary | ICD-10-CM

## 2024-10-12 DIAGNOSIS — R911 Solitary pulmonary nodule: Secondary | ICD-10-CM | POA: Diagnosis not present

## 2024-10-12 DIAGNOSIS — K7689 Other specified diseases of liver: Secondary | ICD-10-CM

## 2024-10-12 DIAGNOSIS — N1832 Chronic kidney disease, stage 3b: Secondary | ICD-10-CM

## 2024-10-12 LAB — POCT GLYCOSYLATED HEMOGLOBIN (HGB A1C): HbA1c, POC (controlled diabetic range): 7.4 % — AB (ref 0.0–7.0)

## 2024-10-12 NOTE — Progress Notes (Unsigned)
 Complex Care Management Care Guide Note  10/12/2024 Name: ELLA GOLOMB MRN: [id number] DOB: [birth year]  RAELENE TREW is a 83 y.o. year old female who is a primary care patient of Anders Otto DASEN, MD and is actively engaged with the care management team. I reached out to Kurstin H Schleifer by phone today to assist with re-scheduling  with the RN Case Manager.  Follow up plan: Unsuccessful telephone outreach attempt made. A HIPAA compliant phone message was left for the patient providing contact information and requesting a return call.  Leotis Rase [facility], [facility] Guide  Direct Dial: [phone]  Fax [phone]

## 2024-10-12 NOTE — Assessment & Plan Note (Addendum)
 Benign right lung nodule (history, stable, no follow-up needed) Stable since 2020 with no changes on imaging. Asymptomatic.

## 2024-10-12 NOTE — Progress Notes (Signed)
    SUBJECTIVE:   CHIEF COMPLAINT / HPI:   Discussed the use of AI scribe software for clinical note transcription with the patient, who gave verbal consent to proceed.  History of Present Illness   [name] is an 83 year old female with diabetes who presents for a diabetes follow-up.  She manages her diabetes with Lantus  insulin  at 5 units daily, metformin  extended release 1000 mg daily, and Jardiance  25 mg daily. Her last A1c was 7.4, consistent with her previous result. She walks daily, although her diet remains unchanged, and she ensures regular meals due to her diabetes medication.  She is on several medications for other conditions, including baby aspirin  81 mg daily, Lipitor 20 mg daily for cholesterol, Coreg  25 mg twice daily, Zyrtec  5 mg as needed for allergies, Flonase  as needed, lisinopril /hydrochlorothiazide  20/12.5 mg daily for blood pressure, and multivitamins.   She has a history of a stable lung nodule on the right side since 2018 and a liver cyst last discussed in 2022, with no symptoms reported. A CT of her chest in 2020 showed the pulmonary nodule to be stable and benign, and a chest x-ray in 2022 was normal. An ultrasound of her liver in 2018 revealed multiple small benign cysts.  No current symptoms such as cough or difficulty breathing. Her creatinine level was slightly elevated at 1.37 in July, consistent with her usual levels.       PERTINENT  PMH / PSH: PMHx reviewed  OBJECTIVE:   BP (!) 138/58   Pulse 71   Ht 5' 2.5 (1.588 m)   Wt 129 lb 9.6 oz (58.8 kg)   LMP 11/24/1992   SpO2 99%   BMI 23.33 kg/m   Physical Exam   VITALS: BP- 138/58 GEN: No distress CHEST: Lungs clear to auscultation, no wheezing. CARDIOVASCULAR: Heart sounds normal, no murmurs. RRR       ASSESSMENT/PLAN:   Assessment & Plan Controlled type 2 diabetes mellitus with diabetic nephropathy, without long-term current use of insulin  (HCC) Diabetes well-managed with current  medications. Creatinine stable at 1.37. Glucose 251, A1c 7.4 within target. - Continue Lantus , Jardiance , and metformin . - Schedule follow-up in 3-4 months to reassess diabetes management. Stage 3b chronic kidney disease (CKD) (HCC) Stable Nodule of right lung Benign right lung nodule (history, stable, no follow-up needed) Stable since 2020 with no changes on imaging. Asymptomatic. Liver cyst Benign liver cysts (history, stable, monitoring planned) Last evaluated in 2018, multiple small benign cysts, asymptomatic. - Schedule liver function test at next appointment in January or February. - Monitor liver cysts with liver function test results.   General Health Maintenance Declined flu and covid shots due to past adverse reactions. Maintains regular exercise and healthy diet. Weight stable.  Follow-Up Follow-up planned for diabetes management and liver cyst monitoring. - Schedule follow-up appointment in January or February for diabetes management and liver function test.  Otto Fairly, MD [facility]

## 2024-10-12 NOTE — Patient Instructions (Signed)

## 2024-10-12 NOTE — Assessment & Plan Note (Addendum)
 Benign liver cysts (history, stable, monitoring planned) Last evaluated in 2018, multiple small benign cysts, asymptomatic. - Schedule liver function test at next appointment in January or February. - Monitor liver cysts with liver function test results.

## 2024-10-12 NOTE — Assessment & Plan Note (Addendum)
 Stable

## 2024-10-16 ENCOUNTER — Other Ambulatory Visit: Payer: Self-pay | Admitting: Family Medicine

## 2024-11-12 ENCOUNTER — Other Ambulatory Visit: Payer: Self-pay | Admitting: Family Medicine

## 2024-12-01 ENCOUNTER — Other Ambulatory Visit: Payer: Self-pay | Admitting: Family Medicine

## 2024-12-09 ENCOUNTER — Other Ambulatory Visit: Payer: Self-pay | Admitting: Family Medicine

## 2024-12-17 ENCOUNTER — Other Ambulatory Visit: Payer: Self-pay

## 2024-12-17 ENCOUNTER — Emergency Department (HOSPITAL_BASED_OUTPATIENT_CLINIC_OR_DEPARTMENT_OTHER)
Admission: EM | Admit: 2024-12-17 | Discharge: 2024-12-17 | Disposition: A | Discharge status: Home / Self Care | Source: Ambulatory Visit | Attending: Emergency Medicine | Admitting: Emergency Medicine

## 2024-12-17 ENCOUNTER — Encounter (HOSPITAL_BASED_OUTPATIENT_CLINIC_OR_DEPARTMENT_OTHER): Payer: Self-pay

## 2024-12-17 ENCOUNTER — Emergency Department (HOSPITAL_BASED_OUTPATIENT_CLINIC_OR_DEPARTMENT_OTHER)

## 2024-12-17 ENCOUNTER — Telehealth: Payer: Self-pay | Admitting: Family Medicine

## 2024-12-17 DIAGNOSIS — S0990XA Unspecified injury of head, initial encounter: Secondary | ICD-10-CM | POA: Diagnosis present

## 2024-12-17 DIAGNOSIS — W01198A Fall on same level from slipping, tripping and stumbling with subsequent striking against other object, initial encounter: Secondary | ICD-10-CM | POA: Diagnosis not present

## 2024-12-17 DIAGNOSIS — Z7982 Long term (current) use of aspirin: Secondary | ICD-10-CM | POA: Diagnosis not present

## 2024-12-17 DIAGNOSIS — S0081XA Abrasion of other part of head, initial encounter: Secondary | ICD-10-CM | POA: Insufficient documentation

## 2024-12-17 DIAGNOSIS — W19XXXA Unspecified fall, initial encounter: Secondary | ICD-10-CM

## 2024-12-17 NOTE — Discharge Instructions (Addendum)
 Continue ice Tylenol  and ibuprofen 

## 2024-12-17 NOTE — ED Provider Notes (Addendum)
 " [place] [facility] Provider Note   CSN: [id number] Arrival date & time: 12/17/24  1048     Patient presents with: [name] [name] is a 83 y.o. female.   Patient here after she tripped and fell and hit her head last night.  There is a slight headache.  Is not on blood thinners.  Nothing makes worse or better.  Denies any weakness numbness tingling.  No extremity pain.  Ambulatory after the fall.  No difficulty breathing.  The history is provided by the patient.       Prior to Admission medications  Medication Sig Start Date End Date Taking? Authorizing Provider  Accu-Chek FastClix Lancets MISC USE 1 LANCET TO TEST THREE TIMES A DAY 01/29/24   Anders Otto DASEN, MD  aspirin  81 MG chewable tablet Chew 81 mg by mouth daily.    [provider]  atorvastatin  (LIPITOR) 20 MG tablet TAKE 1 TABLET BY MOUTH DAILY 10/18/24   Anders Otto T, MD  Blood Glucose Monitoring Suppl (ACCU-CHEK GUIDE) w/Device KIT USE AS DIRECTED IN THE MORNING, AT NOON AND AT BEDTIME Strength: w/Device 04/01/22   Anders Otto DASEN, MD  carvedilol  (COREG ) 25 MG tablet TAKE 1 TABLET BY MOUTH TWICE A DAY WITH A MEAL 03/01/24   Anders Otto DASEN, MD  cetirizine  (ZYRTEC ) 5 MG tablet Take 1 tablet (5 mg total) by mouth daily. 12/02/23   Anders Otto DASEN, MD  empagliflozin  (JARDIANCE ) 25 MG TABS tablet TAKE 1 TABLET BY MOUTH DAILY 12/09/24   Anders Otto DASEN, MD  ferrous sulfate  325 (65 FE) MG tablet Take 325 mg by mouth daily with breakfast.    [provider]  fluticasone  (FLONASE ) 50 MCG/ACT nasal spray Place 2 sprays into both nostrils daily. 12/02/23   Anders Otto T, MD  glucose blood (ACCU-CHEK GUIDE TEST) test strip USE TO TEST BLOOD SUGAR THREE TIMES A DAY 12/01/24   Anders Otto DASEN, MD  insulin  glargine (LANTUS  SOLOSTAR) 100 UNIT/ML Solostar Pen INJECT 5 UNITS UNDER THE SKIN ONCE DAILY 07/29/24   Anders Otto DASEN, MD  Insulin  Pen Needle  (DROPLET PEN NEEDLES) 32G X 6 MM MISC USE ONCE DAILY TO INJECT INSULIN  12/01/24   Anders Otto DASEN, MD  Insulin  Syringe-Needle U-100 (B-D INS SYR ULTRAFINE .3CC/31G) 31G X 5/16 0.3 ML MISC Use to inject insulin  daily 09/18/23   Anders Otto DASEN, MD  isosorbide  mononitrate (IMDUR ) 60 MG 24 hr tablet TAKE 1 AND 1/2 TABLET BY MOUTH DAILY 04/26/24   Nishan, Peter C, MD  lisinopril -hydrochlorothiazide  (ZESTORETIC ) 20-12.5 MG tablet TAKE 1 TABLET BY MOUTH DAILY 07/15/24   Anders Otto DASEN, MD  metFORMIN  (GLUCOPHAGE -XR) 500 MG 24 hr tablet TAKE 2 TABLETS BY MOUTH DAILY WITH BREAKFAST 11/12/24   Anders Otto DASEN, MD  Multiple Vitamin (MULTIVITAMIN WITH MINERALS) TABS tablet Take 1 tablet by mouth daily. Reported on 04/02/2016    [provider]  nitroGLYCERIN  (NITROSTAT ) 0.4 MG SL tablet Place 1 tablet (0.4 mg total) under the tongue every 5 (five) minutes as needed for chest pain. 03/31/24   Nishan, Peter C, MD    Allergies: Codeine, Sulfamethoxazole-trimethoprim, and Tramadol     Review of Systems  Updated Vital Signs BP (!) 182/56 (BP Location: Left Arm)   Pulse 66   Temp 98.4 F (36.9 C)   Resp 18   Ht 5' 2 (1.575 m)   Wt 62.1 kg   LMP 11/24/1992   SpO2 100%  BMI 25.06 kg/m   Physical Exam Vitals and nursing note reviewed.  Constitutional:      General: She is not in acute distress.    Appearance: She is well-developed.  HENT:     Head: Normocephalic and atraumatic.     Mouth/Throat:     Mouth: Mucous membranes are moist.  Eyes:     Extraocular Movements: Extraocular movements intact.     Conjunctiva/sclera: Conjunctivae normal.     Pupils: Pupils are equal, round, and reactive to light.  Cardiovascular:     Rate and Rhythm: Normal rate and regular rhythm.     Pulses: Normal pulses.     Heart sounds: Normal heart sounds. No murmur heard. Pulmonary:     Effort: Pulmonary effort is normal. No respiratory distress.     Breath sounds: Normal breath sounds.  Abdominal:      Palpations: Abdomen is soft.     Tenderness: There is no abdominal tenderness.  Musculoskeletal:        General: No swelling.     Cervical back: Neck supple.     Comments: No bony tenderness  Skin:    General: Skin is warm and dry.     Capillary Refill: Capillary refill takes less than 2 seconds.     Comments: Abrasions to the face  Neurological:     General: No focal deficit present.     Mental Status: She is alert and oriented to person, place, and time.     Cranial Nerves: No cranial nerve deficit.     Sensory: No sensory deficit.     Motor: No weakness.     Coordination: Coordination normal.  Psychiatric:        Mood and Affect: Mood normal.     (all labs ordered are listed, but only abnormal results are displayed) Labs Reviewed - No data to display  EKG: None  Radiology: CT Head Wo Contrast Result Date: 12/17/2024 EXAM: CT HEAD, FACIAL BONES AND CERVICAL SPINE WITHOUT CONTRAST 12/17/2024 11:49:20 AM TECHNIQUE: CT of the head, facial bones and cervical spine was performed without the administration of intravenous contrast. Multiplanar reformatted images are provided for review. Automated exposure control, iterative reconstruction, and/or weight based adjustment of the mA/kV was utilized to reduce the radiation dose to as low as reasonably achievable. COMPARISON: Thyroid  ultrasound 07/11/2021. CLINICAL HISTORY: Headache, post traumatic. 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 No acute intracranial hemorrhage. No mass effect or midline shift. No extra-axial fluid collection. No evidence of acute infarct. No hydrocephalus. SKULL AND SCALP: No acute skull fracture. No scalp hematoma. CT FACIAL BONES FACIAL BONES: No acute facial fracture. No mandibular dislocation. No suspicious bone lesion. ORBITS: No acute traumatic injury. SINUSES AND MASTOIDS: No acute abnormality. SOFT TISSUES: No acute abnormality. CT CERVICAL SPINE BONES  AND ALIGNMENT: No acute fracture or traumatic malalignment. DEGENERATIVE CHANGES: Mild degenerative changes of the cervical spine without spinal canal stenosis. SOFT TISSUES: Prior right thyroidectomy. Unchanged heterogeneous enlargement of the left thyroid  lobe, better evaluated on prior thyroid  ultrasound. Associated rightward deviation of the trachea without airway compromise. No prevertebral soft tissue swelling. IMPRESSION: 1. No acute intracranial abnormality. 2. No acute fracture or traumatic malalignment of the cervical spine. 3. No acute facial bone fracture. Electronically signed by: Ryan Chess MD 12/17/2024 11:58 AM EST RP Workstation: [id number]   CT Maxillofacial Wo Contrast Result Date: 12/17/2024 EXAM: CT HEAD, FACIAL BONES AND CERVICAL SPINE WITHOUT CONTRAST 12/17/2024 11:49:20 AM TECHNIQUE: CT  of the head, facial bones and cervical spine was performed without the administration of intravenous contrast. Multiplanar reformatted images are provided for review. Automated exposure control, iterative reconstruction, and/or weight based adjustment of the mA/kV was utilized to reduce the radiation dose to as low as reasonably achievable. COMPARISON: Thyroid  ultrasound 07/11/2021. CLINICAL HISTORY: Headache, post traumatic. 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 No acute intracranial hemorrhage. No mass effect or midline shift. No extra-axial fluid collection. No evidence of acute infarct. No hydrocephalus. SKULL AND SCALP: No acute skull fracture. No scalp hematoma. CT FACIAL BONES FACIAL BONES: No acute facial fracture. No mandibular dislocation. No suspicious bone lesion. ORBITS: No acute traumatic injury. SINUSES AND MASTOIDS: No acute abnormality. SOFT TISSUES: No acute abnormality. CT CERVICAL SPINE BONES AND ALIGNMENT: No acute fracture or traumatic malalignment. DEGENERATIVE CHANGES: Mild degenerative changes of the cervical spine  without spinal canal stenosis. SOFT TISSUES: Prior right thyroidectomy. Unchanged heterogeneous enlargement of the left thyroid  lobe, better evaluated on prior thyroid  ultrasound. Associated rightward deviation of the trachea without airway compromise. No prevertebral soft tissue swelling. IMPRESSION: 1. No acute intracranial abnormality. 2. No acute fracture or traumatic malalignment of the cervical spine. 3. No acute facial bone fracture. Electronically signed by: Ryan Chess MD 12/17/2024 11:58 AM EST RP Workstation: [id number]   CT Cervical Spine Wo Contrast Result Date: 12/17/2024 EXAM: CT HEAD, FACIAL BONES AND CERVICAL SPINE WITHOUT CONTRAST 12/17/2024 11:49:20 AM TECHNIQUE: CT of the head, facial bones and cervical spine was performed without the administration of intravenous contrast. Multiplanar reformatted images are provided for review. Automated exposure control, iterative reconstruction, and/or weight based adjustment of the mA/kV was utilized to reduce the radiation dose to as low as reasonably achievable. COMPARISON: Thyroid  ultrasound 07/11/2021. CLINICAL HISTORY: Headache, post traumatic. 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 No acute intracranial hemorrhage. No mass effect or midline shift. No extra-axial fluid collection. No evidence of acute infarct. No hydrocephalus. SKULL AND SCALP: No acute skull fracture. No scalp hematoma. CT FACIAL BONES FACIAL BONES: No acute facial fracture. No mandibular dislocation. No suspicious bone lesion. ORBITS: No acute traumatic injury. SINUSES AND MASTOIDS: No acute abnormality. SOFT TISSUES: No acute abnormality. CT CERVICAL SPINE BONES AND ALIGNMENT: No acute fracture or traumatic malalignment. DEGENERATIVE CHANGES: Mild degenerative changes of the cervical spine without spinal canal stenosis. SOFT TISSUES: Prior right thyroidectomy. Unchanged heterogeneous enlargement of the left thyroid  lobe,  better evaluated on prior thyroid  ultrasound. Associated rightward deviation of the trachea without airway compromise. No prevertebral soft tissue swelling. IMPRESSION: 1. No acute intracranial abnormality. 2. No acute fracture or traumatic malalignment of the cervical spine. 3. No acute facial bone fracture. Electronically signed by: Ryan Chess MD 12/17/2024 11:58 AM EST RP Workstation: [id number]     Procedures   Medications Ordered in the ED - No data to display                                  Medical Decision Making Amount and/or Complexity of Data Reviewed Radiology: ordered.   [name] is here after mechanical fall last night.  Unremarkable vitals.  Pain in the back of her head and face.  CT scan of the head neck face done prior to my evaluation which are normal.  She has no bony tenderness elsewhere.  Neurologically intact.  Possibly mild concussion but  continue supportive care at home with Tylenol  and ibuprofen .  I have no concern for any other traumatic processes.  Patient discharged in good condition.  Understands return precautions.  Discharge.  Can use bacitracin Neosporin to abrasions on the face.  This chart was dictated using voice recognition software.  Despite best efforts to proofread,  errors can occur which can change the documentation meaning.      Final diagnoses:  Fall, initial encounter    ED Discharge Orders     None          Ruthe Cornet, DO 12/17/24 1328    Ruthe Cornet, DO 12/17/24 1330  "

## 2024-12-17 NOTE — ED Triage Notes (Addendum)
 Pt tripped and fell last night. Pt hit her head on the left side, skin abrasion noted. Pt endorses slight headache. No LOC, not on thinners. Denies n/v/dizziness.   Pt hypertensive in triage. States she has not taken her blood pressure medicine today.

## 2024-12-17 NOTE — Telephone Encounter (Signed)
**  After Hours/ Emergency Line Call**  Received a page to call [phone]) - [phone].  Patient: [name]  Caller: Self  Confirmed name & DOB of patient with caller  Subjective:    Pt reports she had a fall last night onto concrete. She didn't want to go to ED last night but daughter advised her to call today. Was walking off the sidewalk and misstepped. Denies preceding SOB, palpitations, CP, syncope She did hit her head on the side around her eye/temple She does have some bruises/scratches on her face. It was hurting a little last night but it got better with tylenol . Currently reports bruising and a little bit of tenderness on her face When she first hit her head she felt a bit dizzy for a bit but this got better after a couple of minutes She did NOT lose consciousness. She is NOT on blood thinners aside from 81mg  aspirin  Denies numbness/weakness, facial droop. She's able to speak, eat, drink, walk normally  Objective:  Observations: NAD coherent speaking in full sentences   Assessment & Plan  [name] is a 83 y.o. female with PMHx s/f HTN, CAD, DM, CKD, HLD who calls with the following complaints and concerns:   Fall from standing height - yesterday evening. Tripped on sidewalk/step onto concrete.  No prodromal symptoms concerning for preceding syncope or more pathologic cause of her fall Currently does not appear to have significant neurologic symptoms. However she does report some initial dizziness after hitting her head and some persistent headache. Also reports some bruising and tenderness on her face. She is not on blood thinners   Recommendations:  Recommend ED eval to consider imaging given her age and reported head strike with bruising/tenderness on face Daughter to transport    -- Red flags discussed.   -- Will forward to PCP.  Payton Coward, MD [facility], PGY-3

## 2024-12-30 NOTE — Progress Notes (Unsigned)
" ° ° °  SUBJECTIVE:   CHIEF COMPLAINT / HPI:   L Shoulder Pain s/p fall 12/26  PERTINENT  PMH / PSH: HTN, T2DM  OBJECTIVE:   LMP 11/24/1992   General: Awake and Alert in NAD HEENT: NCAT. Sclera anicteric. No rhinorrhea. Cardiovascular: RRR. No M/R/G Respiratory: CTAB, normal WOB on RA. No wheezing, crackles, rhonchi, or diminished breath sounds. Abdomen: Soft, non-tender, non-distended. Bowel sounds normoactive/hypoactive/hyperactive. *** Extremities: Able to move all extremities. No BLE edema, no deformities or significant joint findings. Skin: Warm and dry. No abrasions or rashes noted. Neuro: A&Ox***. No focal neurological deficits.  ASSESSMENT/PLAN:   Assessment & Plan      Kathrine Melena, DO [facility] "

## 2024-12-31 ENCOUNTER — Ambulatory Visit: Payer: Self-pay | Admitting: Family Medicine

## 2024-12-31 ENCOUNTER — Ambulatory Visit: Admitting: Family Medicine

## 2024-12-31 VITALS — BP 145/68 | HR 76 | Ht 62.0 in | Wt 127.8 lb

## 2024-12-31 DIAGNOSIS — M25512 Pain in left shoulder: Secondary | ICD-10-CM | POA: Diagnosis not present

## 2024-12-31 DIAGNOSIS — I10 Essential (primary) hypertension: Secondary | ICD-10-CM | POA: Diagnosis not present

## 2024-12-31 MED ORDER — OXYCODONE HCL 5 MG PO TABS: 2.5000 mg | ORAL_TABLET | Freq: Two times a day (BID) | ORAL | 0 refills | Status: AC | PRN

## 2024-12-31 MED ORDER — MELOXICAM 7.5 MG PO TABS: 7.5000 mg | ORAL_TABLET | Freq: Every day | ORAL | 0 refills | Status: DC

## 2024-12-31 NOTE — Assessment & Plan Note (Addendum)
-   most likely muscular, related to landing on her left side - no point tenderness to indicate bony injury - start meloxicam  7.5 mg daily for 2 weeks-ultimately, patient's kidney function will not tolerate use of NSAIDs, switch to 2.5 mg oxycodone  as needed - ok to continue tylenol  - gentle stretching/mobility exercises provided - follow up in 2 weeks

## 2024-12-31 NOTE — Addendum Note (Signed)
 Addended by: Jonna Dittrich on: 12/31/2024 05:03 PM   Modules accepted: Orders

## 2024-12-31 NOTE — Patient Instructions (Addendum)
 It was wonderful to see you today!  The good news is I do not think that your pain is caused by any problems with the bones or the ligaments.  Most likely this is residual soreness related to your fall.  We will try treatment with a nonsteroidal anti-inflammatory medicine called meloxicam .  Please take this once a day for the next 14 days.  You can take along with this medicine but you should not take other medicines like ibuprofen  or Aleve while taking this medicine.  Other things that you should do to help improve the pain in your shoulder are the 2 exercises we discussed below is a description of how to do them.  Arm circles Lean to 1 side, and let your arm dangle freely in space Starting small rotate your arm and small circles counterclockwise After about 20 seconds switch directions Do this 2-3 times per day  Doorframe chest stretch Raise your arms so that your elbow is parallel to your shoulder Place your elbow along the edge of a door frame so that your body is within the door opening While bracing your elbow against the frame, gently leaned forward so that you feel a stretch across your chest Wall at this position for 15 to 30 seconds Do this 2-3 times per day  Your blood pressure was elevated today in the office.  While this could be secondary to pain you should follow-up with your primary care doctor to discuss any changes that may be necessary to better control your blood pressure.  Please call [phone] with any questions about today's appointment.   If you need any additional refills, please call your pharmacy before calling the office.  Lucie Pinal, DO Family Medicine

## 2024-12-31 NOTE — Progress Notes (Cosign Needed Addendum)
" ° ° °  SUBJECTIVE:   CHIEF COMPLAINT / HPI:   Left Sided Shoulder pain s/p fall 12/26, seen in ED CT head, maxillofacial bones, c-spine negative - landed on left side when she fell - was not sore initially, but did feel sore the next day - taking tylenol  with improvement, but pain has not resolved - no motion restrictions, but does have trouble dressing and lifting her arm above her head due to pain - no prior injuries to the left shoulder, but does have known separation of R shoulder that has not been corrected  PERTINENT  PMH / PSH: Osteoarthritis  OBJECTIVE:   BP (!) 145/68   Pulse 76   Ht 5' 2 (1.575 m)   Wt 127 lb 12.8 oz (58 kg)   LMP 11/24/1992   SpO2 97%   BMI 23.37 kg/m   General: A&O, NAD Cardiac: RRR, no m/r/g Respiratory: CTAB, normal WOB, no w/c/r MSK, Shoulder: Left  Palpation: No tenderness over the [facility] or glenohumeral joints. No obvious step off or  sulcus sign. No tenderness over the coracoid or biceps tendons. No obvious swelling or  Edema.  Skin: Skin over the joint is without evident bruising or erythema. No wounds or other abnormalities  Strength: 5/5 strength in all four plains of motion. Mild pain noted with strength testing in abduction  Range of motion: Full,  ROM in all plains of motion at the shoulder, neck and elbow.  Special tests: Negative empty can. Negative Lift Off test.   ASSESSMENT/PLAN:   Assessment & Plan Acute pain of left shoulder - most likely muscular, related to landing on her left side - no point tenderness to indicate bony injury - start meloxicam  7.5 mg daily for 2 weeks-ultimately, patient's kidney function will not tolerate use of NSAIDs, switch to 2.5 mg oxycodone  as needed - ok to continue tylenol  - gentle stretching/mobility exercises provided - follow up in 2 weeks Essential hypertension - readings elevated in office today - patient endorses taking her medications - recommend follow up with PCP to address medication  management   Lucie Pinal, DO [facility] "

## 2024-12-31 NOTE — Assessment & Plan Note (Signed)
-   readings elevated in office today - patient endorses taking her medications - recommend follow up with PCP to address medication management

## 2025-01-14 ENCOUNTER — Other Ambulatory Visit: Payer: Self-pay | Admitting: Family Medicine

## 2025-01-17 ENCOUNTER — Encounter: Payer: Self-pay | Admitting: Family Medicine

## 2025-01-18 ENCOUNTER — Ambulatory Visit: Admitting: Family Medicine

## 2025-01-25 ENCOUNTER — Ambulatory Visit: Admitting: Family Medicine

## 2025-01-25 ENCOUNTER — Encounter: Payer: Self-pay | Admitting: Family Medicine

## 2025-01-25 VITALS — BP 169/63 | HR 68 | Ht 62.0 in | Wt 129.6 lb

## 2025-01-25 DIAGNOSIS — E7800 Pure hypercholesterolemia, unspecified: Secondary | ICD-10-CM

## 2025-01-25 DIAGNOSIS — E119 Type 2 diabetes mellitus without complications: Secondary | ICD-10-CM

## 2025-01-25 DIAGNOSIS — E11319 Type 2 diabetes mellitus with unspecified diabetic retinopathy without macular edema: Secondary | ICD-10-CM

## 2025-01-25 DIAGNOSIS — M25512 Pain in left shoulder: Secondary | ICD-10-CM

## 2025-01-25 DIAGNOSIS — N1832 Chronic kidney disease, stage 3b: Secondary | ICD-10-CM | POA: Diagnosis not present

## 2025-01-25 DIAGNOSIS — I10 Essential (primary) hypertension: Secondary | ICD-10-CM | POA: Diagnosis not present

## 2025-01-25 DIAGNOSIS — E1169 Type 2 diabetes mellitus with other specified complication: Secondary | ICD-10-CM | POA: Diagnosis not present

## 2025-01-25 DIAGNOSIS — D696 Thrombocytopenia, unspecified: Secondary | ICD-10-CM

## 2025-01-25 DIAGNOSIS — G8929 Other chronic pain: Secondary | ICD-10-CM

## 2025-01-25 LAB — POCT GLYCOSYLATED HEMOGLOBIN (HGB A1C): HbA1c, POC (controlled diabetic range): 6.4 % (ref 0.0–7.0)

## 2025-01-25 NOTE — Progress Notes (Signed)
 "   SUBJECTIVE:   CHIEF COMPLAINT / HPI:   Discussed the use of AI scribe software for clinical note transcription with the patient, who gave verbal consent to proceed.  History of Present Illness   [name] is an 84 year old female with hypertension and diabetes who presents for a blood pressure check.  HTN/DM2/HLD: She is compliant with her medication regimen, which includes  Imdur  90 mg daily, Coreg  25 mg twice daily, and lisinopril -hydrochlorothiazide  20/12.5 mg daily for her HTN. However, she stated that she takes her Lisinopril /HCTZ BID rather than daily. Her blood pressure today is 183/74; over the past few days, it has been approximately 140 systolic, with a maximum of 150 systolic. She missed her Coreg  dose last night, which she attributes to the elevated reading today. Previously, on January 9, her blood pressure was 163/59; it subsequently decreased to 145/68 after rechecking. For diabetes, she is compliant with her Lantus  5 units daily, Jardiance  25 mg daily and metformin  500mg  XR 2 tab daily.  Lipitor 20 mg daily for HLD. Her glucose levels at home are usually around 120-125, with the highest being 130. She mentions having one episode of low glucose. Her A1c was 7.4% three months ago and is 6.4% today.  FALL She experienced a fall around Christmas, resulting in a head injury and subsequent headaches. She visited urgent care, and the headaches have lessened over time, though she occasionally takes extra-strength Tylenol  for relief. She also sustained injuries to her left side, including her forehead, chin, shoulder, and knees. Her shoulder still experiences pain occasionally.  She has ongoing issues with her left eye, which was affected by the fall. It was initially painful and draining, but has improved with treatment from an ophthalmologist. Symptoms improved overall. No current headaches, dizziness, or feeling unwell.  CKD: She has a history of mild kidney dysfunction,  with a creatinine level of 1.37 in July of last year.    Thrombocytopenia: Her platelet count was low at 111 in August of last year. No bleeding concern.       PERTINENT  PMH / PSH: PMHx reviewed  OBJECTIVE:   BP (!) 169/63   Pulse 68   Ht 5' 2 (1.575 m)   Wt 129 lb 9.6 oz (58.8 kg)   LMP 11/24/1992   SpO2 99%   BMI 23.70 kg/m   Physical Exam   VITALS: BP- 169/63 HEENT: No facial swelling, erythema, or periorbital edema. CHEST: Lungs clear to auscultation, no wheezing. CARDIOVASCULAR: Heart regular rate and rhythm, no murmurs. EXTREMITIES: No leg edema. Bunion on both feet bilaterally. NEUROLOGICAL: Normal monofilament testing of both feet.       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 Repeat BP improved to 169/63. Missed Coreg  dose may have contributed. Uncertainty about her medication regimen complicates management. - Rechecked blood pressure in the office, improved - Instructed to monitor blood pressure at home and report if consistently in the 160s-170s. - Scheduled follow-up appointment on February 6th at 10:50 AM. - Instructed to bring medication bottles to next appointment for review. - Advised to seek medical attention if experiencing headache, nausea, vomiting, dizziness, or gait instability. Thrombocytopenia Chronic  low platelet count with the last recorded level at 111,000/?L in August. - Will recheck platelet count at next lab appointment. Stage 3b chronic kidney disease (CKD) (HCC) Chronic kidney disease Mild kidney dysfunction with the last creatinine  level at 1.37 mg/dL in July. - Will recheck creatinine level in the next few months. - Encourage good hydration and avoid nephrotoxic agents. Chronic left shoulder pain S/P fall - improving ED note and imaging reviewed Monitor closely for now HYPERCHOLESTEROLEMIA Stable Diabetes mellitus type 2, controlled (HCC) A1c improved from 7.4% to 6.4%, indicating good glycemic control. - Continue current diabetes medications: Jardiance  25 mg daily, Lantus  5 units daily, Metformin  1000 mg daily. - Checked urine for protein as part of annual diabetes management > Unable to give her urine today.     Otto Fairly, MD [facility]  "

## 2025-01-25 NOTE — Assessment & Plan Note (Addendum)
 Follow up with ophthalmology closely

## 2025-01-25 NOTE — Assessment & Plan Note (Addendum)
 A1c improved from 7.4% to 6.4%, indicating good glycemic control. - Continue current diabetes medications: Jardiance  25 mg daily, Lantus  5 units daily, Metformin  1000 mg daily. - Checked urine for protein as part of annual diabetes management > Unable to give her urine today.

## 2025-01-25 NOTE — Assessment & Plan Note (Addendum)
 Stable

## 2025-01-25 NOTE — Assessment & Plan Note (Addendum)
 Chronic kidney disease Mild kidney dysfunction with the last creatinine level at 1.37 mg/dL in July. - Will recheck creatinine level in the next few months. - Encourage good hydration and avoid nephrotoxic agents.

## 2025-01-25 NOTE — Assessment & Plan Note (Addendum)
 Blood pressure was elevated at 183/74 mmHg. Repeat BP improved to 169/63. Missed Coreg  dose may have contributed. Uncertainty about her medication regimen complicates management. - Rechecked blood pressure in the office, improved - Instructed to monitor blood pressure at home and report if consistently in the 160s-170s. - Scheduled follow-up appointment on February 6th at 10:50 AM. - Instructed to bring medication bottles to next appointment for review. - Advised to seek medical attention if experiencing headache, nausea, vomiting, dizziness, or gait instability.

## 2025-01-25 NOTE — Patient Instructions (Signed)
 Hypertension, Adult Hypertension is another name for high blood pressure. High blood pressure forces your heart to work harder to pump blood. This can cause problems over time. There are two numbers in a blood pressure reading. There is a top number (systolic) over a bottom number (diastolic). It is best to have a blood pressure that is below 120/80. What are the causes? The cause of this condition is not known. Some other conditions can lead to high blood pressure. What increases the risk? Some lifestyle factors can make you more likely to develop high blood pressure: Smoking. Not getting enough exercise or physical activity. Being overweight. Having too much fat, sugar, calories, or salt (sodium) in your diet. Drinking too much alcohol. Other risk factors include: Having any of these conditions: Heart disease. Diabetes. High cholesterol. Kidney disease. Obstructive sleep apnea. Having a family history of high blood pressure and high cholesterol. Age. The risk increases with age. Stress. What are the signs or symptoms? High blood pressure may not cause symptoms. Very high blood pressure (hypertensive crisis) may cause: Headache. Fast or uneven heartbeats (palpitations). Shortness of breath. Nosebleed. Vomiting or feeling like you may vomit (nauseous). Changes in how you see. Very bad chest pain. Feeling dizzy. Seizures. How is this treated? This condition is treated by making healthy lifestyle changes, such as: Eating healthy foods. Exercising more. Drinking less alcohol. Your doctor may prescribe medicine if lifestyle changes do not help enough and if: Your top number is above 130. Your bottom number is above 80. Your personal target blood pressure may vary. Follow these instructions at home: Eating and drinking  If told, follow the DASH eating plan. To follow this plan: Fill one half of your plate at each meal with fruits and vegetables. Fill one fourth of your plate  at each meal with whole grains. Whole grains include whole-wheat pasta, brown rice, and whole-grain bread. Eat or drink low-fat dairy products, such as skim milk or low-fat yogurt. Fill one fourth of your plate at each meal with low-fat (lean) proteins. Low-fat proteins include fish, chicken without skin, eggs, beans, and tofu. Avoid fatty meat, cured and processed meat, or chicken with skin. Avoid pre-made or processed food. Limit the amount of salt in your diet to less than 1,500 mg each day. Do not drink alcohol if: Your doctor tells you not to drink. You are pregnant, may be pregnant, or are planning to become pregnant. If you drink alcohol: Limit how much you have to: 0-1 drink a day for women. 0-2 drinks a day for men. Know how much alcohol is in your drink. In the [place] one drink equals one 12 oz bottle of beer (355 mL), one 5 oz glass of wine (148 mL), or one 1 oz glass of hard liquor (44 mL). Lifestyle  Work with your doctor to stay at a healthy weight or to lose weight. Ask your doctor what the best weight is for you. Get at least 30 minutes of exercise that causes your heart to beat faster (aerobic exercise) most days of the week. This may include walking, swimming, or biking. Get at least 30 minutes of exercise that strengthens your muscles (resistance exercise) at least 3 days a week. This may include lifting weights or doing Pilates. Do not smoke or use any products that contain nicotine or tobacco. If you need help quitting, ask your doctor. Check your blood pressure at home as told by your doctor. Keep all follow-up visits. Medicines Take over-the-counter and prescription medicines  only as told by your doctor. Follow directions carefully. Do not skip doses of blood pressure medicine. The medicine does not work as well if you skip doses. Skipping doses also puts you at risk for problems. Ask your doctor about side effects or reactions to medicines that you should watch  for. Contact a doctor if: You think you are having a reaction to the medicine you are taking. You have headaches that keep coming back. You feel dizzy. You have swelling in your ankles. You have trouble with your vision. Get help right away if: You get a very bad headache. You start to feel mixed up (confused). You feel weak or numb. You feel faint. You have very bad pain in your: Chest. Belly (abdomen). You vomit more than once. You have trouble breathing. These symptoms may be an emergency. Get help right away. Call 911. Do not wait to see if the symptoms will go away. Do not drive yourself to the hospital. Summary Hypertension is another name for high blood pressure. High blood pressure forces your heart to work harder to pump blood. For most people, a normal blood pressure is less than 120/80. Making healthy choices can help lower blood pressure. If your blood pressure does not get lower with healthy choices, you may need to take medicine. This information is not intended to replace advice given to you by your health care provider. Make sure you discuss any questions you have with your health care provider. Document Revised: 09/27/2021 Document Reviewed: 09/27/2021 Elsevier Patient Education  2024 [employer].

## 2025-01-26 NOTE — Addendum Note (Signed)
 Addended by: ANDERS CUMINS T on: 01/26/2025 10:45 AM   Modules accepted: Orders

## 2025-01-28 ENCOUNTER — Encounter: Payer: Self-pay | Admitting: Family Medicine

## 2025-01-28 ENCOUNTER — Ambulatory Visit: Admitting: Family Medicine

## 2025-01-28 VITALS — BP 162/56 | HR 73 | Ht 62.0 in | Wt 127.1 lb

## 2025-01-28 DIAGNOSIS — N1832 Chronic kidney disease, stage 3b: Secondary | ICD-10-CM

## 2025-01-28 DIAGNOSIS — E119 Type 2 diabetes mellitus without complications: Secondary | ICD-10-CM

## 2025-01-28 DIAGNOSIS — E1121 Type 2 diabetes mellitus with diabetic nephropathy: Secondary | ICD-10-CM

## 2025-01-28 DIAGNOSIS — I10 Essential (primary) hypertension: Secondary | ICD-10-CM

## 2025-01-28 MED ORDER — LISINOPRIL-HYDROCHLOROTHIAZIDE 20-12.5 MG PO TABS: 2.0000 | ORAL_TABLET | Freq: Every day | ORAL | Status: AC

## 2025-01-28 NOTE — Assessment & Plan Note (Addendum)
 Systolic blood pressure remains elevated despite current medication regimen. Target is less than 130/80 mmHg. Diastolic BP is low. - She had been on Lisinopril /HCTZ20/12.5 mg 2 tab daily in the past. Uncertain if she takes one tabs or two now and she did not come with her home bottles. - Take lisinopril  hydrochlorothiazide  to two tablets daily for now. Close home BP monitoring with parameters provided. - Referred to hypertension clinic for further management. - Scheduled follow-up appointment in two weeks.

## 2025-01-28 NOTE — Patient Instructions (Signed)
 Nice seeing you today. You BP remains above goal. Please increase you Lisinopril /hydrochlorothiazide  29/12.5 mg to 2 tablets daily. See me back in 2 weeks for reassessment. Please come with all home medication bottles.

## 2025-01-28 NOTE — Assessment & Plan Note (Addendum)
 Diabetes is well-controlled with current regimen. - Urine microalbumin checked

## 2025-01-28 NOTE — Progress Notes (Signed)
" ° ° °  SUBJECTIVE:   CHIEF COMPLAINT / HPI:   Discussed the use of AI scribe software for clinical note transcription with the patient, who gave verbal consent to proceed.  History of Present Illness   [name] is an 84 year old female with hypertension and diabetes who presents for blood pressure management.  CKD: She is concerned about her kidney health due to her history of diabetes and hypertension. Her last kidney function test was in July of the previous year, and she is not currently due for another test. No GU symptoms.   HTN Her blood pressure has been fluctuating, with a reading of 157/54 mmHg during the visit. She checks her blood pressure every morning, with a recent reading of 140/58 mmHg. She is currently taking Coreg  25 mg twice daily, Isosorbide  mononitrate 90 mg daily, and Lisinopril -Hydrochlorothiazide  20/12.5 mg one tablet daily, but uncertain if she sometimes takes it two tabs daily.  DM2 She is managing her diabetes with Lantus  insulin , taking 5 units daily, Jardiance  25 mg QD and Metformin  XL 1000 mg QD. No headaches, chest pain, or difficulty breathing.       PERTINENT  PMH / PSH: PMHx reviewed  OBJECTIVE:   BP (!) 162/56   Pulse 73   Ht 5' 2 (1.575 m)   Wt 127 lb 2 oz (57.7 kg)   LMP 11/24/1992   SpO2 95%   BMI 23.25 kg/m   Physical Exam   VITALS: BP- 170/58 GEN: No distress noted CHEST: Lungs clear to auscultation, no wheezing, crackles, or crepitus. CARDIOVASCULAR: Heart sounds normal, S1 and S2 normal, no murmurs.       ASSESSMENT/PLAN:   Assessment & Plan Diabetes mellitus type 2, controlled (HCC) Diabetes is well-controlled with current regimen. - Urine microalbumin checked Essential hypertension Systolic blood pressure remains elevated despite current medication regimen. Target is less than 130/80 mmHg. Diastolic BP is low. - She had been on Lisinopril /HCTZ20/12.5 mg 2 tab daily in the past. Uncertain if she takes one tabs or two  now and she did not come with her home bottles. - Take lisinopril  hydrochlorothiazide  to two tablets daily for now. Close home BP monitoring with parameters provided. - Referred to hypertension clinic for further management. - Scheduled follow-up appointment in two weeks. Stage 3b chronic kidney disease (CKD) (HCC) Concern for kidney health due to diabetes and hypertension. Monitoring for proteinuria is necessary. - Ordered urine test to check for Microalbumin in urine.     Otto Fairly, MD [facility]  "

## 2025-01-28 NOTE — Assessment & Plan Note (Addendum)
 Concern for kidney health due to diabetes and hypertension. Monitoring for proteinuria is necessary. - Ordered urine test to check for Microalbumin in urine.

## 2025-02-11 ENCOUNTER — Ambulatory Visit: Admitting: Family Medicine

## 2025-09-15 ENCOUNTER — Encounter
